# Patient Record
Sex: Female | Born: 1940 | Race: White | Hispanic: No | Marital: Married | Smoking: Current some day smoker
Health system: Southern US, Community
[De-identification: ages and names within clinical notes are randomized; demographics above are authoritative.]

## PROBLEM LIST (undated history)

## (undated) DIAGNOSIS — E278 Other specified disorders of adrenal gland: Secondary | ICD-10-CM

## (undated) DIAGNOSIS — IMO0002 Reserved for concepts with insufficient information to code with codable children: Secondary | ICD-10-CM

## (undated) DIAGNOSIS — I10 Essential (primary) hypertension: Secondary | ICD-10-CM

## (undated) DIAGNOSIS — J15212 Pneumonia due to Methicillin resistant Staphylococcus aureus: Secondary | ICD-10-CM

## (undated) DIAGNOSIS — C50911 Malignant neoplasm of unspecified site of right female breast: Secondary | ICD-10-CM

## (undated) DIAGNOSIS — C189 Malignant neoplasm of colon, unspecified: Secondary | ICD-10-CM

## (undated) DIAGNOSIS — E785 Hyperlipidemia, unspecified: Secondary | ICD-10-CM

## (undated) DIAGNOSIS — F419 Anxiety disorder, unspecified: Secondary | ICD-10-CM

## (undated) DIAGNOSIS — I509 Heart failure, unspecified: Secondary | ICD-10-CM

## (undated) DIAGNOSIS — J45909 Unspecified asthma, uncomplicated: Secondary | ICD-10-CM

## (undated) DIAGNOSIS — M810 Age-related osteoporosis without current pathological fracture: Secondary | ICD-10-CM

## (undated) DIAGNOSIS — C50919 Malignant neoplasm of unspecified site of unspecified female breast: Secondary | ICD-10-CM

## (undated) DIAGNOSIS — J449 Chronic obstructive pulmonary disease, unspecified: Secondary | ICD-10-CM

## (undated) DIAGNOSIS — J309 Allergic rhinitis, unspecified: Secondary | ICD-10-CM

## (undated) DIAGNOSIS — E538 Deficiency of other specified B group vitamins: Secondary | ICD-10-CM

## (undated) HISTORY — PX: ABDOMINAL HYSTERECTOMY: SHX81

## (undated) HISTORY — PX: IMAGE GUIDED SINUS SURGERY: SHX6570

## (undated) HISTORY — PX: BREAST SURGERY: SHX581

## (undated) HISTORY — PX: OTHER SURGICAL HISTORY: SHX169

## (undated) HISTORY — PX: APPENDECTOMY: SHX54

---

## 1997-11-18 DIAGNOSIS — C50911 Malignant neoplasm of unspecified site of right female breast: Secondary | ICD-10-CM

## 1997-11-18 DIAGNOSIS — C50919 Malignant neoplasm of unspecified site of unspecified female breast: Secondary | ICD-10-CM

## 1997-11-18 DIAGNOSIS — IMO0001 Reserved for inherently not codable concepts without codable children: Secondary | ICD-10-CM

## 1997-11-18 HISTORY — DX: Reserved for inherently not codable concepts without codable children: IMO0001

## 1997-11-18 HISTORY — DX: Malignant neoplasm of unspecified site of right female breast: C50.911

## 1997-11-18 HISTORY — DX: Malignant neoplasm of unspecified site of unspecified female breast: C50.919

## 2005-05-21 ENCOUNTER — Emergency Department: Payer: Self-pay | Admitting: Emergency Medicine

## 2007-04-15 ENCOUNTER — Ambulatory Visit: Payer: Self-pay | Admitting: Unknown Physician Specialty

## 2007-04-20 ENCOUNTER — Ambulatory Visit: Payer: Self-pay | Admitting: Unknown Physician Specialty

## 2007-05-05 ENCOUNTER — Ambulatory Visit: Payer: Self-pay | Admitting: Cardiology

## 2007-05-19 ENCOUNTER — Ambulatory Visit: Payer: Self-pay | Admitting: Oncology

## 2007-05-20 ENCOUNTER — Inpatient Hospital Stay: Payer: Self-pay | Admitting: Surgery

## 2007-06-05 ENCOUNTER — Ambulatory Visit: Payer: Self-pay | Admitting: Oncology

## 2007-06-19 ENCOUNTER — Ambulatory Visit: Payer: Self-pay | Admitting: Oncology

## 2007-06-29 ENCOUNTER — Ambulatory Visit: Payer: Self-pay | Admitting: Oncology

## 2007-07-16 ENCOUNTER — Ambulatory Visit: Payer: Self-pay | Admitting: Surgery

## 2007-07-20 ENCOUNTER — Ambulatory Visit: Payer: Self-pay | Admitting: Oncology

## 2007-08-19 ENCOUNTER — Ambulatory Visit: Payer: Self-pay | Admitting: Oncology

## 2007-09-19 ENCOUNTER — Ambulatory Visit: Payer: Self-pay | Admitting: Oncology

## 2007-10-01 ENCOUNTER — Ambulatory Visit: Payer: Self-pay | Admitting: Internal Medicine

## 2007-10-09 ENCOUNTER — Ambulatory Visit: Payer: Self-pay | Admitting: Internal Medicine

## 2007-11-17 ENCOUNTER — Ambulatory Visit: Payer: Self-pay | Admitting: Urology

## 2007-11-19 ENCOUNTER — Ambulatory Visit: Payer: Self-pay | Admitting: Oncology

## 2007-11-27 ENCOUNTER — Ambulatory Visit: Payer: Self-pay | Admitting: Oncology

## 2007-12-20 ENCOUNTER — Ambulatory Visit: Payer: Self-pay | Admitting: Oncology

## 2007-12-21 ENCOUNTER — Ambulatory Visit: Payer: Self-pay | Admitting: Oncology

## 2008-01-11 ENCOUNTER — Ambulatory Visit: Payer: Self-pay | Admitting: Internal Medicine

## 2008-01-17 ENCOUNTER — Ambulatory Visit: Payer: Self-pay | Admitting: Oncology

## 2008-03-18 ENCOUNTER — Ambulatory Visit: Payer: Self-pay | Admitting: Oncology

## 2008-03-21 ENCOUNTER — Ambulatory Visit: Payer: Self-pay | Admitting: Oncology

## 2008-04-18 ENCOUNTER — Ambulatory Visit: Payer: Self-pay | Admitting: Oncology

## 2008-07-11 ENCOUNTER — Ambulatory Visit: Payer: Self-pay | Admitting: Unknown Physician Specialty

## 2008-08-18 ENCOUNTER — Ambulatory Visit: Payer: Self-pay | Admitting: Oncology

## 2008-09-16 ENCOUNTER — Ambulatory Visit: Payer: Self-pay | Admitting: Oncology

## 2008-09-18 ENCOUNTER — Ambulatory Visit: Payer: Self-pay | Admitting: Oncology

## 2008-11-16 ENCOUNTER — Ambulatory Visit: Payer: Self-pay | Admitting: Cardiology

## 2008-11-18 DIAGNOSIS — C189 Malignant neoplasm of colon, unspecified: Secondary | ICD-10-CM

## 2008-11-18 HISTORY — DX: Malignant neoplasm of colon, unspecified: C18.9

## 2009-01-18 ENCOUNTER — Ambulatory Visit: Payer: Self-pay | Admitting: Oncology

## 2009-03-18 ENCOUNTER — Ambulatory Visit: Payer: Self-pay | Admitting: Oncology

## 2009-03-23 ENCOUNTER — Ambulatory Visit: Payer: Self-pay | Admitting: Oncology

## 2009-04-18 ENCOUNTER — Ambulatory Visit: Payer: Self-pay | Admitting: Oncology

## 2009-10-18 ENCOUNTER — Ambulatory Visit: Payer: Self-pay | Admitting: Oncology

## 2009-10-20 ENCOUNTER — Ambulatory Visit: Payer: Self-pay | Admitting: Oncology

## 2009-11-18 ENCOUNTER — Ambulatory Visit: Payer: Self-pay | Admitting: Oncology

## 2010-01-31 ENCOUNTER — Ambulatory Visit: Payer: Self-pay | Admitting: Internal Medicine

## 2010-02-10 ENCOUNTER — Inpatient Hospital Stay: Payer: Self-pay | Admitting: Specialist

## 2010-04-18 ENCOUNTER — Ambulatory Visit: Payer: Self-pay | Admitting: Oncology

## 2010-05-02 ENCOUNTER — Ambulatory Visit: Payer: Self-pay | Admitting: Oncology

## 2010-05-18 ENCOUNTER — Ambulatory Visit: Payer: Self-pay | Admitting: Oncology

## 2010-09-10 ENCOUNTER — Ambulatory Visit: Payer: Self-pay | Admitting: Unknown Physician Specialty

## 2010-09-12 LAB — PATHOLOGY REPORT

## 2010-09-24 ENCOUNTER — Inpatient Hospital Stay: Payer: Self-pay | Admitting: Internal Medicine

## 2010-12-14 ENCOUNTER — Ambulatory Visit: Payer: Self-pay | Admitting: Oncology

## 2010-12-19 ENCOUNTER — Ambulatory Visit: Payer: Self-pay | Admitting: Oncology

## 2011-02-05 ENCOUNTER — Ambulatory Visit: Payer: Self-pay | Admitting: Oncology

## 2011-06-14 ENCOUNTER — Ambulatory Visit: Payer: Self-pay | Admitting: Oncology

## 2011-06-19 ENCOUNTER — Ambulatory Visit: Payer: Self-pay | Admitting: Oncology

## 2011-08-29 ENCOUNTER — Emergency Department: Payer: Self-pay | Admitting: *Deleted

## 2011-08-30 ENCOUNTER — Inpatient Hospital Stay: Payer: Self-pay | Admitting: Internal Medicine

## 2012-01-14 ENCOUNTER — Ambulatory Visit: Payer: Self-pay | Admitting: Oncology

## 2012-01-14 LAB — CBC CANCER CENTER
Eosinophil #: 0.1 x10 3/mm (ref 0.0–0.7)
Eosinophil %: 1.8 %
MCH: 28.5 pg (ref 26.0–34.0)
MCHC: 33.9 g/dL (ref 32.0–36.0)
Monocyte #: 0.5 x10 3/mm (ref 0.0–0.7)
Neutrophil %: 54.6 %
Platelet: 245 x10 3/mm (ref 150–440)
RDW: 14.1 % (ref 11.5–14.5)
WBC: 7.8 x10 3/mm (ref 3.6–11.0)

## 2012-01-14 LAB — COMPREHENSIVE METABOLIC PANEL
Albumin: 3.7 g/dL (ref 3.4–5.0)
Alkaline Phosphatase: 101 U/L (ref 50–136)
Anion Gap: 10 (ref 7–16)
Calcium, Total: 9.1 mg/dL (ref 8.5–10.1)
Co2: 28 mmol/L (ref 21–32)
EGFR (African American): >60
EGFR (Non-African Amer.): 58 — ABNORMAL LOW
Glucose: 104 mg/dL — ABNORMAL HIGH (ref 65–99)
Osmolality: 279 (ref 275–301)
SGOT(AST): 17 U/L (ref 15–37)
Sodium: 140 mmol/L (ref 136–145)

## 2012-01-15 LAB — CEA: CEA: 2.7 ng/mL (ref 0.0–4.7)

## 2012-01-17 ENCOUNTER — Ambulatory Visit: Payer: Self-pay | Admitting: Oncology

## 2012-02-17 ENCOUNTER — Ambulatory Visit: Payer: Self-pay | Admitting: Oncology

## 2012-03-11 ENCOUNTER — Ambulatory Visit: Payer: Self-pay | Admitting: Cardiology

## 2012-04-03 ENCOUNTER — Inpatient Hospital Stay: Payer: Self-pay | Admitting: Internal Medicine

## 2012-04-03 LAB — BASIC METABOLIC PANEL
Co2: 26 mmol/L (ref 21–32)
Creatinine: 0.89 mg/dL (ref 0.60–1.30)
EGFR (African American): >60
Glucose: 104 mg/dL — ABNORMAL HIGH (ref 65–99)
Osmolality: 271 (ref 275–301)
Potassium: 4 mmol/L (ref 3.5–5.1)

## 2012-04-03 LAB — CBC WITH DIFFERENTIAL/PLATELET
Eosinophil %: 0.3 %
HCT: 32.7 % — ABNORMAL LOW (ref 35.0–47.0)
HGB: 11 g/dL — ABNORMAL LOW (ref 12.0–16.0)
Lymphocyte #: 1 10*3/uL (ref 1.0–3.6)
Lymphocyte %: 6 %
MCV: 86 fL (ref 80–100)
Monocyte #: 1.2 x10 3/mm — ABNORMAL HIGH (ref 0.2–0.9)
Monocyte %: 7.3 %
Neutrophil #: 13.7 10*3/uL — ABNORMAL HIGH (ref 1.4–6.5)
Platelet: 218 10*3/uL (ref 150–440)
RBC: 3.8 10*6/uL (ref 3.80–5.20)
WBC: 15.9 10*3/uL — ABNORMAL HIGH (ref 3.6–11.0)

## 2012-04-03 LAB — URINALYSIS, COMPLETE
Bacteria: NONE SEEN
Bilirubin,UR: NEGATIVE
Glucose,UR: NEGATIVE mg/dL (ref 0–75)
Ketone: NEGATIVE
Leukocyte Esterase: NEGATIVE
Protein: NEGATIVE
RBC,UR: NONE SEEN /HPF (ref 0–5)
Specific Gravity: 1.011 (ref 1.003–1.030)
WBC UR: NONE SEEN /HPF (ref 0–5)

## 2012-04-03 LAB — TROPONIN I: Troponin-I: <0.02 ng/mL

## 2012-04-04 LAB — CBC WITH DIFFERENTIAL/PLATELET
Basophil #: 0 10*3/uL (ref 0.0–0.1)
Basophil %: 0.1 %
Eosinophil #: 0 10*3/uL (ref 0.0–0.7)
Eosinophil %: 0 %
HCT: 31.7 % — ABNORMAL LOW (ref 35.0–47.0)
Lymphocyte %: 3.4 %
MCHC: 33.2 g/dL (ref 32.0–36.0)
MCV: 86 fL (ref 80–100)
Monocyte #: 0.2 x10 3/mm (ref 0.2–0.9)
Monocyte %: 1.4 %
Neutrophil #: 11.2 10*3/uL — ABNORMAL HIGH (ref 1.4–6.5)
Neutrophil %: 95.1 %
RDW: 14 % (ref 11.5–14.5)

## 2012-04-04 LAB — BASIC METABOLIC PANEL
Anion Gap: 9 (ref 7–16)
BUN: 14 mg/dL (ref 7–18)
Calcium, Total: 8.7 mg/dL (ref 8.5–10.1)
Chloride: 98 mmol/L (ref 98–107)
Co2: 27 mmol/L (ref 21–32)
Creatinine: 0.85 mg/dL (ref 0.60–1.30)
EGFR (African American): >60
EGFR (Non-African Amer.): >60
Glucose: 162 mg/dL — ABNORMAL HIGH (ref 65–99)
Sodium: 134 mmol/L — ABNORMAL LOW (ref 136–145)

## 2012-04-04 LAB — TROPONIN I
Troponin-I: <0.02 ng/mL
Troponin-I: <0.02 ng/mL

## 2012-04-09 LAB — CULTURE, BLOOD (SINGLE)

## 2012-07-22 ENCOUNTER — Ambulatory Visit: Payer: Self-pay | Admitting: Oncology

## 2012-07-22 LAB — COMPREHENSIVE METABOLIC PANEL
Albumin: 3.8 g/dL (ref 3.4–5.0)
Alkaline Phosphatase: 80 U/L (ref 50–136)
Calcium, Total: 9 mg/dL (ref 8.5–10.1)
Co2: 33 mmol/L — ABNORMAL HIGH (ref 21–32)
EGFR (Non-African Amer.): 57 — ABNORMAL LOW
Glucose: 97 mg/dL (ref 65–99)
Osmolality: 275 (ref 275–301)
SGOT(AST): 18 U/L (ref 15–37)
SGPT (ALT): 28 U/L (ref 12–78)
Sodium: 138 mmol/L (ref 136–145)
Total Protein: 7.3 g/dL (ref 6.4–8.2)

## 2012-07-22 LAB — CBC CANCER CENTER
Basophil #: 0.1 x10 3/mm (ref 0.0–0.1)
Basophil %: 0.7 %
Eosinophil #: 0.2 x10 3/mm (ref 0.0–0.7)
HCT: 41.8 % (ref 35.0–47.0)
Lymphocyte #: 2 x10 3/mm (ref 1.0–3.6)
Lymphocyte %: 19.7 %
MCH: 28.4 pg (ref 26.0–34.0)
MCHC: 32.7 g/dL (ref 32.0–36.0)
MCV: 87 fL (ref 80–100)
Monocyte %: 9.2 %
Neutrophil #: 7.1 x10 3/mm — ABNORMAL HIGH (ref 1.4–6.5)
RDW: 14.2 % (ref 11.5–14.5)

## 2012-07-24 LAB — CEA: CEA: 2.8 ng/mL (ref 0.0–4.7)

## 2012-08-18 ENCOUNTER — Ambulatory Visit: Payer: Self-pay | Admitting: Oncology

## 2012-08-31 ENCOUNTER — Ambulatory Visit: Payer: Self-pay | Admitting: Internal Medicine

## 2012-11-16 ENCOUNTER — Ambulatory Visit: Payer: Self-pay | Admitting: Physical Medicine and Rehabilitation

## 2013-01-28 ENCOUNTER — Inpatient Hospital Stay: Payer: Self-pay | Admitting: Internal Medicine

## 2013-01-28 LAB — COMPREHENSIVE METABOLIC PANEL
Alkaline Phosphatase: 98 U/L (ref 50–136)
BUN: 9 mg/dL (ref 7–18)
Bilirubin,Total: 0.2 mg/dL (ref 0.2–1.0)
Calcium, Total: 8.8 mg/dL (ref 8.5–10.1)
Chloride: 105 mmol/L (ref 98–107)
Creatinine: 1.12 mg/dL (ref 0.60–1.30)
EGFR (Non-African Amer.): 50 — ABNORMAL LOW
Glucose: 102 mg/dL — ABNORMAL HIGH (ref 65–99)
Potassium: 3.7 mmol/L (ref 3.5–5.1)
SGOT(AST): 19 U/L (ref 15–37)
SGPT (ALT): 21 U/L (ref 12–78)
Sodium: 141 mmol/L (ref 136–145)

## 2013-01-28 LAB — CBC
HGB: 14.8 g/dL (ref 12.0–16.0)
MCHC: 33.5 g/dL (ref 32.0–36.0)
Platelet: 243 10*3/uL (ref 150–440)
RBC: 5.22 10*6/uL — ABNORMAL HIGH (ref 3.80–5.20)
RDW: 14.2 % (ref 11.5–14.5)
WBC: 9.7 10*3/uL (ref 3.6–11.0)

## 2013-01-28 LAB — CK TOTAL AND CKMB (NOT AT ARMC): CK, Total: 61 U/L (ref 21–215)

## 2013-03-08 ENCOUNTER — Ambulatory Visit: Payer: Self-pay | Admitting: Otolaryngology

## 2013-03-10 LAB — PATHOLOGY REPORT

## 2013-06-01 ENCOUNTER — Ambulatory Visit: Payer: Self-pay | Admitting: Internal Medicine

## 2013-08-10 ENCOUNTER — Ambulatory Visit: Payer: Self-pay | Admitting: Oncology

## 2013-08-10 LAB — COMPREHENSIVE METABOLIC PANEL
Albumin: 3.7 g/dL (ref 3.4–5.0)
Anion Gap: 8 (ref 7–16)
BUN: 8 mg/dL (ref 7–18)
Calcium, Total: 9.3 mg/dL (ref 8.5–10.1)
Chloride: 101 mmol/L (ref 98–107)
Co2: 31 mmol/L (ref 21–32)
Creatinine: 0.93 mg/dL (ref 0.60–1.30)
EGFR (African American): >60
EGFR (Non-African Amer.): >60
Osmolality: 277 (ref 275–301)
Potassium: 4.2 mmol/L (ref 3.5–5.1)
Sodium: 140 mmol/L (ref 136–145)

## 2013-08-10 LAB — CBC CANCER CENTER
Basophil #: 0.1 x10 3/mm (ref 0.0–0.1)
Basophil %: 1.1 %
Eosinophil #: 0.2 x10 3/mm (ref 0.0–0.7)
Eosinophil %: 2.3 %
HCT: 44.1 % (ref 35.0–47.0)
Lymphocyte %: 29 %
MCHC: 33 g/dL (ref 32.0–36.0)
Monocyte #: 0.8 x10 3/mm (ref 0.2–0.9)
Monocyte %: 8.9 %
Platelet: 265 x10 3/mm (ref 150–440)
RBC: 5.23 10*6/uL — ABNORMAL HIGH (ref 3.80–5.20)

## 2013-08-12 LAB — CEA: CEA: 2.9 ng/mL (ref 0.0–4.7)

## 2013-08-18 ENCOUNTER — Ambulatory Visit: Payer: Self-pay | Admitting: Oncology

## 2013-09-15 ENCOUNTER — Ambulatory Visit: Payer: Self-pay | Admitting: Unknown Physician Specialty

## 2013-09-17 LAB — PATHOLOGY REPORT

## 2013-11-11 ENCOUNTER — Inpatient Hospital Stay: Payer: Self-pay | Admitting: Internal Medicine

## 2013-11-11 LAB — URINALYSIS, COMPLETE
Bilirubin,UR: NEGATIVE
Glucose,UR: NEGATIVE mg/dL (ref 0–75)
Nitrite: NEGATIVE
Ph: 5 (ref 4.5–8.0)
Protein: NEGATIVE
RBC,UR: <1 /HPF (ref 0–5)
WBC UR: 1 /HPF (ref 0–5)

## 2013-11-11 LAB — COMPREHENSIVE METABOLIC PANEL
Alkaline Phosphatase: 79 U/L
BUN: 13 mg/dL (ref 7–18)
Chloride: 100 mmol/L (ref 98–107)
Co2: 31 mmol/L (ref 21–32)
Creatinine: 1.15 mg/dL (ref 0.60–1.30)
EGFR (Non-African Amer.): 48 — ABNORMAL LOW
Glucose: 119 mg/dL — ABNORMAL HIGH (ref 65–99)
Potassium: 3.9 mmol/L (ref 3.5–5.1)
SGOT(AST): 23 U/L (ref 15–37)
SGPT (ALT): 18 U/L (ref 12–78)

## 2013-11-11 LAB — CBC WITH DIFFERENTIAL/PLATELET
Basophil #: 0.1 10*3/uL (ref 0.0–0.1)
Basophil %: 0.3 %
Eosinophil %: 0.5 %
HGB: 13.7 g/dL (ref 12.0–16.0)
Lymphocyte #: 1.3 10*3/uL (ref 1.0–3.6)
MCH: 28.1 pg (ref 26.0–34.0)
Monocyte %: 7.1 %
Neutrophil #: 18.9 10*3/uL — ABNORMAL HIGH (ref 1.4–6.5)
Neutrophil %: 86.1 %
WBC: 22 10*3/uL — ABNORMAL HIGH (ref 3.6–11.0)

## 2013-11-11 LAB — RAPID INFLUENZA A&B ANTIGENS

## 2013-11-11 LAB — TROPONIN I: Troponin-I: <0.02 ng/mL

## 2013-11-12 LAB — BASIC METABOLIC PANEL
Anion Gap: 3 — ABNORMAL LOW (ref 7–16)
BUN: 13 mg/dL (ref 7–18)
Calcium, Total: 8.7 mg/dL (ref 8.5–10.1)
Co2: 31 mmol/L (ref 21–32)
Creatinine: 0.94 mg/dL (ref 0.60–1.30)
EGFR (Non-African Amer.): >60
Osmolality: 266 (ref 275–301)
Sodium: 133 mmol/L — ABNORMAL LOW (ref 136–145)

## 2013-11-12 LAB — CBC WITH DIFFERENTIAL/PLATELET
Basophil #: 0.1 10*3/uL (ref 0.0–0.1)
Basophil %: 0.4 %
Eosinophil %: 0.1 %
HCT: 37.1 % (ref 35.0–47.0)
Lymphocyte #: 1.7 10*3/uL (ref 1.0–3.6)
MCH: 28.1 pg (ref 26.0–34.0)
MCV: 84 fL (ref 80–100)
Neutrophil #: 15.5 10*3/uL — ABNORMAL HIGH (ref 1.4–6.5)
Neutrophil %: 83.5 %
RBC: 4.42 10*6/uL (ref 3.80–5.20)

## 2013-11-13 LAB — CBC WITH DIFFERENTIAL/PLATELET
Basophil #: 0 x10 3/mm 3
Basophil %: 0.2 %
Eosinophil #: 0.1 x10 3/mm 3
Eosinophil %: 0.5 %
HCT: 36.7 %
HGB: 12.3 g/dL
Lymphocyte %: 11.5 %
Lymphs Abs: 1.5 x10 3/mm 3
MCH: 28.1 pg
MCHC: 33.5 g/dL
MCV: 84 fL
Monocyte #: 1.1 "x10 3/mm " — ABNORMAL HIGH
Monocyte %: 8.3 %
Neutrophil #: 10.4 x10 3/mm 3 — ABNORMAL HIGH
Neutrophil %: 79.5 %
Platelet: 233 x10 3/mm 3
RBC: 4.37 X10 6/mm 3
RDW: 14 %
WBC: 13 x10 3/mm 3 — ABNORMAL HIGH

## 2013-11-13 LAB — BASIC METABOLIC PANEL WITH GFR
Anion Gap: 2 — ABNORMAL LOW
BUN: 15 mg/dL
Calcium, Total: 8.8 mg/dL
Chloride: 99 mmol/L
Co2: 32 mmol/L
Creatinine: 0.81 mg/dL
EGFR (African American): >60
EGFR (Non-African Amer.): >60
Glucose: 101 mg/dL — ABNORMAL HIGH
Osmolality: 267
Potassium: 3.7 mmol/L
Sodium: 133 mmol/L — ABNORMAL LOW

## 2013-11-13 LAB — HCG, QUANTITATIVE, PREGNANCY: Beta Hcg, Quant.: 3 m[IU]/mL

## 2013-11-16 LAB — CULTURE, BLOOD (SINGLE)

## 2014-02-03 ENCOUNTER — Ambulatory Visit: Payer: Self-pay | Admitting: Cardiothoracic Surgery

## 2014-02-03 ENCOUNTER — Ambulatory Visit: Payer: Self-pay | Admitting: Oncology

## 2014-02-16 ENCOUNTER — Ambulatory Visit: Payer: Self-pay | Admitting: Oncology

## 2014-04-11 ENCOUNTER — Emergency Department: Payer: Self-pay | Admitting: Emergency Medicine

## 2014-04-11 LAB — CBC
HCT: 43 % (ref 35.0–47.0)
HGB: 14.5 g/dL (ref 12.0–16.0)
MCH: 28.7 pg (ref 26.0–34.0)
MCHC: 33.6 g/dL (ref 32.0–36.0)
MCV: 85 fL (ref 80–100)
Platelet: 222 10*3/uL (ref 150–440)
RBC: 5.04 10*6/uL (ref 3.80–5.20)
RDW: 14 % (ref 11.5–14.5)
WBC: 10.3 10*3/uL (ref 3.6–11.0)

## 2014-04-11 LAB — COMPREHENSIVE METABOLIC PANEL
ALK PHOS: 64 U/L
ALT: 21 U/L (ref 12–78)
ANION GAP: 5 — AB (ref 7–16)
Albumin: 3.9 g/dL (ref 3.4–5.0)
BILIRUBIN TOTAL: 0.6 mg/dL (ref 0.2–1.0)
BUN: 9 mg/dL (ref 7–18)
CALCIUM: 9 mg/dL (ref 8.5–10.1)
CHLORIDE: 100 mmol/L (ref 98–107)
CO2: 30 mmol/L (ref 21–32)
CREATININE: 0.76 mg/dL (ref 0.60–1.30)
GLUCOSE: 134 mg/dL — AB (ref 65–99)
Osmolality: 271 (ref 275–301)
POTASSIUM: 3.8 mmol/L (ref 3.5–5.1)
SGOT(AST): 28 U/L (ref 15–37)
Sodium: 135 mmol/L — ABNORMAL LOW (ref 136–145)
TOTAL PROTEIN: 7.4 g/dL (ref 6.4–8.2)

## 2014-04-11 LAB — URINALYSIS, COMPLETE
BILIRUBIN, UR: NEGATIVE
Bacteria: NONE SEEN
Glucose,UR: NEGATIVE mg/dL (ref 0–75)
Ketone: NEGATIVE
Leukocyte Esterase: NEGATIVE
NITRITE: NEGATIVE
PH: 5 (ref 4.5–8.0)
Protein: NEGATIVE
RBC,UR: 4 /HPF (ref 0–5)
Specific Gravity: 1.02 (ref 1.003–1.030)
WBC UR: 6 /HPF (ref 0–5)

## 2014-04-11 LAB — CK TOTAL AND CKMB (NOT AT ARMC)
CK, Total: 78 U/L
CK-MB: 1.5 ng/mL (ref 0.5–3.6)

## 2014-04-11 LAB — TROPONIN I

## 2014-07-27 ENCOUNTER — Ambulatory Visit: Payer: Self-pay | Admitting: Family Medicine

## 2014-08-24 ENCOUNTER — Ambulatory Visit: Payer: Self-pay | Admitting: Oncology

## 2014-08-24 LAB — COMPREHENSIVE METABOLIC PANEL
ALT: 23 U/L
AST: 17 U/L (ref 15–37)
Albumin: 3.7 g/dL (ref 3.4–5.0)
Alkaline Phosphatase: 69 U/L
Anion Gap: 6 — ABNORMAL LOW (ref 7–16)
BUN: 10 mg/dL (ref 7–18)
Bilirubin,Total: 0.2 mg/dL (ref 0.2–1.0)
CHLORIDE: 104 mmol/L (ref 98–107)
CO2: 31 mmol/L (ref 21–32)
CREATININE: 0.85 mg/dL (ref 0.60–1.30)
Calcium, Total: 9 mg/dL (ref 8.5–10.1)
EGFR (African American): >60
EGFR (Non-African Amer.): >60
GLUCOSE: 123 mg/dL — AB (ref 65–99)
Osmolality: 282 (ref 275–301)
Potassium: 4.2 mmol/L (ref 3.5–5.1)
Sodium: 141 mmol/L (ref 136–145)
TOTAL PROTEIN: 6.6 g/dL (ref 6.4–8.2)

## 2014-08-24 LAB — CBC CANCER CENTER
BASOS PCT: 1.1 %
Basophil #: 0.1 x10 3/mm (ref 0.0–0.1)
EOS ABS: 0.2 x10 3/mm (ref 0.0–0.7)
EOS PCT: 2.3 %
HCT: 42 % (ref 35.0–47.0)
HGB: 13.6 g/dL (ref 12.0–16.0)
LYMPHS PCT: 24.1 %
Lymphocyte #: 1.8 x10 3/mm (ref 1.0–3.6)
MCH: 28.1 pg (ref 26.0–34.0)
MCHC: 32.3 g/dL (ref 32.0–36.0)
MCV: 87 fL (ref 80–100)
MONOS PCT: 7.4 %
Monocyte #: 0.6 x10 3/mm (ref 0.2–0.9)
NEUTROS PCT: 65.1 %
Neutrophil #: 4.9 x10 3/mm (ref 1.4–6.5)
Platelet: 208 x10 3/mm (ref 150–440)
RBC: 4.83 10*6/uL (ref 3.80–5.20)
RDW: 13.9 % (ref 11.5–14.5)
WBC: 7.6 x10 3/mm (ref 3.6–11.0)

## 2014-08-25 LAB — CEA: CEA: 4.1 ng/mL (ref 0.0–4.7)

## 2014-09-14 ENCOUNTER — Ambulatory Visit: Payer: Self-pay | Admitting: Ophthalmology

## 2014-09-18 ENCOUNTER — Ambulatory Visit: Payer: Self-pay | Admitting: Oncology

## 2014-10-05 ENCOUNTER — Ambulatory Visit: Payer: Self-pay | Admitting: Ophthalmology

## 2014-12-15 ENCOUNTER — Observation Stay (HOSPITAL_COMMUNITY)
Admission: AD | Admit: 2014-12-15 | Discharge: 2014-12-15 | Disposition: A | Discharge status: Home / Self Care | Payer: PPO | Source: Other Acute Inpatient Hospital | Attending: Neurological Surgery | Admitting: Neurological Surgery

## 2014-12-15 ENCOUNTER — Emergency Department: Payer: Self-pay | Admitting: Internal Medicine

## 2014-12-15 ENCOUNTER — Encounter (HOSPITAL_COMMUNITY): Payer: Self-pay | Admitting: Neurological Surgery

## 2014-12-15 DIAGNOSIS — Y9289 Other specified places as the place of occurrence of the external cause: Secondary | ICD-10-CM | POA: Insufficient documentation

## 2014-12-15 DIAGNOSIS — S0990XA Unspecified injury of head, initial encounter: Secondary | ICD-10-CM | POA: Diagnosis present

## 2014-12-15 DIAGNOSIS — Y9389 Activity, other specified: Secondary | ICD-10-CM | POA: Diagnosis not present

## 2014-12-15 DIAGNOSIS — Y998 Other external cause status: Secondary | ICD-10-CM | POA: Insufficient documentation

## 2014-12-15 DIAGNOSIS — S0512XA Contusion of eyeball and orbital tissues, left eye, initial encounter: Secondary | ICD-10-CM | POA: Insufficient documentation

## 2014-12-15 DIAGNOSIS — W1809XA Striking against other object with subsequent fall, initial encounter: Secondary | ICD-10-CM | POA: Diagnosis not present

## 2014-12-15 LAB — COMPREHENSIVE METABOLIC PANEL
ANION GAP: 5 — AB (ref 7–16)
AST: 27 U/L (ref 15–37)
Albumin: 3.8 g/dL (ref 3.4–5.0)
Alkaline Phosphatase: 67 U/L (ref 46–116)
BUN: 10 mg/dL (ref 7–18)
Bilirubin,Total: 0.3 mg/dL (ref 0.2–1.0)
CALCIUM: 8.9 mg/dL (ref 8.5–10.1)
Chloride: 103 mmol/L (ref 98–107)
Co2: 32 mmol/L (ref 21–32)
Creatinine: 0.76 mg/dL (ref 0.60–1.30)
EGFR (African American): >60
EGFR (Non-African Amer.): >60
Glucose: 98 mg/dL (ref 65–99)
Osmolality: 278 (ref 275–301)
POTASSIUM: 3.9 mmol/L (ref 3.5–5.1)
SGPT (ALT): 20 U/L (ref 14–63)
Sodium: 140 mmol/L (ref 136–145)
Total Protein: 6.9 g/dL (ref 6.4–8.2)

## 2014-12-15 LAB — CBC
HCT: 43.4 % (ref 35.0–47.0)
HGB: 13.9 g/dL (ref 12.0–16.0)
MCH: 27.5 pg (ref 26.0–34.0)
MCHC: 31.9 g/dL — AB (ref 32.0–36.0)
MCV: 86 fL (ref 80–100)
Platelet: 213 10*3/uL (ref 150–440)
RBC: 5.03 10*6/uL (ref 3.80–5.20)
RDW: 14 % (ref 11.5–14.5)
WBC: 9.7 10*3/uL (ref 3.6–11.0)

## 2014-12-15 LAB — PROTIME-INR
INR: 0.9
Prothrombin Time: 12.5 secs (ref 11.5–14.7)

## 2014-12-15 NOTE — Progress Notes (Signed)
Patient discharged home with son and husband. Discharge instructions reviewed with patient. Patient and family state they understand both.

## 2014-12-15 NOTE — Discharge Instructions (Signed)
Concussion  A concussion, or closed-head injury, is a brain injury caused by a direct blow to the head or by a quick and sudden movement (jolt) of the head or neck. Concussions are usually not life-threatening. Even so, the effects of a concussion can be serious. If you have had a concussion before, you are more likely to experience concussion-like symptoms after a direct blow to the head.   CAUSES  · Direct blow to the head, such as from running into another player during a soccer game, being hit in a fight, or hitting your head on a hard surface.  · A jolt of the head or neck that causes the brain to move back and forth inside the skull, such as in a car crash.  SIGNS AND SYMPTOMS  The signs of a concussion can be hard to notice. Early on, they may be missed by you, family members, and health care providers. You may look fine but act or feel differently.  Symptoms are usually temporary, but they may last for days, weeks, or even longer. Some symptoms may appear right away while others may not show up for hours or days. Every head injury is different. Symptoms include:  · Mild to moderate headaches that will not go away.  · A feeling of pressure inside your head.  · Having more trouble than usual:  ¨ Learning or remembering things you have heard.  ¨ Answering questions.  ¨ Paying attention or concentrating.  ¨ Organizing daily tasks.  ¨ Making decisions and solving problems.  · Slowness in thinking, acting or reacting, speaking, or reading.  · Getting lost or being easily confused.  · Feeling tired all the time or lacking energy (fatigued).  · Feeling drowsy.  · Sleep disturbances.  ¨ Sleeping more than usual.  ¨ Sleeping less than usual.  ¨ Trouble falling asleep.  ¨ Trouble sleeping (insomnia).  · Loss of balance or feeling lightheaded or dizzy.  · Nausea or vomiting.  · Numbness or tingling.  · Increased sensitivity to:  ¨ Sounds.  ¨ Lights.  ¨ Distractions.  · Vision problems or eyes that tire  easily.  · Diminished sense of taste or smell.  · Ringing in the ears.  · Mood changes such as feeling sad or anxious.  · Becoming easily irritated or angry for little or no reason.  · Lack of motivation.  · Seeing or hearing things other people do not see or hear (hallucinations).  DIAGNOSIS  Your health care provider can usually diagnose a concussion based on a description of your injury and symptoms. He or she will ask whether you passed out (lost consciousness) and whether you are having trouble remembering events that happened right before and during your injury.  Your evaluation might include:  · A brain scan to look for signs of injury to the brain. Even if the test shows no injury, you may still have a concussion.  · Blood tests to be sure other problems are not present.  TREATMENT  · Concussions are usually treated in an emergency department, in urgent care, or at a clinic. You may need to stay in the hospital overnight for further treatment.  · Tell your health care provider if you are taking any medicines, including prescription medicines, over-the-counter medicines, and natural remedies. Some medicines, such as blood thinners (anticoagulants) and aspirin, may increase the chance of complications. Also tell your health care provider whether you have had alcohol or are taking illegal drugs. This information   may affect treatment.  · Your health care provider will send you home with important instructions to follow.  · How fast you will recover from a concussion depends on many factors. These factors include how severe your concussion is, what part of your brain was injured, your age, and how healthy you were before the concussion.  · Most people with mild injuries recover fully. Recovery can take time. In general, recovery is slower in older persons. Also, persons who have had a concussion in the past or have other medical problems may find that it takes longer to recover from their current injury.  HOME  CARE INSTRUCTIONS  General Instructions  · Carefully follow the directions your health care provider gave you.  · Only take over-the-counter or prescription medicines for pain, discomfort, or fever as directed by your health care provider.  · Take only those medicines that your health care provider has approved.  · Do not drink alcohol until your health care provider says you are well enough to do so. Alcohol and certain other drugs may slow your recovery and can put you at risk of further injury.  · If it is harder than usual to remember things, write them down.  · If you are easily distracted, try to do one thing at a time. For example, do not try to watch TV while fixing dinner.  · Talk with family members or close friends when making important decisions.  · Keep all follow-up appointments. Repeated evaluation of your symptoms is recommended for your recovery.  · Watch your symptoms and tell others to do the same. Complications sometimes occur after a concussion. Older adults with a brain injury may have a higher risk of serious complications, such as a blood clot on the brain.  · Tell your teachers, school nurse, school counselor, coach, athletic trainer, or work manager about your injury, symptoms, and restrictions. Tell them about what you can or cannot do. They should watch for:  ¨ Increased problems with attention or concentration.  ¨ Increased difficulty remembering or learning new information.  ¨ Increased time needed to complete tasks or assignments.  ¨ Increased irritability or decreased ability to cope with stress.  ¨ Increased symptoms.  · Rest. Rest helps the brain to heal. Make sure you:  ¨ Get plenty of sleep at night. Avoid staying up late at night.  ¨ Keep the same bedtime hours on weekends and weekdays.  ¨ Rest during the day. Take daytime naps or rest breaks when you feel tired.  · Limit activities that require a lot of thought or concentration. These include:  ¨ Doing homework or job-related  work.  ¨ Watching TV.  ¨ Working on the computer.  · Avoid any situation where there is potential for another head injury (football, hockey, soccer, basketball, martial arts, downhill snow sports and horseback riding). Your condition will get worse every time you experience a concussion. You should avoid these activities until you are evaluated by the appropriate follow-up health care providers.  Returning To Your Regular Activities  You will need to return to your normal activities slowly, not all at once. You must give your body and brain enough time for recovery.  · Do not return to sports or other athletic activities until your health care provider tells you it is safe to do so.  · Ask your health care provider when you can drive, ride a bicycle, or operate heavy machinery. Your ability to react may be slower after a   brain injury. Never do these activities if you are dizzy.  · Ask your health care provider about when you can return to work or school.  Preventing Another Concussion  It is very important to avoid another brain injury, especially before you have recovered. In rare cases, another injury can lead to permanent brain damage, brain swelling, or death. The risk of this is greatest during the first 7-10 days after a head injury. Avoid injuries by:  · Wearing a seat belt when riding in a car.  · Drinking alcohol only in moderation.  · Wearing a helmet when biking, skiing, skateboarding, skating, or doing similar activities.  · Avoiding activities that could lead to a second concussion, such as contact or recreational sports, until your health care provider says it is okay.  · Taking safety measures in your home.  ¨ Remove clutter and tripping hazards from floors and stairways.  ¨ Use grab bars in bathrooms and handrails by stairs.  ¨ Place non-slip mats on floors and in bathtubs.  ¨ Improve lighting in dim areas.  SEEK MEDICAL CARE IF:  · You have increased problems paying attention or  concentrating.  · You have increased difficulty remembering or learning new information.  · You need more time to complete tasks or assignments than before.  · You have increased irritability or decreased ability to cope with stress.  · You have more symptoms than before.  Seek medical care if you have any of the following symptoms for more than 2 weeks after your injury:  · Lasting (chronic) headaches.  · Dizziness or balance problems.  · Nausea.  · Vision problems.  · Increased sensitivity to noise or light.  · Depression or mood swings.  · Anxiety or irritability.  · Memory problems.  · Difficulty concentrating or paying attention.  · Sleep problems.  · Feeling tired all the time.  SEEK IMMEDIATE MEDICAL CARE IF:  · You have severe or worsening headaches. These may be a sign of a blood clot in the brain.  · You have weakness (even if only in one hand, leg, or part of the face).  · You have numbness.  · You have decreased coordination.  · You vomit repeatedly.  · You have increased sleepiness.  · One pupil is larger than the other.  · You have convulsions.  · You have slurred speech.  · You have increased confusion. This may be a sign of a blood clot in the brain.  · You have increased restlessness, agitation, or irritability.  · You are unable to recognize people or places.  · You have neck pain.  · It is difficult to wake you up.  · You have unusual behavior changes.  · You lose consciousness.  MAKE SURE YOU:  · Understand these instructions.  · Will watch your condition.  · Will get help right away if you are not doing well or get worse.  Document Released: 01/25/2004 Document Revised: 11/09/2013 Document Reviewed: 05/27/2013  [employer]® Patient Information ©2015 [employer]. This information is not intended to replace advice given to you by your health care provider. Make sure you discuss any questions you have with your health care provider.

## 2014-12-15 NOTE — Progress Notes (Signed)
MD notified patient has arrived on the unit.

## 2014-12-15 NOTE — H&P (Signed)
Reason for Consult:[facility] Referring Physician: EDP  MYRIAH BOGGUS is an 74 y.o. female.   HPI:  74 year old white female transferred from [facility] for further evaluation and management of a closed head injury suffered during a fall today. She went out to get the paper this morning and tripped over a rug coming back in her screened and porch and struck the left side of her face. No loss of consciousness. No nausea and vomiting. No double vision. No numbness tingling or weakness. She was evaluated at [facility] where she was found to have trace subdural blood along the right tentorium. I was able to evaluate her films and discuss her case with the emergency department physician. She was transferred for evaluation since the emergency department physician felt that the hospitalist there would not evaluate and potentially admit her for observation. She has some left frontal headache that appears to be mild aching and not progressive.   History reviewed. No pertinent past medical history.  History reviewed. No pertinent past surgical history.  Not on File  History  Substance Use Topics  . Smoking status: Not on file  . Smokeless tobacco: Not on file  . Alcohol Use: Not on file    History reviewed. No pertinent family history.   Review of Systems  Positive ROS: neg  All other systems have been reviewed and were otherwise negative with the exception of those mentioned in the HPI and as above.  Objective: Vital signs in last 24 hours: Temp:  [98.4 F (36.9 C)] 98.4 F (36.9 C) (01/28 1659) Pulse Rate:  [78] 78 (01/28 1659) Resp:  [18] 18 (01/28 1659) BP: (159)/(61) 159/61 mmHg (01/28 1659) SpO2:  [90 %] 90 % (01/28 1659)  General Appearance: Alert, cooperative, no distress, appears stated age Head: Normocephalic, left frontal cephalhematoma with ecchymosis around the left eye Eyes: PERRL, conjunctiva/corneas clear, EOM's intact     Neck: Supple,  symmetrical, trachea midline, nontender to palpation Lungs:  respirations unlabored Extremities: Extremities normal, atraumatic, no cyanosis or edema   NEUROLOGIC:   Mental status: A&O x4, no aphasia, good attention span, Memory and fund of knowledge are normal Motor Exam - grossly normal, normal tone and bulk Sensory Exam - grossly normal Reflexes: symmetric, no pathologic reflexes Coordination - grossly normal Gait - grossly normal Balance - not tested Cranial Nerves: I: smell Not tested  II: visual acuity  OS: na    OD: na  II: visual fields Full to confrontation  II: pupils Equal, round, reactive to light  III,VII: ptosis None  III,IV,VI: extraocular muscles  Full ROM  V: mastication Normal  V: facial light touch sensation  Normal  V,VII: corneal reflex  Present  VII: facial muscle function - upper  Normal  VII: facial muscle function - lower Normal  VIII: hearing Not tested  IX: soft palate elevation  Normal  IX,X: gag reflex Present  XI: trapezius strength  5/5  XI: sternocleidomastoid strength 5/5  XI: neck flexion strength  5/5  XII: tongue strength  Normal    Data Review No results found for: WBC, HGB, HCT, MCV, PLT No results found for: NA, K, CL, CO2, BUN, CREATININE, GLUCOSE No results found for: INR, PROTIME  Radiology: No results found. CT scan of the head shows trace hyperdensity along the edge of the tentorium on the right. Basal cisterns are open. No skull fracture. No axial fluid collection. No mass effect or shift or edema.  Assessment/Plan: Mild closed head injury  with trace blood along the right tentorium without mass effect and a normal neurologic exam. She has a son and a husband who is willing to watch over her 24 hours a day for the next 24-48 hours. I have discussed the findings on the films with them. I do not believe she needs admission to the hospital for observation. I think the chance of significant worsening or enlargement of this tentorial  subdural blood is extremely small. She potentially could develop contusions of the frontal or temporal lobe or a chronic subdural hematoma and I think every neurosurgeon has seen this in their career though we usually see this as a later sequelae of the injury when a patient returns for further workup... So I cannot promise her that she will not develop one of these lesions but I think the likelihood is small and the risk to her is quite small. I discussed concussion and the symptoms of postconcussive syndrome. She or her family is to call should she have worsening headache or nausea and vomiting or visual changes or change in mental status. I do not believe she needs repeat imaging unless she has some type of change.   Cadarius Nevares S 12/15/2014 5:37 PM

## 2014-12-15 NOTE — Discharge Summary (Signed)
Physician Discharge Summary  Patient ID: LASHEBA STEVENS MRN: [id number] DOB/AGE: September 09, 1941 74 y.o.  Admit date: 12/15/2014 Discharge date: 12/15/2014  Admission Diagnoses: Closed head injury    Discharge Diagnoses: Same   Discharged Condition: stable  Hospital Course: The patient was admitted on 12/15/2014 for evaluation of a closed head injury suffered during a non-syncopal fall in the morning. Her head CT showed minimal blood along the right tentorium without mass effect or shift. Her neurologic exam was normal. She had mild headache in the left frontal region. She had ecchymosis around the left eye. I discussed her situation and her films and reviewed the films with her for my partners and we all agreed that she did not necessarily need admission to the hospital for observation. We felt comfortable allowing her to go home with her family who agreed to watch over her. We felt like that the risk of change in the imaging findings was minimal and she was safe for discharge home. She felt very comfortable with this though we offered to let her stay the night since she was transferred here for observation. Shearer family are to call should she have any change in her condition or change in mental status. We discussed concussion, its symptoms and its management. She and her family are comfortable with discharge tonight from the hospital.  Consults: None  Significant Diagnostic Studies:  No results found for this or any previous visit.  No results found.  Antibiotics:  Anti-infectives    None      Discharge Exam: Blood pressure 159/61, pulse 78, temperature 98.4 F (36.9 C), temperature source Oral, resp. rate 18, SpO2 90 %. Neurologic: Grossly normal Ecchymosis around left eye  Discharge Medications:     Medication List    Notice    You have not been prescribed any medications.      Disposition: Home   Final Dx: Closed head injury      Discharge Instructions    Call MD  for:  difficulty breathing, headache or visual disturbances    Complete by:  As directed      Call MD for:  persistant dizziness or light-headedness    Complete by:  As directed      Call MD for:  persistant nausea and vomiting    Complete by:  As directed      Call MD for:  severe uncontrolled pain    Complete by:  As directed      Call MD for:  temperature >100.4    Complete by:  As directed      Diet - low sodium heart healthy    Complete by:  As directed      Discharge instructions    Complete by:  As directed   No strenuous activity until symptoms clear     Increase activity slowly    Complete by:  As directed            Follow-up Information    Follow up with Eustace Moore, MD.   Specialty:  Neurosurgery   Why:  As needed   Contact information:   [address] [place] [place] [address] [phone]        Signed: Eustace Moore 12/15/2014, 5:49 PM

## 2015-01-15 ENCOUNTER — Emergency Department: Payer: Self-pay | Admitting: Emergency Medicine

## 2015-01-17 ENCOUNTER — Ambulatory Visit: Payer: Self-pay | Admitting: Physical Medicine and Rehabilitation

## 2015-02-09 ENCOUNTER — Ambulatory Visit: Payer: Self-pay | Admitting: Internal Medicine

## 2015-02-18 ENCOUNTER — Emergency Department
Admit: 2015-02-18 | Disposition: A | Discharge status: Home / Self Care | Payer: Self-pay | Admitting: Emergency Medicine

## 2015-02-18 LAB — COMPREHENSIVE METABOLIC PANEL
ALBUMIN: 4.5 g/dL
ALK PHOS: 57 U/L
ANION GAP: 10 (ref 7–16)
BILIRUBIN TOTAL: 1 mg/dL
BUN: 12 mg/dL
CALCIUM: 9.5 mg/dL
CHLORIDE: 94 mmol/L — AB
CREATININE: 0.64 mg/dL
Co2: 27 mmol/L
EGFR (African American): >60
EGFR (Non-African Amer.): >60
Glucose: 122 mg/dL — ABNORMAL HIGH
Potassium: 3.6 mmol/L
SGOT(AST): 20 U/L
SGPT (ALT): 13 U/L — ABNORMAL LOW
Sodium: 131 mmol/L — ABNORMAL LOW
TOTAL PROTEIN: 7.3 g/dL

## 2015-02-18 LAB — URINALYSIS, COMPLETE
BACTERIA: NONE SEEN
Bilirubin,UR: NEGATIVE
Blood: NEGATIVE
Glucose,UR: NEGATIVE mg/dL (ref 0–75)
Leukocyte Esterase: NEGATIVE
Nitrite: NEGATIVE
PH: 8 (ref 4.5–8.0)
Protein: NEGATIVE
Specific Gravity: 1.008 (ref 1.003–1.030)

## 2015-02-18 LAB — CBC
HCT: 46.2 % (ref 35.0–47.0)
HGB: 15.5 g/dL (ref 12.0–16.0)
MCH: 27.8 pg (ref 26.0–34.0)
MCHC: 33.6 g/dL (ref 32.0–36.0)
MCV: 83 fL (ref 80–100)
PLATELETS: 314 10*3/uL (ref 150–440)
RBC: 5.58 10*6/uL — ABNORMAL HIGH (ref 3.80–5.20)
RDW: 13.8 % (ref 11.5–14.5)
WBC: 13.1 10*3/uL — ABNORMAL HIGH (ref 3.6–11.0)

## 2015-02-18 LAB — LIPASE, BLOOD: LIPASE: 46 U/L

## 2015-03-01 ENCOUNTER — Emergency Department
Admit: 2015-03-01 | Disposition: A | Discharge status: Home / Self Care | Payer: Self-pay | Admitting: Emergency Medicine

## 2015-03-01 LAB — BASIC METABOLIC PANEL
Anion Gap: 10 (ref 7–16)
BUN: 14 mg/dL
CHLORIDE: 93 mmol/L — AB
Calcium, Total: 9 mg/dL
Co2: 30 mmol/L
Creatinine: 0.77 mg/dL
EGFR (African American): >60
EGFR (Non-African Amer.): >60
Glucose: 116 mg/dL — ABNORMAL HIGH
POTASSIUM: 4.1 mmol/L
Sodium: 133 mmol/L — ABNORMAL LOW

## 2015-03-01 LAB — CBC
HCT: 41.1 % (ref 35.0–47.0)
HGB: 13.2 g/dL (ref 12.0–16.0)
MCH: 27 pg (ref 26.0–34.0)
MCHC: 32.1 g/dL (ref 32.0–36.0)
MCV: 84 fL (ref 80–100)
Platelet: 164 10*3/uL (ref 150–440)
RBC: 4.89 10*6/uL (ref 3.80–5.20)
RDW: 13.8 % (ref 11.5–14.5)
WBC: 14.9 10*3/uL — ABNORMAL HIGH (ref 3.6–11.0)

## 2015-03-01 LAB — TROPONIN I: Troponin-I: <0.03 ng/mL

## 2015-03-06 ENCOUNTER — Inpatient Hospital Stay
Admit: 2015-03-06 | Disposition: A | Discharge status: Home / Self Care | Payer: Self-pay | Attending: Internal Medicine | Admitting: Internal Medicine

## 2015-03-06 LAB — CBC
HCT: 36.4 % (ref 35.0–47.0)
HGB: 11.6 g/dL — AB (ref 12.0–16.0)
MCH: 27.1 pg (ref 26.0–34.0)
MCHC: 32 g/dL (ref 32.0–36.0)
MCV: 85 fL (ref 80–100)
PLATELETS: 307 10*3/uL (ref 150–440)
RBC: 4.3 10*6/uL (ref 3.80–5.20)
RDW: 14.2 % (ref 11.5–14.5)
WBC: 25.3 10*3/uL — AB (ref 3.6–11.0)

## 2015-03-06 LAB — COMPREHENSIVE METABOLIC PANEL
ALBUMIN: 2.2 g/dL — AB
ALK PHOS: 75 U/L
ANION GAP: 8 (ref 7–16)
BILIRUBIN TOTAL: 0.9 mg/dL
BUN: 25 mg/dL — ABNORMAL HIGH
CO2: 30 mmol/L
CREATININE: 0.75 mg/dL
Calcium, Total: 8.1 mg/dL — ABNORMAL LOW
Chloride: 96 mmol/L — ABNORMAL LOW
EGFR (Non-African Amer.): >60
GLUCOSE: 95 mg/dL
Potassium: 3.7 mmol/L
SGOT(AST): 15 U/L
SGPT (ALT): 11 U/L — ABNORMAL LOW
Sodium: 134 mmol/L — ABNORMAL LOW
TOTAL PROTEIN: 5.9 g/dL — AB

## 2015-03-06 LAB — TROPONIN I
Troponin-I: 0.1 ng/mL — ABNORMAL HIGH
Troponin-I: 0.08 ng/mL — ABNORMAL HIGH

## 2015-03-06 LAB — CK-MB
CK-MB: 5.1 ng/mL — ABNORMAL HIGH
CK-MB: 9.4 ng/mL — ABNORMAL HIGH
CK-MB: 7.4 ng/mL — ABNORMAL HIGH

## 2015-03-06 LAB — LACTIC ACID, PLASMA: Lactic Acid, Venous: 1.3 mmol/L

## 2015-03-07 LAB — CBC WITH DIFFERENTIAL/PLATELET
Basophil #: 0 10*3/uL (ref 0.0–0.1)
Basophil %: 0.1 %
EOS ABS: 0 10*3/uL (ref 0.0–0.7)
Eosinophil %: 0 %
HCT: 33.2 % — AB (ref 35.0–47.0)
HGB: 10.8 g/dL — AB (ref 12.0–16.0)
LYMPHS ABS: 0.3 10*3/uL — AB (ref 1.0–3.6)
Lymphocyte %: 1.5 %
MCH: 27.3 pg (ref 26.0–34.0)
MCHC: 32.6 g/dL (ref 32.0–36.0)
MCV: 84 fL (ref 80–100)
Monocyte #: 0.9 x10 3/mm (ref 0.2–0.9)
Monocyte %: 3.9 %
Neutrophil #: 20.8 10*3/uL — ABNORMAL HIGH (ref 1.4–6.5)
Neutrophil %: 94.5 %
Platelet: 318 10*3/uL (ref 150–440)
RBC: 3.97 10*6/uL (ref 3.80–5.20)
RDW: 13.9 % (ref 11.5–14.5)
WBC: 22 10*3/uL — ABNORMAL HIGH (ref 3.6–11.0)

## 2015-03-07 LAB — MAGNESIUM: MAGNESIUM: 1.9 mg/dL

## 2015-03-07 LAB — BASIC METABOLIC PANEL
Anion Gap: 3 — ABNORMAL LOW (ref 7–16)
BUN: 18 mg/dL
Calcium, Total: 8.1 mg/dL — ABNORMAL LOW
Chloride: 100 mmol/L — ABNORMAL LOW
Co2: 32 mmol/L
Creatinine: 0.57 mg/dL
EGFR (African American): >60
EGFR (Non-African Amer.): >60
GLUCOSE: 155 mg/dL — AB
Potassium: 3.3 mmol/L — ABNORMAL LOW
Sodium: 135 mmol/L

## 2015-03-08 LAB — CBC WITH DIFFERENTIAL/PLATELET
Basophil #: 0.1 10*3/uL (ref 0.0–0.1)
Basophil %: 0.4 %
Eosinophil #: 0 10*3/uL (ref 0.0–0.7)
Eosinophil %: 0 %
HCT: 32.5 % — AB (ref 35.0–47.0)
HGB: 10.5 g/dL — ABNORMAL LOW (ref 12.0–16.0)
Lymphocyte #: 0.5 10*3/uL — ABNORMAL LOW (ref 1.0–3.6)
Lymphocyte %: 2.2 %
MCH: 27.1 pg (ref 26.0–34.0)
MCHC: 32.3 g/dL (ref 32.0–36.0)
MCV: 84 fL (ref 80–100)
MONOS PCT: 3 %
Monocyte #: 0.8 x10 3/mm (ref 0.2–0.9)
NEUTROS ABS: 23.6 10*3/uL — AB (ref 1.4–6.5)
NEUTROS PCT: 94.4 %
Platelet: 374 10*3/uL (ref 150–440)
RBC: 3.87 10*6/uL (ref 3.80–5.20)
RDW: 14 % (ref 11.5–14.5)
WBC: 25 10*3/uL — AB (ref 3.6–11.0)

## 2015-03-08 LAB — BASIC METABOLIC PANEL
Anion Gap: 2 — ABNORMAL LOW (ref 7–16)
BUN: 18 mg/dL
CHLORIDE: 102 mmol/L
CREATININE: 0.6 mg/dL
Calcium, Total: 8.5 mg/dL — ABNORMAL LOW
Co2: 34 mmol/L — ABNORMAL HIGH
EGFR (African American): >60
Glucose: 152 mg/dL — ABNORMAL HIGH
POTASSIUM: 3.5 mmol/L
Sodium: 138 mmol/L

## 2015-03-09 LAB — CBC WITH DIFFERENTIAL/PLATELET
BASOS PCT: 0.2 %
Basophil #: 0 10*3/uL (ref 0.0–0.1)
EOS PCT: 0 %
Eosinophil #: 0 10*3/uL (ref 0.0–0.7)
HCT: 33.2 % — AB (ref 35.0–47.0)
HGB: 10.6 g/dL — ABNORMAL LOW (ref 12.0–16.0)
LYMPHS PCT: 5 %
Lymphocyte #: 0.9 10*3/uL — ABNORMAL LOW (ref 1.0–3.6)
MCH: 26.8 pg (ref 26.0–34.0)
MCHC: 32 g/dL (ref 32.0–36.0)
MCV: 84 fL (ref 80–100)
Monocyte #: 0.9 x10 3/mm (ref 0.2–0.9)
Monocyte %: 4.7 %
NEUTROS ABS: 17.1 10*3/uL — AB (ref 1.4–6.5)
Neutrophil %: 90.1 %
Platelet: 361 10*3/uL (ref 150–440)
RBC: 3.95 10*6/uL (ref 3.80–5.20)
RDW: 14.1 % (ref 11.5–14.5)
WBC: 19 10*3/uL — AB (ref 3.6–11.0)

## 2015-03-09 LAB — BASIC METABOLIC PANEL
Anion Gap: 6 — ABNORMAL LOW (ref 7–16)
BUN: 20 mg/dL
CO2: 35 mmol/L — AB
Calcium, Total: 8.6 mg/dL — ABNORMAL LOW
Chloride: 99 mmol/L — ABNORMAL LOW
Creatinine: 0.56 mg/dL
EGFR (Non-African Amer.): >60
GLUCOSE: 104 mg/dL — AB
Potassium: 4.2 mmol/L
Sodium: 140 mmol/L

## 2015-03-09 LAB — VANCOMYCIN, TROUGH: VANCOMYCIN, TROUGH: 13 ug/mL

## 2015-03-10 LAB — CBC WITH DIFFERENTIAL/PLATELET
Basophil #: 0 10*3/uL (ref 0.0–0.1)
Basophil %: 0.3 %
EOS PCT: 0.1 %
Eosinophil #: 0 10*3/uL (ref 0.0–0.7)
HCT: 35.1 % (ref 35.0–47.0)
HGB: 11.1 g/dL — ABNORMAL LOW (ref 12.0–16.0)
LYMPHS PCT: 8.3 %
Lymphocyte #: 1.5 10*3/uL (ref 1.0–3.6)
MCH: 26.6 pg (ref 26.0–34.0)
MCHC: 31.7 g/dL — ABNORMAL LOW (ref 32.0–36.0)
MCV: 84 fL (ref 80–100)
MONOS PCT: 4.6 %
Monocyte #: 0.8 x10 3/mm (ref 0.2–0.9)
NEUTROS ABS: 15.5 10*3/uL — AB (ref 1.4–6.5)
Neutrophil %: 87.7 %
Platelet: 397 10*3/uL (ref 150–440)
RBC: 4.18 10*6/uL (ref 3.80–5.20)
RDW: 14.3 % (ref 11.5–14.5)
WBC: 17.7 10*3/uL — AB (ref 3.6–11.0)

## 2015-03-10 NOTE — H&P (Signed)
PATIENT NAME:  Robin Hudson, Robin Hudson MR#:  [id number] DATE OF BIRTH:  1941-06-22  DATE OF ADMISSION:  01/28/2013  PRIMARY CARE PHYSICIAN: Apolonio Schneiders, MD  REFERRING PHYSICIAN: Lavonia Drafts, MD  CHIEF COMPLAINT: Increased shortness of breath.   HISTORY OF PRESENT ILLNESS: The patient is a 74 year old pleasant Caucasian female with history of chronic obstructive pulmonary disease. She was in her usual state of health until about a week ago. After taking a shower, she could not get her breath and felt progressively worse. The following day, on Wednesday, she went to the walk-in clinic and she was placed on Augmentin; however, symptoms progressed and did not feel better. Primarily she is short of breath, has wheezing, feels tight in her chest, sleepy and tired today. She reported low-grade fever earlier in the week. Denies any cough. No sputum production. No chest pain. The patient was evaluated at the Emergency Room received breathing treatment; however, she had only partial improvement. The patient was admitted with diagnosis of acute exacerbation of chronic obstructive pulmonary disease for further evaluation and treatment.   REVIEW OF SYSTEMS: CONSTITUTIONAL: Denies any fever, only earlier this week she had low-grade fever. No chills, but she has fatigue.  EYES: No blurring of vision. No double vision.  ENT: No hearing impairment. No sore throat. No dysphagia.  CARDIOVASCULAR: No chest pain, but she has shortness of breath and wheezing. No edema. No syncope.  RESPIRATORY: Reports shortness of breath and wheezing. No hemoptysis. No chest pain.  GASTROINTESTINAL: No abdominal pain, no vomiting, no diarrhea.  GENITOURINARY: No dysuria and no frequency of urination.  MUSCULOSKELETAL: No joint pain or swelling. No muscular pain or swelling.  INTEGUMENTARY: No skin rash. No ulcers.  NEUROLOGY: No focal weakness. No seizure activity. No headache.  PSYCHIATRY: No anxiety. No depression.  ENDOCRINE: No  polyuria or polydipsia. No heat or cold intolerance.  HEMATOLOGY: No easy bruisability. No lymph node enlargement.   PAST MEDICAL HISTORY:  Chronic obstructive pulmonary disease, systemic hypertension, hyperlipidemia, history of breast cancer status post chemotherapy and radiation, history of colon cancer status post resection in 2008, followed up by Dr. Oliva Bustard. Right adrenal mass. Osteoporosis.   PAST SURGICAL HISTORY: Colon cancer resection in 2008. Appendectomy, hysterectomy, cardiac catheterization by Dr. Ubaldo Glassing in April 2013 showing ejection fraction of 45%, but no significant coronary artery disease.   SOCIAL HABITS: Chronic smoker. She started smoking in her 65s and then she quit for a few years and then back smoking again, averages 1 pack a day. However lately, she cut down to maybe a few cigarettes a week. No history of alcohol or drug abuse.   SOCIAL HISTORY: She is married, living with her husband and she is married for the last 25 years.   FAMILY HISTORY: Her father died from complications of stroke. Mother died at the age of 64 after having a heart attack and she had a brother who died from stroke.   ADMISSION MEDICATIONS: Ventilation inhaler 2 puffs 4 times a day p.r.n., vitamin D3 1000 units once a day, simvastatin 20 mg this is taken 3 times a week. Losartan 50 mg once a day, DuoNeb p.r.n. Augmentin just started a few days ago 875 mg twice a day, atenolol 50 mg once a day, alprazolam 0.5 mg twice a day p.r.n., Advair Diskus 250, 1 puff twice a day, acetaminophen p.r.n.   ALLERGIES: ACE INHIBITOR, COREG, STATIN,    PHYSICAL EXAMINATION: VITAL SIGNS: Blood pressure 153/76, respiratory rate 22, pulse 100, temperature 97.7, oxygen saturation  was 95%. It is unclear if this was on oxygen or not.  GENERAL: This is an elderly female lying in bed in no acute distress.  HEAD AND NECK: No pallor. No icterus. No cyanosis.  ENT: Ear examination revealed normal hearing, no discharge, no lesions.  No ulcers. Examination of the nose showed no ulcers, no discharge, no bleeding. Oropharyngeal area showed no ulcers, no oral thrush. She is edentulous and has dentures. Eye examination revealed normal eyelids and conjunctivae. Pupils are round, equal, about 5 mm, reactive to light.   NECK: Supple. Trachea at midline. No thyromegaly. No cervical lymphadenopathy. No masses.  HEART: Normal S1, S2. No S3, S4. No murmur. No gallop. No carotid bruits.  LUNGS: Normal breathing pattern without use of accessory muscles. No rales, but she has a few scattered wheezing and prolonged expiratory phase.  ABDOMEN: Soft without tenderness. No hepatosplenomegaly. No masses. No hernias.  SKIN: No ulcers. No subcutaneous nodules.  MUSCULOSKELETAL: No joint swelling. No clubbing.  NEUROLOGIC: Cranial nerves II through XII are intact. No focal motor deficit.  PSYCHIATRIC: The patient is alert and oriented x 3. Mood and affect were normal.   LABORATORY FINDINGS: Chest x-ray showed no acute cardiopulmonary abnormalities, no consolidation. No effusion. There is evidence of hyperinflation.   Her blood work-up showed glucose of 102. B-type natriuretic peptide 190, BUN 9, creatinine 1.1, sodium 141, and potassium 3.7, calcium 8.8. Normal liver function tests and transaminases. CPK 61. Troponin 0.02. CBC showed white count of 9000, hemoglobin 14, hematocrit 44, platelet count 200.   ASSESSMENT: 1.  Acute exacerbation of chronic obstructive pulmonary disease.  2.  Systemic hypertension.  3.  Hyperlipidemia.  4.  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   OTHER MEDICAL PROBLEMS:  Includes history of breast cancer status post chemotherapy and radiation, history of colon cancer status post resection and osteoporosis.   PLAN: We will admit to the medical floor. Intensify treatment with bronchodilator therapy along with IV Solu-Medrol. I will continue the oral antibiotics using Augmentin.  Oxygen supplementation. Continue home medications as listed above. Deep vein thrombosis prophylaxis with heparin subcutaneously. I spoke with the patient about LIVING WILL  and issue of life support.   CODE STATUS:  She is full code at the time being.   TIME SPENT: Evaluating this patient and reviewing medical records took more 55 minutes.     ____________________________ Clovis Pu. Lenore Manner, MD amd:cc D: 01/28/2013 23:04:34 ET T: 01/28/2013 23:30:42 ET JOB#: [id number]  cc: Clovis Pu. Lenore Manner, MD, <Dictator> Ellin Saba MD ELECTRONICALLY SIGNED 01/29/2013 6:19

## 2015-03-10 NOTE — Op Note (Signed)
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   PROCEDURE:  1.  Left endoscopic sphenoid sinusotomy with removal of contents.  2.  ITSS.   SURGEON: Huey Romans, M.D.   ANESTHESIA: General.   COMPLICATIONS: None.  TOTAL ESTIMATED BLOOD LOSS: Minimal.   PROCEDURE: The patient was given general anesthesia by oral endotracheal intubation. Once she was asleep, the image guided system was brought in, the CT scan was downloaded from the disc, the face was washed with alcohol and the template was applied to the face. The template was then registered to the system with 0.5 mm of variance. The suction instruments were registered to the system as well and these showed perfect alignment with the CT scan. She was prepped and draped in a sterile fashion. Topical anesthesia was placed in the nose using cotton pledgets soaked in phenylephrine and Xylocaine as per the normal routine. A small amount of phenylephrine and Xylocaine were placed into the soft tissues at the anterior border of the uncinate process where it meets the anterior border of the middle turbinate. It was then injected at the posterior border of the middle turbinate where it attached to the lateral wall and some along the rostrum of the sphenoid. This was allowed to sit for 10 minutes for vasoconstriction. The rest of the instrumentation was readied at that time.   The cotton pledgets were removed and the 0 degree endoscope was used for visualizing the nasal anatomy. The middle turbinate was slightly outfractured posteriorly to provide better visualization of the area. You could see right down to the superior turbinate. A small through biting forceps was used to make an incision at the posterior 1/3 of the  superior turbinate. Some of this was trimmed and removed and this opened directly up into the face of the sphenoid sinus. You could see mucopurulent debris that was sitting inside the sinus. A Kerrison punch was used to widen the opening and then suctioned and ethmoid forceps were used to bring out big chunks of dried mucus. This appeared to be a fungus ball along with just pasty mucus that was around it. I flushed all this out and suctioned out to get it all totally clean. You could see very thickened mucous membranes of the sphenoid sinus.   The Diego microdebrider was used for freshening up the edges to make sure there were no pieces of the mucosa that could cause scar band. There was a good opening of the sphenoid sinus. The image guided system was used for visualizing this area and making sure that the depth of dissection was proper. It was completely cleaned out.   XeroGel was then trimmed and one piece placed right into the sphenoid sinus, another along its edge. The middle turbinate was then infractured again back to its normal position and a piece of XeroGel was placed in the middle meatus. The gel was then soaked with saline to wet it. This left the middle turbinate in its normal position.    The patient tolerated the procedure well. She was awakened and taken to the recovery room in satisfactory condition. There were no operative complications.   ____________________________ Huey Romans, MD phj:aw D: 03/08/2013 09:34:01 ET T: 03/08/2013 09:48:50 ET JOB#: [id number]  cc: Huey Romans, MD, <Dictator>  Huey Romans MD ELECTRONICALLY SIGNED 03/10/2013 7:43

## 2015-03-10 NOTE — Discharge Summary (Signed)
PATIENT NAME:  Robin Hudson, Robin Hudson MR#:  [id number] DATE OF BIRTH:  16-Sep-1941  DATE OF ADMISSION:  11/11/2013 DATE OF DISCHARGE:  11/13/2013  PRIMARY CARE PHYSICIAN:  Dr. Derinda Late.  FINAL DIAGNOSES: 1.  Systemic inflammatory response syndrome, pneumonia and leukocytosis.  2.  Mass in the either the lung versus anterior mediastinum.  3.  Chronic respiratory failure and chronic obstructive pulmonary disease.  4.  Anxiety.  5.  Hypertension.   MEDICATIONS ON DISCHARGE:  Include Advair Diskus 250/50 1 inhalation twice a day, vitamin 1000 international units daily, Ventolin HFA CFC 2 puffs 4 times a day as needed for shortness of breath, Singulair 10 mg 1 tablet daily in the evening, acetaminophen hydrocodone 300/5 1 tablet every six hours as needed for pain, Xanax 0.5 mg 3 times a day as needed for anxiety, atenolol 50 mg daily, DuoNeb nebulizer solution 3 mL every four hours as needed for shortness of breath, losartan 50 mg orally once a day, Levaquin 750 mg at bedtime every other day until completion, oxygen 2 liters nasal cannula.   DIET:  Low sodium diet, regular consistency.   ACTIVITY:  As tolerated.   FOLLOW-UP:  With Dr. Genevive Bi, cardiothoracic surgery as outpatient.   Laboratory data that is still pending at the time of discharge including a acetylcholine receptor antibody, alpha-fetoprotein and beta-hCG and TSH.   HOSPITAL COURSE:  The patient was admitted 11/11/2013 and discharged 11/13/2013.  The patient came in with generalized weakness and shortness of breath, was admitted with systemic inflammatory response syndrome, fever, leukocytosis, suspected pneumonia, was given IV Levaquin.  The patient also had acute respiratory failure with oxygen saturations in the mid-80s and an abnormal chest x-ray.  Laboratory and radiological data during the hospital course included chest x-ray that showed underlying emphysema with areas of scarring, new increased opacification anterior mediastinum only  seen on the lateral view.  Influenza A and B were negative.  White count 22,000, hemoglobin and hematocrit 13.7 and 41.1, platelet count of 218.  Blood cultures no growth.  BNP 607.  Glucose 119, BUN 13, creatinine 1.15, sodium 135, potassium 3.9, chloride 100, CO2 31, calcium 8.5.  Liver function tests normal range.  CT scan of the chest with contrast showed approximate 3.4 cm mass involving the anterior mediastinum to the left of midline at the level of the main pulmonary trunk, consolidation in the anterior medial left upper lobe immediately adjacent to the mass, severe COPD emphysema, biapical pleural parenchymal scarring, steatosis involving the right lobe of the visualized liver.  Urine culture grew out mixed organisms.  Troponin was negative.  Urinalysis, trace leukocyte esterase, creatinine upon discharge 0.81.  White count upon discharge 13,000.  TSH actually did come back at 1.41 and beta-hCG came back at 3 which is normal.   Between:    1.  Dr. Pat Patrick, surgery.   2.  Dr. Mortimer Fries, pulmonary.   HOSPITAL COURSE PER PROBLEM LIST:  1.  For the patient's systemic inflammatory response syndrome with leukocytosis and fever and pneumonia, the patient was given IV Levaquin.  The patient's vital signs on discharge improved with a temperature 98.2, blood pressure 104/60, pulse 84, respirations 18, O2 saturation 94% on 2 liters.  The patient was feeling better and wanted to go home.  I finished up a course of Levaquin 750 q. 48 hours.  This could be a postobstructive pneumonia.  2.  Masses in either the lung versus anterior mediastinum.  The patient was seen in  consultation by Dr. Pat Patrick who showed the films to Dr. Genevive Bi, cardiothoracic surgery.  The patient will need close clinical follow-up as outpatient for evaluation of this mass.  I did send off an acetylcholine receptor antibody just in case this is a thymus.  The alpha beta protein and beta-hCG just in case this is a teratoma.  TSH  was normal.  I doubt this is thyroid.  Could also be lymphoma.  Dr. Genevive Bi believes that this could be a primary lung mass.  What will happen, we will treat pneumonia and then repeat a CAT scan as outpatient through Dr. Genevive Bi and further management will be up to Dr. Genevive Bi as outpatient.  3.  Chronic respiratory failure and COPD.  The patient has oxygen at home.  She was told to stay on the 2 liters of oxygen at this time.  She will continue her Advair Diskus and Ventolin and Singulair as outpatient.  4.  Anxiety:  The patient takes as needed Xanax.  5.  Hypertension.  Blood pressure stable on losartan and atenolol.   Time spent on discharge 35 minutes.    ____________________________ Tana Conch. Leslye Peer, MD rjw:ea D: 11/13/2013 15:45:37 ET T: 12020/11/512 01:30:52 ET JOB#: [id number]  cc: Tana Conch. Leslye Peer, MD, <Dictator> Derinda Late, MD Lew Dawes. Genevive Bi, MD Rodena Goldmann III, MD   Marisue Brooklyn MD ELECTRONICALLY SIGNED 11/15/2013 15:41

## 2015-03-10 NOTE — Discharge Summary (Signed)
PATIENT NAME:  Robin Hudson, Robin Hudson MR#:  [id number] DATE OF BIRTH:  06/21/1941  DATE OF ADMISSION:  01/28/2013 DATE OF DISCHARGE:  01/31/2013  DISCHARGE DIAGNOSES: 1.  Chronic obstructive pulmonary disease exacerbation.  2.  Chronic systolic heart failure.  3.  Hypertension.  4.  Hyperlipidemia.  5.  History of breast cancer.  6.  HISTORY OF NAUSEA AND VOMITING WITH AUGMENTIN.   DISCHARGE MEDICATIONS: Atenolol 50 mg daily. simvastatin 20 mg p.o. t.i.d., Advair Diskus 250/50, 1 puff b.i.d., losartan 50 mg daily, acetaminophen with hydrocodone 500/5 every 4 hours as needed for pain, DuoNebs every 4 hours as needed for shortness of breath, Xanax 0.5 mg p.o. b.i.d. as needed, Ventolin as needed for trouble breathing.   The patient did not receive any antibiotics at discharge because she was already treated with  a Z-Pak as an outpatient, along with Levaquin for five days, and she has received Augmentin during the hospital stay, so at the time of discharge she was afebrile, lungs were clear. I advised her to follow up with Dr. Arline Asp in 1 to 2 weeks.   DIET: Low-sodium, low-fat diet.   CONSULTANTS: None.   PATIENT'S HOSPITAL COURSE: A 74 year old female patient who was treated as an outpatient with Zithromax and also Levaquin, followed by Augmentin. The patient felt short of breath despite all this treatment. Came to the Emergency Room. The patient did have some low-grade fever and wheezing.   Look at the history and physical for full details.   Admitted to Hospitalist Service for COPD evaluation. The patient has a history of tobacco abuse. She quit a few years ago. Started back on Augmentin here, and the patient's vitals on  admission: Temperature was 97.7, heart rate 100, sats 95%, and the patient's white count was 9000 on admission. She was admitted for COPD flare, started on IV Solu-Medrol along with Augmentin and continued home medication.   THE PATIENT ACTUALLY HAD NAUSEA AND VOMITING WITH  AUGMENTIN AFTER 2 DOSES, SO WE STOPPED THE AUGMENTIN FOR NAUSEA AND VOMITING.   She received Zofran and changed the diet to clear liquids, but after stopping the Augmentin the patient felt better and on the 15th she did not have any wheezing and she remained afebrile. Her cultures were negative. Patient's troponins have been negative. Chest x-ray also did not show any infiltrates, just showed chronic fibrotic changes. The patient advised to continue inhalers as needed.   Time spent on discharge evaluation: More than 30 minutes.     ____________________________ Epifanio Lesches, MD sk:dm D: 02/03/2013 11:37:00 ET T: 02/03/2013 11:52:31 ET JOB#: [id number]  cc: Epifanio Lesches, MD, <Dictator> Epifanio Lesches MD ELECTRONICALLY SIGNED 02/16/2013 21:30

## 2015-03-10 NOTE — Consult Note (Signed)
PATIENT NAME:  Robin Hudson, Robin Hudson MR#:  [id number] DATE OF BIRTH:  [birth year]  DATE OF CONSULTATION:  11/12/2013  REFERRING PHYSICIAN:   CONSULTING PHYSICIAN:  Rodena Goldmann III, MD  PRIMARY CARE PHYSICIAN: Dr. Baldemar Lenis.   ADMITTING PHYSICIAN:  Prime doctor.  CHIEF COMPLAINT:  Shortness of breath.   BRIEF HISTORY:  Nitara Szczerba is a 74 year old woman with severe chronic obstructive lung disease admitted through the hospital Emergency Room with complaints of generalized weakness and malaise over the last several days. She had increasing fatigue, shortness of breath and did not feel that she was improving. Her O2 sats were in the mid-80's on room air and she had a low-grade temperature of 101. She had been exposed to several people who had been sick with upper respiratory infections so she was admitted with possible upper respiratory infection, pneumonia. A CT scan was performed, which demonstrated a vague, indistinct anterior mediastinal mass, for which the surgical service was consulted.   In discussing this problem with the patient, she relates that this mass was noted many years ago in [facility] and felt to be scarring from her previous upper respiratory infections. She has had multiple episodes of pneumonia. She relates that she has had previous scan workup of this area and it has remained stable. She denies any other significant pulmonary symptoms other than her long-standing shortness of breath.   She does have a history of colon cancer and breast cancer, both treated with surgery. She has had no recurrence to date. She continues to smoke. She has a history of hypertension.   MEDICATIONS:  Outlined in her admission note.   PHYSICAL EXAMINATION:  GENERAL:  She is comfortable, lying in bed and afebrile despite the fact that she was febrile at the time of admission. Heart rate is 90 and regular.  HEENT:  No scleral icterus. No pupillary abnormalities. No facial deformities.  NECK:  Supple,  nontender with a midline trachea.  LUNG EXAM:  Revealed significant wheezing, very distant breath sounds, delayed expiratory phase.  CARDIAC:  Reveals no murmurs, rubs, gallops to my ear. Seems to be in normal sinus rhythm.  ABDOMEN:  Soft, nontender with no organomegaly and is benign, otherwise.  EXTREMITIES:  Reveals some mild lower extremity edema, but she has good distal pulses. Full range of motion.  PSYCHIATRIC:  Normal orientation, normal affect.   ASSESSMENT AND PLAN:  I have independently reviewed her CT scan. There is a vague and distinct mass in the anterior mediastinum just off the left lung near the apex, which is difficult to distinguish from the lung tissue. I can see the abnormality on her chest x-ray. I reviewed her previous CT scan from 2011 and it is not apparent to me that lesion is still visible. The diagnostic possibilities would include previous scarring with surrounding inflammatory change in the mediastinum, thymoma, metastatic disease and then of course lymphoma. She has had previous thyroid surgery and this does not appear to be related to that problem.   At the present time, I do not see any indication for a further diagnostic workup. She is a very poor surgical candidate and would not be a candidate for a median sternotomy. This diagnostic process could be accomplished with a Chamberlain procedure, I believe, but I would need to discuss that option with our thoracic surgeon. I will arrange for him to review the films on Monday. At the present time, I do not see indication to prolong her hospitalization for workup of this  lesion. We will continue to follow her and arrange for Dr. Genevive Bi to review her films.    ____________________________ Rodena Goldmann III, MD rle:jm D: 11/12/2013 10:27:43 ET T: 11/12/2013 11:13:14 ET JOB#: [id number]  cc: Micheline Maze, MD, <Dictator> Rodena Goldmann MD ELECTRONICALLY SIGNED 11/12/2013 18:23

## 2015-03-11 LAB — BASIC METABOLIC PANEL
ANION GAP: 8 (ref 7–16)
BUN: 19 mg/dL
CALCIUM: 8.5 mg/dL — AB
CHLORIDE: 94 mmol/L — AB
CO2: 33 mmol/L — AB
Creatinine: 0.64 mg/dL
EGFR (Non-African Amer.): >60
Glucose: 81 mg/dL
Potassium: 3.6 mmol/L
Sodium: 135 mmol/L

## 2015-03-11 LAB — WBC: WBC: 18.2 x10 3/mm 3 — ABNORMAL HIGH

## 2015-03-11 LAB — VANCOMYCIN, TROUGH: Vancomycin, Trough: 16 ug/mL

## 2015-03-11 LAB — CULTURE, BLOOD (SINGLE)

## 2015-03-11 NOTE — H&P (Signed)
PATIENT NAME:  Robin Hudson, Robin Hudson MR#:  [id number] DATE OF BIRTH:  July 05, 1941  DATE OF ADMISSION:  11/11/2013  PRIMARY CARE PHYSICIAN:  Dr. Baldemar Lenis.   CHIEF COMPLAINT:  Generalized weakness and shortness of breath.   HISTORY OF PRESENT ILLNESS:  This is a 74 year old female who presents to the Emergency Room due to generalized weakness and just not feeling well over the past few days. The patient said that she was in usual state of health, but for the past few days, has been having more were shortness of breath, feeling fatigued, and having no energy. The patient said that she was not getting any better and therefore came to the ER for further evaluation. The patient was noted to be hypoxic with O2 sats in the mid 80s and noted to have a fever of 101 at triage. She was suspected to have a possible underlying pneumonia. Hospitalist services were contacted for further treatment and evaluation. The patient denies any chest pain, any nausea, vomiting, abdominal pain, does admit to chills. No weight gain or weight loss. No other associated symptoms presently. She does admit to sick contacts.   REVIEW OF SYSTEMS:  CONSTITUTIONAL:  Positive documented fever of 101. No weight gain or weight loss. Positive generalized weakness.  EYES:  No blurred or double vision.  ENT:  No tinnitus. No postnasal drip. No redness of the oropharynx.  RESPIRATORY:  Positive cough. No wheeze. No hemoptysis. Positive dyspnea.  CARDIOVASCULAR:  No chest pain. No orthopnea, no palpitations or syncope.  GASTROINTESTINAL:  No nausea, no vomiting, no diarrhea. No abdominal pain. No melena or hematochezia.  GENITOURINARY:  No dysuria or hematuria.  ENDOCRINE:  No polyuria or nocturia. No heat or cold intolerance.  HEMATOLOGIC:  No anemia. No bruising. No bleeding.  INTEGUMENTARY:  No rashes. No lesions.  MUSCULOSKELETAL:  No arthritis, no swelling, no gout.  NEUROLOGIC:  No numbness or tingling. No ataxia. No seizure-type activity.   PSYCHIATRIC:  Positive anxiety. No insomnia. No ADD.   PAST MEDICAL HISTORY:  Consistent with a history of colon cancer, breast cancer, a history of COPD with ongoing tobacco abuse, hypertension, anxiety.   ALLERGIES:  ACE INHIBITORS, AUGMENTIN, COREG AND STATIN.   FAMILY HISTORY:  Both mother and father are deceased. The patient's mother died from complications of an MI. Father died from a stroke.   CURRENT MEDICATIONS: As follows:  1.  Tylenol with hydrocodone 1 tab every 6 hours as needed.  2.  Advair 250/50 1 puff b.i.d. 3.  Xanax 0.5 mg t.i.d. as needed.  4.  Atenolol 50 mg daily.  5.  DuoNebs 4 times daily as needed.  6.  Losartan 50 mg daily.  7.  Singular 10 mg daily.  8.  Albuterol inhaler 2 puffs 4 times daily as needed.  9.  Vitamin D3 1000 international units daily.   PHYSICAL EXAMINATION:  VITAL SIGNS:   Temperature is 101, pulse 97, respirations 20, blood pressure 101/77, sats 85% on room air.  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  HEAD, EYES, EARS, NOSE, THROAT:  Atraumatic, normocephalic. Extraocular muscles are intact. Pupils are equal and reactive to light. Sclerae anicteric. No conjunctival injection. No pharyngeal erythema.  NECK:  Supple. There is no jugular venous distention. No bruits. No lymphadenopathy, no thyromegaly.  HEART:  Regular rate and rhythm. No murmurs. No rubs or clicks.  LUNGS:  Prolonged inspiratory and expiratory phase, positive expiratory wheezing, negative use of accessory muscles. No dullness  to percussion.  ABDOMEN:  Soft, flat, nontender, nondistended. Has good bowel sounds. No hepatosplenomegaly appreciated.  EXTREMITIES:  No evidence of any cyanosis, clubbing, or peripheral edema. Has +2 pedal and radial pulses bilaterally.  NEUROLOGICAL:  Alert, awake and oriented x 3 with no focal motor or sensory deficits appreciated bilaterally.  SKIN:  Moist and warm with no rashes appreciated.  LYMPHATIC:  No  cervical or axillary lymphadenopathy.  LABORATORY, DIAGNOSTIC, AND RADIOLOGICAL DATA:  Shows serum glucose of 119, BUN 13, creatinine 1.1, sodium 135, potassium 3.9, chloride 100, bicarb 31. LFTs are within normal limits. Troponin less than 0.02. White cell count 22, hemoglobin 13.7, hematocrit 41.1, platelet count 214.   The patient did have a chest x-ray done, which showed underlying emphysema with areas of scarring, a new increased opacification in the anterior mediastinum seen only in the lateral view.   ASSESSMENT AND PLAN:  This is a 74 year old female with a history of colon cancer, breast cancer, a history of chronic obstructive pulmonary disease with ongoing tobacco abuse, hypertension, anxiety, who presents to the hospital due to generalized weakness and not feeling well and noted to have a fever of 101 and hypoxic with O2 sats in the mid-80's.  1.  Systemic inflammatory response syndrome. This is likely diagnosis given the fact the patient presented with a fever and leukocytosis. I suspect this is secondary to underlying pneumonia. I will treat her with IV Levaquin, follow blood and sputum cultures and follow hemodynamics.  2.  Pneumonia, likely community-acquired pneumonia. I will treat her with IV Levaquin, follow sputum and blood cultures and follow her clinically.  3.  Leukocytosis, likely secondary to the underlying pneumonia. We will follow white cell count after IV antibiotic therapy.   4.  Abnormal chest x-ray. The patient had an increased opacification in the anterior mediastinum, which is new compared to her previous film. She is a high-risk patient given the history of breast and colon cancer and ongoing tobacco abuse. I will get a CT of her chest with contrast.  5.  Anxiety. Continue p.r.n. Xanax.  6.  Hypertension. Continue losartan and atenolol.  7.  Chronic obstructive pulmonary disease. No evidence of acute chronic obstructive pulmonary disease exacerbation. I will continue  her Advair and p.r.n. DuoNebs and continue her Singulair.   CODE STATUS:  The patient is a FULL CODE.   TIME SPENT ON ADMISSION: 50 minutes  ____________________________ Belia Heman. Verdell Carmine, MD vjs:jm D: 11/11/2013 15:39:48 ET T: 11/11/2013 16:17:42 ET JOB#: [id number]  cc: Belia Heman. Verdell Carmine, MD, <Dictator> Henreitta Leber MD ELECTRONICALLY SIGNED 12/15/2013 11:14

## 2015-03-12 LAB — EXPECTORATED SPUTUM ASSESSMENT W REFEX TO RESP CULTURE

## 2015-03-12 NOTE — H&P (Signed)
PATIENT NAME:  Robin Hudson, Robin Hudson MR#:  [id number] DATE OF BIRTH:  [birth year]  DATE OF ADMISSION:  04/03/2012  PRIMARY CARE PHYSICIAN: Apolonio Schneiders, MD.   CHIEF COMPLAINT: Chest congestion, shortness of breath.   HISTORY OF PRESENT ILLNESS: A 74 year old female who has history of chronic obstructive pulmonary disease. She is still smoking about less than pack per day, also history of hypertension, hyperlipidemia, history of breast cancer, colon cancer, right adrenal mass; and she also has mild cardiomyopathy with ejection fraction of around 45%. Today she comes in because of increasing chest congestion going on for about 4 to 5 days. She says that she started feeling sick this Monday. She is having cough with some milky white sputum. She is feeling wheezing and chest tightness. She called Dr. Arline Asp, and she was started on Z-Pak but she did not find much improvement with the back. She was taking her Combivent and nebulizer at home but she continued to be short of breath so she came here. She denies any chest pain per se. She was also complaining of some body ache all around, fever at home. She was running a temperature of 100.1 at home. When she came to the emergency room, she was saturating like 89% on room air. Her chest x-ray shows some atypical infection, confluent opacities in the lateral films.  Her white count was found to be elevated in the range of 15.1 here so she is being admitted for chronic obstructive pulmonary disease exacerbation with pneumonia. Blood cultures have been sent from the emergency room and she received and 750 mg of IV Levaquin along with 125 mg of IV Solu-Medrol in the emergency room. She also got DuoNeb in the emergency room. She denies any nausea, vomiting, or any abdominal pain.   REVIEW OF SYSTEMS: Positive for fever, weakness, body aches, no acute change in vision. No headache. No dizziness. She is complaining of cough, wheeze, dyspnea. No painful respiration. She has chronic  obstructive pulmonary disease. She is complaining of dyspnea on exertion. She has some baseline dyspnea on exertion. No chest pain. No palpitations. No nausea, vomiting, diarrhea, abdominal pain, GI bleed. No dysuria. No frequency. No thyroid problems.  No anemia. No rash. No joint pains. She says she has some lower back pain because of arthritis.  No focal numbness or weakness or anxiety.   PAST MEDICAL HISTORY:  1. Hypertension.  2. Hyperlipidemia.  3. History of breast cancer, status post chemotherapy and radiation therapy.   4. History of colon cancer, status post resection in 2008 followed by Dr. Oliva Bustard.   5. Right adrenal mass.   6. Chronic obstructive pulmonary disease.   7. Osteoporosis.  8. She had a recent cardiac catheterization done by Dr. Ubaldo Glassing in April of 2013, which showed that the patient had ejection fraction of 45%, but not very significant coronary artery disease. She had insignificant disease.   PAST SURGICAL HISTORY:  1. She had resection of the colon cancer.  2. Hysterectomy.  3. Appendectomy.   ALLERGIES TO MEDICATIONS: ACE inhibitor, Coreg, statins and verapamil causes constipation.    HOME MEDICATIONS: Her home medications which I reviewed with her include:   1. Acetaminophen/hydrocodone 5/500 q.4 hours p.r.n.  2. Advair Diskus 250/50 b.i.d.  3. Alprazolam 0.25 mg q.8h. p.r.n. .  5. Combivent MDI 2 to 4 puffs q.6 hours p.r.n.  6. Losartan 50 mg daily. Simvastatin 20 mg at bedtime.   7. Vitamin D 2000 units daily.  8. Ecotrin 81 mg daily.  SOCIAL HISTORY: She smokes less than a pack per day.  She has been smoking for a long time. No alcohol use.   FAMILY HISTORY: She says her mother had MI at age of 75. No colon or breast cancer, and a brother died of a stroke.   PHYSICAL EXAMINATION:  VITAL SIGNS: Her vitals when she presented to the emergency room include temperature of 97, heart rate of 103, respiratory rate 24, blood pressure 110/51, saturating 89% on  room air. Currently heart rate is 95, blood pressure of 100/51, saturating 92% on 2 liters nasal cannula.   GENERAL: This is an elderly Caucasian female, thin built. She is comfortably lying in bed, no acute distress.   HEENT: Bilateral pupils are equal. Extraocular muscles intact. No scleral icterus. No conjunctivitis. Oral mucosa is moist. No pallor.    NECK: No thyroid tenderness, enlargement or nodule. Neck is supple. No masses, nontender. No adenopathy. No JVD. No carotid bruit.   CHEST: She has diminished breath sounds on the right side. She has wheezing on the left side.  She has some crackles also, more on the left side. Normal respiratory effort. Not using accessory muscles of respiration.   HEART: Heart sounds are regular. No murmur. Good peripheral pulses. No lower extremity edema.   ABDOMEN: Soft, nontender. Normal bowel sounds. No hepatomegaly. No bruit. No masses.   RECTAL: Deferred.   NEUROLOGIC: She is awake, alert, oriented to time, place, and person. Cranial nerves are intact. Moving all extremities against gravity.   EXTREMITIES: No cyanosis. No clubbing.   SKIN: No rash. No lesions.   LABORATORY WORK: White count of 15.1, hemoglobin of 11, platelet count of 218,000.  BMP: Sodium of 135, potassium of 4, BUN of 13, creatinine 0.8. Troponin is negative. Her chest x-ray: She has some confluent opacities in the lateral films in the anterior lungs, and also there is  interstitial edema versus atypical infection superimposed on a background of chronic obstructive pulmonary disease with some biapical pleural parenchymal thickening.   IMPRESSION:  1. Chronic obstructive pulmonary disease exacerbation.   2. Systemic inflammatory response syndrome with leukocytosis, hypoxia, tachycardia, secondary to pneumonia.   3. Hypertension.   4. Hyperlipidemia.   5. History of colon and breast cancer.  6. Mild cardiomyopathy with ejection fraction of 45%.   7. Chronic systolic failure  compensated at this time.  8. Tobacco abuse.    PLAN: A 74 year old female with history of chronic obstructive pulmonary disease, still smoking less than a pack per day, history of hypertension, hyperlipidemia, presents with chest congestion, fever and wheezing, chest tightness of a week found to have a white count of around 15.9. She is tachycardic, hypoxic. Chest x-ray shows opacities, atypical infection. Blood cultures have been sent. I am going to continue Levaquin on her, continue Solu-Medrol, continue Advair, DuoNeb. I advised her smoking cessation considering her chronic obstructive pulmonary disease. She had a recent cardiac catheterization which showed insignificant coronary artery disease, ejection fraction of 45%. She does not appear to be in fluid overload at this time. We will continue losartan and atenolol for hypertension, simvastatin for hyperlipidemia. We will also send a sputum culture. She was taking Z-Pak home. She did not find any improvement with Z-Pak at home.    TIME SPENT WITH ADMISSION/COORDINATION:  45 minutes.     ____________________________ Mena Pauls, MD ag:vtd D: 04/03/2012 19:35:23 ET T: 04/04/2012 06:09:36 ET JOB#: [id number]  cc: Mena Pauls, MD, <Dictator> Vianne Bulls. Arline Asp, MD Mena Pauls MD ELECTRONICALLY  SIGNED 05/02/2012 17:40

## 2015-03-12 NOTE — Discharge Summary (Signed)
PATIENT NAME:  Robin Hudson, Robin Hudson MR#:  [id number] DATE OF BIRTH:  Sep 06, 1941  DATE OF ADMISSION:  04/03/2012 DATE OF DISCHARGE:  04/05/2012  DISCHARGE DIAGNOSES:  1. Acute chronic obstructive pulmonary disease exacerbation. 2. Systemic antiinflammatory response syndrome with leukocytosis, hypoxia, and tachycardia secondary to pneumonia. 3. Pneumonia. 4. Hypertension.  5. Hyperlipidemia.  6. Hyperglycemia due to steroids. 7. Normocytic anemia of chronic disease.  8. Chronic systolic congestive heart failure with ejection fraction of 45%, compensated.  9. Smoking.  DISPOSITION: The patient is being discharged home.   DIET: Low sodium.   ACTIVITY: As tolerated.   DISCHARGE FOLLOWUP: Follow-up with Dr. Apolonio Schneiders in 1 to 2 weeks after discharge.   DISCHARGE MEDICATIONS:  1. DuoNebs every four hours p.r.n. shortness of breath.  2. Levaquin 750 mg daily for six days. 3. Prednisone taper as prescribed. 4. Atenolol 50 mg daily.  5. Simvastatin 20 mg daily.  6. Advair 250/50 one puff twice a day. 7. Xanax 0.5 mg three times daily as needed. 8. Combivent 2 to 4 puffs every 6 hours p.r.n. 9. Losartan 50 mg daily.  10. Vicodin 5/500 mg one tablet every 4 hours p.r.n.  11. Vitamin D3 1000 international units once a day.   RESULTS: Blood cultures no growth so far.   Urinalysis showed no evidence of infection.   Chest x-ray, PA and lateral: Interstitial edema or atypical infection superimposed on background of chronic obstructive pulmonary disease. Prominent apical pleural/parenchymal thickening as similar to prior. More confluent opacity in the anterior lungs on the lateral view may be related to edema or infection.   White count 15.9 on admission and 11.7 by the time discharge. Hemoglobin 11 to 10.5. Normal platelet count. Glucose 104 to 162 and sodium 134. The rest of CMP and BMP normal. Cardiac enzymes negative.   HOSPITAL COURSE: The patient is a 74 year old female with past medical  history of ongoing smoking chronic, obstructive pulmonary disease, hypertension, breast cancer status post chemoradiation and colon cancer status post resection, right adrenal mass and significant coronary artery disease who presented with shortness of breath. The patient was found to have chronic obstructive pulmonary disease exacerbation and started on nebulizer treatments and Advair. She was placed on respiratory treatments. Initially she was on IV steroids but that has been transitioned to oral steroids by the time of discharge. She also had systemic inflammatory response syndrome with leukocytosis, hypoxia, and tachycardia due to pneumonia. She was treated with IV antibiotics during the hospitalization and has been switched to oral antibiotics. Her hypoxia, leukocytosis, tachycardia, and white count have all improved. Her blood cultures have been negative. She was counseled about smoking cessation extensively. She had some hyperglycemia which was felt to be due to use of steroids. She has history of chronic systolic congestive heart failure with ejection fraction of 45 to 50%, which was well compensated. Her hypertension remained well controlled during the hospitalization. The patient is being discharged home in a stable condition.   TIME SPENT: 45 minutes.  ____________________________ Cherre Huger, MD sp:slb D: 04/05/2012 12:36:33 ET     T: 04/05/2012 15:03:30 ET       JOB#: [id number] cc: Vianne Bulls. Arline Asp, MD Cherre Huger MD ELECTRONICALLY SIGNED 04/06/2012 7:11

## 2015-03-19 NOTE — Discharge Summary (Addendum)
PATIENT NAME:  Robin Hudson, Robin Hudson MR#:  [id number] DATE OF BIRTH:  Jun 26, 1941  DATE OF ADMISSION:  03/06/2015 DATE OF DISCHARGE:  03/12/2015  PRIMARY CARE PHYSICIAN:  Ramonita Lab, MD.   CONSULTING PHYSICIAN:  Dr. Nehemiah Massed in cardiology.   DISCHARGE DIAGNOSES:  1. Pneumonia, methicillin-resistant Staphylococcus aureus.    2. Chronic obstructive pulmonary disease exacerbation.  3. Hypertension.   HISTORY OF PRESENT ILLNESS:  Please see initial history and physical for details.  Briefly, this is a 74 year old female with a history of COPD as well as CHF and hypertension and AFib. She was admitted April 18 with increasing shortness of breath and cough for 10 days.  She had been started on amoxicillin as an outpatient but she worsened.  In the ED, she was found to have hypoxia at 84% and a white count of 25,000.  Chest x-ray revealed extensive pneumonia.   HOSPITAL COURSE BY ISSUE:   1. Pneumonia.  The patient was treated with broad-spectrum antibiotics vancomycin and Zosyn and levofloxacin.  Cultures were obtained and grew MRSA.  Antibiotics were tailored to just vancomycin.  She clinically improved.  White count improved but had remained somewhat elevated, likely due to steroids.  At discharge, white count was 18,000; down from 25,000 on admission.  Blood cultures were negative.  2. COPD exacerbation.  Treated with nebulizers and prednisone taper.  She was tapered off prednisone prior to discharge.  Clinically she was much improved; however, she remained on 3 liters of O2.  She normally uses O2 only at night at home.  She will be discharged on 3 liters.  3. Hypertension.  Blood pressure medications were adjusted.  Hydralazine was added and improved blood pressure control.  She will be discharged on that.  4. Debility.  The patient was seen by physical therapy who recommended home health.  This was arranged.   DISCHARGE MEDICATIONS:  Please see [facility] physician discharge summary.  New medications include  hydralazine as well as Bactrim 1 double strength twice a day for an 8-day course.   DISCHARGE INSTRUCTIONS:  The patient is to call to follow up within 1-2 weeks with Dr. Caryl Comes.  If she worsens, she will call to be seen sooner.   DISCHARGE DIET:  Low sodium, regular consistency.   DISCHARGE CODE STATUS:  Full code.   TIME SPENT:  This discharge took 35 minutes.    ____________________________ Cheral Marker. Ola Spurr, MD dpf:kc D: 03/12/2015 13:52:33 ET T: 03/12/2015 15:30:34 ET JOB#: [id number]  cc: Cheral Marker. Ola Spurr, MD, <Dictator> Damien Batty Ola Spurr MD ELECTRONICALLY SIGNED 03/21/2015 10:21

## 2015-03-19 NOTE — H&P (Signed)
PATIENT NAME:  Robin Hudson, Robin Hudson MR#:  [id number] DATE OF BIRTH:  10-31-1941  DATE OF ADMISSION:  03/06/2015  PRIMARY CARE PHYSICIAN:  Dr. Ramonita Lab.    REFERRING EMERGENCY DEPARTMENT PHYSICIAN:  Dr. Joni Fears.    CHIEF COMPLAINT: Shortness of breath.   HISTORY OF PRESENT ILLNESS: The patient is a 74 year old female who came into the ED with a chief complaint of worsening of shortness of breath. The patient is reporting that she has been short of breath and coughing for more than 10 days. She was evaluated by her primary care physician and treated with amoxicillin for pneumonia. For the past 3 days her shortness of breath is getting worse, still coughing a lot. Needs more oxygen. The patient came into the ED, her pulse oximetry is 84% on room air. Usually she uses oxygen at bedtime, but for the past few days she has been using oxygen during daytime as well. The ED white count is elevated at 25.3. Chest x-ray portable has revealed extensive airspace disease in the right upper and right mid lobe of the lungs. Blood cultures were obtained. The patient is started on broad-spectrum antibiotics with Zosyn, vancomycin, and levofloxacin. Hospitalist team is called to admit the patient. During my examination the patient is resting comfortably, but reports that she is getting short of breath with minimal exertion. No chest pain.   PAST MEDICAL HISTORY:  Has a history of CHF, COPD, chronic atrial fibrillation, history of colon cancer, breast cancer, ongoing tobacco abuse, hypertension, anxiety   PAST SURGICAL HISTORY: Denies any.   ALLERGIES:  ACE INHIBITORS, AUGMENTIN, COREG, AND STATIN.   PSYCHOSOCIAL HISTORY: Lives at home. Smokes 1 cigarette per day. Denies alcohol or illicit drug usage. Lives with husband.   FAMILY HISTORY: Both mother and father are deceased, mother deceased from complications of MI, father from stroke.    REVIEW OF SYSTEMS:  CONSTITUTIONAL: Complaining of fatigue and weakness.  EYES:  Denies blurry vision, double vision.  EARS, NOSE, AND THROAT: Denies epistaxis or discharge.  RESPIRATORY: Complaining of cough. Has chronic history of COPD.   CARDIOVASCULAR: No chest pain, palpitations.  Has chronic history of congestive heart failure and atrial fibrillation.  GASTROINTESTINAL: Denies nausea, vomiting, diarrhea, abdominal pain.  GENITOURINARY: No dysuria, hematuria.  GYNECOLOGIC AND BREASTS:  Denies vaginal discharge had a history of breast cancer.   ENDOCRINE: Denies polyuria, nocturia, thyroid problems.  HEMATOLOGIC AND LYMPHATIC: No anemia, easy bruising. Has history of colon cancer.  MUSCULOSKELETAL: No joint pain in the neck and back. Denies any gout.  NEUROLOGIC: Denies vertigo, ataxia.  PSYCHIATRIC:  No ADD, OCD.    PHYSICAL EXAMINATION:   VITAL SIGNS:  Temperature 97.4, pulse 81, respirations 18, blood pressure 109/67, pulse oximetry 91% on 3 liters.  GENERAL APPEARANCE: Not in acute distress. Moderately built and nourished. HEENT:  Normocephalic, atraumatic. Pupils are equally reacting to light and accommodation. No scleral icterus. No conjunctival injection. No sinus tenderness. No postnasal drip. Moist mucous membranes.  NECK: Supple. No JVD. No thyromegaly. Range of motion is intact.  LUNGS: Positive crackles more on the right side than on the left and decreased breath sounds on the right upper and middle lobes. No accessory muscle usage and no anterior chest wall tenderness on palpation.  CARDIAC: Irregularly irregular. No bruits.  GASTROINTESTINAL: Soft. Bowel sounds are positive in all 4 quadrants. Nontender, nondistended. No masses felt.  NEUROLOGIC: Awake, alert, oriented x 3. Cranial nerves II through XII are grossly intact. Motor and sensory are intact. Reflexes  are 2 +.  EXTREMITIES: No cyanosis. No clubbing.  SKIN: Warm to touch.  Normal turgor. No rashes.  No lesions.  MUSCULOSKELETAL: No joint effusion, tenderness, erythema.   PSYCHIATRIC: Normal  mood and affect.   LABORATORY AND IMAGING STUDIES: Chest x-ray portable single view, extensive airspace disease in the right upper and middle lobe suggesting pneumonia with small pleural effusion.  Troponin less than 0.03. WBC 25.3, hemoglobin 11.6, hematocrit 36.4, platelets 307,000.  ABGs, pH 7.400, pCO2 of 49, pO2 of 54, FiO2 of 28%. LFTs, total protein 5.9, albumin 2.2, the rest of the LFTs are normal except ALT at 11. BMP, glucose 95, BUN 25, creatinine 0.75, sodium 134, potassium 3.7, chloride 96, CO2 of 30, anion gap 8. GFR greater than 60. Calcium 8.1. Lactic acid 1.3. A 12 lead EKG, normal sinus rhythm at 84 beats per minute, normal PR and QRS interval, no acute ST wave changes.   ASSESSMENT AND PLAN: A 74 year old Caucasian female, came into the ED with worsening of shortness of breath, failed outpatient treatment with amoxicillin for pneumonia, will be admitted with the following assessment and plan:   1.  Acute hypoxic respiratory failure secondary to healthcare associated-pneumonia. We will admit her to medical/surgical floor. The patient will be monitored on telemetry.  As she failed outpatient antibiotics will cover her with IV Zosyn, Levaquin, and vancomycin. Will follow up on the sputum culture and sensitivity.  2.  Chronic history of chronic obstructive pulmonary disease. We will continue her home medications. Currently she is not in exacerbation. We will provide nebulizer treatments as needed basis. I will continue home medication Advair, Singulair.  3.  History of hypertension. Continue atenolol.  4.  History of congestive heart failure, not on any diuretics at this time. We will continue close monitoring for symptoms and signs of fluid overload.  5.  Chronic history of atrial fibrillation. The patient is not on any medications for chronic history of atrial fibrillation, I am not quite sure whether she has any contraindication.  6.  Hypertension. Continue atenolol.  7.  Anxiety.  Continue alprazolam.  8.  Smoking.  She smokes just 1 cigarette on alternate days. I consult to quit smoking for 3-5 minutes. She verbalized understanding.  9.  We will provide gastrointestinal and deep vein thrombosis prophylaxis.  10.  The patient will be transferred to Dr. Ramonita Lab.    TOTAL TIME SPENT ON ADMISSION: 50 minutes.   Plan of care discussed with the patient and her husband. She is full code. Husband is the medical power of attorney.    ____________________________ Nicholes Mango, MD ag:bu D: 03/06/2015 13:58:27 ET T: 03/06/2015 14:35:36 ET JOB#: [id number]  cc: Nicholes Mango, MD, <Dictator> Adin Hector, MD Nicholes Mango MD ELECTRONICALLY SIGNED 03/11/2015 17:59

## 2015-03-19 NOTE — Consult Note (Signed)
PATIENT NAME:  Robin Hudson, Robin Hudson MR#:  [id number] DATE OF BIRTH:  1941-01-16  DATE OF CONSULTATION:  03/06/2015  REFERRING PHYSICIAN:  Adin Hector, MD CONSULTING PHYSICIAN:  Corey Skains, MD  REASON FOR CONSULTATION: Elevated troponin with chronic atrial fibrillation, hypertension, and shortness of breath.   CHIEF COMPLAINT: "I'm short of breath."   HISTORY OF PRESENT ILLNESS:  This is a 74 year old female with known chronic nonvalvular atrial fibrillation on appropriate medication management for heart rate control which is reasonable today. She also has had no anticoagulation due to the patient's wishes. She has not had any issues with hypertension with good heart rate and blood pressure control recently with medications. The patient has had recent chest discomfort and shortness of breath with an elevated white blood cell count consistent with pneumonia.  This is significantly increasing in frequency and intensity over the last several weeks, but mainly over the last 2 days. She does still smoke for which we have counseled her on the discontinuation of tobacco abuse and the significant hazards and the patient does have an elevated troponin of 0.1 most consistent with demand ischemia without evidence of acute coronary syndrome. The patient currently is slightly improved with shortness of breath.   REVIEW OF SYSTEMS: The remainder of review negative for vision change, ringing in the ears, hearing loss, heartburn, nausea, vomiting, diarrhea, bloody stools, stomach pain, extremity pain, leg weakness, cramping of the buttocks, known blood clots, headaches, blackouts, dizzy spells, nosebleeds, frequent urination, urination at night, muscle weakness, numbness, anxiety, depression, skin lesions, skin rashes.   PAST MEDICAL HISTORY:  1.  Chronic nonvalvular atrial fibrillation.  2.  Hypertension.  3.  Hyperlipidemia.   FAMILY HISTORY: No family members early onset of cardiovascular disease, but  multiple with hypertension.   SOCIAL HISTORY: Currently denies alcohol use, but does still smoke.   ALLERGIES: As listed.   MEDICATIONS: As listed.   PHYSICAL EXAMINATION:  VITAL SIGNS: Blood pressure is 110/68 bilaterally, heart rate is 78 upright, reclining, and irregular.  GENERAL: She is a well-appearing elderly female in no acute distress.  HEENT: No icterus, thyromegaly, ulcers, hemorrhage, or xanthelasma.  CARDIOVASCULAR: Irregularly irregular. Normal S1 and S2, 2/6 apical murmur consistent with mitral regurgitation. PMI is inferiorly displaced. Carotid upstroke normal without bruit. Jugular venous pressure is not seen.  LUNGS: Have expiratory wheezes and a few rhonchi.  ABDOMEN: Soft, nontender without hepatosplenomegaly or masses. Abdominal aorta is normal size without bruit.  EXTREMITIES: Show 2+ radial, femoral, dorsal pedal pulses with trace lower extremity edema. No cyanosis, clubbing, or ulcers.  NEUROLOGIC: She is oriented to time, place, and person with normal mood and affect.   ASSESSMENT: A 74 year old female with chronic nonvalvular atrial fibrillation, essential hypertension with shortness of breath and new onset of pneumonia with elevated troponin most consistent with demand ischemia rather than acute coronary syndrome.   RECOMMENDATIONS:  1.  Continue atenolol for heart rate control of chronic nonvalvular atrial fibrillation.  2.  Continue hypertension control with medication management as before.  3.  No anticoagulation of atrial fibrillation for further risk reduction in stroke due to the patient's wishes.  4.  Echocardiogram for left ventricular systolic dysfunction, valvular heart disease contributing to above shortness of breath and need for adjustments of medications.  5.  Further treatment options after above.   ____________________________ Corey Skains, MD bjk:sp D: 03/06/2015 16:54:30 ET T: 03/06/2015 17:16:05 ET JOB#: [id number]  cc: Corey Skains, MD, <Dictator> Corey Skains  MD ELECTRONICALLY SIGNED 03/08/2015 8:58

## 2015-03-31 ENCOUNTER — Other Ambulatory Visit: Payer: Self-pay | Admitting: Internal Medicine

## 2015-03-31 DIAGNOSIS — J189 Pneumonia, unspecified organism: Secondary | ICD-10-CM

## 2015-04-06 ENCOUNTER — Ambulatory Visit: Payer: PPO

## 2015-04-07 ENCOUNTER — Ambulatory Visit
Admission: RE | Admit: 2015-04-07 | Discharge: 2015-04-07 | Disposition: A | Discharge status: Home / Self Care | Payer: PPO | Source: Ambulatory Visit | Attending: Internal Medicine | Admitting: Internal Medicine

## 2015-04-07 DIAGNOSIS — J189 Pneumonia, unspecified organism: Secondary | ICD-10-CM | POA: Diagnosis present

## 2015-04-18 ENCOUNTER — Other Ambulatory Visit: Payer: Self-pay

## 2015-04-18 ENCOUNTER — Emergency Department
Admission: EM | Admit: 2015-04-18 | Discharge: 2015-04-18 | Disposition: A | Discharge status: Home / Self Care | Payer: PPO | Attending: Emergency Medicine | Admitting: Emergency Medicine

## 2015-04-18 ENCOUNTER — Encounter: Payer: Self-pay | Admitting: Emergency Medicine

## 2015-04-18 ENCOUNTER — Emergency Department: Payer: PPO

## 2015-04-18 DIAGNOSIS — Z720 Tobacco use: Secondary | ICD-10-CM | POA: Diagnosis not present

## 2015-04-18 DIAGNOSIS — R0602 Shortness of breath: Secondary | ICD-10-CM

## 2015-04-18 DIAGNOSIS — J441 Chronic obstructive pulmonary disease with (acute) exacerbation: Secondary | ICD-10-CM | POA: Diagnosis not present

## 2015-04-18 HISTORY — DX: Malignant neoplasm of colon, unspecified: C18.9

## 2015-04-18 HISTORY — DX: Chronic obstructive pulmonary disease, unspecified: J44.9

## 2015-04-18 HISTORY — DX: Essential (primary) hypertension: I10

## 2015-04-18 LAB — CBC WITH DIFFERENTIAL/PLATELET
BASOS PCT: 1 %
Basophils Absolute: 0.1 10*3/uL (ref 0–0.1)
EOS ABS: 0.1 10*3/uL (ref 0–0.7)
Eosinophils Relative: 1 %
HEMATOCRIT: 37.8 % (ref 35.0–47.0)
HEMOGLOBIN: 12.4 g/dL (ref 12.0–16.0)
Lymphocytes Relative: 15 %
Lymphs Abs: 1.5 10*3/uL (ref 1.0–3.6)
MCH: 27.2 pg (ref 26.0–34.0)
MCHC: 32.7 g/dL (ref 32.0–36.0)
MCV: 83 fL (ref 80.0–100.0)
MONO ABS: 0.5 10*3/uL (ref 0.2–0.9)
Monocytes Relative: 5 %
NEUTROS ABS: 8.3 10*3/uL — AB (ref 1.4–6.5)
NEUTROS PCT: 78 %
Platelets: 255 10*3/uL (ref 150–440)
RBC: 4.55 MIL/uL (ref 3.80–5.20)
RDW: 15.5 % — ABNORMAL HIGH (ref 11.5–14.5)
WBC: 10.5 10*3/uL (ref 3.6–11.0)

## 2015-04-18 LAB — BASIC METABOLIC PANEL
Anion gap: 12 (ref 5–15)
BUN: 10 mg/dL (ref 6–20)
CO2: 26 mmol/L (ref 22–32)
Calcium: 9.2 mg/dL (ref 8.9–10.3)
Chloride: 100 mmol/L — ABNORMAL LOW (ref 101–111)
Creatinine, Ser: 0.78 mg/dL (ref 0.44–1.00)
GFR calc Af Amer: >60 mL/min (ref >60)
GFR calc non Af Amer: >60 mL/min (ref >60)
Glucose, Bld: 107 mg/dL — ABNORMAL HIGH (ref 65–99)
POTASSIUM: 4.2 mmol/L (ref 3.5–5.1)
Sodium: 138 mmol/L (ref 135–145)

## 2015-04-18 LAB — BRAIN NATRIURETIC PEPTIDE: B NATRIURETIC PEPTIDE 5: 168 pg/mL — AB (ref 0.0–100.0)

## 2015-04-18 LAB — TROPONIN I: Troponin I: <0.03 ng/mL (ref <0.031)

## 2015-04-18 MED ORDER — IPRATROPIUM-ALBUTEROL 0.5-2.5 (3) MG/3ML IN SOLN
RESPIRATORY_TRACT | Status: AC
  Administered 2015-04-18: 3 mL via RESPIRATORY_TRACT
  Filled 2015-04-18: qty 3

## 2015-04-18 MED ORDER — IPRATROPIUM-ALBUTEROL 0.5-2.5 (3) MG/3ML IN SOLN
3.0000 mL | Freq: Once | RESPIRATORY_TRACT | Status: AC
  Administered 2015-04-18: 3 mL via RESPIRATORY_TRACT

## 2015-04-18 NOTE — ED Notes (Signed)
Pt taking PO well with no vomiting

## 2015-04-18 NOTE — ED Provider Notes (Signed)
[facility] Emergency Department Provider Note  ____________________________________________  Time seen: On arrival  I have reviewed the triage vital signs and the nursing notes.   HISTORY  Chief Complaint Shortness of Breath      HPI Robin Hudson is a 74 y.o. female who presents with worsening shortness of breath. She uses home O2 2 L as needed and she has had to use it more frequently than normal. She is still recovering from pneumonia and is still on antibiotics. She denies chest pain and reports actually her breathing is better in the emergency department and it was this morning. He describes the severity as moderate area she has a history of COPD and bronchiectasis. She does report mild cough     Past Medical History  Diagnosis Date  . COPD (chronic obstructive pulmonary disease)   . Hypertension   . Breast CA   . Colon cancer     Patient Active Problem List   Diagnosis Date Noted  . Closed head injury 12/15/2014    Past Surgical History  Procedure Laterality Date  . Abdominal hysterectomy      No current outpatient prescriptions on file.  Allergies Coreg  History reviewed. No pertinent family history.  Social History History  Substance Use Topics  . Smoking status: Current Every Day Smoker  . Smokeless tobacco: Not on file  . Alcohol Use: No    Review of Systems  Constitutional: Negative for fever. Eyes: Negative for visual changes. ENT: Negative for sore throat Cardiovascular: Negative for chest pain. Respiratory: Positive for shortness of breath Gastrointestinal: Negative for abdominal pain, vomiting and diarrhea. Genitourinary: Negative for dysuria. Musculoskeletal: Negative for back pain. Skin: Negative for rash. Neurological: Negative for headaches or focal weakness Psychiatric: No anxiety  10-point ROS otherwise negative.  ____________________________________________   PHYSICAL EXAM:  VITAL SIGNS: ED  Triage Vitals  Enc Vitals Group     BP 04/18/15 0910 146/72 mmHg     Pulse Rate 04/18/15 0910 71     Resp 04/18/15 0910 20     Temp 04/18/15 0910 98.9 F (37.2 C)     Temp Source 04/18/15 0910 Oral     SpO2 04/18/15 0910 96 %     Weight 04/18/15 0910 120 lb (54.432 kg)     Height 04/18/15 0910 '5\' 5"'$  (1.651 m)     Head Cir --      Peak Flow --      Pain Score --      Pain Loc --      Pain Edu? --      Excl. in [place]? --      Constitutional: Alert and oriented. Well appearing and in no distress. Pleasant Eyes: Conjunctivae are normal. PERRL. ENT   Head: Normocephalic and atraumatic.   Nose: No rhinnorhea.   Mouth/Throat: Mucous membranes are moist. Cardiovascular: Normal rate, regular rhythm. Normal and symmetric distal pulses are present in all extremities. No murmurs, rubs, or gallops. Respiratory: Crackles left lower lobe  Gastrointestinal: Soft and non-tender in all quadrants. No distention. There is no CVA tenderness. Genitourinary: deferred Musculoskeletal: Nontender with normal range of motion in all extremities. No lower extremity tenderness nor edema. Neurologic:  Normal speech and language. No gross focal neurologic deficits are appreciated. Skin:  Skin is warm, dry and intact. No rash noted. Psychiatric: Mood and affect are normal. Patient exhibits appropriate insight and judgment.  ____________________________________________    LABS (pertinent positives/negatives)  Labs Reviewed  CBC WITH DIFFERENTIAL/PLATELET -  Abnormal; Notable for the following:    RDW 15.5 (*)    Neutro Abs 8.3 (*)    All other components within normal limits  BASIC METABOLIC PANEL - Abnormal; Notable for the following:    Chloride 100 (*)    Glucose, Bld 107 (*)    All other components within normal limits  BRAIN NATRIURETIC PEPTIDE - Abnormal; Notable for the following:    B Natriuretic Peptide 168.0 (*)    All other components within normal limits  TROPONIN I     ____________________________________________   EKG  ED ECG REPORT I, Lavonia Drafts, the attending physician, personally viewed and interpreted this ECG.   Date: 04/18/2015  EKG Time: 9:05 AM  Rate: 64  Rhythm: Normal sinus rhythm  Axis: Normal  Intervals:none  ST&T Change: Normal   ____________________________________________    RADIOLOGY  X-ray shows improvement from prior studies  ____________________________________________   PROCEDURES  Procedure(s) performed: none  Critical Care performed: none  ____________________________________________   INITIAL IMPRESSION / ASSESSMENT AND PLAN / ED COURSE  Pertinent labs & imaging results that were available during my care of the patient were reviewed by me and considered in my medical decision making (see chart for details).  Overall patient well-appearing. Oxygenating well in the emergency department. Chest x-ray shows improving disease although irregular density noted and this will be discussed with primary care physician. Patient is on antibiotics. Will discuss with her primary care physician  ----------------------------------------- 1:10 PM on 04/18/2015 -----------------------------------------  Discussed with patient's primary care physician Dr. Caryl Comes he'll see patient in one day.  ____________________________________________   FINAL CLINICAL IMPRESSION(S) / ED DIAGNOSES  Final diagnoses:  Shortness of breath     Lavonia Drafts, MD 04/18/15 1313

## 2015-04-18 NOTE — Discharge Instructions (Signed)

## 2015-04-18 NOTE — ED Notes (Signed)
Pt wears home O2, states her sob has been worse for 2 days and chills/sweats

## 2015-04-20 ENCOUNTER — Other Ambulatory Visit: Payer: Self-pay | Admitting: Internal Medicine

## 2015-04-20 DIAGNOSIS — R911 Solitary pulmonary nodule: Secondary | ICD-10-CM

## 2015-04-27 ENCOUNTER — Ambulatory Visit: Payer: PPO | Admitting: Cardiothoracic Surgery

## 2015-05-08 ENCOUNTER — Ambulatory Visit
Admission: RE | Admit: 2015-05-08 | Discharge: 2015-05-08 | Disposition: A | Discharge status: Home / Self Care | Payer: PPO | Source: Ambulatory Visit | Attending: Internal Medicine | Admitting: Internal Medicine

## 2015-05-08 DIAGNOSIS — R911 Solitary pulmonary nodule: Secondary | ICD-10-CM | POA: Insufficient documentation

## 2015-05-08 DIAGNOSIS — I2510 Atherosclerotic heart disease of native coronary artery without angina pectoris: Secondary | ICD-10-CM | POA: Diagnosis not present

## 2015-05-08 HISTORY — DX: Heart failure, unspecified: I50.9

## 2015-05-08 HISTORY — DX: Unspecified asthma, uncomplicated: J45.909

## 2015-05-11 ENCOUNTER — Inpatient Hospital Stay: Payer: PPO | Attending: Cardiothoracic Surgery | Admitting: Cardiothoracic Surgery

## 2015-05-12 ENCOUNTER — Inpatient Hospital Stay
Admission: EM | Admit: 2015-05-12 | Discharge: 2015-05-18 | DRG: 193 | Disposition: A | Discharge status: Home / Self Care | Payer: PPO | Attending: Internal Medicine | Admitting: Internal Medicine

## 2015-05-12 ENCOUNTER — Encounter: Payer: Self-pay | Admitting: Emergency Medicine

## 2015-05-12 ENCOUNTER — Emergency Department: Payer: PPO

## 2015-05-12 DIAGNOSIS — I509 Heart failure, unspecified: Secondary | ICD-10-CM | POA: Diagnosis present

## 2015-05-12 DIAGNOSIS — F17210 Nicotine dependence, cigarettes, uncomplicated: Secondary | ICD-10-CM | POA: Diagnosis present

## 2015-05-12 DIAGNOSIS — M810 Age-related osteoporosis without current pathological fracture: Secondary | ICD-10-CM | POA: Diagnosis present

## 2015-05-12 DIAGNOSIS — Z853 Personal history of malignant neoplasm of breast: Secondary | ICD-10-CM | POA: Diagnosis not present

## 2015-05-12 DIAGNOSIS — J15212 Pneumonia due to Methicillin resistant Staphylococcus aureus: Secondary | ICD-10-CM | POA: Insufficient documentation

## 2015-05-12 DIAGNOSIS — E538 Deficiency of other specified B group vitamins: Secondary | ICD-10-CM | POA: Insufficient documentation

## 2015-05-12 DIAGNOSIS — C50919 Malignant neoplasm of unspecified site of unspecified female breast: Secondary | ICD-10-CM | POA: Insufficient documentation

## 2015-05-12 DIAGNOSIS — F411 Generalized anxiety disorder: Secondary | ICD-10-CM | POA: Diagnosis present

## 2015-05-12 DIAGNOSIS — E278 Other specified disorders of adrenal gland: Secondary | ICD-10-CM | POA: Insufficient documentation

## 2015-05-12 DIAGNOSIS — J441 Chronic obstructive pulmonary disease with (acute) exacerbation: Secondary | ICD-10-CM | POA: Diagnosis present

## 2015-05-12 DIAGNOSIS — J9600 Acute respiratory failure, unspecified whether with hypoxia or hypercapnia: Secondary | ICD-10-CM | POA: Diagnosis present

## 2015-05-12 DIAGNOSIS — E785 Hyperlipidemia, unspecified: Secondary | ICD-10-CM | POA: Diagnosis present

## 2015-05-12 DIAGNOSIS — J309 Allergic rhinitis, unspecified: Secondary | ICD-10-CM | POA: Insufficient documentation

## 2015-05-12 DIAGNOSIS — I10 Essential (primary) hypertension: Secondary | ICD-10-CM | POA: Diagnosis present

## 2015-05-12 DIAGNOSIS — R768 Other specified abnormal immunological findings in serum: Secondary | ICD-10-CM | POA: Diagnosis present

## 2015-05-12 DIAGNOSIS — J449 Chronic obstructive pulmonary disease, unspecified: Secondary | ICD-10-CM | POA: Diagnosis present

## 2015-05-12 DIAGNOSIS — J45909 Unspecified asthma, uncomplicated: Secondary | ICD-10-CM | POA: Diagnosis present

## 2015-05-12 DIAGNOSIS — Z85038 Personal history of other malignant neoplasm of large intestine: Secondary | ICD-10-CM | POA: Diagnosis not present

## 2015-05-12 DIAGNOSIS — C189 Malignant neoplasm of colon, unspecified: Secondary | ICD-10-CM | POA: Insufficient documentation

## 2015-05-12 DIAGNOSIS — J189 Pneumonia, unspecified organism: Secondary | ICD-10-CM | POA: Diagnosis not present

## 2015-05-12 DIAGNOSIS — R0602 Shortness of breath: Secondary | ICD-10-CM | POA: Diagnosis present

## 2015-05-12 HISTORY — DX: Age-related osteoporosis without current pathological fracture: M81.0

## 2015-05-12 HISTORY — DX: Malignant neoplasm of unspecified site of unspecified female breast: C50.919

## 2015-05-12 HISTORY — DX: Allergic rhinitis, unspecified: J30.9

## 2015-05-12 HISTORY — DX: Deficiency of other specified B group vitamins: E53.8

## 2015-05-12 HISTORY — DX: Hyperlipidemia, unspecified: E78.5

## 2015-05-12 HISTORY — DX: Pneumonia due to methicillin resistant Staphylococcus aureus: J15.212

## 2015-05-12 HISTORY — DX: Other specified disorders of adrenal gland: E27.8

## 2015-05-12 LAB — CBC
HEMATOCRIT: 37 % (ref 35.0–47.0)
HEMOGLOBIN: 11.9 g/dL — AB (ref 12.0–16.0)
MCH: 27.3 pg (ref 26.0–34.0)
MCHC: 32.2 g/dL (ref 32.0–36.0)
MCV: 84.7 fL (ref 80.0–100.0)
Platelets: 210 10*3/uL (ref 150–440)
RBC: 4.37 MIL/uL (ref 3.80–5.20)
RDW: 15.7 % — ABNORMAL HIGH (ref 11.5–14.5)
WBC: 20.2 10*3/uL — ABNORMAL HIGH (ref 3.6–11.0)

## 2015-05-12 LAB — BASIC METABOLIC PANEL
ANION GAP: 11 (ref 5–15)
BUN: 20 mg/dL (ref 6–20)
CALCIUM: 9.3 mg/dL (ref 8.9–10.3)
CHLORIDE: 95 mmol/L — AB (ref 101–111)
CO2: 28 mmol/L (ref 22–32)
CREATININE: 0.76 mg/dL (ref 0.44–1.00)
GFR calc Af Amer: >60 mL/min (ref >60)
GFR calc non Af Amer: >60 mL/min (ref >60)
GLUCOSE: 112 mg/dL — AB (ref 65–99)
Potassium: 4.1 mmol/L (ref 3.5–5.1)
Sodium: 134 mmol/L — ABNORMAL LOW (ref 135–145)

## 2015-05-12 LAB — MRSA PCR SCREENING: MRSA by PCR: NEGATIVE

## 2015-05-12 LAB — TROPONIN I: Troponin I: <0.03 ng/mL (ref <0.031)

## 2015-05-12 MED ORDER — SODIUM CHLORIDE 0.9 % IV SOLN
1000.0000 mL | Freq: Once | INTRAVENOUS | Status: AC
  Administered 2015-05-12: 1000 mL via INTRAVENOUS

## 2015-05-12 MED ORDER — ONDANSETRON HCL 4 MG PO TABS: 4.0000 mg | ORAL_TABLET | Freq: Four times a day (QID) | ORAL | Status: DC | PRN

## 2015-05-12 MED ORDER — VANCOMYCIN HCL IN DEXTROSE 750-5 MG/150ML-% IV SOLN
750.0000 mg | Freq: Two times a day (BID) | INTRAVENOUS | Status: DC
  Administered 2015-05-13: 03:00:00 750 mg via INTRAVENOUS
  Filled 2015-05-12 (×5): qty 150

## 2015-05-12 MED ORDER — PIPERACILLIN-TAZOBACTAM 3.375 G IVPB
INTRAVENOUS | Status: AC
  Filled 2015-05-12: qty 50

## 2015-05-12 MED ORDER — MOMETASONE FURO-FORMOTEROL FUM 100-5 MCG/ACT IN AERO
2.0000 | INHALATION_SPRAY | Freq: Two times a day (BID) | RESPIRATORY_TRACT | Status: DC
  Administered 2015-05-12 – 2015-05-18 (×12): 2 via RESPIRATORY_TRACT
  Filled 2015-05-12: qty 8.8

## 2015-05-12 MED ORDER — SODIUM CHLORIDE 0.9 % IJ SOLN
3.0000 mL | INTRAMUSCULAR | Status: DC | PRN
  Administered 2015-05-13 – 2015-05-15 (×3): 3 mL via INTRAVENOUS
  Filled 2015-05-12 (×3): qty 10

## 2015-05-12 MED ORDER — HYDROCODONE-ACETAMINOPHEN 5-325 MG PO TABS
ORAL_TABLET | ORAL | Status: AC
  Administered 2015-05-12: 1 via ORAL
  Filled 2015-05-12: qty 1

## 2015-05-12 MED ORDER — ALBUTEROL SULFATE (2.5 MG/3ML) 0.083% IN NEBU
2.5000 mg | INHALATION_SOLUTION | Freq: Four times a day (QID) | RESPIRATORY_TRACT | Status: DC
  Administered 2015-05-12: 20:00:00 2.5 mg via RESPIRATORY_TRACT
  Filled 2015-05-12: qty 3

## 2015-05-12 MED ORDER — VANCOMYCIN HCL IN DEXTROSE 1-5 GM/200ML-% IV SOLN
INTRAVENOUS | Status: AC
  Filled 2015-05-12: qty 200

## 2015-05-12 MED ORDER — ONDANSETRON HCL 4 MG/2ML IJ SOLN: 4.0000 mg | Freq: Four times a day (QID) | INTRAMUSCULAR | Status: DC | PRN

## 2015-05-12 MED ORDER — ENOXAPARIN SODIUM 40 MG/0.4ML ~~LOC~~ SOLN
40.0000 mg | SUBCUTANEOUS | Status: DC
  Administered 2015-05-12 – 2015-05-16 (×5): 40 mg via SUBCUTANEOUS
  Filled 2015-05-12 (×4): qty 0.4

## 2015-05-12 MED ORDER — TIOTROPIUM BROMIDE MONOHYDRATE 18 MCG IN CAPS
18.0000 ug | ORAL_CAPSULE | Freq: Every day | RESPIRATORY_TRACT | Status: DC
  Administered 2015-05-12 – 2015-05-18 (×7): 18 ug via RESPIRATORY_TRACT
  Filled 2015-05-12 (×3): qty 5

## 2015-05-12 MED ORDER — MONTELUKAST SODIUM 10 MG PO TABS
10.0000 mg | ORAL_TABLET | Freq: Every day | ORAL | Status: DC
  Administered 2015-05-12 – 2015-05-17 (×6): 10 mg via ORAL
  Filled 2015-05-12 (×6): qty 1

## 2015-05-12 MED ORDER — ESCITALOPRAM OXALATE 10 MG PO TABS
10.0000 mg | ORAL_TABLET | Freq: Every day | ORAL | Status: DC
  Administered 2015-05-12 – 2015-05-17 (×6): 10 mg via ORAL
  Filled 2015-05-12 (×6): qty 1

## 2015-05-12 MED ORDER — IPRATROPIUM-ALBUTEROL 0.5-2.5 (3) MG/3ML IN SOLN
3.0000 mL | Freq: Once | RESPIRATORY_TRACT | Status: AC
  Administered 2015-05-12: 3 mL via RESPIRATORY_TRACT

## 2015-05-12 MED ORDER — ATENOLOL 50 MG PO TABS
50.0000 mg | ORAL_TABLET | Freq: Every day | ORAL | Status: DC
  Administered 2015-05-13 – 2015-05-17 (×5): 50 mg via ORAL
  Filled 2015-05-12 (×5): qty 1

## 2015-05-12 MED ORDER — PIPERACILLIN-TAZOBACTAM 3.375 G IVPB
3.3750 g | Freq: Once | INTRAVENOUS | Status: AC
  Administered 2015-05-12: 3.375 g via INTRAVENOUS

## 2015-05-12 MED ORDER — METHYLPREDNISOLONE SODIUM SUCC 125 MG IJ SOLR
60.0000 mg | Freq: Four times a day (QID) | INTRAMUSCULAR | Status: DC
  Administered 2015-05-12: 20:00:00 60 mg via INTRAVENOUS
  Administered 2015-05-12: 17:00:00 via INTRAVENOUS
  Administered 2015-05-13 – 2015-05-15 (×9): 60 mg via INTRAVENOUS
  Filled 2015-05-12 (×13): qty 2

## 2015-05-12 MED ORDER — ACETAMINOPHEN 650 MG RE SUPP: 650.0000 mg | Freq: Four times a day (QID) | RECTAL | Status: DC | PRN

## 2015-05-12 MED ORDER — VANCOMYCIN HCL IN DEXTROSE 1-5 GM/200ML-% IV SOLN
1000.0000 mg | Freq: Once | INTRAVENOUS | Status: AC
  Administered 2015-05-12: 1000 mg via INTRAVENOUS

## 2015-05-12 MED ORDER — SODIUM CHLORIDE 0.9 % IV SOLN: 250.0000 mL | INTRAVENOUS | Status: DC | PRN

## 2015-05-12 MED ORDER — FLUTICASONE PROPIONATE 50 MCG/ACT NA SUSP
1.0000 | Freq: Every day | NASAL | Status: DC
  Administered 2015-05-13 – 2015-05-17 (×5): 1 via NASAL
  Filled 2015-05-12: qty 16

## 2015-05-12 MED ORDER — IPRATROPIUM BROMIDE 0.02 % IN SOLN
0.5000 mg | Freq: Four times a day (QID) | RESPIRATORY_TRACT | Status: DC
Start: 2015-05-13 — End: 2015-05-13
  Administered 2015-05-13: 08:00:00 0.5 mg via RESPIRATORY_TRACT
  Filled 2015-05-12: qty 2.5

## 2015-05-12 MED ORDER — IPRATROPIUM BROMIDE 0.02 % IN SOLN
0.5000 mg | Freq: Four times a day (QID) | RESPIRATORY_TRACT | Status: DC
  Administered 2015-05-12: 20:00:00 0.5 mg via RESPIRATORY_TRACT
  Filled 2015-05-12: qty 2.5

## 2015-05-12 MED ORDER — ACETAMINOPHEN 325 MG PO TABS: 650.0000 mg | ORAL_TABLET | Freq: Four times a day (QID) | ORAL | Status: DC | PRN

## 2015-05-12 MED ORDER — PIPERACILLIN-TAZOBACTAM 3.375 G IVPB
3.3750 g | Freq: Three times a day (TID) | INTRAVENOUS | Status: DC
  Administered 2015-05-12 – 2015-05-17 (×14): 3.375 g via INTRAVENOUS
  Filled 2015-05-12 (×15): qty 50

## 2015-05-12 MED ORDER — ALBUTEROL SULFATE (2.5 MG/3ML) 0.083% IN NEBU
2.5000 mg | INHALATION_SOLUTION | Freq: Four times a day (QID) | RESPIRATORY_TRACT | Status: DC
  Administered 2015-05-13: 2.5 mg via RESPIRATORY_TRACT
  Filled 2015-05-12: qty 3

## 2015-05-12 MED ORDER — HYDROCODONE-ACETAMINOPHEN 5-325 MG PO TABS
1.0000 | ORAL_TABLET | Freq: Once | ORAL | Status: AC
  Administered 2015-05-12: 1 via ORAL

## 2015-05-12 MED ORDER — SODIUM CHLORIDE 0.9 % IJ SOLN
3.0000 mL | Freq: Two times a day (BID) | INTRAMUSCULAR | Status: DC
  Administered 2015-05-12 – 2015-05-17 (×8): 3 mL via INTRAVENOUS

## 2015-05-12 MED ORDER — IPRATROPIUM-ALBUTEROL 0.5-2.5 (3) MG/3ML IN SOLN
RESPIRATORY_TRACT | Status: AC
  Filled 2015-05-12: qty 3

## 2015-05-12 MED ORDER — ALPRAZOLAM 0.5 MG PO TABS
0.5000 mg | ORAL_TABLET | Freq: Three times a day (TID) | ORAL | Status: DC | PRN
  Administered 2015-05-13 – 2015-05-17 (×7): 0.5 mg via ORAL
  Filled 2015-05-12 (×7): qty 1

## 2015-05-12 MED ORDER — HYDROCODONE-ACETAMINOPHEN 5-325 MG PO TABS
1.0000 | ORAL_TABLET | ORAL | Status: DC | PRN
  Administered 2015-05-12 (×2): 1 via ORAL
  Administered 2015-05-13 – 2015-05-14 (×3): 2 via ORAL
  Filled 2015-05-12: qty 1
  Filled 2015-05-12: qty 2
  Filled 2015-05-12: qty 1
  Filled 2015-05-12 (×2): qty 2

## 2015-05-12 NOTE — Progress Notes (Addendum)
ANTIBIOTIC CONSULT NOTE - INITIAL  Pharmacy Consult for Vancomycin  Indication: pneumonia  Allergies  Allergen Reactions  . Coreg [Carvedilol] Other (See Comments)    syncope    Patient Measurements: Height: '5\' 5"'$  (165.1 cm) Weight: 129 lb (58.514 kg) IBW/kg (Calculated) : 57   Vital Signs: Temp: 97.5 F (36.4 C) (06/24 1456) Temp Source: Oral (06/24 1456) BP: 97/42 mmHg (06/24 1456) Pulse Rate: 85 (06/24 1456) Intake/Output from previous day:   Intake/Output from this shift:    Labs:  Recent Labs  05/12/15 1113  WBC 20.2*  HGB 11.9*  PLT 210  CREATININE 0.76   Estimated Creatinine Clearance: 56.4 mL/min (by C-G formula based on Cr of 0.76). No results for input(s): VANCOTROUGH, VANCOPEAK, VANCORANDOM, GENTTROUGH, GENTPEAK, GENTRANDOM, TOBRATROUGH, TOBRAPEAK, TOBRARND, AMIKACINPEAK, AMIKACINTROU, AMIKACIN in the last 72 hours.   Microbiology: No results found for this or any previous visit (from the past 720 hour(s)).  Medical History: Past Medical History  Diagnosis Date  . COPD (chronic obstructive pulmonary disease)   . Hypertension   . Breast CA   . Colon cancer   . Asthma   . CHF (congestive heart failure)   . Hyperlipemia   . B12 deficiency   . Osteoporosis   . Right adrenal mass   . Allergic rhinitis   . Breast cancer   . Colon cancer   . MRSA pneumonia     Medications:  Scheduled:  . albuterol  2.5 mg Nebulization Q6H  . atenolol  50 mg Oral Daily  . enoxaparin (LOVENOX) injection  40 mg Subcutaneous Q24H  . escitalopram  10 mg Oral Daily  . fluticasone  1 spray Each Nare Daily  . ipratropium  0.5 mg Nebulization Q6H  . methylPREDNISolone (SOLU-MEDROL) injection  60 mg Intravenous Q6H  . mometasone-formoterol  2 puff Inhalation BID  . montelukast  10 mg Oral QHS  . piperacillin-tazobactam (ZOSYN)  IV  3.375 g Intravenous 3 times per day  . sodium chloride  3 mL Intravenous Q12H  . tiotropium  18 mcg Inhalation Daily  . vancomycin   750 mg Intravenous Q12H   Infusions:   Assessment: 74 yo female with history of MRSA PNA admitted with COPD exacerbation/PNA. Patient on day 1 of Zosyn EI 3.375g IV Q8hr and vancomycin '750mg'$  IV Q12hr for goal trough of 15-20. Patient also ordered methylprednisolone '60mg'$  IV q6hr.     Goal of Therapy:  Vancomycin trough level 15-20 mcg/ml  Plan:   Will continue patient on vancomycin '750mg'$  IV Q12hr for goal tough of 15-20. Will obtain trough prior to am dose on 6/26.   Please consider tapering steroids as clinically indicated.    Pharmacy will continue to monitor and adjust per consult.    Simpson,Michael L 05/12/2015,4:28 PM  Addendum:   Nurse requested rescheduling of 6/25 06:00 dose to 02:00 due to actual start time of 13:55 on 6/24.   Rescheduled trough for 6/26 at 13:30.

## 2015-05-12 NOTE — Plan of Care (Signed)
Problem: Discharge Progression Outcomes Goal: Discharge plan in place and appropriate Outcome: Not Progressing Patient admitted today with SOB and pneumonia.  Patient alert and oriented, moderate risk for falls due to generalized weakness, easy one assist.  Does have recent hx of MRSA pneumonia nostrils have been swabbed and sent and put on contact isolation.  Patient VSS with 3liter [place] on 2.5 l at home.  Already started on IV antibiotics.

## 2015-05-12 NOTE — ED Notes (Signed)
Pt to ed with c/o cough, congestion, fever and body aches for 2 days.  Pt states she was seen at [facility] about 2 days ago for same, however she has progressively gotten worse.  Pt on home O2 at 2.5 LPM.  Pt states sob is worse with activity.

## 2015-05-12 NOTE — H&P (Addendum)
[facility] [place] NAME: Robin Hudson    MR#:  [id number]  DATE OF BIRTH:  December 31, 1940  DATE OF ADMISSION:  05/12/2015  PRIMARY CARE PHYSICIAN: Adin Hector, MD   REQUESTING/REFERRING PHYSICIAN: Dr. Corky Downs  CHIEF COMPLAINT:   Chief Complaint  Patient presents with  . Cough  . Nasal Congestion  . Generalized Body Aches    HISTORY OF PRESENT ILLNESS: Robin Hudson  is a 74 y.o. female with a known history of COPD, hypertension, hyperlipidemia,  osteoporosis, and seasonal allergies who was hospitalized here more than a month ago for pneumonia and COPD exacerbation. Presents to the hospital complaining of worsening shortness of breath and generalized pain all over. Patient had a recent CT scan of the chest per PE protocol done on the 20th which showedAreas of parenchymal consolidation in the right upper lobe and both lower lobes are grossly unchanged from 04/07/2015. She was seen by her primary care provider. She states that she continued to have worse big symptoms therefore came to the emergency room. Patient reports that she's had a cough which is productive of greenish sputum since yesterday. Has had pain all over her body including her chest. Has felt cold but no fevers or chills.  PAST MEDICAL HISTORY:   Past Medical History  Diagnosis Date  . COPD (chronic obstructive pulmonary disease)   . Hypertension   . Breast CA   . Colon cancer   . Asthma   . CHF (congestive heart failure)   . Hyperlipemia   . B12 deficiency   . Osteoporosis   . Right adrenal mass   . Allergic rhinitis   . Breast cancer   . Colon cancer   . MRSA pneumonia     PAST SURGICAL HISTORY:  Past Surgical History  Procedure Laterality Date  . Abdominal hysterectomy    . Appendectomy    . Colectomy      partial  . Image guided sinus surgery      SOCIAL HISTORY:  History  Substance Use Topics  . Smoking status: Current Every Day Smoker -- 0.25  packs/day    Types: Cigarettes  . Smokeless tobacco: Never Used  . Alcohol Use: No    FAMILY HISTORY:  Family History  Problem Relation Age of Onset  . Heart attack Mother     DRUG ALLERGIES:  Ace inhibitors cough Coreg Cymbalta Fosamax Statins-hmg-coa reductase verelan      Home Medications:  Advair 250/50 one puff twice a day Albuterol 90 g inhalation 2 times every 6 hours as needed Xanax 0.5 one tab 2 times a day as needed Atenolol 50 once a day Vitamin D 1000 IUs once a day Lexapro 10 one tab by mouth daily fluticasone 50 g 1 spray to each nostril every day  Hydrocodone acetaminophen 10/325 one and a half to 1 tab 3 times a day as needed Singulair 10 mg once daily Spiriva 18 g daily REVIEW OF SYSTEMS:   CONSTITUTIONAL: No fever, positive fatigue or positive weakness.  EYES: No blurred or double vision.  EARS, NOSE, AND THROAT: No tinnitus or ear pain.  RESPIRATORY: Positive cough, positive shortness of breath, wheezing or hemoptysis.  CARDIOVASCULAR: Positive chest pain, orthopnea, edema.  GASTROINTESTINAL: No nausea, vomiting, diarrhea or abdominal pain.  GENITOURINARY: No dysuria, hematuria.  ENDOCRINE: No polyuria, nocturia,  HEMATOLOGY: No anemia, easy bruising or bleeding SKIN: No rash or lesion. MUSCULOSKELETAL: Positive joint pain ,  arthritis.  NEUROLOGIC: No tingling, numbness, weakness.  PSYCHIATRY: No anxiety or depression.     PHYSICAL EXAMINATION:   VITAL SIGNS: Blood pressure 103/50, pulse 85, temperature 97.7 F (36.5 C), temperature source Oral, resp. rate 31, height '5\' 5"'$  (1.651 m), weight 58.514 kg (129 lb), SpO2 98 %.  GENERAL:  74 y.o.-year-old patient lying in the bed with no acute distress.  EYES: Pupils equal, round, reactive to light and accommodation. No scleral icterus. Extraocular muscles intact.  HEENT: Head atraumatic, normocephalic. Oropharynx and nasopharynx clear.  NECK:  Supple, no jugular venous distention. No  thyroid enlargement, no tenderness.  LUNGS: Bilateral diminished breath sounds with rhonchi's no wheezing, rales,rhonchi or crepitation. No use of accessory muscles of respiration.  CARDIOVASCULAR: S1, S2 normal. No murmurs, rubs, or gallops.  ABDOMEN: Soft, nontender, nondistended. Bowel sounds present. No organomegaly or mass.  EXTREMITIES: No pedal edema, cyanosis, or clubbing.  NEUROLOGIC: Cranial nerves II through XII are intact. Muscle strength 5/5 in all extremities. Sensation intact. Gait not checked.  PSYCHIATRIC: The patient is alert and oriented x 3.  SKIN: No obvious rash, lesion, or ulcer.   LABORATORY PANEL:   CBC  Recent Labs Lab 05/12/15 1113  WBC 20.2*  HGB 11.9*  HCT 37.0  PLT 210  MCV 84.7  MCH 27.3  MCHC 32.2  RDW 15.7*   ------------------------------------------------------------------------------------------------------------------  Chemistries   Recent Labs Lab 05/12/15 1113  NA 134*  K 4.1  CL 95*  CO2 28  GLUCOSE 112*  BUN 20  CREATININE 0.76  CALCIUM 9.3   ------------------------------------------------------------------------------------------------------------------ estimated creatinine clearance is 56.4 mL/min (by C-G formula based on Cr of 0.76). ------------------------------------------------------------------------------------------------------------------ No results for input(s): TSH, T4TOTAL, T3FREE, THYROIDAB in the last 72 hours.  Invalid input(s): FREET3   Coagulation profile No results for input(s): INR, PROTIME in the last 168 hours. ------------------------------------------------------------------------------------------------------------------- No results for input(s): DDIMER in the last 72 hours. -------------------------------------------------------------------------------------------------------------------  Cardiac Enzymes  Recent Labs Lab 05/12/15 1113  TROPONINI <0.03    ------------------------------------------------------------------------------------------------------------------ Invalid input(s): POCBNP  ---------------------------------------------------------------------------------------------------------------  Urinalysis No results found for: COLORURINE, APPEARANCEUR, LABSPEC, PHURINE, GLUCOSEU, HGBUR, BILIRUBINUR, KETONESUR, PROTEINUR, UROBILINOGEN, NITRITE, LEUKOCYTESUR   RADIOLOGY: Dg Chest 2 View (if Patient Has Fever And/or Copd)  05/12/2015   CLINICAL DATA:  COPD.  Progressive shortness of breath.  EXAM: CHEST - 2 VIEW  COMPARISON:  CT chest 05/08/2015  FINDINGS: The heart size is normal. Airspace opacity in retraction in the right upper lobe is stable. The chronic interstitial disease is similar to the prior study. Superimposed airspace consolidation is present in the right upper lobe. Emphysematous changes are again seen. Surgical clips are present in the right axilla.  IMPRESSION: 1. Stable chronic interstitial coarsening 2. Superimposed right upper lobe airspace disease is noted. This raises concern for pneumonia. 3. Emphysema.   Electronically Signed   By: San Morelle M.D.   On: 05/12/2015 12:53    EKG: Orders placed or performed during the hospital encounter of 05/12/15  . ED EKG  (if patient has PMH of COPD)  . ED EKG  (if patient has PMH of COPD)    IMPRESSION AND PLAN: Patient is a 74 year old white female with history of COPD presents with shortness of breath  1. Acute respiratory failure: Due to acute on chronic COPD exasperation as well as pneumonia. There is a history of MRSA pneumonia therefore we will go ahead and treat patient with Zosyn and vancomycin. For her COPD exasperation place her on nebulizers, continue Spiriva and Advair substitute. 2. Hypertension  continue atenolol as taking at home  3. Generalized anxiety disorder continue alprazolam and Lexapro  4. Miscellaneous use Lovenox for DVT  prophylaxis  5. Nicotine addiction: 5mn spent smoking cessation done recommend she stop, she not intrested in any replacement therapy   All the records are reviewed and case discussed with ED provider. Management plans discussed with the patient, family and they are in agreement.  CODE STATUS: Full    TOTAL TIME TAKING CARE OF THIS PATIENT: 55 minutes.    PDustin FlockM.D on 05/12/2015 at 2:08 PM  Between 7am to 6pm - Pager - [phone]  After 6pm go to [email or web address] - password [email or web address] [facility]  Office  [phone] CC: Primary care physician; BAdin Hector MD

## 2015-05-12 NOTE — ED Provider Notes (Signed)
[facility] Emergency Department Provider Note  ____________________________________________  Time seen: On arrival  I have reviewed the triage vital signs and the nursing notes.   HISTORY  Chief Complaint Cough; Nasal Congestion; and Generalized Body Aches      HPI Robin Hudson is a 74 y.o. female presents with cough and worsening shortness of breath. Patient uses home O2 at 2.5 L as needed and usually at night. She had pneumonia several months ago which required hospitalization and has been following her physician to ensure clearance. She has CT scan several days ago but reports she has become worse in the last day or 2. She had a temperature of 99.7 last nightand feels diffuse weakness     Past Medical History  Diagnosis Date  . COPD (chronic obstructive pulmonary disease)   . Hypertension   . Breast CA   . Colon cancer   . Asthma   . CHF (congestive heart failure)     Patient Active Problem List   Diagnosis Date Noted  . Closed head injury 12/15/2014    Past Surgical History  Procedure Laterality Date  . Abdominal hysterectomy      No current outpatient prescriptions on file.  Allergies Coreg  History reviewed. No pertinent family history.  Social History History  Substance Use Topics  . Smoking status: Current Every Day Smoker  . Smokeless tobacco: Not on file  . Alcohol Use: No    Review of Systems  Constitutional: As above for fever, Eyes: Negative for visual changes. ENT: Negative for sore throat Cardiovascular: Negative for chest pain. Respiratory: As above for shortness of breath, positive for cough Gastrointestinal: Negative for abdominal pain, vomiting and diarrhea. Genitourinary: Negative for dysuria. Musculoskeletal: Negative for back pain. For muscle aches Skin: Negative for rash. Neurological: Negative for headaches or focal weakness Psychiatric: No anxiety  10-point ROS otherwise  negative.  ____________________________________________   PHYSICAL EXAM:  VITAL SIGNS: ED Triage Vitals  Enc Vitals Group     BP 05/12/15 1101 107/52 mmHg     Pulse Rate 05/12/15 1101 88     Resp 05/12/15 1101 20     Temp 05/12/15 1101 97.7 F (36.5 C)     Temp Source 05/12/15 1101 Oral     SpO2 05/12/15 1101 96 %     Weight 05/12/15 1101 129 lb (58.514 kg)     Height 05/12/15 1101 '5\' 5"'$  (1.651 m)     Head Cir --      Peak Flow --      Pain Score 05/12/15 1101 10     Pain Loc --      Pain Edu? --      Excl. in [place]? --      Constitutional: Alert and oriented. Ill-appearing Eyes: Conjunctivae are normal.  ENT   Head: Normocephalic and atraumatic.   Mouth/Throat: Mucous membranes are moist. Cardiovascular: Normal rate, regular rhythm. Normal and symmetric distal pulses are present in all extremities. No murmurs, rubs, or gallops. Respiratory: tachypnea, poor air movement b/l, scattered wheezes Gastrointestinal: Soft and non-tender in all quadrants. No distention. There is no CVA tenderness. Genitourinary: deferred Musculoskeletal: Nontender with normal range of motion in all extremities. No lower extremity tenderness nor edema. Neurologic:  Normal speech and language. No gross focal neurologic deficits are appreciated. Skin:  Skin is warm, dry and intact. No rash noted. Psychiatric: Mood and affect are normal. Patient exhibits appropriate insight and judgment.  ____________________________________________    LABS (pertinent positives/negatives)  Labs Reviewed  CBC - Abnormal; Notable for the following:    WBC 20.2 (*)    Hemoglobin 11.9 (*)    RDW 15.7 (*)    All other components within normal limits  BASIC METABOLIC PANEL - Abnormal; Notable for the following:    Sodium 134 (*)    Chloride 95 (*)    Glucose, Bld 112 (*)    All other components within normal limits  CULTURE, BLOOD (ROUTINE X 2)  CULTURE, BLOOD (ROUTINE X 2)  TROPONIN I     ____________________________________________   EKG  ED ECG REPORT I, Lavonia Drafts, the attending physician, personally viewed and interpreted this ECG.  Date: 05/12/2015 EKG Time: 11:09 AM Rate: 88 Rhythm: normal sinus rhythm QRS Axis: normal Intervals: normal ST/T Wave abnormalities: normal Conduction Disutrbances: none Narrative Interpretation: unremarkable   ____________________________________________    RADIOLOGY  Interstitial coarsening with superimposed pneumonia, x-ray reviewed by me  ____________________________________________   PROCEDURES  Procedure(s) performed: none  Critical Care performed: none  ____________________________________________   INITIAL IMPRESSION / ASSESSMENT AND PLAN / ED COURSE  Pertinent labs & imaging results that were available during my care of the patient were reviewed by me and considered in my medical decision making (see chart for details).  Patient with subjective fever, elevated white blood cell count, cough and shortness of breath with x-ray concerning for pneumonia. At one point she was admitted and there was concern for MRSA pneumonia we will cover with vancomycin and Zosyn and admit her to the hospitalist.  ____________________________________________   FINAL CLINICAL IMPRESSION(S) / ED DIAGNOSES  Final diagnoses:  Community acquired pneumonia     Lavonia Drafts, MD 05/12/15 1329

## 2015-05-13 LAB — CBC
HCT: 31.6 % — ABNORMAL LOW (ref 35.0–47.0)
HEMOGLOBIN: 10.3 g/dL — AB (ref 12.0–16.0)
MCH: 27.3 pg (ref 26.0–34.0)
MCHC: 32.4 g/dL (ref 32.0–36.0)
MCV: 84.2 fL (ref 80.0–100.0)
Platelets: 190 10*3/uL (ref 150–440)
RBC: 3.76 MIL/uL — AB (ref 3.80–5.20)
RDW: 15 % — ABNORMAL HIGH (ref 11.5–14.5)
WBC: 11.3 10*3/uL — ABNORMAL HIGH (ref 3.6–11.0)

## 2015-05-13 LAB — BASIC METABOLIC PANEL
Anion gap: 7 (ref 5–15)
BUN: 19 mg/dL (ref 6–20)
CALCIUM: 8.9 mg/dL (ref 8.9–10.3)
CHLORIDE: 100 mmol/L — AB (ref 101–111)
CO2: 29 mmol/L (ref 22–32)
CREATININE: 0.69 mg/dL (ref 0.44–1.00)
GFR calc non Af Amer: >60 mL/min (ref >60)
GLUCOSE: 169 mg/dL — AB (ref 65–99)
Potassium: 3.6 mmol/L (ref 3.5–5.1)
Sodium: 136 mmol/L (ref 135–145)

## 2015-05-13 MED ORDER — VANCOMYCIN HCL IN DEXTROSE 750-5 MG/150ML-% IV SOLN
750.0000 mg | Freq: Two times a day (BID) | INTRAVENOUS | Status: DC
  Administered 2015-05-13 – 2015-05-15 (×4): 750 mg via INTRAVENOUS
  Filled 2015-05-13 (×5): qty 150

## 2015-05-13 MED ORDER — ALBUTEROL SULFATE (2.5 MG/3ML) 0.083% IN NEBU
2.5000 mg | INHALATION_SOLUTION | Freq: Three times a day (TID) | RESPIRATORY_TRACT | Status: DC
  Administered 2015-05-13 – 2015-05-16 (×10): 2.5 mg via RESPIRATORY_TRACT
  Filled 2015-05-13 (×10): qty 3

## 2015-05-13 NOTE — Plan of Care (Signed)
Problem: Discharge Progression Outcomes Goal: Discharge plan in place and appropriate Outcome: Progressing Patient is alert and oriented, independent in room. Xanax given for anxiety, Norco for back pain with improvement. Continue with IV antibiotics and 3 L [place].

## 2015-05-13 NOTE — Plan of Care (Addendum)
Problem: Discharge Progression Outcomes Goal: Discharge plan in place and appropriate Outcome: Progressing Progress to goals: 1. Continues IV Zosyn and Vancomycin with IV solumedrol therapy with pt reporting no respiratory issues, on room air, productive cough improving with sputum production less with less color.  VSS on 02 @ 2l/[place]. Sputum cup at bedside with pt education done to collect next sputum production. Up in room and tolerated well. States feeling better overall.

## 2015-05-13 NOTE — Progress Notes (Addendum)
[place] PROGRESS NOTE Date of Admission:  05/12/2015     ID: Robin Hudson is a 74 y.o. female with  COPD and PNA  Active Problems:   SOB (shortness of breath)   Subjective: Less sob, still with cough. No fevers  ROS  Eleven systems are reviewed and negative except per hpi  Medications:  Antibiotics Given (last 72 hours)    Date/Time Action Medication Dose Rate   05/12/15 2313 Given   piperacillin-tazobactam (ZOSYN) IVPB 3.375 g 3.375 g 12.5 mL/hr   05/13/15 0314 Given   vancomycin (VANCOCIN) IVPB 750 mg/150 ml premix 750 mg 150 mL/hr   05/13/15 0614 Given   piperacillin-tazobactam (ZOSYN) IVPB 3.375 g 3.375 g 12.5 mL/hr     . albuterol  2.5 mg Nebulization TID  . atenolol  50 mg Oral Daily  . enoxaparin (LOVENOX) injection  40 mg Subcutaneous Q24H  . escitalopram  10 mg Oral Daily  . fluticasone  1 spray Each Nare Daily  . methylPREDNISolone (SOLU-MEDROL) injection  60 mg Intravenous Q6H  . mometasone-formoterol  2 puff Inhalation BID  . montelukast  10 mg Oral QHS  . piperacillin-tazobactam (ZOSYN)  IV  3.375 g Intravenous 3 times per day  . sodium chloride  3 mL Intravenous Q12H  . tiotropium  18 mcg Inhalation Daily  . vancomycin  750 mg Intravenous Q12H    Objective: Vital signs in last 24 hours: Temp:  [97.5 F (36.4 C)-98 F (36.7 C)] 97.6 F (36.4 C) (06/25 0512) Pulse Rate:  [70-91] 82 (06/25 1026) Resp:  [20-34] 20 (06/24 2112) BP: (97-110)/(42-50) 110/47 mmHg (06/25 1026) SpO2:  [93 %-100 %] 96 % (06/25 1026) FiO2 (%):  [28 %] 28 % (06/25 0803) Weight:  [58.968 kg (130 lb)] 58.968 kg (130 lb) (06/24 1456)   Lab Results  Recent Labs  05/12/15 1113 05/13/15 0601  WBC 20.2* 11.3*  HGB 11.9* 10.3*  HCT 37.0 31.6*  NA 134* 136  K 4.1 3.6  CL 95* 100*  CO2 28 29  BUN 20 19  CREATININE 0.76 0.69    Microbiology: Results for orders placed or performed during the hospital encounter of 05/12/15  Blood culture (routine x 2)     Status:  None (Preliminary result)   Collection Time: 05/12/15  1:05 PM  Result Value Ref Range Status   Specimen Description BLOOD  Final   Special Requests NONE  Final   Culture NO GROWTH < 24 HOURS  Final   Report Status PENDING  Incomplete  Blood culture (routine x 2)     Status: None (Preliminary result)   Collection Time: 05/12/15  1:05 PM  Result Value Ref Range Status   Specimen Description BLOOD  Final   Special Requests NONE  Final   Culture NO GROWTH < 24 HOURS  Final   Report Status PENDING  Incomplete  MRSA PCR Screening     Status: None   Collection Time: 05/12/15  5:36 PM  Result Value Ref Range Status   MRSA by PCR NEGATIVE NEGATIVE Final    Comment:        The GeneXpert MRSA Assay (FDA approved for NASAL specimens only), is one component of a comprehensive MRSA colonization surveillance program. It is not intended to diagnose MRSA infection nor to guide or monitor treatment for MRSA infections.     Studies/Results: Dg Chest 2 View (if Patient Has Fever And/or Copd)  05/12/2015   CLINICAL DATA:  COPD.  Progressive shortness of breath.  EXAM: CHEST - 2 VIEW  COMPARISON:  CT chest 05/08/2015  FINDINGS: The heart size is normal. Airspace opacity in retraction in the right upper lobe is stable. The chronic interstitial disease is similar to the prior study. Superimposed airspace consolidation is present in the right upper lobe. Emphysematous changes are again seen. Surgical clips are present in the right axilla.  IMPRESSION: 1. Stable chronic interstitial coarsening 2. Superimposed right upper lobe airspace disease is noted. This raises concern for pneumonia. 3. Emphysema.   Electronically Signed   By: San Morelle M.D.   On: 05/12/2015 12:53    Assessment/Plan: Patient is a 74 year old white female with history of COPD presents with shortness of breath and found to have wbc 20, RUL infiltrate. WBC down from 20 to 11, afebrile. CXR RUL infiltrate. BCX negative 1.  Acute respiratory failure: Due to acute on chronic COPD exasperation as well as pneumonia. There is a history of MRSA pneumonia therefore we will go ahead and treat patient with Zosyn and vancomycin. For her COPD exasperation place her on nebulizers, continue Spiriva and Advair substitute.  2. Hypertension continue atenolol as taking at home  3. Generalized anxiety disorder continue alprazolam and Lexapro  4. Miscellaneous use Lovenox for DVT prophylaxis  5. Nicotine addiction: 46mn spent smoking cessation done recommend she stop, she not intrested in any replacement therapy    Thank you very much for the consult. Will follow with you.  [place]ast [place]  05/13/2015, 11:58 AM

## 2015-05-14 MED ORDER — TRAMADOL HCL 50 MG PO TABS: 50.0000 mg | ORAL_TABLET | Freq: Four times a day (QID) | ORAL | Status: DC | PRN

## 2015-05-14 MED ORDER — TRAMADOL HCL 50 MG PO TABS
50.0000 mg | ORAL_TABLET | Freq: Four times a day (QID) | ORAL | Status: DC
  Administered 2015-05-14 – 2015-05-18 (×16): 50 mg via ORAL
  Filled 2015-05-14 (×16): qty 1

## 2015-05-14 NOTE — Progress Notes (Signed)
[place] PROGRESS NOTE Date of Admission:  05/12/2015     ID: DELFINA SCHREURS is a 74 y.o. female with  COPD and PNA  Active Problems:   SOB (shortness of breath)   Subjective: Less sob, still with cough - somewhat more productive. No fevers Has pain in back and hands  ROS  Eleven systems are reviewed and negative except per hpi  Medications:  Antibiotics Given (last 72 hours)    Date/Time Action Medication Dose Rate   05/12/15 2313 Given   piperacillin-tazobactam (ZOSYN) IVPB 3.375 g 3.375 g 12.5 mL/hr   05/13/15 0314 Given   vancomycin (VANCOCIN) IVPB 750 mg/150 ml premix 750 mg 150 mL/hr   05/13/15 0347 Given   piperacillin-tazobactam (ZOSYN) IVPB 3.375 g 3.375 g 12.5 mL/hr   05/13/15 1343 Given   piperacillin-tazobactam (ZOSYN) IVPB 3.375 g 3.375 g 12.5 mL/hr   05/13/15 1343 Given   vancomycin (VANCOCIN) IVPB 750 mg/150 ml premix 750 mg 150 mL/hr   05/13/15 2051 Given  [patient preference]   piperacillin-tazobactam (ZOSYN) IVPB 3.375 g 3.375 g 12.5 mL/hr   05/14/15 0120 Given   vancomycin (VANCOCIN) IVPB 750 mg/150 ml premix 750 mg 150 mL/hr   05/14/15 0549 Given   piperacillin-tazobactam (ZOSYN) IVPB 3.375 g 3.375 g 12.5 mL/hr     . albuterol  2.5 mg Nebulization TID  . atenolol  50 mg Oral Daily  . enoxaparin (LOVENOX) injection  40 mg Subcutaneous Q24H  . escitalopram  10 mg Oral Daily  . fluticasone  1 spray Each Nare Daily  . methylPREDNISolone (SOLU-MEDROL) injection  60 mg Intravenous Q6H  . mometasone-formoterol  2 puff Inhalation BID  . montelukast  10 mg Oral QHS  . piperacillin-tazobactam (ZOSYN)  IV  3.375 g Intravenous 3 times per day  . sodium chloride  3 mL Intravenous Q12H  . tiotropium  18 mcg Inhalation Daily  . vancomycin  750 mg Intravenous Q12H    Objective: Vital signs in last 24 hours: Temp:  [97.7 F (36.5 C)-98.3 F (36.8 C)] 98.3 F (36.8 C) (06/26 0450) Pulse Rate:  [59-91] 91 (06/26 0924) Resp:  [18-20] 18 (06/26  0450) BP: (120-129)/(49-68) 124/60 mmHg (06/26 0924) SpO2:  [94 %-100 %] 96 % (06/26 0924) Physical Exam  Constitutional:  Frail  HENT: [place]/AT, PERRLA, no scleral icterus Mouth/Throat: Oropharynx is clear and moist. No oropharyngeal exudate.  Cardiovascular: Normal rate, regular rhythm and normal heart sounds. Exam reveals no gallop and no friction rub.  No murmur heard.  Pulmonary/Chest: rhonchi, poor air movement Neck - supple, no nuchal rigidity Abdominal: Soft. Bowel sounds are normal.  exhibits no distension. There is no tenderness.  Lymphadenopathy: no cervical adenopathy. No axillary adenopathy Neurological: alert and oriented to person, place, and time.  Skin: Skin is warm and dry. No rash noted. No erythema.  Psychiatric: a normal mood and affect.  behavior is normal.    Lab Results  Recent Labs  05/12/15 1113 05/13/15 0601  WBC 20.2* 11.3*  HGB 11.9* 10.3*  HCT 37.0 31.6*  NA 134* 136  K 4.1 3.6  CL 95* 100*  CO2 28 29  BUN 20 19  CREATININE 0.76 0.69    Microbiology: Results for orders placed or performed during the hospital encounter of 05/12/15  Blood culture (routine x 2)     Status: None (Preliminary result)   Collection Time: 05/12/15  1:05 PM  Result Value Ref Range Status   Specimen Description BLOOD  Final   Special Requests  NONE  Final   Culture NO GROWTH 2 DAYS  Final   Report Status PENDING  Incomplete  Blood culture (routine x 2)     Status: None (Preliminary result)   Collection Time: 05/12/15  1:05 PM  Result Value Ref Range Status   Specimen Description BLOOD  Final   Special Requests NONE  Final   Culture NO GROWTH 2 DAYS  Final   Report Status PENDING  Incomplete  MRSA PCR Screening     Status: None   Collection Time: 05/12/15  5:36 PM  Result Value Ref Range Status   MRSA by PCR NEGATIVE NEGATIVE Final    Comment:        The GeneXpert MRSA Assay (FDA approved for NASAL specimens only), is one component of a comprehensive MRSA  colonization surveillance program. It is not intended to diagnose MRSA infection nor to guide or monitor treatment for MRSA infections.   Culture, expectorated sputum-assessment     Status: None (Preliminary result)   Collection Time: 05/13/15  6:07 PM  Result Value Ref Range Status   Specimen Description SPU  Final   Special Requests Normal  Final   Sputum evaluation THIS SPECIMEN IS ACCEPTABLE FOR SPUTUM CULTURE  Final   Report Status PENDING  Incomplete  Culture, respiratory (NON-Expectorated)     Status: None (Preliminary result)   Collection Time: 05/13/15  6:07 PM  Result Value Ref Range Status   Specimen Description SPU  Final   Special Requests Normal Reflexed from [id number]  Final   Gram Stain PENDING  Incomplete   Culture TOO YOUNG TO READ  Final   Report Status PENDING  Incomplete    Studies/Results: Dg Chest 2 View (if Patient Has Fever And/or Copd)  05/12/2015   CLINICAL DATA:  COPD.  Progressive shortness of breath.  EXAM: CHEST - 2 VIEW  COMPARISON:  CT chest 05/08/2015  FINDINGS: The heart size is normal. Airspace opacity in retraction in the right upper lobe is stable. The chronic interstitial disease is similar to the prior study. Superimposed airspace consolidation is present in the right upper lobe. Emphysematous changes are again seen. Surgical clips are present in the right axilla.  IMPRESSION: 1. Stable chronic interstitial coarsening 2. Superimposed right upper lobe airspace disease is noted. This raises concern for pneumonia. 3. Emphysema.   Electronically Signed   By: San Morelle M.D.   On: 05/12/2015 12:53    Assessment/Plan: Patient is a 74 year old white female with history of COPD presents with shortness of breath and found to have wbc 20, RUL infiltrate. WBC down from 20 to 11, afebrile. CXR RUL infiltrate. BCX negative. Sputum cx pending 1. Acute respiratory failure: Due to acute on chronic COPD exasperation as well as pneumonia. There is a  history of MRSA pneumonia therefore will cont Zosyn and vancomycin but nasal MRSA screen negative  For her COPD exasperation place her on nebulizers, continue Spiriva and Advair substitute. Sputum cx pending  2. Hypertension continue atenolol as taking at home  3. Generalized anxiety disorder continue alprazolam and Lexapro  4. Chronic pain - on norco 5/325 1-2  q 4 hrs currently - has been on for several years per pt but not well controlled. Is on prednisone now as well.  She is requesting to try a new medicine or increase dose for pain control. Will add tramadol for now   PPX  Lovenox for DVT prophylaxis  Dispo -   Darius Lundberg   05/14/2015, 11:37 AM

## 2015-05-14 NOTE — Plan of Care (Signed)
Problem: Phase I Progression Outcomes Goal: Confirm chest x-ray completed Outcome: Completed/Met Date Met:  05/14/15 Chest xray done on 05/02/15.

## 2015-05-14 NOTE — Progress Notes (Signed)
ANTIBIOTIC CONSULT NOTE -Follow up  Pharmacy Consult for Vancomycin  Indication: pneumonia  Allergies  Allergen Reactions  . Coreg [Carvedilol] Other (See Comments)    syncope    Patient Measurements: Height: '5\' 5"'$  (165.1 cm) Weight: 130 lb (58.968 kg) IBW/kg (Calculated) : 57   Vital Signs: Temp: 97.8 F (36.6 C) (06/26 1326) Temp Source: Oral (06/26 1326) BP: 132/48 mmHg (06/26 1326) Pulse Rate: 67 (06/26 1326) Intake/Output from previous day: 06/25 0701 - 06/26 0700 In: 780 [P.O.:480; IV Piggyback:300] Out: 1100 [Urine:1100] Intake/Output from this shift: Total I/O In: 652 [P.O.:240; I.V.:280; IV Piggyback:132] Out: -   Labs:  Recent Labs  05/12/15 1113 05/13/15 0601  WBC 20.2* 11.3*  HGB 11.9* 10.3*  PLT 210 190  CREATININE 0.76 0.69   Estimated Creatinine Clearance: 56.4 mL/min (by C-G formula based on Cr of 0.69). No results for input(s): VANCOTROUGH, VANCOPEAK, VANCORANDOM, GENTTROUGH, GENTPEAK, GENTRANDOM, TOBRATROUGH, TOBRAPEAK, TOBRARND, AMIKACINPEAK, AMIKACINTROU, AMIKACIN in the last 72 hours.   Microbiology: Recent Results (from the past 720 hour(s))  Blood culture (routine x 2)     Status: None (Preliminary result)   Collection Time: 05/12/15  1:05 PM  Result Value Ref Range Status   Specimen Description BLOOD  Final   Special Requests NONE  Final   Culture NO GROWTH 2 DAYS  Final   Report Status PENDING  Incomplete  Blood culture (routine x 2)     Status: None (Preliminary result)   Collection Time: 05/12/15  1:05 PM  Result Value Ref Range Status   Specimen Description BLOOD  Final   Special Requests NONE  Final   Culture NO GROWTH 2 DAYS  Final   Report Status PENDING  Incomplete  MRSA PCR Screening     Status: None   Collection Time: 05/12/15  5:36 PM  Result Value Ref Range Status   MRSA by PCR NEGATIVE NEGATIVE Final    Comment:        The GeneXpert MRSA Assay (FDA approved for NASAL specimens only), is one component of  a comprehensive MRSA colonization surveillance program. It is not intended to diagnose MRSA infection nor to guide or monitor treatment for MRSA infections.   Culture, expectorated sputum-assessment     Status: None (Preliminary result)   Collection Time: 05/13/15  6:07 PM  Result Value Ref Range Status   Specimen Description SPU  Final   Special Requests Normal  Final   Sputum evaluation THIS SPECIMEN IS ACCEPTABLE FOR SPUTUM CULTURE  Final   Report Status PENDING  Incomplete  Culture, respiratory (NON-Expectorated)     Status: None (Preliminary result)   Collection Time: 05/13/15  6:07 PM  Result Value Ref Range Status   Specimen Description SPU  Final   Special Requests Normal Reflexed from [id number]  Final   Gram Stain   Final    MODERATE WBC SEEN RARE GRAM POSITIVE COCCI RARE GRAM NEGATIVE DIPLOCOCCI GOOD SPECIMEN - 80-90% WBCS    Culture TOO YOUNG TO READ  Final   Report Status PENDING  Incomplete    Medical History: Past Medical History  Diagnosis Date  . COPD (chronic obstructive pulmonary disease)   . Hypertension   . Breast CA   . Colon cancer   . Asthma   . CHF (congestive heart failure)   . Hyperlipemia   . B12 deficiency   . Osteoporosis   . Right adrenal mass   . Allergic rhinitis   . Breast cancer   . Colon cancer   .  MRSA pneumonia     Medications:  Scheduled:  . albuterol  2.5 mg Nebulization TID  . atenolol  50 mg Oral Daily  . enoxaparin (LOVENOX) injection  40 mg Subcutaneous Q24H  . escitalopram  10 mg Oral Daily  . fluticasone  1 spray Each Nare Daily  . methylPREDNISolone (SOLU-MEDROL) injection  60 mg Intravenous Q6H  . mometasone-formoterol  2 puff Inhalation BID  . montelukast  10 mg Oral QHS  . piperacillin-tazobactam (ZOSYN)  IV  3.375 g Intravenous 3 times per day  . sodium chloride  3 mL Intravenous Q12H  . tiotropium  18 mcg Inhalation Daily  . traMADol  50 mg Oral 4 times per day  . vancomycin  750 mg Intravenous Q12H    Infusions:   Assessment: 74 yo female with history of MRSA PNA admitted with COPD exacerbation/PNA. Patient also on Zosyn EI 3.375g IV Q8hr and vancomycin '750mg'$  IV Q12hr for goal trough of 15-20. Patient also ordered methylprednisolone '60mg'$  IV q6hr.     Goal of Therapy:  Vancomycin trough level 15-20 mcg/ml  Plan:   Will continue patient on vancomycin '750mg'$  IV Q12hr for goal tough of 15-20. Will obtain trough prior to am dose on 6/26.  Please consider tapering steroids as clinically indicated.    6/26: RN hung dose before trough drawn. Will reschedule trough prior to next dose on 6/27 at 0130.    Chinita Greenland PharmD Clinical Pharmacist 05/14/2015

## 2015-05-14 NOTE — Plan of Care (Signed)
Problem: Discharge Progression Outcomes Goal: Discharge plan in place and appropriate Outcome: Progressing Progressing to goals: 1. Up more in chair/room and tolerated well with 02 on @ 2l/[place]. 2. Chronic back pain with tramadol started on regular schedule and PRN use of hydrocodone/acetaminophen. 3. Continued Zosyn and Vancomycin IV with pt reporting improvement with more loose effective coughing. 4. VSS, afebrile.  WBC trending down. No respiratory issues.

## 2015-05-14 NOTE — Plan of Care (Signed)
Problem: Discharge Progression Outcomes Goal: Discharge plan in place and appropriate Outcome: Progressing Patient is alert and oriented, c/o back pain. Norco given x1 with no improvement, refuses further medication. Pt states she will speak with the rounding doctors in the morning. Xanax given with improvement of anxiety. Remains independent in the room, VSS.

## 2015-05-15 DIAGNOSIS — J189 Pneumonia, unspecified organism: Secondary | ICD-10-CM | POA: Diagnosis present

## 2015-05-15 DIAGNOSIS — R768 Other specified abnormal immunological findings in serum: Secondary | ICD-10-CM | POA: Diagnosis present

## 2015-05-15 LAB — CBC
HCT: 32.2 % — ABNORMAL LOW (ref 35.0–47.0)
Hemoglobin: 10.3 g/dL — ABNORMAL LOW (ref 12.0–16.0)
MCH: 27.1 pg (ref 26.0–34.0)
MCHC: 32.1 g/dL (ref 32.0–36.0)
MCV: 84.3 fL (ref 80.0–100.0)
Platelets: 243 K/uL (ref 150–440)
RBC: 3.81 MIL/uL (ref 3.80–5.20)
RDW: 14.6 % — ABNORMAL HIGH (ref 11.5–14.5)
WBC: 11.1 K/uL — ABNORMAL HIGH (ref 3.6–11.0)

## 2015-05-15 LAB — URINALYSIS COMPLETE WITH MICROSCOPIC (ARMC ONLY)
Bacteria, UA: NONE SEEN
Bilirubin Urine: NEGATIVE
Glucose, UA: NEGATIVE mg/dL
Hgb urine dipstick: NEGATIVE
Ketones, ur: NEGATIVE mg/dL
Nitrite: NEGATIVE
Protein, ur: NEGATIVE mg/dL
Specific Gravity, Urine: 1.018 (ref 1.005–1.030)
pH: 6 (ref 5.0–8.0)

## 2015-05-15 LAB — EXPECTORATED SPUTUM ASSESSMENT W GRAM STAIN, RFLX TO RESP C

## 2015-05-15 LAB — CREATININE, SERUM
CREATININE: 0.78 mg/dL (ref 0.44–1.00)
GFR calc Af Amer: >60 mL/min (ref >60)
GFR calc non Af Amer: >60 mL/min (ref >60)

## 2015-05-15 LAB — EXPECTORATED SPUTUM ASSESSMENT W REFEX TO RESP CULTURE: SPECIAL REQUESTS: NORMAL

## 2015-05-15 LAB — VANCOMYCIN, TROUGH: Vancomycin Tr: 12 ug/mL (ref 10–20)

## 2015-05-15 MED ORDER — HYDRALAZINE HCL 25 MG PO TABS
25.0000 mg | ORAL_TABLET | Freq: Two times a day (BID) | ORAL | Status: DC
  Administered 2015-05-15 (×2): 25 mg via ORAL
  Filled 2015-05-15 (×2): qty 1

## 2015-05-15 MED ORDER — VANCOMYCIN HCL IN DEXTROSE 1-5 GM/200ML-% IV SOLN
1000.0000 mg | Freq: Two times a day (BID) | INTRAVENOUS | Status: DC
  Administered 2015-05-15 – 2015-05-17 (×4): 1000 mg via INTRAVENOUS
  Filled 2015-05-15 (×5): qty 200

## 2015-05-15 MED ORDER — METHYLPREDNISOLONE SODIUM SUCC 125 MG IJ SOLR
60.0000 mg | INTRAMUSCULAR | Status: DC
  Administered 2015-05-16: 60 mg via INTRAVENOUS
  Filled 2015-05-15: qty 2

## 2015-05-15 NOTE — Progress Notes (Addendum)
Date: 05/15/2015,   MRN# [id number] Robin Hudson [birth year] Code Status:     Code Status Orders        Start     Ordered   05/12/15 1510  Full code   Continuous     05/12/15 1509     Hosp day:'@LENGTHOFSTAYDAYS'$ @ Referring MD: '@ATDPROV'$ @           AdmissionWeight: 129 lb (58.514 kg)                 CurrentWeight: 130 lb (58.968 kg) Robin Hudson is a 74 y.o. old female seen in consultation for  at the request of Dr. Kerrin Mo.   CC: abnormal chest xray  HPI: This is a 74 yr old lady, asked to see regarding predominant rul infiltrate with known prior hx of almost complete opacification of right hemothorax, positive ana and ra, Status prior xrt to the lung see details on the chart. She denies alopecia, facial rash, bruising.    PMHX:   Past Medical History  Diagnosis Date  . COPD (chronic obstructive pulmonary disease)   . Hypertension   . Breast CA   . Colon cancer   . Asthma   . CHF (congestive heart failure)   . Hyperlipemia   . B12 deficiency   . Osteoporosis   . Right adrenal mass   . Allergic rhinitis   . Breast cancer   . Colon cancer   . MRSA pneumonia    Surgical Hx:  Past Surgical History  Procedure Laterality Date  . Abdominal hysterectomy    . Appendectomy    . Colectomy      partial  . Image guided sinus surgery     Family Hx:  Family History  Problem Relation Age of Onset  . Heart attack Mother    Social Hx:   History  Substance Use Topics  . Smoking status: Current Every Day Smoker -- 0.25 packs/day    Types: Cigarettes  . Smokeless tobacco: Never Used  . Alcohol Use: No   Medication:    Home Medication:  No current outpatient prescriptions on file.  Current Medication: '@CURMEDTAB'$ @   Allergies:  Coreg  Review of Systems: Gen:  Denies  fever, sweats, chills HEENT: Denies blurred vision, double vision, ear pain, eye pain, hearing loss, nose bleeds, sore throat Cvc:  No dizziness, chest pain or heaviness Resp:  Shortness of  breath is improved, no pleurisy, hemoptysis  Gi: Denies swallowing difficulty, stomach pain, nausea or vomiting, diarrhea, constipation, bowel incontinence Gu:  Denies bladder incontinence, burning urine Ext:   No Joint pain, stiffness or swelling Skin: No skin rash, easy bruising or bleeding or hives Endoc:  No polyuria, polydipsia , polyphagia or weight change Psych: No depression, insomnia or hallucinations  Other:  All other systems negative  Physical Examination:   VS: BP 138/55 mmHg  Pulse 55  Temp(Src) 98.1 F (36.7 C) (Oral)  Resp 20  Ht '5\' 5"'$  (1.651 m)  Wt 130 lb (58.968 kg)  BMI 21.63 kg/m2  SpO2 97%  General Appearance: No distress  Neuro/psych without focal findings, mental status, speech normal, alert and oriented, cranial nerves 2-12 intact, reflexes normal and symmetric, sensation grossly normal  HEENT: PERRLA, EOM intact, no ptosis, no other lesions noticed Pulmonary:.No wheezing, No rales  rul crackles   Cardiovascular:  Normal S1,S2.  No m/r/g.  Abdominal aorta pulsation normal.    Abdomen:Benign, Soft, non-tender, No masses, hepatosplenomegaly, No lymphadenopathy Endoc: No evident thyromegaly, no signs  of acromegaly or Cushing features Skin:   warm, no rashes, no ecchymosis  Extremities: normal, no cyanosis, clubbing, no edema, warm with normal capillary refill. Other findings:   Labs results:   Recent Labs     05/13/15  0601  05/15/15  0117  HGB  10.3*  10.3*  HCT  31.6*  32.2*  MCV  84.2  84.3  WBC  11.3*  11.1*  BUN  19   --   CREATININE  0.69  0.78  GLUCOSE  169*   --   CALCIUM  8.9   --   ,  IMPRESSION: 1. Areas of parenchymal consolidation in the right upper lobe and both lower lobes are grossly unchanged from 04/07/2015 with exception of a nodular lesion in the right lower lobe which is slightly less prominent. Findings favor an infectious or inflammatory etiology. Additional followup could be performed in 3 months to ensure continued  resolution, as clinically indicated. 2. Three-vessel coronary artery calcification. 3. Bilobed nodular lesion in the right adrenal gland is unchanged from 02/10/2010, favoring an adenoma.    Assessment and Plan: Overall her cxr findings is c/w more so of slow resolving pneumonia. In April her right lung was almost opacified. This admission cxr showed predominant right upper lobe infiltrate. In 2/16 her cxr was clear. Her chest ct 5/16 and 6/16 were unchanged. Doubt radiation pneumonitis is playing a major role here. The issue of connective tissue disease is part of the differential. Doubt lupus/ra changes, usually bilateral but cannot rule out. No central lesion or airway obstruction noted on ct to suggest recurrent post obstruction pneumonia.   Plan:  CTD serologies per rheumatology. emperic antibiotics as is, Repeat chest ct in 3 months, hold on bronch (patient wants to wait), if ctd is not diagnosed, wean steroids over 2 weeks, reassess tomorrow.     I have personally obtained a history, examined the patient, evaluated laboratory and imaging results, formulated the assessment and plan and placed orders.  The Patient requires high complexity decision making for assessment and support, frequent evaluation and titration of therapies, application of advanced monitoring technologies and extensive interpretation of multiple databases.   Herbon Fleming,M.D. Pulmonary & Critical care Medicine [facility]

## 2015-05-15 NOTE — Care Management (Signed)
Important Message  Patient Details  Name: Robin Hudson MRN: [id number] Date of Birth: Oct 15, 1941   Medicare Important Message Given:  Yes-second notification given    Darius Bump Allmond 05/15/2015, 2:07 PM

## 2015-05-15 NOTE — Plan of Care (Signed)
Problem: Discharge Progression Outcomes Goal: Discharge plan in place and appropriate Outcome: Progressing Plan of care progress to goal:  Patient remains on 2L-O2, sat 97%. Up to bathroom with standby assist.  Scheduled Tramadol continues for chronic back pain. IV antibiotics given as scheduled. Afebrile this shift. BP elevated - order placed per Dr. Caryl Comes for Hydralazine bid (patients home medication). Husband at bedside.

## 2015-05-15 NOTE — Plan of Care (Signed)
Problem: Discharge Progression Outcomes Goal: Discharge plan in place and appropriate Outcome: Progressing Plan of care progress to goal: Pt is independent in care Ultram relieving pain

## 2015-05-15 NOTE — Consult Note (Addendum)
Reason for Consult: abnormal labs  Referring Physician: Dr. Kerrin Mo  Robin Hudson   HPI: 74 year old white female. History of lumbar disc disease. History of colon cancer breast cancer. Had hospitalization for fatigue and pneumonia in April. COPD. History of cigarette use. Right upper lobe nodule. MRSA. antibiotics with improvement. History of hypertension. Recently on hydralazine. Lately she's been having more joint pain. Feet and hands. Says she has no morning stiffness. They just ache. No numbness. She feels like the third and fourth fingers will trigger bilaterally. When she uses the computer. Neck and neck bothered her more. Was seen last week in the office. Improved with tramadol. ANA 1-320 anticentromere pattern. Rheumatoid factor 27. Sedimentation rate 45. CRP 119. After that she developed respiratory purulent sputum. . Came to the emergency room was readmitted for pneumonia. Received steroids and antibiotics with improvement. repeat chest CT shows right upper lobe infiltrate. Prior bronchiectasis. Question small nodules. No significant diffuse interstitial lung disease.  no history of dysphagia. No stricture. Recent echocardiogram showed RVSP 32. Mild anemia. Urinalysis and creatinine unremarkable. Sputum growing cocci Hands and feet not hurting today No history of raynauds  PMH: Hyperlipidemia. Hypertension. Breast cancer. Colon cancer. Cardiomyopathy. COPD.  SURGICAL HISTORY: Colectomy. Hysterectomy. Appendectomy.  Family History: Negative for connective tissue disease  Social History: Still smokes occasionally. No significant alcohol  Allergies:  Allergies  Allergen Reactions  . Coreg [Carvedilol] Other (See Comments)    syncope    Medications:  Scheduled: . albuterol  2.5 mg Nebulization TID  . atenolol  50 mg Oral Daily  . enoxaparin (LOVENOX) injection  40 mg Subcutaneous Q24H  . escitalopram  10 mg Oral Daily  . fluticasone  1 spray Each Nare Daily  .  hydrALAZINE  25 mg Oral BID  . [START ON 05/16/2015] methylPREDNISolone (SOLU-MEDROL) injection  60 mg Intravenous Q24H  . mometasone-formoterol  2 puff Inhalation BID  . montelukast  10 mg Oral QHS  . piperacillin-tazobactam (ZOSYN)  IV  3.375 g Intravenous 3 times per day  . sodium chloride  3 mL Intravenous Q12H  . tiotropium  18 mcg Inhalation Daily  . traMADol  50 mg Oral 4 times per day  . vancomycin  1,000 mg Intravenous Q12H   Continuous:  MAU:[id number] chloride, acetaminophen **OR** acetaminophen, ALPRAZolam, HYDROcodone-acetaminophen, ondansetron **OR** ondansetron (ZOFRAN) IV, sodium chloride Anti-infectives    Start     Dose/Rate Route Frequency Ordered Stop   05/15/15 1330  vancomycin (VANCOCIN) IVPB 1000 mg/200 mL premix    Comments:  Patient started Vancomycin on 6/24.   1,000 mg 200 mL/hr over 60 Minutes Intravenous Every 12 hours 05/15/15 0322     05/13/15 1400  vancomycin (VANCOCIN) IVPB 750 mg/150 ml premix  Status:  Discontinued    Comments:  Patient started Vancomycin on 6/24.   750 mg 150 mL/hr over 60 Minutes Intravenous Every 12 hours 05/13/15 0436 05/15/15 0322   05/12/15 1800  vancomycin (VANCOCIN) IVPB 750 mg/150 ml premix  Status:  Discontinued     750 mg 150 mL/hr over 60 Minutes Intravenous Every 12 hours 05/12/15 1624 05/13/15 0437   05/12/15 1400  piperacillin-tazobactam (ZOSYN) IVPB 3.375 g     3.375 g 12.5 mL/hr over 240 Minutes Intravenous 3 times per day 05/12/15 1357     05/12/15 1306  piperacillin-tazobactam (ZOSYN) 3.375 GM/50ML IVPB    Comments:  Clouden, Lauryn: cabinet override      05/12/15 1306 05/12/15 1311   05/12/15 1305  vancomycin (VANCOCIN) 1 GM/200ML  IVPB    Comments:  Clouden, Lauryn: cabinet override      05/12/15 1305 05/12/15 1311   05/12/15 1245  vancomycin (VANCOCIN) IVPB 1000 mg/200 mL premix     1,000 mg 200 mL/hr over 60 Minutes Intravenous  Once 05/12/15 1238 05/12/15 1455   05/12/15 1245  piperacillin-tazobactam (ZOSYN)  IVPB 3.375 g     3.375 g 12.5 mL/hr over 240 Minutes Intravenous  Once 05/12/15 1238 05/12/15 1350          ROS: No photosensitivity. No alopecia. No oral ulcers. No dysphagia. No significant chest pain. No numbness or tingling. Hands do not swell. No morning stiffness   PHYSICAL EXAM: Blood pressure 138/55, pulse 55, temperature 98.1 F (36.7 C), temperature source Oral, resp. rate 20, height '5\' 5"'$  (1.651 m), weight 58.968 kg (130 lb), SpO2 97 %. Positive female. No acute distress. Oxygen in place. Sclerae are clear. Good carotid upstroke. No thyromegaly. No significant rales or rhonchi or interstitial sounds. Raspy cough. No significant murmur. No loud P2. No visceromegaly. No significant edema. Good range of motion of her neck and shoulders. Hands mild hypertrophic changes distally. No significant synovitis wrists MCPs PIPs. No trigger nodules. Negative Tinel's. Hips move well. Knees without effusions. MTPs nontender. Good range of motion of her ankles. Good distal pulses. 5/5 muscle strength  Assessment: Low positive rheumatoid factor without obvious rheumatoid arthritis on exam, however the patient is on steroids Positive ANA.ACA pattern. No evidence of sclerodactyly or telangiectasias. No history of Raynaud's. No interstitial lung disease. Low suspicion for scleroderma or crest Positive ANA. May be associated with hydralazine use which she has intermittently used. Can give a drug-induced positive ANA Right upper lobe infiltrate. History of emphysema ,nodule,question bronchiectasis. Could patient have MAI? Lumbar disc disease and chronic pain  Recommendations: Anti-CCP antibody. ENA and anti-DNA either in hospital or after pneumonia clears Consider discontinuing hydralazine Consider AFB stain and culture for MAI Happy to reassess joints after she is over this acute illness  Emmaline Kluver 05/15/2015, 7:49 PM

## 2015-05-15 NOTE — Care Management (Signed)
Admitted to [facility] with the diagnosis of shortness of breathe. Lives with husband, Gwenlyn Perking, [phone]). Home Health last April. Doesn't remember name of agency. No skilled facility. Home oxygen thru [facility] for several years, Sees Dr. Ramonita Lab. Seen last week. Uses no devices for ambulation, still drives. Takes care of all instrumental and [place] activities of daily living. Husband will transport. Shelbie Ammons RN MSN Care Management [phone]

## 2015-05-15 NOTE — Progress Notes (Signed)
SUBJECTIVE:  Admitted with pneumonia; h/o treatment for pneumonia 4/16, with MRSA from sputum culture. Has had persistent infiltrate on CT/CXR.  Recently with diffuse myalgias; labs in office returned Friday with elevated ANA (1:320) in centromere pattern as well as elevated rheumatoid factor.  Feels much better; pain improved as well, with pt on high dose steroids for acute COPD exacerbation.  Still with some cough, improved  ______________________________________________________________________  ROS: Please see HPI; remainder of complete 10 point ROS is negative   Past Medical History  Diagnosis Date  . COPD (chronic obstructive pulmonary disease)   . Hypertension   . Breast CA   . Colon cancer   . Asthma   . CHF (congestive heart failure)   . Hyperlipemia   . B12 deficiency   . Osteoporosis   . Right adrenal mass   . Allergic rhinitis   . Breast cancer   . Colon cancer   . MRSA pneumonia     Past Surgical History  Procedure Laterality Date  . Abdominal hysterectomy    . Appendectomy    . Colectomy      partial  . Image guided sinus surgery       Current facility-administered medications:  .  0.9 %  sodium chloride infusion, 250 mL, Intravenous, PRN, Dustin Flock, MD .  acetaminophen (TYLENOL) tablet 650 mg, 650 mg, Oral, Q6H PRN **OR** acetaminophen (TYLENOL) suppository 650 mg, 650 mg, Rectal, Q6H PRN, Dustin Flock, MD .  albuterol (PROVENTIL) (2.5 MG/3ML) 0.083% nebulizer solution 2.5 mg, 2.5 mg, Nebulization, TID, Fritzi Mandes, MD, 2.5 mg at 05/14/15 1940 .  ALPRAZolam Duanne Moron) tablet 0.5 mg, 0.5 mg, Oral, TID PRN, Dustin Flock, MD, 0.5 mg at 05/14/15 2039 .  atenolol (TENORMIN) tablet 50 mg, 50 mg, Oral, Daily, Dustin Flock, MD, 50 mg at 05/14/15 0921 .  enoxaparin (LOVENOX) injection 40 mg, 40 mg, Subcutaneous, Q24H, Dustin Flock, MD, 40 mg at 05/14/15 1540 .  escitalopram (LEXAPRO) tablet 10 mg, 10 mg, Oral, Daily, Dustin Flock, MD, 10 mg at 05/14/15  0921 .  fluticasone (FLONASE) 50 MCG/ACT nasal spray 1 spray, 1 spray, Each Nare, Daily, Dustin Flock, MD, 1 spray at 05/14/15 0920 .  HYDROcodone-acetaminophen (NORCO/VICODIN) 5-325 MG per tablet 1-2 tablet, 1-2 tablet, Oral, Q4H PRN, Dustin Flock, MD, 2 tablet at 05/14/15 1023 .  [START ON 05/16/2015] methylPREDNISolone sodium succinate (SOLU-MEDROL) 125 mg/2 mL injection 60 mg, 60 mg, Intravenous, Q24H, Tama High III, MD .  mometasone-formoterol [facility]) 100-5 MCG/ACT inhaler 2 puff, 2 puff, Inhalation, BID, Dustin Flock, MD, 2 puff at 05/14/15 2033 .  montelukast (SINGULAIR) tablet 10 mg, 10 mg, Oral, QHS, Dustin Flock, MD, 10 mg at 05/14/15 2033 .  ondansetron (ZOFRAN) tablet 4 mg, 4 mg, Oral, Q6H PRN **OR** ondansetron (ZOFRAN) injection 4 mg, 4 mg, Intravenous, Q6H PRN, Dustin Flock, MD .  piperacillin-tazobactam (ZOSYN) IVPB 3.375 g, 3.375 g, Intravenous, 3 times per day, Dustin Flock, MD, 3.375 g at 05/15/15 0509 .  sodium chloride 0.9 % injection 3 mL, 3 mL, Intravenous, Q12H, Dustin Flock, MD, 3 mL at 05/14/15 2034 .  sodium chloride 0.9 % injection 3 mL, 3 mL, Intravenous, PRN, Dustin Flock, MD, 3 mL at 05/15/15 0509 .  tiotropium (SPIRIVA) inhalation capsule 18 mcg, 18 mcg, Inhalation, Daily, Dustin Flock, MD, 18 mcg at 05/14/15 0921 .  traMADol (ULTRAM) tablet 50 mg, 50 mg, Oral, 4 times per day, Adrian Prows, MD, 50 mg at 05/15/15 0509 .  vancomycin (VANCOCIN) IVPB 1000 mg/200  mL premix, 1,000 mg, Intravenous, Q12H, Dustin Flock, MD  PHYSICAL EXAM:  BP 149/59 mmHg  Pulse 52  Temp(Src) 97.7 F (36.5 C) (Oral)  Resp 20  Ht '5\' 5"'$  (1.651 m)  Wt 58.968 kg (130 lb)  BMI 21.63 kg/m2  SpO2 99%  General: elderly  female, in NAD HEENT: PERRL; OP moist without lesions. Neck: supple, trachea midline, no thyromegaly Chest: normal to palpation Lungs: decreased airflow with coarse breath sounds in LLD; no wheeze or retractions Cardiovascular: RRR,  no gallop;  distal pulses 2+ Abdomen: soft, nontender, nondistended, positive bowel sounds Extremities: no clubbing, cyanosis, edema.  No synovitis Neuro: alert and oriented, moves all extremities Derm: no significant rashes or nodules; good skin turgor Lymph: no cervical or supraclavicular lymphadenopathy  Labs and imaging studies were reviewed  ASSESSMENT/PLAN:   1. Pneumonia- cont abx; cx pending.  Contact isolation due to MRSA history 2. Elevated ANA/RF- will ask pulmonary and rheumatology to see.  U/a pending to evaluate for sediment.  Anti-centromere pattern suggests possibility of scleroderma; set up barium swallow 3. Acute COPD exacerbation- improved; reduce steroids; watch for worsening of myalgias 4. HTN- following

## 2015-05-15 NOTE — Progress Notes (Signed)
ANTIBIOTIC CONSULT NOTE -Follow up  Pharmacy Consult for Vancomycin  Indication: pneumonia  Allergies  Allergen Reactions  . Coreg [Carvedilol] Other (See Comments)    syncope    Patient Measurements: Height: '5\' 5"'$  (165.1 cm) Weight: 130 lb (58.968 kg) IBW/kg (Calculated) : 57   Vital Signs: Temp: 98.3 F (36.8 C) (06/26 2107) Temp Source: Oral (06/26 2107) BP: 146/57 mmHg (06/26 2107) Pulse Rate: 66 (06/26 2107) Intake/Output from previous day: 06/26 0701 - 06/27 0700 In: 851 [P.O.:240; I.V.:280; IV Piggyback:331] Out: -  Intake/Output from this shift:    Labs:  Recent Labs  05/12/15 1113 05/13/15 0601 05/15/15 0117  WBC 20.2* 11.3* 11.1*  HGB 11.9* 10.3* 10.3*  PLT 210 190 243  CREATININE 0.76 0.69 0.78   Estimated Creatinine Clearance: 56.4 mL/min (by C-G formula based on Cr of 0.78).  Recent Labs  05/15/15 0117  [facility] 12     Microbiology: Recent Results (from the past 720 hour(s))  Blood culture (routine x 2)     Status: None (Preliminary result)   Collection Time: 05/12/15  1:05 PM  Result Value Ref Range Status   Specimen Description BLOOD  Final   Special Requests NONE  Final   Culture NO GROWTH 2 DAYS  Final   Report Status PENDING  Incomplete  Blood culture (routine x 2)     Status: None (Preliminary result)   Collection Time: 05/12/15  1:05 PM  Result Value Ref Range Status   Specimen Description BLOOD  Final   Special Requests NONE  Final   Culture NO GROWTH 2 DAYS  Final   Report Status PENDING  Incomplete  MRSA PCR Screening     Status: None   Collection Time: 05/12/15  5:36 PM  Result Value Ref Range Status   MRSA by PCR NEGATIVE NEGATIVE Final    Comment:        The GeneXpert MRSA Assay (FDA approved for NASAL specimens only), is one component of a comprehensive MRSA colonization surveillance program. It is not intended to diagnose MRSA infection nor to guide or monitor treatment for MRSA infections.   Culture,  expectorated sputum-assessment     Status: None (Preliminary result)   Collection Time: 05/13/15  6:07 PM  Result Value Ref Range Status   Specimen Description SPU  Final   Special Requests Normal  Final   Sputum evaluation THIS SPECIMEN IS ACCEPTABLE FOR SPUTUM CULTURE  Final   Report Status PENDING  Incomplete  Culture, respiratory (NON-Expectorated)     Status: None (Preliminary result)   Collection Time: 05/13/15  6:07 PM  Result Value Ref Range Status   Specimen Description SPU  Final   Special Requests Normal Reflexed from [id number]  Final   Gram Stain   Final    MODERATE WBC SEEN RARE GRAM POSITIVE COCCI RARE GRAM NEGATIVE DIPLOCOCCI GOOD SPECIMEN - 80-90% WBCS    Culture TOO YOUNG TO READ  Final   Report Status PENDING  Incomplete    Medical History: Past Medical History  Diagnosis Date  . COPD (chronic obstructive pulmonary disease)   . Hypertension   . Breast CA   . Colon cancer   . Asthma   . CHF (congestive heart failure)   . Hyperlipemia   . B12 deficiency   . Osteoporosis   . Right adrenal mass   . Allergic rhinitis   . Breast cancer   . Colon cancer   . MRSA pneumonia     Medications:  Scheduled:  .  albuterol  2.5 mg Nebulization TID  . atenolol  50 mg Oral Daily  . enoxaparin (LOVENOX) injection  40 mg Subcutaneous Q24H  . escitalopram  10 mg Oral Daily  . fluticasone  1 spray Each Nare Daily  . methylPREDNISolone (SOLU-MEDROL) injection  60 mg Intravenous Q6H  . mometasone-formoterol  2 puff Inhalation BID  . montelukast  10 mg Oral QHS  . piperacillin-tazobactam (ZOSYN)  IV  3.375 g Intravenous 3 times per day  . sodium chloride  3 mL Intravenous Q12H  . tiotropium  18 mcg Inhalation Daily  . traMADol  50 mg Oral 4 times per day  . vancomycin  1,000 mg Intravenous Q12H   Infusions:   Assessment: 74 yo female with history of MRSA PNA admitted with COPD exacerbation/PNA. Patient also on Zosyn EI 3.375g IV Q8hr and vancomycin '750mg'$  IV Q12hr for  goal trough of 15-20. Patient also ordered methylprednisolone '60mg'$  IV q6hr.     Goal of Therapy:  Vancomycin trough level 15-20 mcg/ml  Plan:   Will continue patient on vancomycin '750mg'$  IV Q12hr for goal tough of 15-20. Will obtain trough prior to am dose on 6/26.  Please consider tapering steroids as clinically indicated.    6/26: RN hung dose before trough drawn. Will reschedule trough prior to next dose on 6/27 at 0130.    6/27 at 01:17 Trough = 12 mcg/ml.  Increased dose to 1g IV Q12H.  Will order next trough level for 6/29 at 01:00.  Olivia Canter, [facility] Clinical Pharmacist 05/15/2015

## 2015-05-16 LAB — CULTURE, RESPIRATORY W GRAM STAIN
Culture: NORMAL
Special Requests: NORMAL

## 2015-05-16 LAB — BASIC METABOLIC PANEL
ANION GAP: 9 (ref 5–15)
BUN: 25 mg/dL — ABNORMAL HIGH (ref 6–20)
CHLORIDE: 94 mmol/L — AB (ref 101–111)
CO2: 34 mmol/L — ABNORMAL HIGH (ref 22–32)
CREATININE: 0.76 mg/dL (ref 0.44–1.00)
Calcium: 9.3 mg/dL (ref 8.9–10.3)
GFR calc non Af Amer: >60 mL/min (ref >60)
Glucose, Bld: 113 mg/dL — ABNORMAL HIGH (ref 65–99)
Potassium: 3.7 mmol/L (ref 3.5–5.1)
Sodium: 137 mmol/L (ref 135–145)

## 2015-05-16 LAB — CBC
HCT: 35.9 % (ref 35.0–47.0)
HEMOGLOBIN: 11.6 g/dL — AB (ref 12.0–16.0)
MCH: 27.2 pg (ref 26.0–34.0)
MCHC: 32.4 g/dL (ref 32.0–36.0)
MCV: 84.2 fL (ref 80.0–100.0)
Platelets: 273 10*3/uL (ref 150–440)
RBC: 4.26 MIL/uL (ref 3.80–5.20)
RDW: 14.9 % — ABNORMAL HIGH (ref 11.5–14.5)
WBC: 9.6 10*3/uL (ref 3.6–11.0)

## 2015-05-16 LAB — CULTURE, RESPIRATORY

## 2015-05-16 MED ORDER — PREDNISONE 50 MG PO TABS
50.0000 mg | ORAL_TABLET | Freq: Every day | ORAL | Status: DC
  Administered 2015-05-16 – 2015-05-18 (×3): 50 mg via ORAL
  Filled 2015-05-16 (×3): qty 1

## 2015-05-16 MED ORDER — ALBUTEROL SULFATE (2.5 MG/3ML) 0.083% IN NEBU: 2.5000 mg | INHALATION_SOLUTION | Freq: Four times a day (QID) | RESPIRATORY_TRACT | Status: DC | PRN

## 2015-05-16 MED ORDER — LOSARTAN POTASSIUM 50 MG PO TABS
50.0000 mg | ORAL_TABLET | Freq: Every day | ORAL | Status: DC
  Administered 2015-05-16 – 2015-05-17 (×2): 50 mg via ORAL
  Filled 2015-05-16 (×2): qty 1

## 2015-05-16 NOTE — Progress Notes (Signed)
SUBJECTIVE:  Admitted with pneumonia; h/o treatment for pneumonia 4/16, with MRSA from sputum culture. Has had persistent infiltrate on CT/CXR.  Recently with diffuse myalgias; labs in office returned Friday with elevated ANA (1:320) in centromere pattern as well as elevated rheumatoid factor.  No fevers, chills; cough improved.  Steroids reduced, and pt cotninues to do well.  No new c/o ______________________________________________________________________  ROS: Please see HPI; remainder of complete 10 point ROS is negative   Past Medical History  Diagnosis Date  . COPD (chronic obstructive pulmonary disease)   . Hypertension   . Breast CA   . Colon cancer   . Asthma   . CHF (congestive heart failure)   . Hyperlipemia   . B12 deficiency   . Osteoporosis   . Right adrenal mass   . Allergic rhinitis   . Breast cancer   . Colon cancer   . MRSA pneumonia     Past Surgical History  Procedure Laterality Date  . Abdominal hysterectomy    . Appendectomy    . Colectomy      partial  . Image guided sinus surgery       Current facility-administered medications:  .  0.9 %  sodium chloride infusion, 250 mL, Intravenous, PRN, Dustin Flock, MD .  acetaminophen (TYLENOL) tablet 650 mg, 650 mg, Oral, Q6H PRN **OR** acetaminophen (TYLENOL) suppository 650 mg, 650 mg, Rectal, Q6H PRN, Dustin Flock, MD .  albuterol (PROVENTIL) (2.5 MG/3ML) 0.083% nebulizer solution 2.5 mg, 2.5 mg, Nebulization, TID, Fritzi Mandes, MD, 2.5 mg at 05/15/15 1952 .  ALPRAZolam (XANAX) tablet 0.5 mg, 0.5 mg, Oral, TID PRN, Dustin Flock, MD, 0.5 mg at 05/15/15 1351 .  atenolol (TENORMIN) tablet 50 mg, 50 mg, Oral, Daily, Dustin Flock, MD, 50 mg at 05/15/15 0758 .  enoxaparin (LOVENOX) injection 40 mg, 40 mg, Subcutaneous, Q24H, Dustin Flock, MD, 40 mg at 05/15/15 1547 .  escitalopram (LEXAPRO) tablet 10 mg, 10 mg, Oral, Daily, Dustin Flock, MD, 10 mg at 05/15/15 0758 .  fluticasone (FLONASE) 50 MCG/ACT  nasal spray 1 spray, 1 spray, Each Nare, Daily, Dustin Flock, MD, 1 spray at 05/15/15 0759 .  HYDROcodone-acetaminophen (NORCO/VICODIN) 5-325 MG per tablet 1-2 tablet, 1-2 tablet, Oral, Q4H PRN, Dustin Flock, MD, 2 tablet at 05/14/15 1023 .  losartan (COZAAR) tablet 50 mg, 50 mg, Oral, Daily, Tama High III, MD .  mometasone-formoterol [facility]) 100-5 MCG/ACT inhaler 2 puff, 2 puff, Inhalation, BID, Dustin Flock, MD, 2 puff at 05/15/15 2204 .  montelukast (SINGULAIR) tablet 10 mg, 10 mg, Oral, QHS, Dustin Flock, MD, 10 mg at 05/15/15 2204 .  ondansetron (ZOFRAN) tablet 4 mg, 4 mg, Oral, Q6H PRN **OR** ondansetron (ZOFRAN) injection 4 mg, 4 mg, Intravenous, Q6H PRN, Dustin Flock, MD .  piperacillin-tazobactam (ZOSYN) IVPB 3.375 g, 3.375 g, Intravenous, 3 times per day, Dustin Flock, MD, 3.375 g at 05/16/15 0522 .  predniSONE (DELTASONE) tablet 50 mg, 50 mg, Oral, Q breakfast, Tama High III, MD .  sodium chloride 0.9 % injection 3 mL, 3 mL, Intravenous, Q12H, Dustin Flock, MD, 3 mL at 05/15/15 2211 .  sodium chloride 0.9 % injection 3 mL, 3 mL, Intravenous, PRN, Dustin Flock, MD, 3 mL at 05/15/15 0509 .  tiotropium (SPIRIVA) inhalation capsule 18 mcg, 18 mcg, Inhalation, Daily, Dustin Flock, MD, 18 mcg at 05/15/15 0759 .  traMADol (ULTRAM) tablet 50 mg, 50 mg, Oral, 4 times per day, Adrian Prows, MD, 50 mg at 05/16/15 0522 .  vancomycin (VANCOCIN)  IVPB 1000 mg/200 mL premix, 1,000 mg, Intravenous, Q12H, Dustin Flock, MD, 1,000 mg at 05/16/15 0042  PHYSICAL EXAM:  BP 155/62 mmHg  Pulse 57  Temp(Src) 98.4 F (36.9 C) (Oral)  Resp 20  Ht $R'5\' 5"'RU$  (1.651 m)  Wt 58.968 kg (130 lb)  BMI 21.63 kg/m2  SpO2 97%  General: elderly  female, in NAD HEENT: PERRL; OP moist without lesions. Neck: supple, trachea midline, no thyromegaly Chest: normal to palpation Lungs: decreased airflow with coarse breath sounds; no wheeze or retractions Cardiovascular: RRR,  no gallop; distal  pulses 2+ Abdomen: soft, nontender, nondistended, positive bowel sounds Extremities: no clubbing, cyanosis, edema.  No synovitis Neuro: alert and oriented, moves all extremities Derm: no significant rashes or nodules; good skin turgor Lymph: no cervical or supraclavicular lymphadenopathy  Labs and imaging studies were reviewed  ASSESSMENT/PLAN:   1. Pneumonia- appreciate pulmonary input; still watching sputum cx for results to help guide abx therapy.  Contact isolation due to MRSA history.  Send sputum for possible MAI. 2. Elevated ANA/RF- appreciate rheumatology input; additional labs pending.  Will stop hydralazine in event it is contributing to positive ANA (though would expect anti-histone with this).   3. Acute COPD exacerbation- continues to improve; convert to PO steroids 4. HTN- change hydralazine to losartan; following BP, met b 5. D/c plan- difficult to convert to PO abx regimen without organism and with pt having recurrent/persistent pneumonia.  Once on PO regimen, would anticipate watching x 24 hrs to make sure continues to improve.  Up with PT today, though suspect will do well.

## 2015-05-16 NOTE — Plan of Care (Signed)
Problem: Discharge Progression Outcomes Goal: Discharge plan in place and appropriate Outcome: Progressing Plan of care progress: Pneumonia: -continues IV ABTs -2L o2 [place] PRN, o2 sat 90% with exertion on room air -afebrile -monitor labs, WBC -PRN xanax for anxiety with relief -no complaints of pain, no distress or discomfort noted, voices any needs

## 2015-05-16 NOTE — Evaluation (Signed)
Physical Therapy Evaluation Patient Details Name: ANURADHA CHABOT MRN: [id number] DOB: 05-Sep-1941 Today's Date: 05/16/2015   History of Present Illness  74 yo female with onset of acute respiratory failure and R UL infiltrate was referred to PT to assess mobility.  Pt has PMHx:  COPD, PNA, MRSA CA, anxiety, B 12 defic, CHF  Clinical Impression  Pt was seen for assessment of tolerance of activity, but did not demonstrate losses of O2 beyond 90% with no O2 on.  Her Sat was 100% at rest on O2, then 97% with no O2 sitting.  Her walks were done and noted her sat down to 90%.    Follow Up Recommendations No PT follow up    Equipment Recommendations  None recommended by PT    Recommendations for Other Services       Precautions / Restrictions Precautions Precautions: Fall Restrictions Weight Bearing Restrictions: No      Mobility  Bed Mobility Overal bed mobility: Modified Independent                Transfers Overall transfer level: Modified independent Equipment used: None             General transfer comment: cued hand placement initially  Ambulation/Gait Ambulation/Gait assistance: Supervision (for safety) Ambulation Distance (Feet): 120 Feet Assistive device: None Gait Pattern/deviations: Step-through pattern;Decreased stride length;Narrow base of support;Trunk flexed;Drifts right/left Gait velocity: reduced' Gait velocity interpretation: Below normal speed for age/gender    Stairs            Wheelchair Mobility    Modified Rankin (Stroke Patients Only)       Balance Overall balance assessment: Modified Independent                                           Pertinent Vitals/Pain Pain Assessment: No/denies pain    Home Living Family/patient expects to be discharged to:: Private residence Living Arrangements: Spouse/significant other Available Help at Discharge: Family Type of Home: House Home Access: Stairs to enter               Prior Function Level of Independence: Independent               Hand Dominance        Extremity/Trunk Assessment   Upper Extremity Assessment: Overall WFL for tasks assessed           Lower Extremity Assessment: Overall WFL for tasks assessed      Cervical / Trunk Assessment: Normal  Communication   Communication: No difficulties  Cognition Arousal/Alertness: Awake/alert Behavior During Therapy: WFL for tasks assessed/performed Overall Cognitive Status: Within Functional Limits for tasks assessed                      General Comments General comments (skin integrity, edema, etc.): touches surfaces but not using any ffor outright wbing assistance    Exercises        Assessment/Plan    PT Assessment Patent does not need any further PT services  PT Diagnosis Other (comment) (Breathing changes with aerobic capacity loss)   PT Problem List    PT Treatment Interventions     PT Goals (Current goals can be found in the Care Plan section) Acute Rehab PT Goals Patient Stated Goal: none stated PT Goal Formulation: All assessment and education complete, DC therapy    Frequency  Barriers to discharge        Co-evaluation               End of Session Equipment Utilized During Treatment: Gait belt;Oxygen Activity Tolerance: Patient tolerated treatment well Patient left: in bed;with call bell/phone within reach;with family/visitor present Nurse Communication: Mobility status         Time: 1135-1204 PT Time Calculation (min) (ACUTE ONLY): 29 min   Charges:   PT Evaluation $Initial PT Evaluation Tier I: 1 Procedure PT Treatments $Gait Training: 8-22 mins   PT G Codes:        Ramond Dial 06-09-15, 1:22 PM   Mee Hives, PT MS Acute Rehab Dept. Number: [facility] [id number] and [place] [phone]

## 2015-05-17 LAB — CULTURE, BLOOD (ROUTINE X 2)
Culture: NO GROWTH
Culture: NO GROWTH

## 2015-05-17 LAB — VANCOMYCIN, TROUGH: Vancomycin Tr: 34 ug/mL (ref 10–20)

## 2015-05-17 LAB — BASIC METABOLIC PANEL
ANION GAP: 8 (ref 5–15)
BUN: 23 mg/dL — ABNORMAL HIGH (ref 6–20)
CO2: 35 mmol/L — AB (ref 22–32)
Calcium: 8.3 mg/dL — ABNORMAL LOW (ref 8.9–10.3)
Chloride: 92 mmol/L — ABNORMAL LOW (ref 101–111)
Creatinine, Ser: 0.72 mg/dL (ref 0.44–1.00)
GFR calc Af Amer: >60 mL/min (ref >60)
GFR calc non Af Amer: >60 mL/min (ref >60)
Glucose, Bld: 131 mg/dL — ABNORMAL HIGH (ref 65–99)
Potassium: 3.2 mmol/L — ABNORMAL LOW (ref 3.5–5.1)
SODIUM: 135 mmol/L (ref 135–145)

## 2015-05-17 MED ORDER — POTASSIUM CHLORIDE CRYS ER 20 MEQ PO TBCR
20.0000 meq | EXTENDED_RELEASE_TABLET | Freq: Once | ORAL | Status: AC
  Administered 2015-05-17: 09:00:00 20 meq via ORAL
  Filled 2015-05-17: qty 1

## 2015-05-17 MED ORDER — DOXYCYCLINE HYCLATE 100 MG PO TABS
100.0000 mg | ORAL_TABLET | Freq: Two times a day (BID) | ORAL | Status: DC
  Administered 2015-05-17 (×2): 100 mg via ORAL
  Filled 2015-05-17 (×3): qty 1

## 2015-05-17 MED ORDER — HEPARIN SODIUM (PORCINE) 5000 UNIT/ML IJ SOLN
5000.0000 [IU] | Freq: Two times a day (BID) | INTRAMUSCULAR | Status: DC
  Administered 2015-05-17 (×2): 5000 [IU] via SUBCUTANEOUS
  Filled 2015-05-17 (×2): qty 1

## 2015-05-17 MED ORDER — LEVOFLOXACIN 500 MG PO TABS
500.0000 mg | ORAL_TABLET | Freq: Every day | ORAL | Status: DC
  Administered 2015-05-17: 09:00:00 500 mg via ORAL
  Filled 2015-05-17: qty 1

## 2015-05-17 MED ORDER — AMOXICILLIN-POT CLAVULANATE 875-125 MG PO TABS: 1.0000 | ORAL_TABLET | Freq: Two times a day (BID) | ORAL | Status: DC

## 2015-05-17 NOTE — Care Management (Signed)
An important message from medicare about your rights (IM) given to Robin Hudson

## 2015-05-17 NOTE — Plan of Care (Signed)
Problem: Discharge Progression Outcomes Goal: Discharge plan in place and appropriate Outcome: Progressing Plan of care progress: Pneumonia: -ABTs now PO, prednisone PO -2L o2 [place] PRN, o2 sat 90% with exertion on room air -afebrile -monitor labs, WBC -PRN xanax for anxiety with relief -no complaints of pain, no distress or discomfort noted, voices any needs

## 2015-05-17 NOTE — Plan of Care (Signed)
Problem: Discharge Progression Outcomes Goal: Discharge plan in place and appropriate Outcome: Progressing Patient is alert and oriented, continues with scheduled Tramadol for chronic back pain. Independent in room. VSS, resting quietly. Remains on 2 L 02 [place].

## 2015-05-17 NOTE — Progress Notes (Signed)
SUBJECTIVE:  Admitted with pneumonia; h/o treatment for pneumonia 4/16, with MRSA from sputum culture, s/p treatment for this. Has had persistent infiltrate on CT/CXR.  Recently with diffuse myalgias; labs in office returned Friday with elevated ANA (1:320) in centromere pattern as well as elevated rheumatoid factor.  No new c/o; no fever, chills.  Not bringing up any sputum.  Sputum cx from several days ago without specific organism identified (GPC and gram neg diplococci without ID).  Not eating much; doesn't like the food ______________________________________________________________________  ROS: Please see HPI; remainder of complete 10 point ROS is negative   Past Medical History  Diagnosis Date  . COPD (chronic obstructive pulmonary disease)   . Hypertension   . Breast CA   . Colon cancer   . Asthma   . CHF (congestive heart failure)   . Hyperlipemia   . B12 deficiency   . Osteoporosis   . Right adrenal mass   . Allergic rhinitis   . Breast cancer   . Colon cancer   . MRSA pneumonia     Past Surgical History  Procedure Laterality Date  . Abdominal hysterectomy    . Appendectomy    . Colectomy      partial  . Image guided sinus surgery       Current facility-administered medications:  .  0.9 %  sodium chloride infusion, 250 mL, Intravenous, PRN, Dustin Flock, MD .  acetaminophen (TYLENOL) tablet 650 mg, 650 mg, Oral, Q6H PRN **OR** acetaminophen (TYLENOL) suppository 650 mg, 650 mg, Rectal, Q6H PRN, Dustin Flock, MD .  albuterol (PROVENTIL) (2.5 MG/3ML) 0.083% nebulizer solution 2.5 mg, 2.5 mg, Nebulization, Q6H PRN, Fritzi Mandes, MD .  ALPRAZolam Duanne Moron) tablet 0.5 mg, 0.5 mg, Oral, TID PRN, Dustin Flock, MD, 0.5 mg at 05/16/15 1415 .  amoxicillin-clavulanate (AUGMENTIN) 875-125 MG per tablet 1 tablet, 1 tablet, Oral, Q12H, Tama High III, MD .  atenolol (TENORMIN) tablet 50 mg, 50 mg, Oral, Daily, Dustin Flock, MD, 50 mg at 05/16/15 0825 .  escitalopram  (LEXAPRO) tablet 10 mg, 10 mg, Oral, Daily, Dustin Flock, MD, 10 mg at 05/16/15 0825 .  fluticasone (FLONASE) 50 MCG/ACT nasal spray 1 spray, 1 spray, Each Nare, Daily, Dustin Flock, MD, 1 spray at 05/16/15 0826 .  heparin injection 5,000 Units, 5,000 Units, Subcutaneous, Q12H, Tama High III, MD .  HYDROcodone-acetaminophen (NORCO/VICODIN) 5-325 MG per tablet 1-2 tablet, 1-2 tablet, Oral, Q4H PRN, Dustin Flock, MD, 2 tablet at 05/14/15 1023 .  losartan (COZAAR) tablet 50 mg, 50 mg, Oral, Daily, Tama High III, MD, 50 mg at 05/16/15 0825 .  mometasone-formoterol (DULERA) 100-5 MCG/ACT inhaler 2 puff, 2 puff, Inhalation, BID, Dustin Flock, MD, 2 puff at 05/16/15 1946 .  montelukast (SINGULAIR) tablet 10 mg, 10 mg, Oral, QHS, Dustin Flock, MD, 10 mg at 05/16/15 2118 .  ondansetron (ZOFRAN) tablet 4 mg, 4 mg, Oral, Q6H PRN **OR** ondansetron (ZOFRAN) injection 4 mg, 4 mg, Intravenous, Q6H PRN, Dustin Flock, MD .  potassium chloride SA (K-DUR,KLOR-CON) CR tablet 20 mEq, 20 mEq, Oral, Once, Tama High III, MD .  predniSONE (DELTASONE) tablet 50 mg, 50 mg, Oral, Q breakfast, Tama High III, MD, 50 mg at 05/16/15 0825 .  sodium chloride 0.9 % injection 3 mL, 3 mL, Intravenous, Q12H, Dustin Flock, MD, 3 mL at 05/16/15 2118 .  sodium chloride 0.9 % injection 3 mL, 3 mL, Intravenous, PRN, Dustin Flock, MD, 3 mL at 05/15/15 0509 .  tiotropium (SPIRIVA)  inhalation capsule 18 mcg, 18 mcg, Inhalation, Daily, Dustin Flock, MD, 18 mcg at 05/16/15 0826 .  traMADol (ULTRAM) tablet 50 mg, 50 mg, Oral, 4 times per day, Adrian Prows, MD, 50 mg at 05/17/15 0537  PHYSICAL EXAM:  BP 147/62 mmHg  Pulse 57  Temp(Src) 97.7 F (36.5 C) (Oral)  Resp 18  Ht '5\' 5"'$  (1.651 m)  Wt 58.968 kg (130 lb)  BMI 21.63 kg/m2  SpO2 98%  General: elderly  female, in NAD HEENT: PERRL; OP moist without lesions. Neck: supple, trachea midline, no thyromegaly Chest: normal to palpation Lungs: right side  crackles; no wheeze or retractions Cardiovascular: RRR,  no gallop; distal pulses 2+ Abdomen: soft, nontender, nondistended, positive bowel sounds Neuro: alert and oriented, moves all extremities Extremities: no clubbing, cyanosis, edema.   Derm: no significant rashes or nodules; good skin turgor Lymph: no cervical or supraclavicular lymphadenopathy  Labs and imaging studies were reviewed  ASSESSMENT/PLAN:   1. Pneumonia- sputum cx unrevealing; change to PO abx and follow CBC, temp.  If stable, home on these in AM. 2. Elevated ANA/RF- appreciate rheumatology input; much improved on steroids.  Anticipate f/u with rheumatology as outpt   3. Acute COPD exacerbation- better; cont current regimen, with slow wean of steroids, given elevated ANA 4. HTN- changed hydralazine to losartan; following BP. Supplement K and follow 5. D/c plan- if stable, home in AM

## 2015-05-18 LAB — CBC
HCT: 37.2 % (ref 35.0–47.0)
Hemoglobin: 12.3 g/dL (ref 12.0–16.0)
MCH: 27.5 pg (ref 26.0–34.0)
MCHC: 33.1 g/dL (ref 32.0–36.0)
MCV: 82.9 fL (ref 80.0–100.0)
Platelets: 322 K/uL (ref 150–440)
RBC: 4.49 MIL/uL (ref 3.80–5.20)
RDW: 14.8 % — ABNORMAL HIGH (ref 11.5–14.5)
WBC: 10.9 K/uL (ref 3.6–11.0)

## 2015-05-18 LAB — BASIC METABOLIC PANEL
ANION GAP: 6 (ref 5–15)
BUN: 25 mg/dL — AB (ref 6–20)
CALCIUM: 8.7 mg/dL — AB (ref 8.9–10.3)
CHLORIDE: 94 mmol/L — AB (ref 101–111)
CO2: 37 mmol/L — ABNORMAL HIGH (ref 22–32)
Creatinine, Ser: 0.73 mg/dL (ref 0.44–1.00)
GFR calc Af Amer: >60 mL/min (ref >60)
GFR calc non Af Amer: >60 mL/min (ref >60)
GLUCOSE: 85 mg/dL (ref 65–99)
Potassium: 3.9 mmol/L (ref 3.5–5.1)
Sodium: 137 mmol/L (ref 135–145)

## 2015-05-18 MED ORDER — LOSARTAN POTASSIUM 50 MG PO TABS: 50.0000 mg | ORAL_TABLET | Freq: Every day | ORAL | Status: AC

## 2015-05-18 MED ORDER — PREDNISONE 10 MG (21) PO TBPK: ORAL_TABLET | ORAL | Status: DC

## 2015-05-18 MED ORDER — ATENOLOL 50 MG PO TABS: 50.0000 mg | ORAL_TABLET | Freq: Every day | ORAL | Status: DC

## 2015-05-18 MED ORDER — DOXYCYCLINE HYCLATE 100 MG PO TABS: 100.0000 mg | ORAL_TABLET | Freq: Two times a day (BID) | ORAL | Status: DC

## 2015-05-18 MED ORDER — LEVOFLOXACIN 500 MG PO TABS: 500.0000 mg | ORAL_TABLET | Freq: Every day | ORAL | Status: DC

## 2015-05-18 NOTE — Progress Notes (Addendum)
Patient had no c/o pain this shift  Patient lung sounds are improved  Received MD order to discharge patient to home, discharged in wheelchair with husband to home

## 2015-05-18 NOTE — Plan of Care (Signed)
Problem: Discharge Progression Outcomes Goal: Discharge plan in place and appropriate Outcome: Progressing Plan of care progress: Pneumonia: -ABTs now PO, prednisone PO -2L o2 [place] PRN, o2 sat 90% with exertion on room air -afebrile -monitor labs, WBC -PRN xanax for anxiety with relief -no complaints of pain, no distress or discomfort noted, voices any needs

## 2015-05-18 NOTE — Discharge Summary (Signed)
Physician Discharge Summary  Patient ID: Robin Hudson MRN: [id number] DOB/AGE: 07-25-1941 74 y.o.  Admit date: 05/12/2015 Discharge date: 05/18/2015  Admission Diagnoses: Pneumonia Discharge Diagnoses:  Principal Problem:   Pneumonia Active Problems:   SOB (shortness of breath)   COPD (chronic obstructive pulmonary disease)   HTN (hypertension)   Elevated antinuclear antibody (ANA) level acute COPD exacerbation  Discharged Condition: stable  Hospital Course: admitted with pneumonia, acute COPD exacerbation, acute resp failure; had had persistent infiltrate on CXR and CT, with recent finding of elevated ANA (1:320 in centromere pattern) and RA titer.  Responded well to antibiotics, with correction of leukocytosis. Steroids added for COPD, with marked improvement in chronic joint pain.  Sputum cx sent, c/w normal flora.  Pt placed on contact isolation due to MRSA in sputum 4/16.  MRSA swab of nose negative  Pulmonary and Rheumatology saw pt.  Pt taken off hydralazine, placed on losartan, with good BP control.  Plan for pt to f/u with rheumatology as outpt, with pulmonary f/u if infiltrate persists.   PT worked with pt; no new needs. Will d/c to home in stable condition, on 2L [place] oxygen.  Diet is low sodium; encouraged fluids.  Up as tolerated. Return to our office in 10 days, and to rheumatology in 2 wkzs  Discharge Exam: Blood pressure 127/56, pulse 57, temperature 97.6 F (36.4 C), temperature source Oral, resp. rate 17, height '5\' 5"'$  (1.651 m), weight 58.968 kg (130 lb), SpO2 97 %.  Disposition: 01-Home or Self Care  Discharge Instructions    Diet - low sodium heart healthy    Complete by:  As directed      Increase activity slowly    Complete by:  As directed             Medication List    STOP taking these medications        hydrALAZINE 25 MG tablet  Commonly known as:  APRESOLINE      TAKE these medications        ADVAIR DISKUS 250-50 MCG/DOSE Aepb  Generic  drug:  Fluticasone-Salmeterol  Take 1 puff by mouth 2 (two) times daily.     ALPRAZolam 0.5 MG tablet  Commonly known as:  XANAX  Take 1 tablet by mouth 2 (two) times daily as needed.     atenolol 50 MG tablet  Commonly known as:  TENORMIN  Take 1 tablet (50 mg total) by mouth daily.     doxycycline 100 MG tablet  Commonly known as:  VIBRA-TABS  Take 1 tablet (100 mg total) by mouth every 12 (twelve) hours. X 7 days     DULoxetine 20 MG capsule  Commonly known as:  CYMBALTA  Take 1 capsule by mouth daily.     escitalopram 10 MG tablet  Commonly known as:  LEXAPRO  Take 1 tablet by mouth daily.     HYDROcodone-acetaminophen 10-325 MG per tablet  Commonly known as:  NORCO  Take 1 tablet by mouth every 6 (six) hours as needed (Took half a tablet on wednesday.).     levofloxacin 500 MG tablet  Commonly known as:  LEVAQUIN  Take 1 tablet (500 mg total) by mouth daily. X 7 days     losartan 50 MG tablet  Commonly known as:  COZAAR  Take 1 tablet (50 mg total) by mouth daily.     montelukast 10 MG tablet  Commonly known as:  SINGULAIR  Take 1 tablet by mouth daily.  predniSONE 10 MG (21) Tbpk tablet  Commonly known as:  STERAPRED UNI-PAK 21 TAB  Start 4 po daily; decrease by 1 every 2 days     PROAIR HFA 108 (90 BASE) MCG/ACT inhaler  Generic drug:  albuterol  Take 2 puffs by mouth every 6 (six) hours as needed.     SPIRIVA HANDIHALER 18 MCG inhalation capsule  Generic drug:  tiotropium  Take 1 Inhaler by mouth daily.     VITAMIN B 12 PO  Take 1 capsule by mouth daily.     VITAMIN D (CHOLECALCIFEROL) PO  Take 1 capsule by mouth daily.     vitamin E 100 UNIT capsule  Take 100 Units by mouth daily.           Follow-up Information    Follow up with BERT Briscoe Burns III, MD In 10 days.   Specialty:  Internal Medicine   Contact information:   [place] [address] [phone]       Follow up with Emmaline Kluver, MD In 2 weeks.    Specialty:  Rheumatology   Contact information:   [place] [address] [phone]       Signed: Tama High III 05/18/2015, 7:35 AM

## 2015-07-05 NOTE — Patient Outreach (Signed)
[place] [facility]) Care Management  07/05/2015  XCARET MORAD [birth year] [id number]   Referral from HTA tier 4 list, assigned to Sherrin Daisy, [facility] for patient outreach.  Billy Turvey L. Carnesha Maravilla, [place] Care Management Assistant

## 2015-07-27 ENCOUNTER — Other Ambulatory Visit: Payer: Self-pay | Admitting: *Deleted

## 2015-07-27 ENCOUNTER — Encounter: Payer: Self-pay | Admitting: *Deleted

## 2015-07-27 NOTE — Patient Outreach (Signed)
[facility]) Care Management  07/27/2015  Robin Hudson 1941/04/17 [id number]   Health team advantage tier 4 referral:   Telephone call to patient who was advised of reason for call and of [facility] care management services.   Patient voices he has no health care concerns that require case management at this time. States he has no difficulty seeing primary care providers or other required specialists for her healthcare. Reports she still drives and is able to get to appointments without problems. States she has family members and good friends to call on if needed.   Patient has declined [facility] care management services. Contact information given to patient as needed.   Plan: Close case and send closure letter to MD.  Sherrin Daisy, [place] Management Coordinator [facility] Care Management  [phone]

## 2015-07-28 NOTE — Patient Outreach (Signed)
[facility]) Care Management  07/28/2015  Robin Hudson [birth year] [id number]   Notification from Sherrin Daisy, RN to close case due to patient refused [place] Management services.  Thanks, Ronnell Freshwater. [place], [place] Assistant Phone: [phone] Fax: [phone]

## 2015-08-23 ENCOUNTER — Other Ambulatory Visit: Payer: Self-pay | Admitting: Internal Medicine

## 2015-08-23 DIAGNOSIS — R9389 Abnormal findings on diagnostic imaging of other specified body structures: Secondary | ICD-10-CM

## 2015-08-23 DIAGNOSIS — Z1231 Encounter for screening mammogram for malignant neoplasm of breast: Secondary | ICD-10-CM

## 2015-08-28 ENCOUNTER — Other Ambulatory Visit: Payer: PPO

## 2015-08-28 ENCOUNTER — Ambulatory Visit: Payer: PPO | Admitting: Oncology

## 2015-08-28 ENCOUNTER — Ambulatory Visit
Admission: RE | Admit: 2015-08-28 | Discharge: 2015-08-28 | Disposition: A | Discharge status: Home / Self Care | Payer: PPO | Source: Ambulatory Visit | Attending: Internal Medicine | Admitting: Internal Medicine

## 2015-08-28 DIAGNOSIS — R9389 Abnormal findings on diagnostic imaging of other specified body structures: Secondary | ICD-10-CM

## 2015-08-28 DIAGNOSIS — R938 Abnormal findings on diagnostic imaging of other specified body structures: Secondary | ICD-10-CM | POA: Insufficient documentation

## 2015-08-28 DIAGNOSIS — J439 Emphysema, unspecified: Secondary | ICD-10-CM | POA: Diagnosis not present

## 2015-08-30 ENCOUNTER — Ambulatory Visit
Admission: RE | Admit: 2015-08-30 | Discharge: 2015-08-30 | Disposition: A | Discharge status: Home / Self Care | Payer: PPO | Source: Ambulatory Visit | Attending: Internal Medicine | Admitting: Internal Medicine

## 2015-08-30 ENCOUNTER — Other Ambulatory Visit: Payer: Self-pay | Admitting: Internal Medicine

## 2015-08-30 DIAGNOSIS — N6310 Unspecified lump in the right breast, unspecified quadrant: Secondary | ICD-10-CM

## 2015-08-30 DIAGNOSIS — N63 Unspecified lump in breast: Secondary | ICD-10-CM | POA: Diagnosis present

## 2015-08-30 DIAGNOSIS — Z1231 Encounter for screening mammogram for malignant neoplasm of breast: Secondary | ICD-10-CM

## 2015-08-30 DIAGNOSIS — R921 Mammographic calcification found on diagnostic imaging of breast: Secondary | ICD-10-CM | POA: Diagnosis not present

## 2015-08-30 DIAGNOSIS — R928 Other abnormal and inconclusive findings on diagnostic imaging of breast: Secondary | ICD-10-CM | POA: Diagnosis not present

## 2015-08-30 HISTORY — DX: Reserved for concepts with insufficient information to code with codable children: IMO0002

## 2015-08-30 HISTORY — DX: Malignant neoplasm of unspecified site of right female breast: C50.911

## 2015-09-01 ENCOUNTER — Other Ambulatory Visit: Payer: Self-pay | Admitting: *Deleted

## 2015-09-01 DIAGNOSIS — C189 Malignant neoplasm of colon, unspecified: Secondary | ICD-10-CM

## 2015-09-05 ENCOUNTER — Other Ambulatory Visit: Payer: Self-pay | Admitting: Specialist

## 2015-09-05 DIAGNOSIS — R911 Solitary pulmonary nodule: Secondary | ICD-10-CM

## 2015-09-06 ENCOUNTER — Inpatient Hospital Stay: Payer: PPO | Attending: Oncology

## 2015-09-06 ENCOUNTER — Inpatient Hospital Stay: Payer: PPO | Admitting: Oncology

## 2015-09-23 ENCOUNTER — Encounter: Payer: Self-pay | Admitting: Emergency Medicine

## 2015-09-23 ENCOUNTER — Emergency Department: Payer: PPO

## 2015-09-23 ENCOUNTER — Emergency Department
Admission: EM | Admit: 2015-09-23 | Discharge: 2015-09-23 | Disposition: A | Discharge status: Home / Self Care | Payer: PPO | Attending: Emergency Medicine | Admitting: Emergency Medicine

## 2015-09-23 DIAGNOSIS — Z7952 Long term (current) use of systemic steroids: Secondary | ICD-10-CM | POA: Diagnosis not present

## 2015-09-23 DIAGNOSIS — Z792 Long term (current) use of antibiotics: Secondary | ICD-10-CM | POA: Insufficient documentation

## 2015-09-23 DIAGNOSIS — Z720 Tobacco use: Secondary | ICD-10-CM | POA: Insufficient documentation

## 2015-09-23 DIAGNOSIS — R0600 Dyspnea, unspecified: Secondary | ICD-10-CM

## 2015-09-23 DIAGNOSIS — Z7951 Long term (current) use of inhaled steroids: Secondary | ICD-10-CM | POA: Insufficient documentation

## 2015-09-23 DIAGNOSIS — Z79899 Other long term (current) drug therapy: Secondary | ICD-10-CM | POA: Diagnosis not present

## 2015-09-23 DIAGNOSIS — J441 Chronic obstructive pulmonary disease with (acute) exacerbation: Secondary | ICD-10-CM | POA: Diagnosis not present

## 2015-09-23 DIAGNOSIS — I10 Essential (primary) hypertension: Secondary | ICD-10-CM | POA: Insufficient documentation

## 2015-09-23 DIAGNOSIS — R05 Cough: Secondary | ICD-10-CM | POA: Diagnosis present

## 2015-09-23 LAB — CBC
HCT: 35.4 % (ref 35.0–47.0)
HEMOGLOBIN: 11.7 g/dL — AB (ref 12.0–16.0)
MCH: 27.6 pg (ref 26.0–34.0)
MCHC: 33.1 g/dL (ref 32.0–36.0)
MCV: 83.4 fL (ref 80.0–100.0)
Platelets: 263 10*3/uL (ref 150–440)
RBC: 4.25 MIL/uL (ref 3.80–5.20)
RDW: 14.6 % — AB (ref 11.5–14.5)
WBC: 11.5 10*3/uL — ABNORMAL HIGH (ref 3.6–11.0)

## 2015-09-23 LAB — COMPREHENSIVE METABOLIC PANEL
ALK PHOS: 56 U/L (ref 38–126)
ALT: 10 U/L — ABNORMAL LOW (ref 14–54)
AST: 14 U/L — AB (ref 15–41)
Albumin: 3.5 g/dL (ref 3.5–5.0)
Anion gap: 5 (ref 5–15)
BUN: 12 mg/dL (ref 6–20)
CALCIUM: 8.7 mg/dL — AB (ref 8.9–10.3)
CO2: 29 mmol/L (ref 22–32)
Chloride: 101 mmol/L (ref 101–111)
Creatinine, Ser: 0.73 mg/dL (ref 0.44–1.00)
GFR calc Af Amer: >60 mL/min (ref >60)
GFR calc non Af Amer: >60 mL/min (ref >60)
Glucose, Bld: 107 mg/dL — ABNORMAL HIGH (ref 65–99)
POTASSIUM: 3.9 mmol/L (ref 3.5–5.1)
SODIUM: 135 mmol/L (ref 135–145)
TOTAL PROTEIN: 7 g/dL (ref 6.5–8.1)
Total Bilirubin: 0.6 mg/dL (ref 0.3–1.2)

## 2015-09-23 LAB — TROPONIN I: Troponin I: <0.03 ng/mL (ref <0.031)

## 2015-09-23 LAB — FIBRIN DERIVATIVES D-DIMER (ARMC ONLY): Fibrin derivatives D-dimer (ARMC): 1894 — ABNORMAL HIGH (ref 0–499)

## 2015-09-23 MED ORDER — IOHEXOL 350 MG/ML SOLN
100.0000 mL | Freq: Once | INTRAVENOUS | Status: AC | PRN
  Administered 2015-09-23: 75 mL via INTRAVENOUS

## 2015-09-23 MED ORDER — LEVOFLOXACIN 750 MG PO TABS: 750.0000 mg | ORAL_TABLET | Freq: Every day | ORAL | Status: AC

## 2015-09-23 MED ORDER — HYDROCODONE-ACETAMINOPHEN 5-325 MG PO TABS
1.0000 | ORAL_TABLET | Freq: Once | ORAL | Status: AC
  Administered 2015-09-23: 1 via ORAL
  Filled 2015-09-23: qty 1

## 2015-09-23 MED ORDER — LEVOFLOXACIN IN D5W 750 MG/150ML IV SOLN
750.0000 mg | INTRAVENOUS | Status: DC
  Administered 2015-09-23: 750 mg via INTRAVENOUS
  Filled 2015-09-23: qty 150

## 2015-09-23 NOTE — ED Notes (Signed)
Productive cough ( yellowish/greenish sputum), on Z-pack currently.

## 2015-09-23 NOTE — ED Provider Notes (Signed)
Time Seen: Approximately *----------------------------------------- '@10'$ :51 AM on 09/23/2015 -----------------------------------------   I have reviewed the triage notes  Chief Complaint: Cough   History of Present Illness: Robin Hudson is a 74 y.o. female who's had a recent treatment with a prescription for Zithromax for a cough which is been mostly clear but has had occasional productive cough with yellow to green tinged sputum. She denies any fever at home though states that she has some mild left-sided chest discomfort. She denies any hemoptysis or wheezing at home. She states she's on day 3 of her Zithromax prescription. She denies any calf tenderness or peripheral edema. Shortness of breath is mainly exacerbated by ambulation. She states overall she's been feeling ill for approximately 5 days   Past Medical History  Diagnosis Date  . COPD (chronic obstructive pulmonary disease) ([place])   . Hypertension   . Asthma   . CHF (congestive heart failure) ([place])   . Hyperlipemia   . B12 deficiency   . Osteoporosis   . Right adrenal mass ([place])   . Allergic rhinitis   . MRSA pneumonia ([place])   . Colon cancer ([place]) 2010  . Colon cancer ([place])   . Carcinoma of right breast treated with adjuvant chemotherapy ([place]) 1999    RT LUMPECTOMY  . Breast cancer ([place]) 1999    RT LUMPECTOMY  . Radiation 1999    BREAST CA    Patient Active Problem List   Diagnosis Date Noted  . Elevated antinuclear antibody (ANA) level 05/15/2015  . Pneumonia 05/15/2015  . SOB (shortness of breath) 05/12/2015  . Hyperlipemia 05/12/2015  . COPD (chronic obstructive pulmonary disease) ([place]) 05/12/2015  . HTN (hypertension) 05/12/2015  . Osteoporosis 05/12/2015  . Right adrenal mass ([place]) 05/12/2015  . B12 deficiency 05/12/2015  . Allergic rhinitis 05/12/2015  . Breast cancer ([place]) 05/12/2015  . Colon cancer ([place]) 05/12/2015  . MRSA pneumonia ([place]) 05/12/2015  . Closed head injury 12/15/2014    Past  Surgical History  Procedure Laterality Date  . Abdominal hysterectomy    . Appendectomy    . Colectomy      partial  . Image guided sinus surgery      Past Surgical History  Procedure Laterality Date  . Abdominal hysterectomy    . Appendectomy    . Colectomy      partial  . Image guided sinus surgery      Current Outpatient Rx  Name  Route  Sig  Dispense  Refill  . ADVAIR DISKUS 250-50 MCG/DOSE AEPB   Oral   Take 1 puff by mouth 2 (two) times daily.      4     Dispense as written.   Marland Kitchen ALPRAZolam (XANAX) 0.5 MG tablet   Oral   Take 1 tablet by mouth 2 (two) times daily as needed.      3   . atenolol (TENORMIN) 50 MG tablet   Oral   Take 1 tablet (50 mg total) by mouth daily.   30 tablet   11   . Cyanocobalamin (VITAMIN B 12 PO)   Oral   Take 1 capsule by mouth daily.         Marland Kitchen doxycycline (VIBRA-TABS) 100 MG tablet   Oral   Take 1 tablet (100 mg total) by mouth every 12 (twelve) hours. X 7 days   14 tablet   0   . DULoxetine (CYMBALTA) 20 MG capsule   Oral   Take 1 capsule by mouth daily.  5   . escitalopram (LEXAPRO) 10 MG tablet   Oral   Take 1 tablet by mouth daily.      11   . HYDROcodone-acetaminophen (NORCO) 10-325 MG per tablet   Oral   Take 1 tablet by mouth every 6 (six) hours as needed (Took half a tablet on wednesday.).       0   . levofloxacin (LEVAQUIN) 750 MG tablet   Oral   Take 1 tablet (750 mg total) by mouth daily.   6 tablet   0   . losartan (COZAAR) 50 MG tablet   Oral   Take 1 tablet (50 mg total) by mouth daily.   30 tablet   11   . montelukast (SINGULAIR) 10 MG tablet   Oral   Take 1 tablet by mouth daily.      3   . predniSONE (STERAPRED UNI-PAK 21 TAB) 10 MG (21) TBPK tablet      Start 4 po daily; decrease by 1 every 2 days   21 tablet   0   . PROAIR HFA 108 (90 BASE) MCG/ACT inhaler   Oral   Take 2 puffs by mouth every 6 (six) hours as needed.      12     Dispense as written.   Marland Kitchen SPIRIVA  HANDIHALER 18 MCG inhalation capsule   Oral   Take 1 Inhaler by mouth daily.      11     Dispense as written.   Marland Kitchen VITAMIN D, CHOLECALCIFEROL, PO   Oral   Take 1 capsule by mouth daily.         . vitamin E 100 UNIT capsule   Oral   Take 100 Units by mouth daily.           Allergies:  Coreg  Family History: Family History  Problem Relation Age of Onset  . Heart attack Mother   . Breast cancer Sister 10  . Breast cancer Sister 75  . Breast cancer Other     Social History: Social History  Substance Use Topics  . Smoking status: Current Every Day Smoker -- 0.25 packs/day    Types: Cigarettes  . Smokeless tobacco: Never Used  . Alcohol Use: No     Review of Systems:   10 point review of systems was performed and was otherwise negative:  Constitutional: No fever Eyes: No visual disturbances ENT: No sore throat, ear pain Cardiac: No chest pain Respiratory: Shortness of breath as described above Abdomen: No abdominal pain, no vomiting, No diarrhea Endocrine: No weight loss, No night sweats Extremities: No peripheral edema, cyanosis Skin: No rashes, easy bruising Neurologic: No focal weakness, trouble with speech or swollowing Urologic: No dysuria, Hematuria, or urinary frequency   Physical Exam:  ED Triage Vitals  Enc Vitals Group     BP 09/23/15 1036 116/70 mmHg     Pulse Rate 09/23/15 1036 81     Resp 09/23/15 1036 18     Temp 09/23/15 1036 98.3 F (36.8 C)     Temp Source 09/23/15 1036 Oral     SpO2 09/23/15 1036 97 %     Weight 09/23/15 1036 130 lb (58.968 kg)     Height 09/23/15 1036 '5\' 4"'$  (1.626 m)     Head Cir --      Peak Flow --      Pain Score 09/23/15 1037 7     Pain Loc --      Pain Edu? --  Excl. in [place]? --     General: Awake , Alert , and Oriented times 3; GCS 15 Head: Normal cephalic , atraumatic Eyes: Pupils equal , round, reactive to light Nose/Throat: No nasal drainage, patent upper airway without erythema or exudate.   Neck: Supple, Full range of motion, No anterior adenopathy or palpable thyroid masses Lungs: Decreased breath sounds bilaterally at the bases with some mild rhonchi, no rales or wheezing is noted Heart: Regular rate, regular rhythm without murmurs , gallops , or rubs Abdomen: Soft, non tender without rebound, guarding , or rigidity; bowel sounds positive and symmetric in all 4 quadrants. No organomegaly .        Extremities: 2 plus symmetric pulses. No edema, clubbing or cyanosis Neurologic: normal ambulation, Motor symmetric without deficits, sensory intact Skin: warm, dry, no rashes   Labs:   All laboratory work was reviewed including any pertinent negatives or positives listed below:  Labs Reviewed  CBC - Abnormal; Notable for the following:    WBC 11.5 (*)    Hemoglobin 11.7 (*)    RDW 14.6 (*)    All other components within normal limits  COMPREHENSIVE METABOLIC PANEL - Abnormal; Notable for the following:    Glucose, Bld 107 (*)    Calcium 8.7 (*)    AST 14 (*)    ALT 10 (*)    All other components within normal limits  FIBRIN DERIVATIVES D-DIMER ([facility] ONLY) - Abnormal; Notable for the following:    Fibrin derivatives D-dimer [facility]) 1894 (*)    All other components within normal limits  TROPONIN I   patient's D-dimer test was performed due to her previous hospitalization for pneumonia.  EKG: ED ECG REPORT I, Daymon Larsen, the attending physician, personally viewed and interpreted this ECG.  Date: 09/23/2015 EKG Time: 1031 Rate: 77 Rhythm: normal sinus rhythm with occasional premature ventricular contractions QRS Axis: normal Intervals: normal ST/T Wave abnormalities: normal Conduction Disutrbances: none Narrative Interpretation: unremarkable  Radiology:     EXAM: CT ANGIOGRAPHY CHEST WITH CONTRAST  TECHNIQUE: Multidetector CT imaging of the chest was performed using the standard protocol during bolus administration of intravenous contrast. Multiplanar  CT image reconstructions and MIPs were obtained to evaluate the vascular anatomy.  CONTRAST: 75 cc Omnipaque 350  COMPARISON: Chest CT - 08/28/2015; 05/08/2015; 11/11/2013  FINDINGS: Vascular Findings:  There is adequate opacification of the pulmonary arterial system with the main pulmonary artery measuring 406 Hounsfield units. There is tapered narrowing involving the superior segmental right pulmonary artery (representative image 65, series 6) at the location of right upper lobe atelectasis/collapse. No discrete intraluminal filling defect suggest pulmonary embolism. Normal caliber the main pulmonary artery.  Normal heart size. Coronary artery calcifications. No pericardial effusion.  Scattered atherosclerotic plaque within a normal caliber thoracic aorta. No definite thoracic aortic dissection on this nongated examination. No periaortic stranding. Conventional configuration of the aortic arch. The branch vessels of the aortic arch appear widely patent throughout their imaged course.  Review of the MIP images confirms the above findings.   ----------------------------------------------------------------------------------  Nonvascular Findings:  Moderate to severe centrilobular and paraseptal emphysematous change. Asymmetric biapical pleural parenchymal thickening is grossly unchanged, right greater than left (representative images 17 and 25, series 7).  Worsening ill-defined slightly nodular ground-glass opacities within the caudal segment of the lingula (image 76, series 5) as well as the bilateral lung bases (representative images 92, 103 and 115, series 7). There is persistent partial atelectasis/collapse of the right upper lobe. The central  pulmonary airways appear otherwise patent.  Borderline enlarged right suprahilar nodal conglomeration measuring 1.2 cm in greatest short axis diameter (44, series 5). Additional scattered shotty mediastinal lymph nodes  individually not enlarged by size criteria with index sub carinal nodal conglomeration measuring 1 cm in greatest short axis diameter. No axillary lymphadenopathy.  Limited early arterial phase evaluation of the upper abdomen re- demonstrates a shotty lymph node posterior to the distal esophagus which measures 0.6 cm in greatest short axis diameter (image 120, series 5).  Unchanged approximately 1.9 x 2.1 cm nodule within the right adrenal gland (image 132, series 5), unchanged since the 10/2013 examination.  No acute or aggressive osseous abnormalities.  Regional soft tissues appear normal. Normal appearance of the thyroid gland.  IMPRESSION: 1. No evidence of pulmonary embolism. 2. Worsening bibasilar and peripheral predominant ground-glass opacities, nonspecific though suggestive of recurrent multifocal atypical infection. 3. Chronic biapical pleural parenchymal thickening, right greater than left. 4. Moderate to severe paraseptal and centrilobular emphysematous change. 5. The approximately 2.1 cm nodule within the right adrenal gland is grossly unchanged since the 10/2013 examination. 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   Electronically Signed By: Sandi Mariscal M.D. On: 09/23/2015 13:53          DG Chest 2 View (Final result) Result time: 09/23/15 11:16:45   Final result by Rad Results In Interface (09/23/15 11:16:45)   Narrative:   CLINICAL DATA: 75 year old female with chest cold for the past 5 days and developing pain on inspiration. Clinical history includes current smoker, asthma, COPD and breast cancer status post lumpectomy in 1999 as well as colon cancer 6 years ago  EXAM: CHEST 2 VIEW  COMPARISON: Prior chest CT 08/28/2015; prior chest x-ray 05/12/2015  FINDINGS: Cardiac and mediastinal contours remain within  normal limits. Atherosclerotic calcifications present in the transverse aorta. Similar degree of right greater than left biapical pleural parenchymal scarring with associated architectural distortion. Pulmonary hyperinflation with upper lung predominant emphysema and diffuse mild bronchitic change. Surgical clips again noted in the right axilla. No focal airspace consolidation, pulmonary edema, pleural effusion or pneumothorax. No acute osseous abnormality.  IMPRESSION: Stable chest x-ray without evidence of acute cardiopulmonary process.  Chronic pulmonary parenchymal changes of COPD with advanced right greater than left biapical pleural parenchymal scarring and associated architectural distortion.   Electronically Signed By: Jacqulynn Cadet M.D. On: 09/23/2015 11:16        I personally reviewed the radiologic studies    ED Course: *  Patient's stay was uneventful and she was given IV fluids along with IV Levaquin after return of her chest CT may have shown some mild signs of community-acquired pneumonia. She exhibits no signs of hypoxemia on a continuous pulse oximetry. There is no signs of respiratory distress and the patient was sleeping comfortably periodically during her stay here in emergency department. Given a negative chest CT for pulmonary embolism I felt we could stop the evaluation at this point. Patient appears to be of understanding was given IV Levaquin here in emergency Department be discharged on Levaquin which she states has worked for her previously. She was advised to close out the prescription for the Zithromax answers only 2 more days remaining. She should drink plenty of fluids and contact her primary physician for further outpatient management.    Assessment:  Early community acquired pneumonia History of COPD Elevated troponin of unknown etiology  Final  Clinical Impression:   Final diagnoses:  Dyspnea     Plan:  Outpatient  management Patient was advised to return immediately if condition worsens. Patient was advised to follow up with her primary care physician or other specialized physicians involved and in their current assessment.            Daymon Larsen, MD 09/23/15 [phone]

## 2015-09-23 NOTE — Discharge Instructions (Signed)
Shortness of Breath Shortness of breath means you have trouble breathing. It could also mean that you have a medical problem. You should get immediate medical care for shortness of breath. CAUSES   Not enough oxygen in the air such as with high altitudes or a smoke-filled room.  Certain lung diseases, infections, or problems.  Heart disease or conditions, such as angina or heart failure.  Low red blood cells (anemia).  Poor physical fitness, which can cause shortness of breath when you exercise.  Chest or back injuries or stiffness.  Being overweight.  Smoking.  Anxiety, which can make you feel like you are not getting enough air. DIAGNOSIS  Serious medical problems can often be found during your physical exam. Tests may also be done to determine why you are having shortness of breath. Tests may include:  Chest X-rays.  Lung function tests.  Blood tests.  An electrocardiogram (ECG).  An ambulatory electrocardiogram. An ambulatory ECG records your heartbeat patterns over a 24-hour period.  Exercise testing.  A transthoracic echocardiogram (TTE). During echocardiography, sound waves are used to evaluate how blood flows through your heart.  A transesophageal echocardiogram (TEE).  Imaging scans. Your health care provider may not be able to find a cause for your shortness of breath after your exam. In this case, it is important to have a follow-up exam with your health care provider as directed.  TREATMENT  Treatment for shortness of breath depends on the cause of your symptoms and can vary greatly. HOME CARE INSTRUCTIONS   Do not smoke. Smoking is a common cause of shortness of breath. If you smoke, ask for help to quit.  Avoid being around chemicals or things that may bother your breathing, such as paint fumes and dust.  Rest as needed. Slowly resume your usual activities.  If medicines were prescribed, take them as directed for the full length of time directed. This  includes oxygen and any inhaled medicines.  Keep all follow-up appointments as directed by your health care provider. SEEK MEDICAL CARE IF:   Your condition does not improve in the time expected.  You have a hard time doing your normal activities even with rest.  You have any new symptoms. SEEK IMMEDIATE MEDICAL CARE IF:   Your shortness of breath gets worse.  You feel light-headed, faint, or develop a cough not controlled with medicines.  You start coughing up blood.  You have pain with breathing.  You have chest pain or pain in your arms, shoulders, or abdomen.  You have a fever.  You are unable to walk up stairs or exercise the way you normally do. MAKE SURE YOU:  Understand these instructions.  Will watch your condition.  Will get help right away if you are not doing well or get worse.   This information is not intended to replace advice given to you by your health care provider. Make sure you discuss any questions you have with your health care provider.   Document Released: 07/30/2001 Document Revised: 11/09/2013 Document Reviewed: 01/20/2012 Elsevier Interactive Patient Education [employer].  Please return immediately if condition worsens. Please contact her primary physician or the physician you were given for referral. If you have any specialist physicians involved in her treatment and plan please also contact them. Thank you for using [facility].

## 2015-12-02 ENCOUNTER — Inpatient Hospital Stay
Admission: EM | Admit: 2015-12-02 | Discharge: 2015-12-04 | DRG: 190 | Disposition: A | Discharge status: Home / Self Care | Payer: PPO | Attending: Internal Medicine | Admitting: Internal Medicine

## 2015-12-02 ENCOUNTER — Emergency Department
Admission: EM | Admit: 2015-12-02 | Discharge: 2015-12-02 | Disposition: A | Discharge status: Home / Self Care | Payer: PPO | Source: Home / Self Care | Attending: Emergency Medicine | Admitting: Emergency Medicine

## 2015-12-02 ENCOUNTER — Emergency Department: Payer: PPO

## 2015-12-02 ENCOUNTER — Encounter: Payer: Self-pay | Admitting: Emergency Medicine

## 2015-12-02 ENCOUNTER — Encounter: Payer: Self-pay | Admitting: Medical Oncology

## 2015-12-02 DIAGNOSIS — J159 Unspecified bacterial pneumonia: Secondary | ICD-10-CM | POA: Insufficient documentation

## 2015-12-02 DIAGNOSIS — Z85038 Personal history of other malignant neoplasm of large intestine: Secondary | ICD-10-CM

## 2015-12-02 DIAGNOSIS — Z7952 Long term (current) use of systemic steroids: Secondary | ICD-10-CM

## 2015-12-02 DIAGNOSIS — Z792 Long term (current) use of antibiotics: Secondary | ICD-10-CM

## 2015-12-02 DIAGNOSIS — R062 Wheezing: Secondary | ICD-10-CM | POA: Diagnosis not present

## 2015-12-02 DIAGNOSIS — J441 Chronic obstructive pulmonary disease with (acute) exacerbation: Secondary | ICD-10-CM | POA: Insufficient documentation

## 2015-12-02 DIAGNOSIS — R05 Cough: Secondary | ICD-10-CM | POA: Diagnosis not present

## 2015-12-02 DIAGNOSIS — R7989 Other specified abnormal findings of blood chemistry: Secondary | ICD-10-CM

## 2015-12-02 DIAGNOSIS — J9601 Acute respiratory failure with hypoxia: Secondary | ICD-10-CM | POA: Diagnosis not present

## 2015-12-02 DIAGNOSIS — Z853 Personal history of malignant neoplasm of breast: Secondary | ICD-10-CM | POA: Diagnosis not present

## 2015-12-02 DIAGNOSIS — I110 Hypertensive heart disease with heart failure: Secondary | ICD-10-CM | POA: Diagnosis present

## 2015-12-02 DIAGNOSIS — E785 Hyperlipidemia, unspecified: Secondary | ICD-10-CM | POA: Diagnosis present

## 2015-12-02 DIAGNOSIS — R0602 Shortness of breath: Secondary | ICD-10-CM | POA: Diagnosis not present

## 2015-12-02 DIAGNOSIS — J45909 Unspecified asthma, uncomplicated: Secondary | ICD-10-CM | POA: Diagnosis not present

## 2015-12-02 DIAGNOSIS — Z79899 Other long term (current) drug therapy: Secondary | ICD-10-CM

## 2015-12-02 DIAGNOSIS — J9621 Acute and chronic respiratory failure with hypoxia: Secondary | ICD-10-CM | POA: Diagnosis present

## 2015-12-02 DIAGNOSIS — Z79891 Long term (current) use of opiate analgesic: Secondary | ICD-10-CM

## 2015-12-02 DIAGNOSIS — E538 Deficiency of other specified B group vitamins: Secondary | ICD-10-CM | POA: Diagnosis not present

## 2015-12-02 DIAGNOSIS — I509 Heart failure, unspecified: Secondary | ICD-10-CM | POA: Diagnosis not present

## 2015-12-02 DIAGNOSIS — Z888 Allergy status to other drugs, medicaments and biological substances status: Secondary | ICD-10-CM

## 2015-12-02 DIAGNOSIS — Z9981 Dependence on supplemental oxygen: Secondary | ICD-10-CM | POA: Insufficient documentation

## 2015-12-02 DIAGNOSIS — M549 Dorsalgia, unspecified: Secondary | ICD-10-CM

## 2015-12-02 DIAGNOSIS — E279 Disorder of adrenal gland, unspecified: Secondary | ICD-10-CM | POA: Diagnosis not present

## 2015-12-02 DIAGNOSIS — R748 Abnormal levels of other serum enzymes: Secondary | ICD-10-CM | POA: Diagnosis present

## 2015-12-02 DIAGNOSIS — F17210 Nicotine dependence, cigarettes, uncomplicated: Secondary | ICD-10-CM

## 2015-12-02 DIAGNOSIS — J189 Pneumonia, unspecified organism: Secondary | ICD-10-CM

## 2015-12-02 DIAGNOSIS — M810 Age-related osteoporosis without current pathological fracture: Secondary | ICD-10-CM | POA: Diagnosis not present

## 2015-12-02 DIAGNOSIS — R509 Fever, unspecified: Secondary | ICD-10-CM | POA: Diagnosis not present

## 2015-12-02 DIAGNOSIS — I10 Essential (primary) hypertension: Secondary | ICD-10-CM

## 2015-12-02 DIAGNOSIS — E871 Hypo-osmolality and hyponatremia: Secondary | ICD-10-CM | POA: Diagnosis not present

## 2015-12-02 DIAGNOSIS — D72829 Elevated white blood cell count, unspecified: Secondary | ICD-10-CM | POA: Diagnosis not present

## 2015-12-02 DIAGNOSIS — R778 Other specified abnormalities of plasma proteins: Secondary | ICD-10-CM | POA: Diagnosis present

## 2015-12-02 LAB — COMPREHENSIVE METABOLIC PANEL
ALBUMIN: 3.5 g/dL (ref 3.5–5.0)
ALT: 9 U/L — ABNORMAL LOW (ref 14–54)
ANION GAP: 9 (ref 5–15)
AST: 11 U/L — AB (ref 15–41)
Alkaline Phosphatase: 60 U/L (ref 38–126)
BUN: 16 mg/dL (ref 6–20)
CHLORIDE: 94 mmol/L — AB (ref 101–111)
CO2: 26 mmol/L (ref 22–32)
Calcium: 9.1 mg/dL (ref 8.9–10.3)
Creatinine, Ser: 0.84 mg/dL (ref 0.44–1.00)
GFR calc Af Amer: >60 mL/min (ref >60)
GFR calc non Af Amer: >60 mL/min (ref >60)
GLUCOSE: 118 mg/dL — AB (ref 65–99)
POTASSIUM: 4 mmol/L (ref 3.5–5.1)
Sodium: 129 mmol/L — ABNORMAL LOW (ref 135–145)
Total Bilirubin: 0.9 mg/dL (ref 0.3–1.2)
Total Protein: 7.1 g/dL (ref 6.5–8.1)

## 2015-12-02 LAB — CBC
HCT: 35.5 % (ref 35.0–47.0)
HEMATOCRIT: 36.5 % (ref 35.0–47.0)
HEMOGLOBIN: 11.8 g/dL — AB (ref 12.0–16.0)
HEMOGLOBIN: 12.2 g/dL (ref 12.0–16.0)
MCH: 27.3 pg (ref 26.0–34.0)
MCH: 27.2 pg (ref 26.0–34.0)
MCHC: 33.2 g/dL (ref 32.0–36.0)
MCHC: 33.4 g/dL (ref 32.0–36.0)
MCV: 82.2 fL (ref 80.0–100.0)
MCV: 81.4 fL (ref 80.0–100.0)
PLATELETS: 209 10*3/uL (ref 150–440)
Platelets: 213 10*3/uL (ref 150–440)
RBC: 4.31 MIL/uL (ref 3.80–5.20)
RBC: 4.48 MIL/uL (ref 3.80–5.20)
RDW: 14.4 % (ref 11.5–14.5)
RDW: 14.5 % (ref 11.5–14.5)
WBC: 20.7 10*3/uL — ABNORMAL HIGH (ref 3.6–11.0)
WBC: 18.1 10*3/uL — ABNORMAL HIGH (ref 3.6–11.0)

## 2015-12-02 LAB — BASIC METABOLIC PANEL
ANION GAP: 9 (ref 5–15)
BUN: 18 mg/dL (ref 6–20)
CO2: 25 mmol/L (ref 22–32)
CREATININE: 0.84 mg/dL (ref 0.44–1.00)
Calcium: 9 mg/dL (ref 8.9–10.3)
Chloride: 96 mmol/L — ABNORMAL LOW (ref 101–111)
GLUCOSE: 125 mg/dL — AB (ref 65–99)
Potassium: 4.3 mmol/L (ref 3.5–5.1)
Sodium: 130 mmol/L — ABNORMAL LOW (ref 135–145)

## 2015-12-02 LAB — TROPONIN I: Troponin I: 0.09 ng/mL — ABNORMAL HIGH (ref <0.031)

## 2015-12-02 LAB — LACTIC ACID, PLASMA: LACTIC ACID, VENOUS: 1 mmol/L (ref 0.5–2.0)

## 2015-12-02 MED ORDER — METHYLPREDNISOLONE SODIUM SUCC 125 MG IJ SOLR
125.0000 mg | Freq: Once | INTRAMUSCULAR | Status: AC
  Administered 2015-12-02: 125 mg via INTRAVENOUS
  Filled 2015-12-02: qty 2

## 2015-12-02 MED ORDER — LEVOFLOXACIN IN D5W 750 MG/150ML IV SOLN
750.0000 mg | Freq: Once | INTRAVENOUS | Status: AC
  Administered 2015-12-02: 750 mg via INTRAVENOUS
  Filled 2015-12-02: qty 150

## 2015-12-02 MED ORDER — LEVOFLOXACIN 750 MG PO TABS: 750.0000 mg | ORAL_TABLET | Freq: Every day | ORAL | Status: DC

## 2015-12-02 MED ORDER — IPRATROPIUM-ALBUTEROL 0.5-2.5 (3) MG/3ML IN SOLN
3.0000 mL | Freq: Once | RESPIRATORY_TRACT | Status: AC
  Administered 2015-12-02: 3 mL via RESPIRATORY_TRACT
  Filled 2015-12-02: qty 3

## 2015-12-02 NOTE — ED Notes (Signed)
Pt reports she began feeling bad Wednesday with headache and cold sx's. Headache has went away but pt reports cough and back pain with sob. Reports feels similar to when she had pneumonia in the past.

## 2015-12-02 NOTE — ED Notes (Addendum)
Stood pt up and walked, denies any symptoms. BP 117/62 Pt to be D/C once Levaquin is done

## 2015-12-02 NOTE — ED Notes (Signed)
Patient transported to X-ray 

## 2015-12-02 NOTE — Discharge Instructions (Signed)
You're being treated for clinical pneumonia, suspected by your clinical exam, but with chest x-ray appearing clear today. You're given IV dose of Levaquin in the emergency department and being discharged with Levaquin prescription.  Return to the emergency department for any worsening condition including dizziness or passing out, fever, any worsening shortness of breath or trouble breathing.   Community-Acquired Pneumonia, Adult Pneumonia is an infection of the lungs. One type of pneumonia can happen while a person is in a hospital. A different type can happen when a person is not in a hospital (community-acquired pneumonia). It is easy for this kind to spread from person to person. It can spread to you if you breathe near an infected person who coughs or sneezes. Some symptoms include:  A dry cough.  A wet (productive) cough.  Fever.  Sweating.  Chest pain. HOME CARE  Take over-the-counter and prescription medicines only as told by your doctor.  Only take cough medicine if you are losing sleep.  If you were prescribed an antibiotic medicine, take it as told by your doctor. Do not stop taking the antibiotic even if you start to feel better.  Sleep with your head and neck raised (elevated). You can do this by putting a few pillows under your head, or you can sleep in a recliner.  Do not use tobacco products. These include cigarettes, chewing tobacco, and e-cigarettes. If you need help quitting, ask your doctor.  Drink enough water to keep your pee (urine) clear or pale yellow. A shot (vaccine) can help prevent pneumonia. Shots are often suggested for:  People older than 74 years of age.  People older than 74 years of age:  Who are having cancer treatment.  Who have long-term (chronic) lung disease.  Who have problems with their body's defense system (immune system). You may also prevent pneumonia if you take these actions:  Get the flu (influenza) shot every year.  Go to  the dentist as often as told.  Wash your hands often. If soap and water are not available, use hand sanitizer. GET HELP IF:  You have a fever.  You lose sleep because your cough medicine does not help. GET HELP RIGHT AWAY IF:  You are short of breath and it gets worse.  You have more chest pain.  Your sickness gets worse. This is very serious if:  You are an older adult.  Your body's defense system is weak.  You cough up blood.   This information is not intended to replace advice given to you by your health care provider. Make sure you discuss any questions you have with your health care provider.   Document Released: 04/22/2008 Document Revised: 07/26/2015 Document Reviewed: 03/01/2015 Elsevier Interactive Patient Education [employer].

## 2015-12-02 NOTE — ED Provider Notes (Signed)
[facility] Emergency Department Provider Note   ____________________________________________  Time seen:  I have reviewed the triage vital signs and the triage nursing note.  HISTORY  Chief Complaint Shortness of Breath; Back Pain; and Cough   Historian Patient  HPI Robin Hudson is a 75 y.o. female with a history of CHF and COPD who is here with a complaint of shortness of breath for couple of days and cough with productive sputum and left posterior back pain with coughing. She has oxygen available to wear if she needs it and for the last 3 nights she has used it at 2 L.  No fevers. She is concerned that she may have pneumonia. Symptoms are moderate. Negative diarrhea or abdominal pain.    Past Medical History  Diagnosis Date  . COPD (chronic obstructive pulmonary disease) ([place])   . Hypertension   . Asthma   . CHF (congestive heart failure) ([place])   . Hyperlipemia   . B12 deficiency   . Osteoporosis   . 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 Allergic rhinitis   . MRSA pneumonia ([place])   . Colon cancer ([place]) 2010  . Colon cancer ([place])   . Carcinoma of right breast treated with adjuvant chemotherapy ([place]) 1999    RT LUMPECTOMY  . Breast cancer ([place]) 1999    RT LUMPECTOMY  . Radiation 1999    BREAST CA    Patient Active Problem List   Diagnosis Date Noted  . Elevated antinuclear antibody (ANA) level 05/15/2015  . Pneumonia 05/15/2015  . SOB (shortness of breath) 05/12/2015  . Hyperlipemia 05/12/2015  . COPD (chronic obstructive pulmonary disease) ([place]) 05/12/2015  . HTN (hypertension) 05/12/2015  . Osteoporosis 05/12/2015  . 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 B12 deficiency 05/12/2015  . Allergic rhinitis 05/12/2015  . Breast cancer ([place]) 05/12/2015  . Colon cancer ([place]) 05/12/2015  . MRSA pneumonia ([place]) 05/12/2015  . Closed head injury 12/15/2014    Past Surgical History  Procedure Laterality Date  . Abdominal hysterectomy    .  Appendectomy    . Colectomy      partial  . Image guided sinus surgery      Current Outpatient Rx  Name  Route  Sig  Dispense  Refill  . ADVAIR DISKUS 250-50 MCG/DOSE AEPB   Oral   Take 1 puff by mouth 2 (two) times daily.      4     Dispense as written.   Marland Kitchen ALPRAZolam (XANAX) 0.5 MG tablet   Oral   Take 1 tablet by mouth 2 (two) times daily as needed.      3   . atenolol (TENORMIN) 50 MG tablet   Oral   Take 1 tablet (50 mg total) by mouth daily.   30 tablet   11   . Cyanocobalamin (VITAMIN B 12 PO)   Oral   Take 1 capsule by mouth daily.         Marland Kitchen doxycycline (VIBRA-TABS) 100 MG tablet   Oral   Take 1 tablet (100 mg total) by mouth every 12 (twelve) hours. X 7 days   14 tablet   0   . DULoxetine (CYMBALTA) 20 MG capsule   Oral   Take 1 capsule by mouth daily.      5   . escitalopram (LEXAPRO) 10 MG tablet   Oral   Take 1 tablet by mouth daily.      11   . HYDROcodone-acetaminophen ([place]) 10-325  MG per tablet   Oral   Take 1 tablet by mouth every 6 (six) hours as needed (Took half a tablet on wednesday.).       0   . levofloxacin (LEVAQUIN) 750 MG tablet   Oral   Take 1 tablet (750 mg total) by mouth daily.   5 tablet   0   . losartan (COZAAR) 50 MG tablet   Oral   Take 1 tablet (50 mg total) by mouth daily.   30 tablet   11   . montelukast (SINGULAIR) 10 MG tablet   Oral   Take 1 tablet by mouth daily.      3   . predniSONE (STERAPRED UNI-PAK 21 TAB) 10 MG (21) TBPK tablet      Start 4 po daily; decrease by 1 every 2 days   21 tablet   0   . PROAIR HFA 108 (90 BASE) MCG/ACT inhaler   Oral   Take 2 puffs by mouth every 6 (six) hours as needed.      12     Dispense as written.   Marland Kitchen SPIRIVA HANDIHALER 18 MCG inhalation capsule   Oral   Take 1 Inhaler by mouth daily.      11     Dispense as written.   Marland Kitchen VITAMIN D, CHOLECALCIFEROL, PO   Oral   Take 1 capsule by mouth daily.         . vitamin E 100 UNIT capsule    Oral   Take 100 Units by mouth daily.           Allergies Coreg  Family History  Problem Relation Age of Onset  . Heart attack Mother   . Breast cancer Sister 37  . Breast cancer Sister 51  . Breast cancer Other     Social History Social History  Substance Use Topics  . Smoking status: Current Every Day Smoker -- 0.25 packs/day    Types: Cigarettes  . Smokeless tobacco: Never Used  . Alcohol Use: No    Review of Systems  Constitutional: Negative for fever. Eyes: Negative for visual changes. ENT: Negative for sore throat. Cardiovascular: Negative for chest pain. Respiratory: Positive for shortness of breath. Gastrointestinal: Negative for abdominal pain, vomiting and diarrhea. Genitourinary: Negative for dysuria. Musculoskeletal: Negative for back pain. Skin: Negative for rash. Neurological: Negative for headache. 10 point Review of Systems otherwise negative ____________________________________________   PHYSICAL EXAM:  VITAL SIGNS: ED Triage Vitals  Enc Vitals Group     BP 12/02/15 1023 107/44 mmHg     Pulse Rate 12/02/15 1023 86     Resp 12/02/15 1023 20     Temp 12/02/15 1023 97.8 F (36.6 C)     Temp Source 12/02/15 1023 Oral     SpO2 12/02/15 1023 88 %     Weight 12/02/15 1023 130 lb (58.968 kg)     Height 12/02/15 1023 '5\' 5"'$  (1.651 m)     Head Cir --      Peak Flow --      Pain Score 12/02/15 1024 5     Pain Loc --      Pain Edu? --      Excl. in [place]? --      Constitutional: Alert and oriented. Well appearing and in no distress. Eyes: Conjunctivae are normal. PERRL. Normal extraocular movements. ENT   Head: Normocephalic and atraumatic.   Nose: No congestion/rhinnorhea.   Mouth/Throat: Mucous membranes are moist.   Neck: No  stridor. Cardiovascular/Chest: Normal rate, regular rhythm.  No murmurs, rubs, or gallops. Respiratory: Normal respiratory effort without tachypnea nor retractions. Moderate rhonchi especially left greater  than right posteriorly. No wheezing. Gastrointestinal: Soft. No distention, no guarding, no rebound. Nontender.    Genitourinary/rectal:Deferred Musculoskeletal: Nontender with normal range of motion in all extremities. No joint effusions.  No lower extremity tenderness.  No edema. Neurologic:  Normal speech and language. No gross or focal neurologic deficits are appreciated. Skin:  Skin is warm, dry and intact. No rash noted. Psychiatric: Mood and affect are normal. Speech and behavior are normal. Patient exhibits appropriate insight and judgment.  ____________________________________________   EKG I, Lisa Roca, MD, the attending physician have personally viewed and interpreted all ECGs.  84 bpm. Normal sinus rhythm. Narrow QRS. Normal axis. Normal ST and T-wave. ____________________________________________  LABS (pertinent positives/negatives)  White blood count 20.7, hemoglobin 77.4 Basic metabolic panel significant for sodium 130  ____________________________________________  RADIOLOGY All Xrays were viewed by me. Imaging interpreted by Radiologist.  Chest 2 view: No active cardiopulmonary disease __________________________________________  PROCEDURES  Procedure(s) performed: None  Critical Care performed: None  ____________________________________________   ED COURSE / ASSESSMENT AND PLAN  CONSULTATIONS: None  Pertinent labs & imaging results that were available during my care of the patient were reviewed by me and considered in my medical decision making (see chart for details).   Patient's symptoms sound like she may have a pneumonia with asymmetric lung sounds,, as well as elevated white blood cell count and productive cough with sputum.  Chest x-ray was clear. She does have oxygen available at home and O2 sat here on 2 L is 97%. Patient would really like to go home.   I think this is reasonable she does seem stable. There's been no tachycardia or  hypotension. No altered mental status. She was given her first dose of Levaquin here. She'll be discharged with Levaquin prescription.  I'm not suspicious of sepsis.   Patient / Family / Caregiver informed of clinical course, medical decision-making process, and agree with plan.   I discussed return precautions, follow-up instructions, and discharged instructions with patient and/or family.  ___________________________________________   FINAL CLINICAL IMPRESSION(S) / ED DIAGNOSES   Final diagnoses:  Community acquired pneumonia              Note: This dictation was prepared with Sales executive. Any transcriptional errors that result from this process are unintentional   Lisa Roca, MD 12/02/15 1133

## 2015-12-02 NOTE — ED Notes (Signed)
Pt states she does have oxygen as needed that she wears at night 2L if needed. She did wear it last night and this morning. History of COPD.

## 2015-12-02 NOTE — ED Notes (Signed)
Pt reports that she left here AMA around 4:00pm today and has been SOB ever since that time. Pt is a/o at this time.

## 2015-12-02 NOTE — ED Provider Notes (Signed)
[facility] Emergency Department Provider Note  ____________________________________________  Time seen: On arrival, via EMS  I have reviewed the triage vital signs and the nursing notes.   HISTORY  Chief Complaint Shortness of Breath    HPI Robin Hudson is a 75 y.o. female who presents with complaints of shortness of breath. Patient notes she was seen here earlier today and was adamant that she wanted to go home and was discharged. Earlier physician thought she may have a pneumonia. Patient also has a history of CHF and COPD. She does continue to smoke. She reports chest tightness. EMS gave two nebs in route. Room air pulse ox reported 87%. She continues to have cough     Past Medical History  Diagnosis Date  . COPD (chronic obstructive pulmonary disease) ([employer])   . Hypertension   . Asthma   . CHF (congestive heart failure) ([place])   . Hyperlipemia   . B12 deficiency   . Osteoporosis   . 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 Allergic rhinitis   . MRSA pneumonia ([place])   . Colon cancer ([place]) 2010  . Colon cancer ([place])   . Carcinoma of right breast treated with adjuvant chemotherapy ([place]) 1999    RT LUMPECTOMY  . Breast cancer ([place]) 1999    RT LUMPECTOMY  . Radiation 1999    BREAST CA    Patient Active Problem List   Diagnosis Date Noted  . Elevated antinuclear antibody (ANA) level 05/15/2015  . Pneumonia 05/15/2015  . SOB (shortness of breath) 05/12/2015  . Hyperlipemia 05/12/2015  . COPD (chronic obstructive pulmonary disease) ([place]) 05/12/2015  . HTN (hypertension) 05/12/2015  . Osteoporosis 05/12/2015  . 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 B12 deficiency 05/12/2015  . Allergic rhinitis 05/12/2015  . Breast cancer ([place]) 05/12/2015  . Colon cancer ([place]) 05/12/2015  . MRSA pneumonia ([place]) 05/12/2015  . Closed head injury 12/15/2014    Past Surgical History  Procedure Laterality Date  . Abdominal hysterectomy    . Appendectomy    .  Colectomy      partial  . Image guided sinus surgery      Current Outpatient Rx  Name  Route  Sig  Dispense  Refill  . ADVAIR DISKUS 250-50 MCG/DOSE AEPB   Oral   Take 1 puff by mouth 2 (two) times daily.      4     Dispense as written.   Marland Kitchen ALPRAZolam (XANAX) 0.5 MG tablet   Oral   Take 1 tablet by mouth 2 (two) times daily as needed.      3   . atenolol (TENORMIN) 50 MG tablet   Oral   Take 1 tablet (50 mg total) by mouth daily.   30 tablet   11   . Cyanocobalamin (VITAMIN B 12 PO)   Oral   Take 1 capsule by mouth daily.         Marland Kitchen doxycycline (VIBRA-TABS) 100 MG tablet   Oral   Take 1 tablet (100 mg total) by mouth every 12 (twelve) hours. X 7 days   14 tablet   0   . DULoxetine (CYMBALTA) 20 MG capsule   Oral   Take 1 capsule by mouth daily.      5   . escitalopram (LEXAPRO) 10 MG tablet   Oral   Take 1 tablet by mouth daily.      11   . HYDROcodone-acetaminophen ([place]) 10-325 MG per tablet   Oral  Take 1 tablet by mouth every 6 (six) hours as needed (Took half a tablet on wednesday.).       0   . levofloxacin (LEVAQUIN) 750 MG tablet   Oral   Take 1 tablet (750 mg total) by mouth daily.   5 tablet   0   . losartan (COZAAR) 50 MG tablet   Oral   Take 1 tablet (50 mg total) by mouth daily.   30 tablet   11   . montelukast (SINGULAIR) 10 MG tablet   Oral   Take 1 tablet by mouth daily.      3   . predniSONE (STERAPRED UNI-PAK 21 TAB) 10 MG (21) TBPK tablet      Start 4 po daily; decrease by 1 every 2 days   21 tablet   0   . PROAIR HFA 108 (90 BASE) MCG/ACT inhaler   Oral   Take 2 puffs by mouth every 6 (six) hours as needed.      12     Dispense as written.   Marland Kitchen SPIRIVA HANDIHALER 18 MCG inhalation capsule   Oral   Take 1 Inhaler by mouth daily.      11     Dispense as written.   Marland Kitchen VITAMIN D, CHOLECALCIFEROL, PO   Oral   Take 1 capsule by mouth daily.         . vitamin E 100 UNIT capsule   Oral   Take 100 Units  by mouth daily.           Allergies Coreg  Family History  Problem Relation Age of Onset  . Heart attack Mother   . Breast cancer Sister 24  . Breast cancer Sister 48  . Breast cancer Other     Social History Social History  Substance Use Topics  . Smoking status: Current Every Day Smoker -- 0.25 packs/day    Types: Cigarettes  . Smokeless tobacco: Never Used  . Alcohol Use: No    Review of Systems  Constitutional: Negative for fever. Eyes: Negative for visual changes. ENT: Negative for difficulty swallowing Cardiovascular: Positive for chest tightness Respiratory: Positive for shortness of breath. Positive for cough Gastrointestinal: Negative for abdominal pain, vomiting and diarrhea. Genitourinary: Negative for dysuria. Musculoskeletal: Negative for back pain. Skin: Negative for rash. Neurological: Negative for headaches Psychiatric: Positive for anxiety    ____________________________________________   PHYSICAL EXAM:  VITAL SIGNS: ED Triage Vitals  Enc Vitals Group     BP 12/02/15 2303 124/74 mmHg     Pulse Rate 12/02/15 2303 123     Resp 12/02/15 2303 27     Temp 12/02/15 2303 98.8 F (37.1 C)     Temp src --      SpO2 12/02/15 2303 89 %     Weight 12/02/15 2303 130 lb (58.968 kg)     Height 12/02/15 2303 '5\' 4"'$  (1.626 m)     Head Cir --      Peak Flow --      Pain Score --      Pain Loc --      Pain Edu? --      Excl. in [place]? --     Constitutional: Alert and oriented. Ill-appearing and in mild to moderate distress Eyes: Conjunctivae are normal.  ENT   Head: Normocephalic and atraumatic.   Mouth/Throat: Mucous membranes are moist. Cardiovascular: Tachycardia, regular rhythm. Normal and symmetric distal pulses are present in all extremities. No murmurs, rubs, or gallops.  Respiratory: Positive tachypnea. Wheezing bilaterally  Gastrointestinal: Soft and non-tender in all quadrants. No distention. There is no CVA  tenderness. Genitourinary: deferred Musculoskeletal: Nontender with normal range of motion in all extremities. No lower extremity tenderness nor edema. Neurologic:  Normal speech and language. No gross focal neurologic deficits are appreciated. Skin:  Skin is warm, dry and intact. No rash noted. Psychiatric: Mood and affect are normal albeit anxious appropriately. Patient exhibits appropriate insight and judgment.  ____________________________________________    LABS (pertinent positives/negatives)  Labs Reviewed  CULTURE, BLOOD (ROUTINE X 2)  CULTURE, BLOOD (ROUTINE X 2)  LACTIC ACID, PLASMA  LACTIC ACID, PLASMA  TROPONIN I  COMPREHENSIVE METABOLIC PANEL  CBC    ____________________________________________   EKG  ED ECG REPORT I, Lavonia Drafts, the attending physician, personally viewed and interpreted this ECG.   Date: 12/02/2015  EKG Time: 11:11 PM  Rate: 119  Rhythm: Sinus tachycardia  Axis: Normal  Intervals:none  ST&T Change: Nonspecific   ____________________________________________    RADIOLOGY I have personally reviewed any xrays that were ordered on this patient: Chest x-ray shows no pneumonia  ____________________________________________   PROCEDURES  Procedure(s) performed: none  Critical Care performed: yes  CRITICAL CARE Performed by: Lavonia Drafts   Total critical care time: 30 minutes  Critical care time was exclusive of separately billable procedures and treating other patients.  Critical care was necessary to treat or prevent imminent or life-threatening deterioration.  Critical care was time spent personally by me on the following activities: development of treatment plan with patient and/or surrogate as well as nursing, discussions with consultants, evaluation of patient's response to treatment, examination of patient, obtaining history from patient or surrogate, ordering and performing treatments and interventions, ordering and  review of laboratory studies, ordering and review of radiographic studies, pulse oximetry and re-evaluation of patient's condition.   ____________________________________________   INITIAL IMPRESSION / ASSESSMENT AND PLAN / ED COURSE  Pertinent labs & imaging results that were available during my care of the patient were reviewed by me and considered in my medical decision making (see chart for details).  Patient returns with worsening shortness of breath. Earlier today she did have an elevated white blood cell count and Dr. Reita Cliche was suspicious for pneumonia. Differential diagnosis includes COPD exacerbation secondary to viral illness, community-acquired pneumonia, CHF exacerbation. I will give an additional DuoNeb here and add solu Medrol IV as her wheezing is significant and likely the cause of her distress. We will repeat labs given mild hyponatremia earlier today and check lactate and add BCx2.   ----------------------------------------- 12:48 AM on 12/03/2015 -----------------------------------------  No pneumonia on chest x-ray, continued elevated white blood cell count, sodium is 129. Patient with improvement after nebulizers and slight Medrol. Given cough, elevated white blood cell count, hypoxia I will give antibiotics and admitted to the hospital  ____________________________________________   FINAL CLINICAL IMPRESSION(S) / ED DIAGNOSES  Final diagnoses:  Acute respiratory failure with hypoxia ([place])     Lavonia Drafts, MD 12/03/15 [phone]

## 2015-12-03 DIAGNOSIS — I110 Hypertensive heart disease with heart failure: Secondary | ICD-10-CM | POA: Diagnosis not present

## 2015-12-03 DIAGNOSIS — E871 Hypo-osmolality and hyponatremia: Secondary | ICD-10-CM | POA: Diagnosis not present

## 2015-12-03 DIAGNOSIS — E785 Hyperlipidemia, unspecified: Secondary | ICD-10-CM | POA: Diagnosis not present

## 2015-12-03 DIAGNOSIS — E538 Deficiency of other specified B group vitamins: Secondary | ICD-10-CM | POA: Diagnosis not present

## 2015-12-03 DIAGNOSIS — I509 Heart failure, unspecified: Secondary | ICD-10-CM | POA: Diagnosis not present

## 2015-12-03 DIAGNOSIS — J45909 Unspecified asthma, uncomplicated: Secondary | ICD-10-CM | POA: Diagnosis not present

## 2015-12-03 DIAGNOSIS — J441 Chronic obstructive pulmonary disease with (acute) exacerbation: Secondary | ICD-10-CM | POA: Diagnosis not present

## 2015-12-03 DIAGNOSIS — R7989 Other specified abnormal findings of blood chemistry: Secondary | ICD-10-CM

## 2015-12-03 DIAGNOSIS — E279 Disorder of adrenal gland, unspecified: Secondary | ICD-10-CM | POA: Diagnosis not present

## 2015-12-03 DIAGNOSIS — D72829 Elevated white blood cell count, unspecified: Secondary | ICD-10-CM | POA: Diagnosis not present

## 2015-12-03 DIAGNOSIS — Z7952 Long term (current) use of systemic steroids: Secondary | ICD-10-CM | POA: Diagnosis not present

## 2015-12-03 DIAGNOSIS — R748 Abnormal levels of other serum enzymes: Secondary | ICD-10-CM | POA: Diagnosis not present

## 2015-12-03 DIAGNOSIS — M810 Age-related osteoporosis without current pathological fracture: Secondary | ICD-10-CM | POA: Diagnosis not present

## 2015-12-03 DIAGNOSIS — Z853 Personal history of malignant neoplasm of breast: Secondary | ICD-10-CM | POA: Diagnosis not present

## 2015-12-03 DIAGNOSIS — Z85038 Personal history of other malignant neoplasm of large intestine: Secondary | ICD-10-CM | POA: Diagnosis not present

## 2015-12-03 DIAGNOSIS — J9621 Acute and chronic respiratory failure with hypoxia: Secondary | ICD-10-CM | POA: Diagnosis not present

## 2015-12-03 DIAGNOSIS — Z888 Allergy status to other drugs, medicaments and biological substances status: Secondary | ICD-10-CM | POA: Diagnosis not present

## 2015-12-03 DIAGNOSIS — Z79899 Other long term (current) drug therapy: Secondary | ICD-10-CM | POA: Diagnosis not present

## 2015-12-03 DIAGNOSIS — R778 Other specified abnormalities of plasma proteins: Secondary | ICD-10-CM | POA: Diagnosis present

## 2015-12-03 DIAGNOSIS — J9601 Acute respiratory failure with hypoxia: Secondary | ICD-10-CM | POA: Diagnosis not present

## 2015-12-03 DIAGNOSIS — Z79891 Long term (current) use of opiate analgesic: Secondary | ICD-10-CM | POA: Diagnosis not present

## 2015-12-03 DIAGNOSIS — F17210 Nicotine dependence, cigarettes, uncomplicated: Secondary | ICD-10-CM | POA: Diagnosis not present

## 2015-12-03 DIAGNOSIS — I10 Essential (primary) hypertension: Secondary | ICD-10-CM | POA: Diagnosis not present

## 2015-12-03 LAB — TROPONIN I
TROPONIN I: 0.06 ng/mL — AB (ref <0.031)
TROPONIN I: 0.03 ng/mL (ref <0.031)
Troponin I: 0.11 ng/mL — ABNORMAL HIGH (ref <0.031)

## 2015-12-03 LAB — CBC
HEMATOCRIT: 33 % — AB (ref 35.0–47.0)
Hemoglobin: 10.8 g/dL — ABNORMAL LOW (ref 12.0–16.0)
MCH: 26.9 pg (ref 26.0–34.0)
MCHC: 32.7 g/dL (ref 32.0–36.0)
MCV: 82.4 fL (ref 80.0–100.0)
PLATELETS: 186 10*3/uL (ref 150–440)
RBC: 4.01 MIL/uL (ref 3.80–5.20)
RDW: 13.6 % (ref 11.5–14.5)
WBC: 13.3 10*3/uL — AB (ref 3.6–11.0)

## 2015-12-03 LAB — BASIC METABOLIC PANEL
Anion gap: 5 (ref 5–15)
BUN: 14 mg/dL (ref 6–20)
CHLORIDE: 99 mmol/L — AB (ref 101–111)
CO2: 26 mmol/L (ref 22–32)
CREATININE: 0.69 mg/dL (ref 0.44–1.00)
Calcium: 8.3 mg/dL — ABNORMAL LOW (ref 8.9–10.3)
GFR calc Af Amer: >60 mL/min (ref >60)
GFR calc non Af Amer: >60 mL/min (ref >60)
Glucose, Bld: 140 mg/dL — ABNORMAL HIGH (ref 65–99)
POTASSIUM: 3.9 mmol/L (ref 3.5–5.1)
Sodium: 130 mmol/L — ABNORMAL LOW (ref 135–145)

## 2015-12-03 LAB — OSMOLALITY, URINE: Osmolality, Ur: 253 mOsm/kg — ABNORMAL LOW (ref 300–900)

## 2015-12-03 LAB — SODIUM, URINE, RANDOM

## 2015-12-03 LAB — LACTIC ACID, PLASMA: Lactic Acid, Venous: 0.8 mmol/L (ref 0.5–2.0)

## 2015-12-03 MED ORDER — IPRATROPIUM-ALBUTEROL 0.5-2.5 (3) MG/3ML IN SOLN
3.0000 mL | RESPIRATORY_TRACT | Status: DC | PRN
  Administered 2015-12-04: 03:00:00 3 mL via RESPIRATORY_TRACT
  Filled 2015-12-03: qty 3

## 2015-12-03 MED ORDER — HEPARIN SODIUM (PORCINE) 5000 UNIT/ML IJ SOLN
5000.0000 [IU] | Freq: Three times a day (TID) | INTRAMUSCULAR | Status: DC
  Administered 2015-12-03 – 2015-12-04 (×4): 5000 [IU] via SUBCUTANEOUS
  Filled 2015-12-03 (×5): qty 1

## 2015-12-03 MED ORDER — MORPHINE SULFATE (PF) 2 MG/ML IV SOLN: 2.0000 mg | INTRAVENOUS | Status: DC | PRN

## 2015-12-03 MED ORDER — ATENOLOL 25 MG PO TABS
50.0000 mg | ORAL_TABLET | Freq: Every day | ORAL | Status: DC
  Administered 2015-12-04: 10:00:00 50 mg via ORAL
  Filled 2015-12-03: qty 2

## 2015-12-03 MED ORDER — SODIUM CHLORIDE 0.9 % IJ SOLN
3.0000 mL | Freq: Two times a day (BID) | INTRAMUSCULAR | Status: DC
  Administered 2015-12-03 (×3): 3 mL via INTRAVENOUS

## 2015-12-03 MED ORDER — ACETAMINOPHEN 325 MG PO TABS: 650.0000 mg | ORAL_TABLET | Freq: Four times a day (QID) | ORAL | Status: DC | PRN

## 2015-12-03 MED ORDER — VITAMIN B-12 100 MCG PO TABS: ORAL_TABLET | Freq: Every day | ORAL | Status: DC

## 2015-12-03 MED ORDER — MOMETASONE FURO-FORMOTEROL FUM 100-5 MCG/ACT IN AERO
2.0000 | INHALATION_SPRAY | Freq: Two times a day (BID) | RESPIRATORY_TRACT | Status: DC
  Administered 2015-12-03 – 2015-12-04 (×3): 2 via RESPIRATORY_TRACT
  Filled 2015-12-03: qty 8.8

## 2015-12-03 MED ORDER — OXYCODONE HCL 5 MG PO TABS
5.0000 mg | ORAL_TABLET | ORAL | Status: DC | PRN
  Administered 2015-12-03 – 2015-12-04 (×5): 5 mg via ORAL
  Filled 2015-12-03 (×5): qty 1

## 2015-12-03 MED ORDER — ESCITALOPRAM OXALATE 10 MG PO TABS
10.0000 mg | ORAL_TABLET | Freq: Every day | ORAL | Status: DC
Start: 2015-12-03 — End: 2015-12-04
  Administered 2015-12-03 – 2015-12-04 (×2): 10 mg via ORAL
  Filled 2015-12-03 (×2): qty 1

## 2015-12-03 MED ORDER — METHYLPREDNISOLONE SODIUM SUCC 40 MG IJ SOLR
40.0000 mg | Freq: Two times a day (BID) | INTRAMUSCULAR | Status: DC
  Administered 2015-12-03 – 2015-12-04 (×2): 40 mg via INTRAVENOUS
  Filled 2015-12-03 (×2): qty 1

## 2015-12-03 MED ORDER — LEVOFLOXACIN IN D5W 500 MG/100ML IV SOLN: 500.0000 mg | INTRAVENOUS | Status: DC

## 2015-12-03 MED ORDER — DULOXETINE HCL 20 MG PO CPEP
20.0000 mg | ORAL_CAPSULE | Freq: Every day | ORAL | Status: DC
  Administered 2015-12-03 – 2015-12-04 (×2): 20 mg via ORAL
  Filled 2015-12-03 (×2): qty 1

## 2015-12-03 MED ORDER — VITAMIN B-12 1000 MCG PO TABS
1000.0000 ug | ORAL_TABLET | Freq: Every day | ORAL | Status: DC
  Administered 2015-12-03 – 2015-12-04 (×2): 1000 ug via ORAL
  Filled 2015-12-03 (×2): qty 1

## 2015-12-03 MED ORDER — LEVOFLOXACIN IN D5W 500 MG/100ML IV SOLN
500.0000 mg | Freq: Once | INTRAVENOUS | Status: AC
  Administered 2015-12-03: 500 mg via INTRAVENOUS
  Filled 2015-12-03: qty 100

## 2015-12-03 MED ORDER — TIOTROPIUM BROMIDE MONOHYDRATE 18 MCG IN CAPS
18.0000 ug | ORAL_CAPSULE | Freq: Every day | RESPIRATORY_TRACT | Status: DC
  Administered 2015-12-03 – 2015-12-04 (×2): 18 ug via RESPIRATORY_TRACT
  Filled 2015-12-03: qty 5

## 2015-12-03 MED ORDER — METHYLPREDNISOLONE SODIUM SUCC 125 MG IJ SOLR: 60.0000 mg | INTRAMUSCULAR | Status: DC

## 2015-12-03 MED ORDER — ONDANSETRON HCL 4 MG/2ML IJ SOLN: 4.0000 mg | Freq: Four times a day (QID) | INTRAMUSCULAR | Status: DC | PRN

## 2015-12-03 MED ORDER — ACETAMINOPHEN 650 MG RE SUPP: 650.0000 mg | Freq: Four times a day (QID) | RECTAL | Status: DC | PRN

## 2015-12-03 MED ORDER — ONDANSETRON HCL 4 MG PO TABS: 4.0000 mg | ORAL_TABLET | Freq: Four times a day (QID) | ORAL | Status: DC | PRN

## 2015-12-03 MED ORDER — BUDESONIDE-FORMOTEROL FUMARATE 160-4.5 MCG/ACT IN AERO: 2.0000 | INHALATION_SPRAY | Freq: Two times a day (BID) | RESPIRATORY_TRACT | Status: DC

## 2015-12-03 MED ORDER — HYDROXYCHLOROQUINE SULFATE 200 MG PO TABS
400.0000 mg | ORAL_TABLET | Freq: Every day | ORAL | Status: DC
  Administered 2015-12-03 – 2015-12-04 (×2): 400 mg via ORAL
  Filled 2015-12-03 (×2): qty 2

## 2015-12-03 MED ORDER — LEVOFLOXACIN IN D5W 500 MG/100ML IV SOLN
500.0000 mg | INTRAVENOUS | Status: DC
  Filled 2015-12-03: qty 100

## 2015-12-03 MED ORDER — LOSARTAN POTASSIUM 50 MG PO TABS
50.0000 mg | ORAL_TABLET | Freq: Every day | ORAL | Status: DC
  Administered 2015-12-04: 10:00:00 50 mg via ORAL
  Filled 2015-12-03 (×2): qty 1

## 2015-12-03 MED ORDER — SODIUM CHLORIDE 0.9 % IV SOLN
INTRAVENOUS | Status: DC
  Administered 2015-12-03 – 2015-12-04 (×3): via INTRAVENOUS

## 2015-12-03 MED ORDER — MONTELUKAST SODIUM 10 MG PO TABS
10.0000 mg | ORAL_TABLET | Freq: Every day | ORAL | Status: DC
  Administered 2015-12-03 – 2015-12-04 (×2): 10 mg via ORAL
  Filled 2015-12-03 (×2): qty 1

## 2015-12-03 MED ORDER — ALPRAZOLAM 0.5 MG PO TABS
0.5000 mg | ORAL_TABLET | Freq: Two times a day (BID) | ORAL | Status: DC | PRN
  Administered 2015-12-03: 0.5 mg via ORAL
  Filled 2015-12-03: qty 1

## 2015-12-03 MED ORDER — TIZANIDINE HCL 2 MG PO TABS
1.0000 mg | ORAL_TABLET | Freq: Every day | ORAL | Status: DC | PRN
  Filled 2015-12-03: qty 1

## 2015-12-03 NOTE — Progress Notes (Addendum)
[place] [facility] [place] NAME: Robin Hudson    MR#:  [id number]  DATE OF BIRTH:  12-23-1940  SUBJECTIVE:  CHIEF COMPLAINT:  Pts sob is better while resting. Denies any complaints. Husband at bedside.  REVIEW OF SYSTEMS:  CONSTITUTIONAL: No fever, fatigue or weakness.  EYES: No blurred or double vision.  EARS, NOSE, AND THROAT: No tinnitus or ear pain.  RESPIRATORY: No cough, with exertion and feels short of breath, wheezing or hemoptysis.  CARDIOVASCULAR: No chest pain, orthopnea, edema.  GASTROINTESTINAL: No nausea, vomiting, diarrhea or abdominal pain.  GENITOURINARY: No dysuria, hematuria.  ENDOCRINE: No polyuria, nocturia,  HEMATOLOGY: No anemia, easy bruising or bleeding SKIN: No rash or lesion. MUSCULOSKELETAL: No joint pain or arthritis.   NEUROLOGIC: No tingling, numbness, weakness.  PSYCHIATRY: No anxiety or depression.   DRUG ALLERGIES:   Allergies  Allergen Reactions  . Coreg [Carvedilol] Other (See Comments)    syncope    VITALS:  Blood pressure 109/47, pulse 82, temperature 98.2 F (36.8 C), temperature source Oral, resp. rate 20, height '5\' 4"'$  (1.626 m), weight 59.149 kg (130 lb 6.4 oz), SpO2 97 %.  PHYSICAL EXAMINATION:  GENERAL:  75 y.o.-year-old patient lying in the bed with no acute distress.  EYES: Pupils equal, round, reactive to light and accommodation. No scleral icterus. Extraocular muscles intact.  HEENT: Head atraumatic, normocephalic. Oropharynx and nasopharynx clear.  NECK:  Supple, no jugular venous distention. No thyroid enlargement, no tenderness.  LUNGS: Normal breath sounds bilaterally, end expiratory wheezing, rales,rhonchi or crepitation. No use of accessory muscles of respiration.  CARDIOVASCULAR: S1, S2 normal. No murmurs, rubs, or gallops.  ABDOMEN: Soft, nontender, nondistended. Bowel sounds present. No organomegaly or mass.  EXTREMITIES: No pedal edema, cyanosis, or clubbing.  NEUROLOGIC:  Cranial nerves II through XII are intact. Muscle strength 5/5 in all extremities. Sensation intact. Gait not checked.  PSYCHIATRIC: The patient is alert and oriented x 3.  SKIN: No obvious rash, lesion, or ulcer.    LABORATORY PANEL:   CBC  Recent Labs Lab 12/03/15 0237  WBC 13.3*  HGB 10.8*  HCT 33.0*  PLT 186   ------------------------------------------------------------------------------------------------------------------  Chemistries   Recent Labs Lab 12/02/15 2322 12/03/15 0237  NA 129* 130*  K 4.0 3.9  CL 94* 99*  CO2 26 26  GLUCOSE 118* 140*  BUN 16 14  CREATININE 0.84 0.69  CALCIUM 9.1 8.3*  AST 11*  --   ALT 9*  --   ALKPHOS 60  --   BILITOT 0.9  --    ------------------------------------------------------------------------------------------------------------------  Cardiac Enzymes  Recent Labs Lab 12/03/15 0800  TROPONINI 0.06*   ------------------------------------------------------------------------------------------------------------------  RADIOLOGY:  Dg Chest 2 View  12/02/2015  CLINICAL DATA:  Acute onset of cough and fever. Poor appetite. Initial encounter. EXAM: CHEST  2 VIEW COMPARISON:  Chest radiograph performed earlier today at 10:36 a.m., and CTA of the chest performed 09/23/2015 FINDINGS: Chronic biapical scarring, more prominent on the right, is similar in appearance to prior studies. Peribronchial thickening is noted. No definite pleural effusion or pneumothorax is seen. The heart is normal in size. No acute osseous abnormalities are identified. IMPRESSION: No definite acute airspace consolidation seen. Biapical scarring again noted, more prominent on the right. Electronically Signed   By: Garald Balding M.D.   On: 12/02/2015 23:52   Dg Chest 2 View  12/02/2015  CLINICAL DATA:  Short of breath. Productive cough. Symptoms for 3 day. EXAM: CHEST  2 VIEW  COMPARISON:  09/23/2015 FINDINGS: Bilateral heterogeneous opacities are stable.  Bilateral apical pleural thickening right greater than left is stable. Normal heart size. No pneumothorax. Hyperaeration. IMPRESSION: No active cardiopulmonary disease. Electronically Signed   By: Marybelle Killings M.D.   On: 12/02/2015 10:50    EKG:   Orders placed or performed during the hospital encounter of 12/02/15  . ED EKG  . ED EKG  . EKG 12-Lead  . EKG 12-Lead    ASSESSMENT AND PLAN:   75 year old Caucasian female history of COPD non-oxygen dependent presenting with shortness of breath  1.Chronic obstructive pulmonary disease exacerbation:  Provide DuoNeb treatments q. 4 hours,  Solu-Medrol 60 mg IV , will continue Levaquin as started in emergency department. Continue with home medications . She does have leukocytosis however has recently finished a steroid taper. We'll check laboratory pulse ox  2. Hyponatremia:  Check urine sodium, osmolality, gentle IV fluid hydration  follow sodium level 129-1:30  3. Elevated troponin: Place on telemetry  No trending cardiac enzymes-0.11-0.06  4. Essential hypertension: Atenolol  5. Venous thromboembolism prophylactic: Heparin subcutaneous     All the records are reviewed and case discussed with Care Management/Social Workerr. Management plans discussed with the patient, family and they are in agreement.  CODE STATUS: Full code  TOTAL TIME TAKING CARE OF THIS PATIENT: 35 minutes.   POSSIBLE D/C IN  1-2 DAYS, DEPENDING ON CLINICAL CONDITION.   Nicholes Mango M.D on 12/03/2015 at 1:22 PM  Between 7am to 6pm - Pager - [phone] After 6pm go to [email or web address] - password [email or web address]  [facility]  Office  [phone]  CC: Primary care physician; Adin Hector, MD

## 2015-12-03 NOTE — Plan of Care (Signed)
Problem: Education: Goal: Knowledge of [facility] Education information/materials will improve Outcome: Progressing Pt likes to be called Robin Hudson  Pt lives at home with husband and one of her sons.    Past Medical History   Diagnosis  Date   .  COPD (chronic obstructive pulmonary disease) ([place])     .  Hypertension     .  Asthma     .  CHF (congestive heart failure) ([place])     .  Hyperlipemia     .  B12 deficiency     .  Osteoporosis     .  Right adrenal mass ([place])     .  Allergic rhinitis     .  MRSA pneumonia ([place])     .  Colon cancer ([place])  2010   .  Colon cancer ([place])     .  Carcinoma of right breast treated with adjuvant chemotherapy ([place])  1999       RT LUMPECTOMY   .  Breast cancer ([place])  1999       RT LUMPECTOMY   .  Radiation  1999       BREAST CA             Pt is well controlled with home medications.     Problem: Activity: Goal: Risk for activity intolerance will decrease Outcome: Progressing No c/o pain nor respiratory distress noted. On iv fluids. Iv solu-medrol. Up to the bathroom with one assist. Continue to monitor.

## 2015-12-03 NOTE — H&P (Signed)
[facility] [place] NAME: Robin Hudson    MR#:  [id number]  DATE OF BIRTH:  [birth year]   DATE OF ADMISSION:  12/02/2015  PRIMARY CARE PHYSICIAN: Adin Hector, MD   REQUESTING/REFERRING PHYSICIAN: [place]  CHIEF COMPLAINT:   Chief Complaint  Patient presents with  . Shortness of Breath    HISTORY OF PRESENT ILLNESS:  Robin Hudson  is a 75 y.o. female with a known history of COPD and non-oxygen dependent presenting with shortness of breath. She describes 2-3 day duration of shortness of breath, mainly dyspnea on exertion with associated wheezing, cough productive of greenish to yellow sputum denies fevers or chills chest pain further symptomatology. Actually evaluated in the emergency department one day ago for similar symptoms she felt well enough to leave the hospital after some breathing treatments however condition worsened over the course of the day thus came back to the hospital. On arrival she is desaturating into the high 80s on room air  PAST MEDICAL HISTORY:   Past Medical History  Diagnosis Date  . COPD (chronic obstructive pulmonary disease) ([place])   . Hypertension   . Asthma   . CHF (congestive heart failure) ([place])   . Hyperlipemia   . B12 deficiency   . Osteoporosis   . Right adrenal mass ([place])   . Allergic rhinitis   . MRSA pneumonia ([place])   . Colon cancer ([place]) 2010  . Colon cancer ([place])   . Carcinoma of right breast treated with adjuvant chemotherapy ([place]) 1999    RT LUMPECTOMY  . Breast cancer ([place]) 1999    RT LUMPECTOMY  . Radiation 1999    BREAST CA    PAST SURGICAL HISTORY:   Past Surgical History  Procedure Laterality Date  . Abdominal hysterectomy    . Appendectomy    . Colectomy      partial  . Image guided sinus surgery      SOCIAL HISTORY:   Social History  Substance Use Topics  . Smoking status: Current Every Day Smoker -- 0.25 packs/day    Types: Cigarettes  . Smokeless  tobacco: Never Used  . Alcohol Use: No    FAMILY HISTORY:   Family History  Problem Relation Age of Onset  . Heart attack Mother   . Breast cancer Sister 32  . Breast cancer Sister 60  . Breast cancer Other     DRUG ALLERGIES:   Allergies  Allergen Reactions  . Coreg [Carvedilol] Other (See Comments)    syncope    REVIEW OF SYSTEMS:  REVIEW OF SYSTEMS:  CONSTITUTIONAL: Denies fevers, chills, positive fatigue, weakness.  EYES: Denies blurred vision, double vision, or eye pain.  EARS, NOSE, THROAT: Denies tinnitus, ear pain, hearing loss.  RESPIRATORY: Positive cough, shortness of breath, wheezing  CARDIOVASCULAR: Denies chest pain, palpitations, edema.  GASTROINTESTINAL: Denies nausea, vomiting, diarrhea, abdominal pain.  GENITOURINARY: Denies dysuria, hematuria.  ENDOCRINE: Denies nocturia or thyroid problems. HEMATOLOGIC AND LYMPHATIC: Denies easy bruising or bleeding.  SKIN: Denies rash or lesions.  MUSCULOSKELETAL: Denies pain in neck, back, shoulder, knees, hips, or further arthritic symptoms.  NEUROLOGIC: Denies paralysis, paresthesias.  PSYCHIATRIC: Denies anxiety or depressive symptoms. Otherwise full review of systems performed by me is negative.   MEDICATIONS AT HOME:   Prior to Admission medications   Medication Sig Start Date End Date Taking? Authorizing Provider  ADVAIR DISKUS 250-50 MCG/DOSE AEPB Take 1 puff by mouth 2 (two) times daily. 04/18/15  Historical Provider, MD  ALPRAZolam Duanne Moron) 0.5 MG tablet Take 1 tablet by mouth 2 (two) times daily as needed. 04/19/15   Historical Provider, MD  atenolol (TENORMIN) 50 MG tablet Take 1 tablet (50 mg total) by mouth daily. 05/18/15   Tama High III, MD  Cyanocobalamin (VITAMIN B 12 PO) Take 1 capsule by mouth daily.    Historical Provider, MD  doxycycline (VIBRA-TABS) 100 MG tablet Take 1 tablet (100 mg total) by mouth every 12 (twelve) hours. X 7 days 05/18/15   Tama High III, MD  DULoxetine (CYMBALTA) 20 MG  capsule Take 1 capsule by mouth daily. 02/11/15   Historical Provider, MD  escitalopram (LEXAPRO) 10 MG tablet Take 1 tablet by mouth daily. 04/12/15   Historical Provider, MD  HYDROcodone-acetaminophen (NORCO) 10-325 MG per tablet Take 1 tablet by mouth every 6 (six) hours as needed (Took half a tablet on wednesday.).  04/28/15   Historical Provider, MD  levofloxacin (LEVAQUIN) 750 MG tablet Take 1 tablet (750 mg total) by mouth daily. 12/02/15   Lisa Roca, MD  losartan (COZAAR) 50 MG tablet Take 1 tablet (50 mg total) by mouth daily. 05/18/15   Tama High III, MD  montelukast (SINGULAIR) 10 MG tablet Take 1 tablet by mouth daily. 03/17/15   Historical Provider, MD  predniSONE (STERAPRED UNI-PAK 21 TAB) 10 MG (21) TBPK tablet Start 4 po daily; decrease by 1 every 2 days 05/18/15   Adin Hector, MD  PROAIR HFA 108 (90 BASE) MCG/ACT inhaler Take 2 puffs by mouth every 6 (six) hours as needed. 04/12/15   Historical Provider, MD  SPIRIVA HANDIHALER 18 MCG inhalation capsule Take 1 Inhaler by mouth daily. 04/24/15   Historical Provider, MD  VITAMIN D, CHOLECALCIFEROL, PO Take 1 capsule by mouth daily.    Historical Provider, MD  vitamin E 100 UNIT capsule Take 100 Units by mouth daily.    Historical Provider, MD      VITAL SIGNS:  Blood pressure 113/53, pulse 100, temperature 98.8 F (37.1 C), resp. rate 26, height '5\' 4"'$  (1.626 m), weight 130 lb (58.968 kg), SpO2 95 %.  PHYSICAL EXAMINATION:  VITAL SIGNS: Filed Vitals:   12/03/15 0030 12/03/15 0100  BP: 105/49 113/53  Pulse: 105 100  Temp:    Resp: 60 26   GENERAL:75 y.o.female currently in minimal acute distress given respiratory status.  HEAD: Normocephalic, atraumatic.  EYES: Pupils equal, round, reactive to light. Extraocular muscles intact. No scleral icterus.  MOUTH: Moist mucosal membrane. Dentition intact. No abscess noted.  EAR, NOSE, THROAT: Clear without exudates. No external lesions.  NECK: Supple. No thyromegaly. No nodules.  No JVD.  PULMONARY: Diffuse expiratory wheezing with scattered coarse rhonchi, tachypneic without.use of accessory muscles, Good respiratory effort. Poor air entry bilaterally CHEST: Nontender to palpation.  CARDIOVASCULAR: S1 and S2. Tachycardic. No murmurs, rubs, or gallops. No edema. Pedal pulses 2+ bilaterally.  GASTROINTESTINAL: Soft, nontender, nondistended. No masses. Positive bowel sounds. No hepatosplenomegaly.  MUSCULOSKELETAL: No swelling, clubbing, or edema. Range of motion full in all extremities.  NEUROLOGIC: Cranial nerves II through XII are intact. No gross focal neurological deficits. Sensation intact. Reflexes intact.  SKIN: No ulceration, lesions, rashes, or cyanosis. Skin warm and dry. Turgor intact.  PSYCHIATRIC: Mood, affect within normal limits. The patient is awake, alert and oriented x 3. Insight, judgment intact.    LABORATORY PANEL:   CBC  Recent Labs Lab 12/02/15 2322  WBC 18.1*  HGB 12.2  HCT 36.5  PLT 213   ------------------------------------------------------------------------------------------------------------------  Chemistries   Recent Labs Lab 12/02/15 2322  NA 129*  K 4.0  CL 94*  CO2 26  GLUCOSE 118*  BUN 16  CREATININE 0.84  CALCIUM 9.1  AST 11*  ALT 9*  ALKPHOS 60  BILITOT 0.9   ------------------------------------------------------------------------------------------------------------------  Cardiac Enzymes  Recent Labs Lab 12/02/15 2322  TROPONINI 0.09*   ------------------------------------------------------------------------------------------------------------------  RADIOLOGY:  Dg Chest 2 View  12/02/2015  CLINICAL DATA:  Acute onset of cough and fever. Poor appetite. Initial encounter. EXAM: CHEST  2 VIEW COMPARISON:  Chest radiograph performed earlier today at 10:36 a.m., and CTA of the chest performed 09/23/2015 FINDINGS: Chronic biapical scarring, more prominent on the right, is similar in appearance to prior  studies. Peribronchial thickening is noted. No definite pleural effusion or pneumothorax is seen. The heart is normal in size. No acute osseous abnormalities are identified. IMPRESSION: No definite acute airspace consolidation seen. Biapical scarring again noted, more prominent on the right. Electronically Signed   By: Garald Balding M.D.   On: 12/02/2015 23:52   Dg Chest 2 View  12/02/2015  CLINICAL DATA:  Short of breath. Productive cough. Symptoms for 3 day. EXAM: CHEST  2 VIEW COMPARISON:  09/23/2015 FINDINGS: Bilateral heterogeneous opacities are stable. Bilateral apical pleural thickening right greater than left is stable. Normal heart size. No pneumothorax. Hyperaeration. IMPRESSION: No active cardiopulmonary disease. Electronically Signed   By: Marybelle Killings M.D.   On: 12/02/2015 10:50    EKG:   Orders placed or performed during the hospital encounter of 12/02/15  . ED EKG  . ED EKG  . EKG 12-Lead  . EKG 12-Lead    IMPRESSION AND PLAN:   75 year old Caucasian female history of COPD non-oxygen dependent presenting with shortness of breath  1.Chronic obstructive pulmonary disease exacerbation: Provide DuoNeb treatments q. 4 hours, Solu-Medrol 60 mg IV q. daily, will continue Levaquin as started in emergency department. Continue with home medications. She does have leukocytosis however has recently finished a steroid taper. 2. Hyponatremia: Check urine sodium, osmolality, gentle IV fluid hydration follow sodium level III. Elevated troponin: Place on telemetry trend cardiac enzymes 3 4. Essential hypertension: Atenolol 5. Venous thromboembolism prophylactic: Heparin subcutaneous      All the records are reviewed and case discussed with ED provider. Management plans discussed with the patient, family and they are in agreement.  CODE STATUS: Full  TOTAL TIME TAKING CARE OF THIS PATIENT: 35 minutes.    Luceal Hollibaugh,  Karenann Cai.D on 12/03/2015 at 1:06 AM  Between 7am to 6pm - Pager -  [phone]  After 6pm: House Pager: - [facility]  Office  [phone]  CC: Primary care physician; Adin Hector, MD

## 2015-12-04 DIAGNOSIS — J441 Chronic obstructive pulmonary disease with (acute) exacerbation: Secondary | ICD-10-CM | POA: Diagnosis not present

## 2015-12-04 DIAGNOSIS — E871 Hypo-osmolality and hyponatremia: Secondary | ICD-10-CM | POA: Diagnosis not present

## 2015-12-04 DIAGNOSIS — I10 Essential (primary) hypertension: Secondary | ICD-10-CM | POA: Diagnosis not present

## 2015-12-04 DIAGNOSIS — R7989 Other specified abnormal findings of blood chemistry: Secondary | ICD-10-CM | POA: Diagnosis not present

## 2015-12-04 LAB — CBC
HCT: 32.5 % — ABNORMAL LOW (ref 35.0–47.0)
HEMOGLOBIN: 10.6 g/dL — AB (ref 12.0–16.0)
MCH: 26.9 pg (ref 26.0–34.0)
MCHC: 32.7 g/dL (ref 32.0–36.0)
MCV: 82.5 fL (ref 80.0–100.0)
PLATELETS: 230 10*3/uL (ref 150–440)
RBC: 3.94 MIL/uL (ref 3.80–5.20)
RDW: 13.8 % (ref 11.5–14.5)
WBC: 12.9 10*3/uL — ABNORMAL HIGH (ref 3.6–11.0)

## 2015-12-04 LAB — BASIC METABOLIC PANEL
ANION GAP: 3 — AB (ref 5–15)
BUN: 23 mg/dL — ABNORMAL HIGH (ref 6–20)
CALCIUM: 8.7 mg/dL — AB (ref 8.9–10.3)
CO2: 28 mmol/L (ref 22–32)
Chloride: 104 mmol/L (ref 101–111)
Creatinine, Ser: 0.71 mg/dL (ref 0.44–1.00)
Glucose, Bld: 134 mg/dL — ABNORMAL HIGH (ref 65–99)
Potassium: 4.6 mmol/L (ref 3.5–5.1)
SODIUM: 135 mmol/L (ref 135–145)

## 2015-12-04 MED ORDER — PREDNISONE 10 MG (21) PO TBPK: 10.0000 mg | ORAL_TABLET | Freq: Every day | ORAL | Status: DC

## 2015-12-04 MED ORDER — LEVOFLOXACIN 750 MG PO TABS: 750.0000 mg | ORAL_TABLET | Freq: Every day | ORAL | Status: DC

## 2015-12-04 MED ORDER — PREDNISONE 5 MG PO TABS: 5.0000 mg | ORAL_TABLET | Freq: Every day | ORAL | Status: DC

## 2015-12-04 NOTE — Discharge Instructions (Signed)
Activity as tolerated Continue 2 L of oxygen via nasal cannula Diet low-salt Follow-up with primary care physician in a week

## 2015-12-04 NOTE — Discharge Summary (Signed)
[facility] [place] NAME: Robin Hudson    MR#:  [id number]  DATE OF BIRTH:  Apr 23, 1941  DATE OF ADMISSION:  12/02/2015 ADMITTING PHYSICIAN: Lytle Butte, MD  DATE OF DISCHARGE: 12/04/2015 PRIMARY CARE PHYSICIAN: Tama High III, MD    ADMISSION DIAGNOSIS:  Acute respiratory failure with hypoxia ([place]) [J96.01]  DISCHARGE DIAGNOSIS:  Active Problems:   COPD exacerbation ([facility])   Elevated troponin  hyponatremia  SECONDARY DIAGNOSIS:   Past Medical History  Diagnosis Date  . COPD (chronic obstructive pulmonary disease) ([place])   . Hypertension   . Asthma   . CHF (congestive heart failure) ([place])   . Hyperlipemia   . B12 deficiency   . Osteoporosis   . Right adrenal mass ([place])   . Allergic rhinitis   . MRSA pneumonia ([place])   . Colon cancer ([place]) 2010  . Colon cancer ([place])   . Carcinoma of right breast treated with adjuvant chemotherapy ([place]) 1999    RT LUMPECTOMY  . Breast cancer ([place]) 1999    RT LUMPECTOMY  . Radiation 1999    BREAST CA    HOSPITAL COURSE:   75 year old Caucasian female history of COPD non-oxygen dependent presenting with shortness of breath  1.Chronic obstructive pulmonary disease exacerbation:  Provided DuoNeb treatments q. 4 hours,  Clinically improving, Solu-Medrol tapered, will continue Levaquin as started in emergency department. Continue with home medications . She does have leukocytosis however she is on by mouth prednisone at home Discharging patient home with prednisone tapering dose followed by continuation of 5 mg of by mouth prednisone which is her home medication  PCP to refer the patient to pulmonology if needed Continue chronic 2 L of home oxygen via nasal cannula   2. Hyponatremia:  IV fluid hydration given and her hyponatremia improved sodium level 129-130-135  3. Elevated troponin: No events on telemetry  Patient is asymptomatic with no chest pain  No trending cardiac  enzymes-0.11-0.06  4. Essential hypertension: Atenolol  5. Venous thromboembolism prophylactic: Heparin subcutaneous  DISCHARGE CONDITIONS:   Fair  CONSULTS OBTAINED:  Treatment Team:  Lytle Butte, MD   PROCEDURES  none DRUG ALLERGIES:   Allergies  Allergen Reactions  . Coreg [Carvedilol] Other (See Comments)    syncope    DISCHARGE MEDICATIONS:   Current Discharge Medication List    START taking these medications   Details  predniSONE (STERAPRED UNI-PAK 21 TAB) 10 MG (21) TBPK tablet Take 1 tablet (10 mg total) by mouth daily. Take 6 tablets by mouth for 1 day followed by  5 tablets by mouth for 1 day followed by  4 tablets by mouth for 1 day followed by  3 tablets by mouth for 1 day followed by  2 tablets by mouth for 1 day followed by  1 tablet by mouth for a day followed by 5 mg of prednisone by mouth once daily(home medication) Qty: 21 tablet, Refills: 0      CONTINUE these medications which have CHANGED   Details  levofloxacin (LEVAQUIN) 750 MG tablet Take 1 tablet (750 mg total) by mouth daily. Qty: 5 tablet, Refills: 0    predniSONE (DELTASONE) 5 MG tablet Take 1 tablet (5 mg total) by mouth daily with breakfast. Start taking this 5 mg prednisone tablet after completing prednisone tapering dose Qty: 30 tablet, Refills: 0      CONTINUE these medications which have NOT CHANGED   Details  ADVAIR DISKUS 250-50 MCG/DOSE  AEPB Take 1 puff by mouth 2 (two) times daily. Refills: 4    ALPRAZolam (XANAX) 0.5 MG tablet Take 1 tablet by mouth 2 (two) times daily as needed for anxiety.  Refills: 3    atenolol (TENORMIN) 50 MG tablet Take 1 tablet (50 mg total) by mouth daily. Qty: 30 tablet, Refills: 11    azelastine (ASTELIN) 0.1 % nasal spray Place 1 spray into both nostrils 2 (two) times daily. Use in each nostril as directed    benzonatate (TESSALON) 100 MG capsule Take 200 mg by mouth 3 (three) times daily as needed for cough.    budesonide-formoterol  (SYMBICORT) 160-4.5 MCG/ACT inhaler Inhale 2 puffs into the lungs 2 (two) times daily.    cholecalciferol (VITAMIN D) 1000 units tablet Take 1,000 Units by mouth daily.    escitalopram (LEXAPRO) 10 MG tablet Take 1 tablet by mouth daily. Refills: 11    HYDROcodone-acetaminophen (NORCO) 10-325 MG per tablet Take 0.5-1 tablets by mouth 3 (three) times daily as needed for moderate pain or severe pain.  Refills: 0    hydroxychloroquine (PLAQUENIL) 200 MG tablet Take 400 mg by mouth daily.    losartan (COZAAR) 50 MG tablet Take 1 tablet (50 mg total) by mouth daily. Qty: 30 tablet, Refills: 11    montelukast (SINGULAIR) 10 MG tablet Take 1 tablet by mouth daily. Refills: 3    PROAIR HFA 108 (90 BASE) MCG/ACT inhaler Take 2 puffs by mouth every 6 (six) hours as needed for wheezing.  Refills: 12    SPIRIVA HANDIHALER 18 MCG inhalation capsule Take 1 Inhaler by mouth daily. Refills: 11    tiZANidine (ZANAFLEX) 2 MG tablet Take 1-2 mg by mouth daily as needed for muscle spasms.    vitamin B-12 (CYANOCOBALAMIN) 1000 MCG tablet Take 1,000 mcg by mouth daily.    vitamin E 100 UNIT capsule Take 100 Units by mouth daily.      STOP taking these medications     DULoxetine (CYMBALTA) 20 MG capsule          DISCHARGE INSTRUCTIONS:   Activity as tolerated Continue 2 L of oxygen via nasal cannula Diet low-salt Follow-up with primary care physician in a week   DIET:  Cardiac diet  DISCHARGE CONDITION:  Fair  ACTIVITY:  Activity as tolerated  OXYGEN:  Home Oxygen: Yes.     Oxygen Delivery: 2 liters/min via Patient connected to nasal cannula oxygen  DISCHARGE LOCATION:  home   If you experience worsening of your admission symptoms, develop shortness of breath, life threatening emergency, suicidal or homicidal thoughts you must seek medical attention immediately by calling 911 or calling your MD immediately  if symptoms less severe.  You Must read complete  instructions/literature along with all the possible adverse reactions/side effects for all the Medicines you take and that have been prescribed to you. Take any new Medicines after you have completely understood and accpet all the possible adverse reactions/side effects.   Please note  You were cared for by a hospitalist during your hospital stay. If you have any questions about your discharge medications or the care you received while you were in the hospital after you are discharged, you can call the unit and asked to speak with the hospitalist on call if the hospitalist that took care of you is not available. Once you are discharged, your primary care physician will handle any further medical issues. Please note that NO REFILLS for any discharge medications will be authorized once you are  discharged, as it is imperative that you return to your primary care physician (or establish a relationship with a primary care physician if you do not have one) for your aftercare needs so that they can reassess your need for medications and monitor your lab values.     Today  Chief Complaint  Patient presents with  . Shortness of Breath   Patient is feeling better. Denies any chest pain or shortness of breath. Denies any wheezing and wants to go home  ROS:  CONSTITUTIONAL: Denies fevers, chills. Denies any fatigue, weakness.  EYES: Denies blurry vision, double vision, eye pain. EARS, NOSE, THROAT: Denies tinnitus, ear pain, hearing loss. RESPIRATORY: Denies cough, wheeze, shortness of breath.  CARDIOVASCULAR: Denies chest pain, palpitations, edema.  GASTROINTESTINAL: Denies nausea, vomiting, diarrhea, abdominal pain. Denies bright red blood per rectum. GENITOURINARY: Denies dysuria, hematuria. ENDOCRINE: Denies nocturia or thyroid problems. HEMATOLOGIC AND LYMPHATIC: Denies easy bruising or bleeding. SKIN: Denies rash or lesion. MUSCULOSKELETAL: Denies pain in neck, back, shoulder, knees, hips or  arthritic symptoms.  NEUROLOGIC: Denies paralysis, paresthesias.  PSYCHIATRIC: Denies anxiety or depressive symptoms.   VITAL SIGNS:  Blood pressure 130/56, pulse 66, temperature 97.6 F (36.4 C), temperature source Oral, resp. rate 19, height '5\' 4"'$  (1.626 m), weight 59.149 kg (130 lb 6.4 oz), SpO2 97 %.  I/O:   Intake/Output Summary (Last 24 hours) at 12/04/15 1200 Last data filed at 12/04/15 0900  Gross per 24 hour  Intake 1908.75 ml  Output    250 ml  Net 1658.75 ml    PHYSICAL EXAMINATION:  GENERAL:  75 y.o.-year-old patient lying in the bed with no acute distress.  EYES: Pupils equal, round, reactive to light and accommodation. No scleral icterus. Extraocular muscles intact.  HEENT: Head atraumatic, normocephalic. Oropharynx and nasopharynx clear.  NECK:  Supple, no jugular venous distention. No thyroid enlargement, no tenderness.  LUNGS: Normal breath sounds bilaterally, no wheezing, rales,rhonchi or crepitation. No use of accessory muscles of respiration.  CARDIOVASCULAR: S1, S2 normal. No murmurs, rubs, or gallops.  ABDOMEN: Soft, non-tender, non-distended. Bowel sounds present. No organomegaly or mass.  EXTREMITIES: No pedal edema, cyanosis, or clubbing.  NEUROLOGIC: Cranial nerves II through XII are intact. Muscle strength 5/5 in all extremities. Sensation intact. Gait not checked.  PSYCHIATRIC: The patient is alert and oriented x 3.  SKIN: No obvious rash, lesion, or ulcer.   DATA REVIEW:   CBC  Recent Labs Lab 12/04/15 0524  WBC 12.9*  HGB 10.6*  HCT 32.5*  PLT 230    Chemistries   Recent Labs Lab 12/02/15 2322  12/04/15 0524  NA 129*  < > 135  K 4.0  < > 4.6  CL 94*  < > 104  CO2 26  < > 28  GLUCOSE 118*  < > 134*  BUN 16  < > 23*  CREATININE 0.84  < > 0.71  CALCIUM 9.1  < > 8.7*  AST 11*  --   --   ALT 9*  --   --   ALKPHOS 60  --   --   BILITOT 0.9  --   --   < > = values in this interval not displayed.  Cardiac Enzymes  Recent  Labs Lab 12/03/15 1406  TROPONINI 0.03    Microbiology Results  Results for orders placed or performed during the hospital encounter of 12/02/15  Blood culture (routine x 2)     Status: None (Preliminary result)   Collection Time: 12/02/15 11:22 PM  Result Value Ref Range Status   Specimen Description BLOOD RIGHT FACE  Final   Special Requests BOTTLES DRAWN AEROBIC AND ANAEROBIC 5CC  Final   Culture NO GROWTH < 24 HOURS  Final   Report Status PENDING  Incomplete  Blood culture (routine x 2)     Status: None (Preliminary result)   Collection Time: 12/02/15 11:22 PM  Result Value Ref Range Status   Specimen Description BLOOD LEFT HAND  Final   Special Requests BOTTLES DRAWN AEROBIC AND ANAEROBIC 4CC  Final   Culture NO GROWTH < 24 HOURS  Final   Report Status PENDING  Incomplete    RADIOLOGY:  Dg Chest 2 View  12/02/2015  CLINICAL DATA:  Acute onset of cough and fever. Poor appetite. Initial encounter. EXAM: CHEST  2 VIEW COMPARISON:  Chest radiograph performed earlier today at 10:36 a.m., and CTA of the chest performed 09/23/2015 FINDINGS: Chronic biapical scarring, more prominent on the right, is similar in appearance to prior studies. Peribronchial thickening is noted. No definite pleural effusion or pneumothorax is seen. The heart is normal in size. No acute osseous abnormalities are identified. IMPRESSION: No definite acute airspace consolidation seen. Biapical scarring again noted, more prominent on the right. Electronically Signed   By: Garald Balding M.D.   On: 12/02/2015 23:52   Dg Chest 2 View  12/02/2015  CLINICAL DATA:  Short of breath. Productive cough. Symptoms for 3 day. EXAM: CHEST  2 VIEW COMPARISON:  09/23/2015 FINDINGS: Bilateral heterogeneous opacities are stable. Bilateral apical pleural thickening right greater than left is stable. Normal heart size. No pneumothorax. Hyperaeration. IMPRESSION: No active cardiopulmonary disease. Electronically Signed   By: Marybelle Killings  M.D.   On: 12/02/2015 10:50    EKG:   Orders placed or performed during the hospital encounter of 12/02/15  . ED EKG  . ED EKG  . EKG 12-Lead  . EKG 12-Lead      Management plans discussed with the patient, family and they are in agreement.  CODE STATUS:     Code Status Orders        Start     Ordered   12/03/15 0049  Full code   Continuous     12/03/15 0049    Code Status History    Date Active Date Inactive Code Status Order ID Comments User Context   05/12/2015  3:09 PM 05/18/2015 12:39 PM Full Code [id number]  Dustin Flock, MD Inpatient      TOTAL TIME TAKING CARE OF THIS PATIENT: 45 minutes.    '@[facility]'$ @  on 12/04/2015 at 12:00 PM  Between 7am to 6pm - Pager - [phone]  After 6pm go to [email or web address] - password [email or web address] [facility]  [facility]  Office  [phone]  CC: Primary care physician; Adin Hector, MD

## 2015-12-04 NOTE — Progress Notes (Signed)
Pt for discharge home. Instructions discussed with pt . presc given and discussed and home meds discussed.  02 2l [place]. No  Distress.  Sob  With increased exertion.  Sl x2 d/cd/  Diet/ activity and f/u discussed . Home at this time via w/c with husband w/o c/o.

## 2015-12-04 NOTE — Plan of Care (Signed)
Problem: Safety: Goal: Ability to remain free from injury will improve Outcome: Progressing Pt remains free from injury.  Pt is independent to BR.  Calls with needs.  Problem: Pain Managment: Goal: General experience of comfort will improve Outcome: Progressing Pt has chronic back pain in which oxycodone responds well.  Given immediately before evening shift began.  One xanax given during the shift for anxiousness.

## 2015-12-05 ENCOUNTER — Emergency Department: Payer: PPO

## 2015-12-05 ENCOUNTER — Inpatient Hospital Stay
Admission: EM | Admit: 2015-12-05 | Discharge: 2015-12-07 | DRG: 190 | Disposition: A | Discharge status: Home / Self Care | Payer: PPO | Attending: Internal Medicine | Admitting: Internal Medicine

## 2015-12-05 DIAGNOSIS — J45909 Unspecified asthma, uncomplicated: Secondary | ICD-10-CM | POA: Diagnosis present

## 2015-12-05 DIAGNOSIS — J441 Chronic obstructive pulmonary disease with (acute) exacerbation: Secondary | ICD-10-CM | POA: Diagnosis not present

## 2015-12-05 DIAGNOSIS — Z803 Family history of malignant neoplasm of breast: Secondary | ICD-10-CM | POA: Diagnosis not present

## 2015-12-05 DIAGNOSIS — E876 Hypokalemia: Secondary | ICD-10-CM | POA: Diagnosis not present

## 2015-12-05 DIAGNOSIS — E785 Hyperlipidemia, unspecified: Secondary | ICD-10-CM | POA: Diagnosis present

## 2015-12-05 DIAGNOSIS — M810 Age-related osteoporosis without current pathological fracture: Secondary | ICD-10-CM | POA: Diagnosis not present

## 2015-12-05 DIAGNOSIS — Z720 Tobacco use: Secondary | ICD-10-CM | POA: Diagnosis not present

## 2015-12-05 DIAGNOSIS — J9621 Acute and chronic respiratory failure with hypoxia: Secondary | ICD-10-CM | POA: Diagnosis present

## 2015-12-05 DIAGNOSIS — R05 Cough: Secondary | ICD-10-CM | POA: Diagnosis not present

## 2015-12-05 DIAGNOSIS — Z9981 Dependence on supplemental oxygen: Secondary | ICD-10-CM

## 2015-12-05 DIAGNOSIS — I110 Hypertensive heart disease with heart failure: Secondary | ICD-10-CM | POA: Diagnosis present

## 2015-12-05 DIAGNOSIS — I10 Essential (primary) hypertension: Secondary | ICD-10-CM | POA: Diagnosis not present

## 2015-12-05 DIAGNOSIS — Z85038 Personal history of other malignant neoplasm of large intestine: Secondary | ICD-10-CM

## 2015-12-05 DIAGNOSIS — Z8249 Family history of ischemic heart disease and other diseases of the circulatory system: Secondary | ICD-10-CM | POA: Diagnosis not present

## 2015-12-05 DIAGNOSIS — J449 Chronic obstructive pulmonary disease, unspecified: Secondary | ICD-10-CM | POA: Diagnosis not present

## 2015-12-05 DIAGNOSIS — Z923 Personal history of irradiation: Secondary | ICD-10-CM | POA: Diagnosis not present

## 2015-12-05 DIAGNOSIS — I509 Heart failure, unspecified: Secondary | ICD-10-CM | POA: Diagnosis not present

## 2015-12-05 DIAGNOSIS — F17210 Nicotine dependence, cigarettes, uncomplicated: Secondary | ICD-10-CM | POA: Diagnosis present

## 2015-12-05 DIAGNOSIS — F329 Major depressive disorder, single episode, unspecified: Secondary | ICD-10-CM | POA: Diagnosis present

## 2015-12-05 DIAGNOSIS — Z853 Personal history of malignant neoplasm of breast: Secondary | ICD-10-CM | POA: Diagnosis not present

## 2015-12-05 DIAGNOSIS — R0902 Hypoxemia: Secondary | ICD-10-CM | POA: Diagnosis present

## 2015-12-05 DIAGNOSIS — R0600 Dyspnea, unspecified: Secondary | ICD-10-CM | POA: Diagnosis not present

## 2015-12-05 MED ORDER — IPRATROPIUM-ALBUTEROL 0.5-2.5 (3) MG/3ML IN SOLN
3.0000 mL | Freq: Once | RESPIRATORY_TRACT | Status: AC
  Administered 2015-12-06: 3 mL via RESPIRATORY_TRACT

## 2015-12-05 MED ORDER — LEVOFLOXACIN IN D5W 500 MG/100ML IV SOLN
500.0000 mg | Freq: Once | INTRAVENOUS | Status: AC
  Administered 2015-12-06: 500 mg via INTRAVENOUS
  Filled 2015-12-05: qty 100

## 2015-12-05 MED ORDER — METHYLPREDNISOLONE SODIUM SUCC 125 MG IJ SOLR
125.0000 mg | Freq: Once | INTRAMUSCULAR | Status: AC
  Administered 2015-12-06: 125 mg via INTRAVENOUS
  Filled 2015-12-05: qty 2

## 2015-12-05 MED ORDER — IPRATROPIUM-ALBUTEROL 0.5-2.5 (3) MG/3ML IN SOLN
RESPIRATORY_TRACT | Status: AC
  Administered 2015-12-06: 3 mL via RESPIRATORY_TRACT
  Filled 2015-12-05: qty 6

## 2015-12-05 NOTE — ED Provider Notes (Signed)
[facility] Emergency Department Provider Note  ____________________________________________  Time seen: 11:20 PM  I have reviewed the triage vital signs and the nursing notes.   HISTORY  Chief Complaint Shortness of Breath      HPI Robin Hudson is a 75 y.o. female presents with progressive dyspnea and productive cough 4 days. Of note patient was recently admitted to the hospital for acute respiratory failure with hypoxia and subsequently discharged today. Patient states that her symptoms progressively worsened over the course of being home.    Past Medical History  Diagnosis Date  . COPD (chronic obstructive pulmonary disease) ([place])   . Hypertension   . Asthma   . CHF (congestive heart failure) ([place])   . Hyperlipemia   . B12 deficiency   . Osteoporosis   . Right adrenal mass ([place])   . Allergic rhinitis   . MRSA pneumonia ([place])   . Colon cancer ([place]) 2010  . Colon cancer ([place])   . Carcinoma of right breast treated with adjuvant chemotherapy ([place]) 1999    RT LUMPECTOMY  . Breast cancer ([place]) 1999    RT LUMPECTOMY  . Radiation 1999    BREAST CA    Patient Active Problem List   Diagnosis Date Noted  . COPD exacerbation ([place]) 12/03/2015  . Elevated troponin 12/03/2015  . Elevated antinuclear antibody (ANA) level 05/15/2015  . Hyperlipemia 05/12/2015  . COPD (chronic obstructive pulmonary disease) ([place]) 05/12/2015  . HTN (hypertension) 05/12/2015  . Osteoporosis 05/12/2015  . 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 B12 deficiency 05/12/2015  . Allergic rhinitis 05/12/2015  . Breast cancer ([place]) 05/12/2015  . Colon cancer ([place]) 05/12/2015  . Closed head injury 12/15/2014    Past Surgical History  Procedure Laterality Date  . Abdominal hysterectomy    . Appendectomy    . Colectomy      partial  . Image guided sinus surgery      Current Outpatient Rx  Name  Route  Sig  Dispense  Refill  . ADVAIR DISKUS 250-50 MCG/DOSE AEPB  Oral   Take 1 puff by mouth 2 (two) times daily.      4     Dispense as written.   Marland Kitchen ALPRAZolam (XANAX) 0.5 MG tablet   Oral   Take 1 tablet by mouth 2 (two) times daily as needed for anxiety.       3   . atenolol (TENORMIN) 50 MG tablet   Oral   Take 1 tablet (50 mg total) by mouth daily.   30 tablet   11   . azelastine (ASTELIN) 0.1 % nasal spray   Each Nare   Place 1 spray into both nostrils 2 (two) times daily. Use in each nostril as directed         . benzonatate (TESSALON) 100 MG capsule   Oral   Take 200 mg by mouth 3 (three) times daily as needed for cough.         . budesonide-formoterol (SYMBICORT) 160-4.5 MCG/ACT inhaler   Inhalation   Inhale 2 puffs into the lungs 2 (two) times daily.         . cholecalciferol (VITAMIN D) 1000 units tablet   Oral   Take 1,000 Units by mouth daily.         Marland Kitchen escitalopram (LEXAPRO) 10 MG tablet   Oral   Take 1 tablet by mouth daily.      11   . HYDROcodone-acetaminophen (NORCO) 10-325 MG per tablet  Oral   Take 0.5-1 tablets by mouth 3 (three) times daily as needed for moderate pain or severe pain.       0   . hydroxychloroquine (PLAQUENIL) 200 MG tablet   Oral   Take 400 mg by mouth daily.         Marland Kitchen levofloxacin (LEVAQUIN) 750 MG tablet   Oral   Take 1 tablet (750 mg total) by mouth daily.   5 tablet   0   . losartan (COZAAR) 50 MG tablet   Oral   Take 1 tablet (50 mg total) by mouth daily.   30 tablet   11   . montelukast (SINGULAIR) 10 MG tablet   Oral   Take 1 tablet by mouth daily.      3   . predniSONE (DELTASONE) 5 MG tablet   Oral   Take 1 tablet (5 mg total) by mouth daily with breakfast. Start taking this 5 mg prednisone tablet after completing prednisone tapering dose   30 tablet   0   . predniSONE (STERAPRED UNI-PAK 21 TAB) 10 MG (21) TBPK tablet   Oral   Take 1 tablet (10 mg total) by mouth daily. Take 6 tablets by mouth for 1 day followed by  5 tablets by mouth for 1 day  followed by  4 tablets by mouth for 1 day followed by  3 tablets by mouth for 1 day followed by  2 tablets by mouth for 1 day followed by  1 tablet by mouth for a day followed by 5 mg of prednisone by mouth once daily(home medication)   21 tablet   0   . PROAIR HFA 108 (90 BASE) MCG/ACT inhaler   Oral   Take 2 puffs by mouth every 6 (six) hours as needed for wheezing.       12     Dispense as written.   Marland Kitchen SPIRIVA HANDIHALER 18 MCG inhalation capsule   Oral   Take 1 Inhaler by mouth daily.      11     Dispense as written.   Marland Kitchen tiZANidine (ZANAFLEX) 2 MG tablet   Oral   Take 1-2 mg by mouth daily as needed for muscle spasms.         . vitamin B-12 (CYANOCOBALAMIN) 1000 MCG tablet   Oral   Take 1,000 mcg by mouth daily.         . vitamin E 100 UNIT capsule   Oral   Take 100 Units by mouth daily.           Allergies Coreg  Family History  Problem Relation Age of Onset  . Heart attack Mother   . Breast cancer Sister 22  . Breast cancer Sister 32  . Breast cancer Other     Social History Social History  Substance Use Topics  . Smoking status: Current Every Day Smoker -- 0.25 packs/day    Types: Cigarettes  . Smokeless tobacco: Never Used  . Alcohol Use: No    Review of Systems  Constitutional: Negative for fever. Eyes: Negative for visual changes. ENT: Negative for sore throat. Cardiovascular: Negative for chest pain. Respiratory: Positive for cough and dyspnea Gastrointestinal: Negative for abdominal pain, vomiting and diarrhea. Genitourinary: Negative for dysuria. Musculoskeletal: Negative for back pain. Skin: Negative for rash. Neurological: Negative for headaches, focal weakness or numbness.   10-point ROS otherwise negative.  ____________________________________________   PHYSICAL EXAM:  VITAL SIGNS: ED Triage Vitals  Enc Vitals Group  BP 12/05/15 2207 138/56 mmHg     Pulse Rate 12/05/15 2207 90     Resp 12/05/15 2207 24      Temp 12/05/15 2207 98 F (36.7 C)     Temp Source 12/05/15 2207 Oral     SpO2 12/05/15 2207 88 %     Weight 12/05/15 2207 130 lb (58.968 kg)     Height 12/05/15 2207 '5\' 5"'$  (1.651 m)     Head Cir --      Peak Flow --      Pain Score 12/05/15 2207 10     Pain Loc --      Pain Edu? --      Excl. in [place]? --      Constitutional: Alert and oriented. Apparent respiratory distress Eyes: Conjunctivae are normal. PERRL. Normal extraocular movements. ENT   Head: Normocephalic and atraumatic.   Nose: No congestion/rhinnorhea.   Mouth/Throat: Mucous membranes are moist.   Neck: No stridor. Hematological/Lymphatic/Immunilogical: No cervical lymphadenopathy. Cardiovascular: Normal rate, regular rhythm. Normal and symmetric distal pulses are present in all extremities. No murmurs, rubs, or gallops. Respiratory: Tachypnea, accessory muscle use, diffuse rhonchi with extremely wheezes Gastrointestinal: Soft and nontender. No distention. There is no CVA tenderness. Genitourinary: deferred Musculoskeletal: Nontender with normal range of motion in all extremities. No joint effusions.  No lower extremity tenderness nor edema. Neurologic:  Normal speech and language. No gross focal neurologic deficits are appreciated. Speech is normal.  Skin:  Skin is warm, dry and intact. No rash noted. Psychiatric: Mood and affect are normal. Speech and behavior are normal. Patient exhibits appropriate insight and judgment.  ____________________________________________    LABS (pertinent positives/negatives)  Labs Reviewed  CBC WITH DIFFERENTIAL/PLATELET - Abnormal; Notable for the following:    WBC 12.2 (*)    Hemoglobin 11.3 (*)    HCT 34.0 (*)    Neutro Abs 10.1 (*)    Lymphs Abs 0.9 (*)    Monocytes Absolute 1.2 (*)    All other components within normal limits  BASIC METABOLIC PANEL - Abnormal; Notable for the following:    Potassium 3.4 (*)    Chloride 97 (*)    Calcium 8.7 (*)    All  other components within normal limits  CULTURE, BLOOD (ROUTINE X 2)  CULTURE, BLOOD (ROUTINE X 2)  TROPONIN I  BRAIN NATRIURETIC PEPTIDE     ____________________________________________   EKG  ED ECG REPORT I, BROWN, [place] N, the attending physician, personally viewed and interpreted this ECG.   Date: 12/06/2015  EKG Time: 10:17 PM  Rate: 83  Rhythm: Normal sinus rhythm  Axis: None  Intervals:none  ST&T Change: None   ____________________________________________    RADIOLOGY   DG Chest 2 View (Final result) Result time: 12/06/15 00:25:34   Final result by Rad Results In Interface (12/06/15 00:25:34)   Narrative:   CLINICAL DATA: 75 year old female with cough and dyspnea  EXAM: CHEST 2 VIEW  COMPARISON: Radiograph dated 12/02/2015  FINDINGS: Two views of the chest demonstrate emphysematous changes of the lungs with bibasilar subsegmental atelectasis/ scarring. Right apical pleural thickening/scarring. No focal consolidation, pleural effusion, new or pneumothorax. The cardiac silhouette is within normal limits. There is osteopenia with degenerative changes of the spine. No acute fracture.  IMPRESSION: Emphysema. No focal consolidation or pneumothorax.   Electronically Signed By: Anner Crete M.D. On: 12/06/2015 00:25          Critical Care performed: CRITICAL CARE Performed by: Marjean Donna N   Total critical  care time: 30 minutes  Critical care time was exclusive of separately billable procedures and treating other patients.  Critical care was necessary to treat or prevent imminent or life-threatening deterioration.  Critical care was time spent personally by me on the following activities: development of treatment plan with patient and/or surrogate as well as nursing, discussions with consultants, evaluation of patient's response to treatment, examination of patient, obtaining history from patient or surrogate, ordering and  performing treatments and interventions, ordering and review of laboratory studies, ordering and review of radiographic studies, pulse oximetry and re-evaluation of patient's condition.   ____________________________________________   INITIAL IMPRESSION / ASSESSMENT AND PLAN / ED COURSE  Pertinent labs & imaging results that were available during my care of the patient were reviewed by me and considered in my medical decision making (see chart for details).  Patient received 2 DuoNeb's as well as IV Solu-Medrol 125 mg. On reevaluation patient states that her respiratory status was improved. Patient discussed with Dr. Mathis Bud for admission for further evaluation and management.  ____________________________________________   FINAL CLINICAL IMPRESSION(S) / ED DIAGNOSES  Final diagnoses:  COPD exacerbation ([place])      Gregor Hams, MD 12/06/15 [phone]

## 2015-12-05 NOTE — ED Notes (Signed)
Pt in with co shob hx of copd was admitted recently for the same.

## 2015-12-05 NOTE — ED Notes (Signed)
Patient transported to X-ray 

## 2015-12-06 DIAGNOSIS — R0902 Hypoxemia: Secondary | ICD-10-CM | POA: Diagnosis present

## 2015-12-06 DIAGNOSIS — Z853 Personal history of malignant neoplasm of breast: Secondary | ICD-10-CM | POA: Diagnosis not present

## 2015-12-06 DIAGNOSIS — Z85038 Personal history of other malignant neoplasm of large intestine: Secondary | ICD-10-CM | POA: Diagnosis not present

## 2015-12-06 DIAGNOSIS — J9621 Acute and chronic respiratory failure with hypoxia: Secondary | ICD-10-CM | POA: Diagnosis not present

## 2015-12-06 DIAGNOSIS — I110 Hypertensive heart disease with heart failure: Secondary | ICD-10-CM | POA: Diagnosis not present

## 2015-12-06 DIAGNOSIS — Z803 Family history of malignant neoplasm of breast: Secondary | ICD-10-CM | POA: Diagnosis not present

## 2015-12-06 DIAGNOSIS — J45909 Unspecified asthma, uncomplicated: Secondary | ICD-10-CM | POA: Diagnosis not present

## 2015-12-06 DIAGNOSIS — Z923 Personal history of irradiation: Secondary | ICD-10-CM | POA: Diagnosis not present

## 2015-12-06 DIAGNOSIS — I509 Heart failure, unspecified: Secondary | ICD-10-CM | POA: Diagnosis not present

## 2015-12-06 DIAGNOSIS — E785 Hyperlipidemia, unspecified: Secondary | ICD-10-CM | POA: Diagnosis not present

## 2015-12-06 DIAGNOSIS — M810 Age-related osteoporosis without current pathological fracture: Secondary | ICD-10-CM | POA: Diagnosis not present

## 2015-12-06 DIAGNOSIS — J449 Chronic obstructive pulmonary disease, unspecified: Secondary | ICD-10-CM | POA: Diagnosis not present

## 2015-12-06 DIAGNOSIS — Z8249 Family history of ischemic heart disease and other diseases of the circulatory system: Secondary | ICD-10-CM | POA: Diagnosis not present

## 2015-12-06 DIAGNOSIS — R05 Cough: Secondary | ICD-10-CM | POA: Diagnosis not present

## 2015-12-06 DIAGNOSIS — F329 Major depressive disorder, single episode, unspecified: Secondary | ICD-10-CM | POA: Diagnosis not present

## 2015-12-06 DIAGNOSIS — F17210 Nicotine dependence, cigarettes, uncomplicated: Secondary | ICD-10-CM | POA: Diagnosis not present

## 2015-12-06 DIAGNOSIS — Z9981 Dependence on supplemental oxygen: Secondary | ICD-10-CM | POA: Diagnosis not present

## 2015-12-06 DIAGNOSIS — E876 Hypokalemia: Secondary | ICD-10-CM | POA: Diagnosis not present

## 2015-12-06 DIAGNOSIS — I10 Essential (primary) hypertension: Secondary | ICD-10-CM | POA: Diagnosis not present

## 2015-12-06 DIAGNOSIS — J441 Chronic obstructive pulmonary disease with (acute) exacerbation: Secondary | ICD-10-CM | POA: Diagnosis not present

## 2015-12-06 LAB — BASIC METABOLIC PANEL
Anion gap: 7 (ref 5–15)
BUN: 16 mg/dL (ref 6–20)
CO2: 32 mmol/L (ref 22–32)
CREATININE: 0.53 mg/dL (ref 0.44–1.00)
Calcium: 8.7 mg/dL — ABNORMAL LOW (ref 8.9–10.3)
Chloride: 97 mmol/L — ABNORMAL LOW (ref 101–111)
GFR calc Af Amer: >60 mL/min (ref >60)
Glucose, Bld: 91 mg/dL (ref 65–99)
Potassium: 3.4 mmol/L — ABNORMAL LOW (ref 3.5–5.1)
SODIUM: 136 mmol/L (ref 135–145)

## 2015-12-06 LAB — CBC WITH DIFFERENTIAL/PLATELET
BASOS PCT: 0 %
Basophils Absolute: 0 10*3/uL (ref 0–0.1)
EOS ABS: 0 10*3/uL (ref 0–0.7)
EOS PCT: 0 %
HCT: 34 % — ABNORMAL LOW (ref 35.0–47.0)
Hemoglobin: 11.3 g/dL — ABNORMAL LOW (ref 12.0–16.0)
LYMPHS ABS: 0.9 10*3/uL — AB (ref 1.0–3.6)
Lymphocytes Relative: 8 %
MCH: 27.3 pg (ref 26.0–34.0)
MCHC: 33.1 g/dL (ref 32.0–36.0)
MCV: 82.4 fL (ref 80.0–100.0)
MONOS PCT: 10 %
Monocytes Absolute: 1.2 10*3/uL — ABNORMAL HIGH (ref 0.2–0.9)
Neutro Abs: 10.1 10*3/uL — ABNORMAL HIGH (ref 1.4–6.5)
Neutrophils Relative %: 82 %
PLATELETS: 229 10*3/uL (ref 150–440)
RBC: 4.12 MIL/uL (ref 3.80–5.20)
RDW: 14.4 % (ref 11.5–14.5)
WBC: 12.2 10*3/uL — AB (ref 3.6–11.0)

## 2015-12-06 LAB — TSH: TSH: 1.434 u[IU]/mL (ref 0.350–4.500)

## 2015-12-06 LAB — HEMOGLOBIN A1C: Hgb A1c MFr Bld: 5.8 % (ref 4.0–6.0)

## 2015-12-06 LAB — BRAIN NATRIURETIC PEPTIDE: B NATRIURETIC PEPTIDE 5: 753 pg/mL — AB (ref 0.0–100.0)

## 2015-12-06 LAB — TROPONIN I: TROPONIN I: 0.03 ng/mL (ref <0.031)

## 2015-12-06 MED ORDER — ONDANSETRON HCL 4 MG/2ML IJ SOLN
4.0000 mg | Freq: Once | INTRAMUSCULAR | Status: AC
  Administered 2015-12-06: 4 mg via INTRAVENOUS
  Filled 2015-12-06: qty 2

## 2015-12-06 MED ORDER — MORPHINE SULFATE (PF) 2 MG/ML IV SOLN
2.0000 mg | Freq: Once | INTRAVENOUS | Status: AC
  Administered 2015-12-06: 2 mg via INTRAVENOUS
  Filled 2015-12-06: qty 1

## 2015-12-06 MED ORDER — DOCUSATE SODIUM 100 MG PO CAPS
100.0000 mg | ORAL_CAPSULE | Freq: Two times a day (BID) | ORAL | Status: DC
  Administered 2015-12-06 – 2015-12-07 (×3): 100 mg via ORAL
  Filled 2015-12-06 (×3): qty 1

## 2015-12-06 MED ORDER — POTASSIUM CHLORIDE CRYS ER 20 MEQ PO TBCR
20.0000 meq | EXTENDED_RELEASE_TABLET | Freq: Once | ORAL | Status: AC
  Administered 2015-12-06: 18:00:00 20 meq via ORAL
  Filled 2015-12-06: qty 1

## 2015-12-06 MED ORDER — ALPRAZOLAM 0.5 MG PO TABS
0.5000 mg | ORAL_TABLET | Freq: Two times a day (BID) | ORAL | Status: DC | PRN
  Administered 2015-12-06 – 2015-12-07 (×3): 0.5 mg via ORAL
  Filled 2015-12-06 (×3): qty 1

## 2015-12-06 MED ORDER — MONTELUKAST SODIUM 10 MG PO TABS
10.0000 mg | ORAL_TABLET | Freq: Every day | ORAL | Status: DC
  Administered 2015-12-06 – 2015-12-07 (×2): 10 mg via ORAL
  Filled 2015-12-06 (×2): qty 1

## 2015-12-06 MED ORDER — MOMETASONE FURO-FORMOTEROL FUM 100-5 MCG/ACT IN AERO
2.0000 | INHALATION_SPRAY | Freq: Two times a day (BID) | RESPIRATORY_TRACT | Status: DC
Start: 2015-12-06 — End: 2015-12-07
  Administered 2015-12-06 – 2015-12-07 (×3): 2 via RESPIRATORY_TRACT
  Filled 2015-12-06: qty 8.8

## 2015-12-06 MED ORDER — PREDNISONE 20 MG PO TABS: 20.0000 mg | ORAL_TABLET | Freq: Every day | ORAL | Status: DC

## 2015-12-06 MED ORDER — ATENOLOL 50 MG PO TABS
50.0000 mg | ORAL_TABLET | Freq: Every day | ORAL | Status: DC
  Administered 2015-12-06 – 2015-12-07 (×2): 50 mg via ORAL
  Filled 2015-12-06 (×2): qty 1

## 2015-12-06 MED ORDER — ENOXAPARIN SODIUM 40 MG/0.4ML ~~LOC~~ SOLN
40.0000 mg | SUBCUTANEOUS | Status: DC
  Administered 2015-12-06: 20:00:00 40 mg via SUBCUTANEOUS
  Filled 2015-12-06: qty 0.4

## 2015-12-06 MED ORDER — ACETAMINOPHEN 650 MG RE SUPP: 650.0000 mg | Freq: Four times a day (QID) | RECTAL | Status: DC | PRN

## 2015-12-06 MED ORDER — PREDNISONE 20 MG PO TABS: 30.0000 mg | ORAL_TABLET | Freq: Every day | ORAL | Status: DC

## 2015-12-06 MED ORDER — AZELASTINE HCL 0.1 % NA SOLN
1.0000 | Freq: Two times a day (BID) | NASAL | Status: DC
  Administered 2015-12-06 – 2015-12-07 (×3): 1 via NASAL
  Filled 2015-12-06: qty 30

## 2015-12-06 MED ORDER — TIOTROPIUM BROMIDE MONOHYDRATE 18 MCG IN CAPS
18.0000 ug | ORAL_CAPSULE | Freq: Every day | RESPIRATORY_TRACT | Status: DC
  Administered 2015-12-06 – 2015-12-07 (×2): 18 ug via RESPIRATORY_TRACT
  Filled 2015-12-06: qty 5

## 2015-12-06 MED ORDER — TIZANIDINE HCL 2 MG PO TABS
1.0000 mg | ORAL_TABLET | Freq: Every day | ORAL | Status: DC | PRN
  Administered 2015-12-06: 15:00:00 2 mg via ORAL
  Filled 2015-12-06 (×2): qty 1

## 2015-12-06 MED ORDER — HYDROCODONE-ACETAMINOPHEN 10-325 MG PO TABS
0.5000 | ORAL_TABLET | Freq: Three times a day (TID) | ORAL | Status: DC | PRN
  Administered 2015-12-06 – 2015-12-07 (×3): 1 via ORAL
  Filled 2015-12-06 (×3): qty 1

## 2015-12-06 MED ORDER — MORPHINE SULFATE (PF) 2 MG/ML IV SOLN: 2.0000 mg | INTRAVENOUS | Status: DC | PRN

## 2015-12-06 MED ORDER — ACETAMINOPHEN 325 MG PO TABS: 650.0000 mg | ORAL_TABLET | Freq: Four times a day (QID) | ORAL | Status: DC | PRN

## 2015-12-06 MED ORDER — SODIUM CHLORIDE 0.9 % IJ SOLN
3.0000 mL | Freq: Two times a day (BID) | INTRAMUSCULAR | Status: DC
  Administered 2015-12-06 – 2015-12-07 (×3): 3 mL via INTRAVENOUS

## 2015-12-06 MED ORDER — LEVOFLOXACIN 750 MG PO TABS
750.0000 mg | ORAL_TABLET | Freq: Every day | ORAL | Status: DC
  Administered 2015-12-06: 20:00:00 750 mg via ORAL
  Filled 2015-12-06: qty 1

## 2015-12-06 MED ORDER — HYDROXYCHLOROQUINE SULFATE 200 MG PO TABS
400.0000 mg | ORAL_TABLET | Freq: Every day | ORAL | Status: DC
  Administered 2015-12-06 – 2015-12-07 (×2): 400 mg via ORAL
  Filled 2015-12-06 (×2): qty 2

## 2015-12-06 MED ORDER — PREDNISONE 50 MG PO TABS
50.0000 mg | ORAL_TABLET | Freq: Every day | ORAL | Status: AC
  Administered 2015-12-07: 08:00:00 50 mg via ORAL
  Filled 2015-12-06: qty 1

## 2015-12-06 MED ORDER — ESCITALOPRAM OXALATE 10 MG PO TABS
10.0000 mg | ORAL_TABLET | Freq: Every day | ORAL | Status: DC
  Administered 2015-12-06 – 2015-12-07 (×2): 10 mg via ORAL
  Filled 2015-12-06 (×2): qty 1

## 2015-12-06 MED ORDER — HEPARIN SODIUM (PORCINE) 5000 UNIT/ML IJ SOLN
5000.0000 [IU] | Freq: Three times a day (TID) | INTRAMUSCULAR | Status: DC
  Administered 2015-12-06 (×2): 5000 [IU] via SUBCUTANEOUS
  Filled 2015-12-06 (×2): qty 1

## 2015-12-06 MED ORDER — VITAMIN D 1000 UNITS PO TABS
1000.0000 [IU] | ORAL_TABLET | Freq: Every day | ORAL | Status: DC
  Administered 2015-12-06 – 2015-12-07 (×2): 1000 [IU] via ORAL
  Filled 2015-12-06 (×2): qty 1

## 2015-12-06 MED ORDER — LOSARTAN POTASSIUM 50 MG PO TABS
50.0000 mg | ORAL_TABLET | Freq: Every day | ORAL | Status: DC
  Administered 2015-12-06 – 2015-12-07 (×2): 50 mg via ORAL
  Filled 2015-12-06 (×2): qty 1

## 2015-12-06 MED ORDER — BENZONATATE 100 MG PO CAPS: 200.0000 mg | ORAL_CAPSULE | Freq: Three times a day (TID) | ORAL | Status: DC | PRN

## 2015-12-06 MED ORDER — SODIUM CHLORIDE 0.9 % IV SOLN
INTRAVENOUS | Status: DC
  Administered 2015-12-06 (×2): via INTRAVENOUS

## 2015-12-06 MED ORDER — LEVOFLOXACIN 500 MG PO TABS: 500.0000 mg | ORAL_TABLET | Freq: Every day | ORAL | Status: DC

## 2015-12-06 MED ORDER — PREDNISONE 20 MG PO TABS: 40.0000 mg | ORAL_TABLET | Freq: Every day | ORAL | Status: DC

## 2015-12-06 MED ORDER — ONDANSETRON HCL 4 MG PO TABS: 4.0000 mg | ORAL_TABLET | Freq: Four times a day (QID) | ORAL | Status: DC | PRN

## 2015-12-06 MED ORDER — PREDNISONE 10 MG PO TABS: 10.0000 mg | ORAL_TABLET | Freq: Every day | ORAL | Status: DC

## 2015-12-06 MED ORDER — ONDANSETRON HCL 4 MG/2ML IJ SOLN: 4.0000 mg | Freq: Four times a day (QID) | INTRAMUSCULAR | Status: DC | PRN

## 2015-12-06 MED ORDER — BUDESONIDE-FORMOTEROL FUMARATE 160-4.5 MCG/ACT IN AERO: 2.0000 | INHALATION_SPRAY | Freq: Two times a day (BID) | RESPIRATORY_TRACT | Status: DC

## 2015-12-06 MED ORDER — PREDNISONE 10 MG PO TABS
60.0000 mg | ORAL_TABLET | Freq: Every day | ORAL | Status: AC
  Administered 2015-12-06: 10:00:00 60 mg via ORAL
  Filled 2015-12-06: qty 1

## 2015-12-06 MED ORDER — PREDNISONE 5 MG PO TABS: 5.0000 mg | ORAL_TABLET | Freq: Every day | ORAL | Status: DC

## 2015-12-06 NOTE — Progress Notes (Signed)
[place] at [place] NAME: Robin Hudson    MR#:  [id number]  DATE OF BIRTH:  Nov 17, 1941  SUBJECTIVE:  CHIEF COMPLAINT:   Chief Complaint  Patient presents with  . Shortness of Breath   Cough and shortness of breath are better. On O2 [place] 2 L. REVIEW OF SYSTEMS:  CONSTITUTIONAL: No fever, has weakness.  EYES: No blurred or double vision.  EARS, NOSE, AND THROAT: No tinnitus or ear pain.  RESPIRATORY: has cough, shortness of breath, no wheezing or hemoptysis.  CARDIOVASCULAR: No chest pain, orthopnea, edema.  GASTROINTESTINAL: No nausea, vomiting, diarrhea or abdominal pain.  GENITOURINARY: No dysuria, hematuria.  ENDOCRINE: No polyuria, nocturia,  HEMATOLOGY: No anemia, easy bruising or bleeding SKIN: No rash or lesion. MUSCULOSKELETAL: No joint pain or arthritis.   NEUROLOGIC: No tingling, numbness, weakness.  PSYCHIATRY: No anxiety or depression.   DRUG ALLERGIES:   Allergies  Allergen Reactions  . Coreg [Carvedilol] Other (See Comments)    syncope    VITALS:  Blood pressure 109/46, pulse 91, temperature 98.4 F (36.9 C), temperature source Oral, resp. rate 20, height '5\' 5"'$  (1.651 m), weight 58.151 kg (128 lb 3.2 oz), SpO2 93 %.  PHYSICAL EXAMINATION:  GENERAL:  75 y.o.-year-old patient lying in the bed with no acute distress.  EYES: Pupils equal, round, reactive to light and accommodation. No scleral icterus. Extraocular muscles intact.  HEENT: Head atraumatic, normocephalic. Oropharynx and nasopharynx clear.  NECK:  Supple, no jugular venous distention. No thyroid enlargement, no tenderness.  LUNGS: Normal breath sounds bilaterally, no wheezing, but has rhonchi. No use of accessory muscles of respiration.  CARDIOVASCULAR: S1, S2 normal. No murmurs, rubs, or gallops.  ABDOMEN: Soft, nontender, nondistended. Bowel sounds present. No organomegaly or mass.  EXTREMITIES: No pedal edema, cyanosis, or clubbing.  NEUROLOGIC:  Cranial nerves II through XII are intact. Muscle strength 5/5 in all extremities. Sensation intact. Gait not checked.  PSYCHIATRIC: The patient is alert and oriented x 3.  SKIN: No obvious rash, lesion, or ulcer.    LABORATORY PANEL:   CBC  Recent Labs Lab 12/05/15 2351  WBC 12.2*  HGB 11.3*  HCT 34.0*  PLT 229   ------------------------------------------------------------------------------------------------------------------  Chemistries   Recent Labs Lab 12/02/15 2322  12/05/15 2351  NA 129*  < > 136  K 4.0  < > 3.4*  CL 94*  < > 97*  CO2 26  < > 32  GLUCOSE 118*  < > 91  BUN 16  < > 16  CREATININE 0.84  < > 0.53  CALCIUM 9.1  < > 8.7*  AST 11*  --   --   ALT 9*  --   --   ALKPHOS 60  --   --   BILITOT 0.9  --   --   < > = values in this interval not displayed. ------------------------------------------------------------------------------------------------------------------  Cardiac Enzymes  Recent Labs Lab 12/05/15 2351  TROPONINI 0.03   ------------------------------------------------------------------------------------------------------------------  RADIOLOGY:  Dg Chest 2 View  12/06/2015  CLINICAL DATA:  74 year old female with cough and dyspnea EXAM: CHEST  2 VIEW COMPARISON:  Radiograph dated 12/02/2015 FINDINGS: Two views of the chest demonstrate emphysematous changes of the lungs with bibasilar subsegmental atelectasis/ scarring. Right apical pleural thickening/scarring. No focal consolidation, pleural effusion, new or pneumothorax. The cardiac silhouette is within normal limits. There is osteopenia with degenerative changes of the spine. No acute fracture. IMPRESSION: Emphysema.  No focal consolidation or pneumothorax. Electronically Signed  By: Anner Crete M.D.   On: 12/06/2015 00:25    EKG:   Orders placed or performed during the hospital encounter of 12/05/15  . EKG 12-Lead  . EKG 12-Lead  . ED EKG  . ED EKG    ASSESSMENT AND PLAN:    1. Acute on chronic respiratory failure with Hypoxemia. Continue oxygen [place]. The patient wears nocturnal oxygen at home. Continue Levaquin by mouth, taper prednisone.  2. COPD: Continue inhalers per home regimen 3. Essential hypertension: Continue atenolol and losartan 4. Depression: Continue Lexapro 5. Elevated ANA: Continue Plaquenil (Lupus ?) 6. Tobacco abuse: NicoDerm patch  Token anemia, give potassium supplement. PT consult.  All the records are reviewed and case discussed with Care Management/Social Workerr. Management plans discussed with the patient, family and they are in agreement.  CODE STATUS: Full code  TOTAL TIME TAKING CARE OF THIS PATIENT: 35 minutes.  Greater than 50% time was spent on coordination of care and face-to-face counseling.  POSSIBLE D/C IN 2 DAYS, DEPENDING ON CLINICAL CONDITION.   Demetrios Loll M.D on 12/06/2015 at 5:23 PM  Between 7am to 6pm - Pager - [phone]  After 6pm go to [email or web address] - password [email or web address] [facility]  [facility]  Office  [phone]  CC: Primary care physician; Adin Hector, MD

## 2015-12-06 NOTE — Evaluation (Signed)
Physical Therapy Evaluation Patient Details Name: Robin Hudson MRN: [id number] DOB: 09-May-1941 Today's Date: 12/06/2015   History of Present Illness  Patient is a 75 y/o female that presents with COPD exacerbation, PMHx: COPD, anxiety, B12 deficiency.   Clinical Impression  Patient presents with COPD exacerbation and is currently on contact and droplet precautions, thus examination was limited to her room. Patient is able to perform bed mobility and transfers independently with no loss of balance. She reports 1 fall about a year ago, no other falls reported. She is independent with ambulation at baseline, and appears to be at roughly her baseline (able to turn, stop, change speeds) with no loss of balance noted in this session. Skilled PT services are recommended while she is in the hospital to manage her cardiopulmonary deficits, however she appears to be at her baseline otherwise.     Follow Up Recommendations No PT follow up - pulmonary rehab if appropriate.     Equipment Recommendations       Recommendations for Other Services       Precautions / Restrictions Precautions Precautions: None Precaution Comments: MRSA and droplet  Restrictions Weight Bearing Restrictions: No      Mobility  Bed Mobility Overal bed mobility: Independent             General bed mobility comments: No deficits in bed mobility.   Transfers Overall transfer level: Independent Equipment used: None             General transfer comment: No deficits in speed, balance. 5x sit to stand in 14 seconds.   Ambulation/Gait Ambulation/Gait assistance: Independent Ambulation Distance (Feet): 120 Feet Assistive device: None Gait Pattern/deviations: WFL(Within Functional Limits)   Gait velocity interpretation: at or above normal speed for age/gender General Gait Details: No balance deficits in ambulation, she does not fatigue or lose balance with turns or backing to chair.   Stairs            Wheelchair Mobility    Modified Rankin (Stroke Patients Only)       Balance Overall balance assessment: No apparent balance deficits (not formally assessed)                                           Pertinent Vitals/Pain Pain Assessment:  (Does not report any pain in this session, reports she is feeling better.)    Home Living Family/patient expects to be discharged to:: Private residence Living Arrangements: Spouse/significant other;Children Available Help at Discharge: Family Type of Home: House Home Access: Stairs to enter   Technical brewer of Steps:  ("Just a ledge" ) Home Layout: One level        Prior Function Level of Independence: Independent               Hand Dominance        Extremity/Trunk Assessment   Upper Extremity Assessment: Overall WFL for tasks assessed           Lower Extremity Assessment: Overall WFL for tasks assessed         Communication   Communication: No difficulties  Cognition Arousal/Alertness: Awake/alert Behavior During Therapy: WFL for tasks assessed/performed Overall Cognitive Status: Within Functional Limits for tasks assessed                      General Comments      Exercises  Assessment/Plan    PT Assessment Patient needs continued PT services  PT Diagnosis Difficulty walking;Generalized weakness   PT Problem List Decreased strength;Cardiopulmonary status limiting activity  PT Treatment Interventions Gait training;Therapeutic exercise;Therapeutic activities;Stair training;Balance training   PT Goals (Current goals can be found in the Care Plan section) Acute Rehab PT Goals Patient Stated Goal: To return to home mobility.  PT Goal Formulation: With patient Time For Goal Achievement: 12/20/15 Potential to Achieve Goals: Good    Frequency Min 2X/week   Barriers to discharge        Co-evaluation               End of Session Equipment Utilized  During Treatment: Gait belt Activity Tolerance: Patient tolerated treatment well Patient left: in chair;with call bell/phone within reach (Mod Falls risk, alarm hooked up but not registering. RN notified.) Nurse Communication: Mobility status         Time: 2446-9507 PT Time Calculation (min) (ACUTE ONLY): 18 min   Charges:   PT Evaluation $PT Eval Low Complexity: 1 Procedure     PT G Codes:       Kerman Passey, PT, DPT    12/06/2015, 6:16 PM

## 2015-12-06 NOTE — H&P (Signed)
Robin Hudson is an 75 y.o. female.   Chief Complaint: Shortness of breath HPI: The patient presents emergency department complaining of shortness of breath. She was discharged from the hospital yesterday following treatment of a COPD exacerbation. Since that time she has been coughing up dark yellow to green colored sputum. In the hospital today she was found to be tachypneic and hypoxic to 88% on room air. She denies chest pain, fever, nausea or vomiting. Emergency department gave a dose of Solu-Medrol and started Levaquin before calling the hospitalist service for admission.  Past Medical History  Diagnosis Date  . COPD (chronic obstructive pulmonary disease) ([place])   . Hypertension   . Asthma   . CHF (congestive heart failure) ([place])   . Hyperlipemia   . B12 deficiency   . Osteoporosis   . Right adrenal mass ([place])   . Allergic rhinitis   . MRSA pneumonia ([place])   . Colon cancer ([place]) 2010  . Colon cancer ([place])   . Carcinoma of right breast treated with adjuvant chemotherapy ([employer] [employer]) 1999    RT LUMPECTOMY  . Breast cancer ([place]) 1999    RT LUMPECTOMY  . Radiation 1999    BREAST CA    Past Surgical History  Procedure Laterality Date  . Abdominal hysterectomy    . Appendectomy    . Colectomy      partial  . Image guided sinus surgery      Family History  Problem Relation Age of Onset  . Heart attack Mother   . Breast cancer Sister 61  . Breast cancer Sister 74  . Breast cancer Other    Social History:  reports that she has been smoking Cigarettes.  She has been smoking about 0.25 packs per day. She has never used smokeless tobacco. She reports that she does not drink alcohol or use illicit drugs.  Allergies:  Allergies  Allergen Reactions  . Coreg [Carvedilol] Other (See Comments)    syncope    Medications Prior to Admission  Medication Sig Dispense Refill  . ADVAIR DISKUS 250-50 MCG/DOSE AEPB Take 1 puff by mouth 2 (two) times daily.  4  . ALPRAZolam (XANAX) 0.5 MG  tablet Take 1 tablet by mouth 2 (two) times daily as needed for anxiety.   3  . atenolol (TENORMIN) 50 MG tablet Take 1 tablet (50 mg total) by mouth daily. 30 tablet 11  . azelastine (ASTELIN) 0.1 % nasal spray Place 1 spray into both nostrils 2 (two) times daily. Use in each nostril as directed    . benzonatate (TESSALON) 100 MG capsule Take 200 mg by mouth 3 (three) times daily as needed for cough.    . budesonide-formoterol (SYMBICORT) 160-4.5 MCG/ACT inhaler Inhale 2 puffs into the lungs 2 (two) times daily.    . cholecalciferol (VITAMIN D) 1000 units tablet Take 1,000 Units by mouth daily.    Marland Kitchen escitalopram (LEXAPRO) 10 MG tablet Take 1 tablet by mouth daily.  11  . HYDROcodone-acetaminophen (NORCO) 10-325 MG per tablet Take 0.5-1 tablets by mouth 3 (three) times daily as needed for moderate pain or severe pain.   0  . hydroxychloroquine (PLAQUENIL) 200 MG tablet Take 400 mg by mouth daily. Reported on 12/06/2015    . levofloxacin (LEVAQUIN) 750 MG tablet Take 1 tablet (750 mg total) by mouth daily. 5 tablet 0  . losartan (COZAAR) 50 MG tablet Take 1 tablet (50 mg total) by mouth daily. 30 tablet 11  . montelukast (SINGULAIR) 10 MG tablet  Take 1 tablet by mouth daily.  3  . predniSONE (DELTASONE) 5 MG tablet Take 1 tablet (5 mg total) by mouth daily with breakfast. Start taking this 5 mg prednisone tablet after completing prednisone tapering dose 30 tablet 0  . predniSONE (STERAPRED UNI-PAK 21 TAB) 10 MG (21) TBPK tablet Take 1 tablet (10 mg total) by mouth daily. Take 6 tablets by mouth for 1 day followed by  5 tablets by mouth for 1 day followed by  4 tablets by mouth for 1 day followed by  3 tablets by mouth for 1 day followed by  2 tablets by mouth for 1 day followed by  1 tablet by mouth for a day followed by 5 mg of prednisone by mouth once daily(home medication) 21 tablet 0  . PROAIR HFA 108 (90 BASE) MCG/ACT inhaler Take 2 puffs by mouth every 6 (six) hours as needed for wheezing.    12  . SPIRIVA HANDIHALER 18 MCG inhalation capsule Take 1 Inhaler by mouth daily.  11  . tiZANidine (ZANAFLEX) 2 MG tablet Take 1-2 mg by mouth daily as needed for muscle spasms.      Results for orders placed or performed during the hospital encounter of 12/05/15 (from the past 48 hour(s))  CBC with Differential     Status: Abnormal   Collection Time: 12/05/15 11:51 PM  Result Value Ref Range   WBC 12.2 (H) 3.6 - 11.0 K/uL   RBC 4.12 3.80 - 5.20 MIL/uL   Hemoglobin 11.3 (L) 12.0 - 16.0 g/dL   HCT 01.6 (L) 58.0 - 06.3 %   MCV 82.4 80.0 - 100.0 fL   MCH 27.3 26.0 - 34.0 pg   MCHC 33.1 32.0 - 36.0 g/dL   RDW 49.4 94.4 - 73.9 %   Platelets 229 150 - 440 K/uL   Neutrophils Relative % 82 %   Neutro Abs 10.1 (H) 1.4 - 6.5 K/uL   Lymphocytes Relative 8 %   Lymphs Abs 0.9 (L) 1.0 - 3.6 K/uL   Monocytes Relative 10 %   Monocytes Absolute 1.2 (H) 0.2 - 0.9 K/uL   Eosinophils Relative 0 %   Eosinophils Absolute 0.0 0 - 0.7 K/uL   Basophils Relative 0 %   Basophils Absolute 0.0 0 - 0.1 K/uL  Basic metabolic panel     Status: Abnormal   Collection Time: 12/05/15 11:51 PM  Result Value Ref Range   Sodium 136 135 - 145 mmol/L   Potassium 3.4 (L) 3.5 - 5.1 mmol/L   Chloride 97 (L) 101 - 111 mmol/L   CO2 32 22 - 32 mmol/L   Glucose, Bld 91 65 - 99 mg/dL   BUN 16 6 - 20 mg/dL   Creatinine, Ser 5.84 0.44 - 1.00 mg/dL   Calcium 8.7 (L) 8.9 - 10.3 mg/dL   GFR calc non Af Amer >60 >60 mL/min   GFR calc Af Amer >60 >60 mL/min    Comment: (NOTE) The eGFR has been calculated using the CKD EPI equation. This calculation has not been validated in all clinical situations. eGFR's persistently <60 mL/min signify possible Chronic Kidney Disease.    Anion gap 7 5 - 15  Brain natriuretic peptide     Status: Abnormal   Collection Time: 12/05/15 11:51 PM  Result Value Ref Range   B Natriuretic Peptide 753.0 (H) 0.0 - 100.0 pg/mL  Troponin I     Status: None   Collection Time: 12/05/15 11:51 PM   Result Value Ref Range  Troponin I 0.03 <0.031 ng/mL    Comment:        NO INDICATION OF MYOCARDIAL INJURY.    Dg Chest 2 View  12/06/2015  CLINICAL DATA:  75 year old female with cough and dyspnea EXAM: CHEST  2 VIEW COMPARISON:  Radiograph dated 12/02/2015 FINDINGS: Two views of the chest demonstrate emphysematous changes of the lungs with bibasilar subsegmental atelectasis/ scarring. Right apical pleural thickening/scarring. No focal consolidation, pleural effusion, new or pneumothorax. The cardiac silhouette is within normal limits. There is osteopenia with degenerative changes of the spine. No acute fracture. IMPRESSION: Emphysema.  No focal consolidation or pneumothorax. Electronically Signed   By: Anner Crete M.D.   On: 12/06/2015 00:25    Review of Systems  Constitutional: Negative for fever and chills.  HENT: Negative for sore throat and tinnitus.   Eyes: Negative for blurred vision and redness.  Respiratory: Positive for cough and shortness of breath.   Cardiovascular: Negative for chest pain, palpitations, orthopnea and PND.  Gastrointestinal: Negative for nausea, vomiting, abdominal pain and diarrhea.  Genitourinary: Negative for dysuria, urgency and frequency.  Musculoskeletal: Negative for myalgias and joint pain.  Skin: Negative for rash.       No lesions  Neurological: Negative for speech change, focal weakness and weakness.  Endo/Heme/Allergies: Does not bruise/bleed easily.       No temperature intolerance  Psychiatric/Behavioral: Negative for depression and suicidal ideas.    Blood pressure 120/44, pulse 89, temperature 98 F (36.7 C), temperature source Oral, resp. rate 18, height '5\' 5"'$  (1.651 m), weight 58.968 kg (130 lb), SpO2 96 %. Physical Exam  Nursing note and vitals reviewed. Constitutional: She is oriented to person, place, and time. She appears well-developed and well-nourished.  HENT:  Head: Normocephalic and atraumatic.  Mouth/Throat:  Oropharynx is clear and moist.  Eyes: Conjunctivae and EOM are normal. Pupils are equal, round, and reactive to light. No scleral icterus.  Neck: Normal range of motion. Neck supple. No JVD present. No tracheal deviation present. No thyromegaly present.  Cardiovascular: Normal rate, regular rhythm and normal heart sounds.  Exam reveals no gallop and no friction rub.   No murmur heard. Respiratory: Effort normal and breath sounds normal.  GI: Soft. Bowel sounds are normal. She exhibits no distension. There is no tenderness.  Genitourinary:  Deferred  Musculoskeletal: Normal range of motion. She exhibits no edema.  Lymphadenopathy:    She has no cervical adenopathy.  Neurological: She is alert and oriented to person, place, and time. No cranial nerve deficit. She exhibits normal muscle tone.  Skin: Skin is warm and dry. No rash noted. No erythema.  Psychiatric: She has a normal mood and affect. Her behavior is normal. Judgment and thought content normal.     Assessment/Plan This is a 75 year old female with COPD readmitted for acute on chronic respiratory failure with hypoxemia. 1. Acute on chronic respiratory failure: Hypoxemia present. Supplement oxygen as needed. The patient wears nocturnal oxygen at home. Continue Levaquin by mouth as the patient is not septic. I started a steroid taper.  2. COPD: Continue inhalers per home regimen 3. Essential hypertension: Continue atenolol and losartan 4. Depression: Continue Lexapro 5. Elevated ANA: Continue Plaquenil (Lupus ?) 6. Tobacco abuse: NicoDerm patch while hospitalized 7. DVT prophylaxis: Heparin 8. GI prophylaxis: None The patient is a full code. Time spent on admission orders and patient care approximately 45 minutes   Harrie Foreman 12/06/2015, 2:00 AM

## 2015-12-06 NOTE — Care Management (Signed)
Admitted to [facility] with the diagnosis of hypoxia. Discharged from this facility 12/04/15. Lives with husband, Gwenlyn Perking [phone]).  Home oxygen thru [employer] x 2-3 years. Uses oxygen as needed in the home. No skilled facility. Cane and rolling available, if needed.  Home Health provided by insurance company for yearly physical. Takes care of both basic and instrumental activities of daily living herself, drives. No falls. Decreased appetite x 1 week. No seizures . Last cigarette Monday or Tuesday of last week. Husband will transport.  States she is feeling much better. Shelbie Ammons RN MSN CCM Care Management [phone]

## 2015-12-06 NOTE — Plan of Care (Signed)
Problem: Pain Managment: Goal: General experience of comfort will improve Outcome: Progressing Pt admitted from the ED. She had already got a total of 4 mg administered in the ED with complaints of back pain which is chronic. Upon arrival patient still rated her pain a 8/10 and requested Norco. Norco administered as ordered without much relief per patient. Pt declined further interventions except she did ask for Xanax and also for soft music to be played.  Pt verbalized that her anxiety improved. Will continue to monitor.   Problem: Activity: Goal: Risk for activity intolerance will decrease Outcome: Progressing Up with standby assist to BR to void. Moderate fall risk.   Problem: Nutrition: Goal: Adequate nutrition will be maintained Outcome: Progressing Pt without recent weight loss but did say that her appetite has been poor during initial assessment.

## 2015-12-07 DIAGNOSIS — F329 Major depressive disorder, single episode, unspecified: Secondary | ICD-10-CM | POA: Diagnosis not present

## 2015-12-07 DIAGNOSIS — Z720 Tobacco use: Secondary | ICD-10-CM | POA: Diagnosis not present

## 2015-12-07 DIAGNOSIS — I10 Essential (primary) hypertension: Secondary | ICD-10-CM | POA: Diagnosis not present

## 2015-12-07 DIAGNOSIS — J449 Chronic obstructive pulmonary disease, unspecified: Secondary | ICD-10-CM | POA: Diagnosis not present

## 2015-12-07 DIAGNOSIS — J9621 Acute and chronic respiratory failure with hypoxia: Secondary | ICD-10-CM | POA: Diagnosis not present

## 2015-12-07 LAB — CULTURE, BLOOD (ROUTINE X 2)
CULTURE: NO GROWTH
CULTURE: NO GROWTH
Culture: NO GROWTH
Culture: NO GROWTH

## 2015-12-07 LAB — BASIC METABOLIC PANEL
ANION GAP: 7 (ref 5–15)
BUN: 15 mg/dL (ref 6–20)
CALCIUM: 8.9 mg/dL (ref 8.9–10.3)
CO2: 34 mmol/L — AB (ref 22–32)
CREATININE: 0.73 mg/dL (ref 0.44–1.00)
Chloride: 99 mmol/L — ABNORMAL LOW (ref 101–111)
GFR calc Af Amer: >60 mL/min (ref >60)
GLUCOSE: 103 mg/dL — AB (ref 65–99)
Potassium: 4.4 mmol/L (ref 3.5–5.1)
Sodium: 140 mmol/L (ref 135–145)

## 2015-12-07 LAB — MAGNESIUM: Magnesium: 1.9 mg/dL (ref 1.7–2.4)

## 2015-12-07 NOTE — Progress Notes (Signed)
Pt being discharged today, discharge instructions reviewed with patient, she verbalized understanding and signed for them. IV removed, belongings packed and given to pt, she was rolled out in wheelchair by staff.

## 2015-12-07 NOTE — Discharge Instructions (Signed)
Heart healthy diet. Activity as tolerated. Continue home O2 [place].

## 2015-12-07 NOTE — Plan of Care (Signed)
Problem: Pain Managment: Goal: General experience of comfort will improve Outcome: Progressing No complaints of pain this shift.  Problem: Activity: Goal: Risk for activity intolerance will decrease Outcome: Progressing Ambulates in room. Continues on 02 per [place], no respiratory distress noted.

## 2015-12-07 NOTE — Discharge Summary (Addendum)
[facility] [place] NAME: Robin Hudson    MR#:  [id number]  DATE OF BIRTH:  October 02, 1941  DATE OF ADMISSION:  12/05/2015 ADMITTING PHYSICIAN: Harrie Foreman, MD  DATE OF DISCHARGE: 12/07/2015 12:33 PM  PRIMARY CARE PHYSICIAN: Tama High III, MD    ADMISSION DIAGNOSIS:  COPD exacerbation ([place]) [J44.1]   DISCHARGE DIAGNOSIS:   Acute on chronic respiratory failure with Hypoxemia SECONDARY DIAGNOSIS:   Past Medical History  Diagnosis Date  . COPD (chronic obstructive pulmonary disease) ([place])   . Hypertension   . Asthma   . CHF (congestive heart failure) ([place])   . Hyperlipemia   . B12 deficiency   . Osteoporosis   . Right adrenal mass ([place])   . Allergic rhinitis   . MRSA pneumonia ([place])   . Colon cancer ([place]) 2010  . Colon cancer ([place])   . Carcinoma of right breast treated with adjuvant chemotherapy ([place]) 1999    RT LUMPECTOMY  . Breast cancer ([place]) 1999    RT LUMPECTOMY  . Radiation 1999    BREAST CA    HOSPITAL COURSE:   1. Acute on chronic respiratory failure with Hypoxemia. Continue oxygen [place]. The patient wears nocturnal oxygen at home. Treated with Levaquin by mouth, taper prednisone. Her symptoms has much improved.  2. COPD: Continue inhalers per home regimen 3. Essential hypertension: Continue atenolol and losartan 4. Depression: Continue Lexapro 5. Elevated ANA: Continue Plaquenil (Lupus ?) 6. Tobacco abuse: NicoDerm patch, smoking cessation was counseled for 3 minutes. DISCHARGE CONDITIONS:   Stable, discharged to home today.  CONSULTS OBTAINED:     DRUG ALLERGIES:   Allergies  Allergen Reactions  . Coreg [Carvedilol] Other (See Comments)    syncope    DISCHARGE MEDICATIONS:   Discharge Medication List as of 12/07/2015 11:03 AM    CONTINUE these medications which have NOT CHANGED   Details  ADVAIR DISKUS 250-50 MCG/DOSE AEPB Take 1 puff by mouth 2 (two) times daily., Starting 04/18/2015,  Until Discontinued, Historical Med    ALPRAZolam (XANAX) 0.5 MG tablet Take 1 tablet by mouth 2 (two) times daily as needed for anxiety. , Starting 04/19/2015, Until Discontinued, Historical Med    atenolol (TENORMIN) 50 MG tablet Take 1 tablet (50 mg total) by mouth daily., Starting 05/18/2015, Until Discontinued, Normal    azelastine (ASTELIN) 0.1 % nasal spray Place 1 spray into both nostrils 2 (two) times daily. Use in each nostril as directed, Until Discontinued, Historical Med    benzonatate (TESSALON) 100 MG capsule Take 200 mg by mouth 3 (three) times daily as needed for cough., Until Discontinued, Historical Med    budesonide-formoterol (SYMBICORT) 160-4.5 MCG/ACT inhaler Inhale 2 puffs into the lungs 2 (two) times daily., Until Discontinued, Historical Med    cholecalciferol (VITAMIN D) 1000 units tablet Take 1,000 Units by mouth daily., Until Discontinued, Historical Med    escitalopram (LEXAPRO) 10 MG tablet Take 1 tablet by mouth daily., Starting 04/12/2015, Until Discontinued, Historical Med    HYDROcodone-acetaminophen (NORCO) 10-325 MG per tablet Take 0.5-1 tablets by mouth 3 (three) times daily as needed for moderate pain or severe pain. , Starting 04/28/2015, Until Discontinued, Historical Med    hydroxychloroquine (PLAQUENIL) 200 MG tablet Take 400 mg by mouth daily. Reported on 12/06/2015, Until Discontinued, Historical Med    losartan (COZAAR) 50 MG tablet Take 1 tablet (50 mg total) by mouth daily., Starting 05/18/2015, Until Discontinued, Normal    montelukast (SINGULAIR) 10  MG tablet Take 1 tablet by mouth daily., Starting 03/17/2015, Until Discontinued, Historical Med    predniSONE (DELTASONE) 5 MG tablet Take 1 tablet (5 mg total) by mouth daily with breakfast. Start taking this 5 mg prednisone tablet after completing prednisone tapering dose, Starting 12/04/2015, Until Discontinued, Print    predniSONE (STERAPRED UNI-PAK 21 TAB) 10 MG (21) TBPK tablet Take 1 tablet (10 mg  total) by mouth daily. Take 6 tablets by mouth for 1 day followed by  5 tablets by mouth for 1 day followed by  4 tablets by mouth for 1 day followed by  3 tablets by mouth for 1 day followed by  2 tablets by mouth for 1 day foll owed by  1 tablet by mouth for a day followed by 5 mg of prednisone by mouth once daily(home medication), Starting 12/04/2015, Until Discontinued, Print    PROAIR HFA 108 (90 BASE) MCG/ACT inhaler Take 2 puffs by mouth every 6 (six) hours as needed for wheezing. , Starting 04/12/2015, Until Discontinued, Historical Med    SPIRIVA HANDIHALER 18 MCG inhalation capsule Take 1 Inhaler by mouth daily., Starting 04/24/2015, Until Discontinued, Historical Med    tiZANidine (ZANAFLEX) 2 MG tablet Take 1-2 mg by mouth daily as needed for muscle spasms., Until Discontinued, Historical Med    levofloxacin (LEVAQUIN) 750 MG tablet Take 1 tablet (750 mg total) by mouth daily., Starting 12/04/2015, Until Discontinued, Print         DISCHARGE INSTRUCTIONS:    If you experience worsening of your admission symptoms, develop shortness of breath, life threatening emergency, suicidal or homicidal thoughts you must seek medical attention immediately by calling 911 or calling your MD immediately  if symptoms less severe.  You Must read complete instructions/literature along with all the possible adverse reactions/side effects for all the Medicines you take and that have been prescribed to you. Take any new Medicines after you have completely understood and accept all the possible adverse reactions/side effects.   Please note  You were cared for by a hospitalist during your hospital stay. If you have any questions about your discharge medications or the care you received while you were in the hospital after you are discharged, you can call the unit and asked to speak with the hospitalist on call if the hospitalist that took care of you is not available. Once you are discharged, your  primary care physician will handle any further medical issues. Please note that NO REFILLS for any discharge medications will be authorized once you are discharged, as it is imperative that you return to your primary care physician (or establish a relationship with a primary care physician if you do not have one) for your aftercare needs so that they can reassess your need for medications and monitor your lab values.    Today   SUBJECTIVE   No complaint.   VITAL SIGNS:  Blood pressure 152/55, pulse 57, temperature 97.5 F (36.4 C), temperature source Oral, resp. rate 16, height '5\' 5"'$  (1.651 m), weight 61.281 kg (135 lb 1.6 oz), SpO2 95 %.  I/O:  No intake or output data in the 24 hours ending 12/07/15 1811  PHYSICAL EXAMINATION:  GENERAL:  75 y.o.-year-old patient lying in the bed with no acute distress.  EYES: Pupils equal, round, reactive to light and accommodation. No scleral icterus. Extraocular muscles intact.  HEENT: Head atraumatic, normocephalic. Oropharynx and nasopharynx clear.  NECK:  Supple, no jugular venous distention. No thyroid enlargement, no tenderness.  LUNGS: Normal  breath sounds bilaterally, no wheezing, rales,rhonchi or crepitation. No use of accessory muscles of respiration.  CARDIOVASCULAR: S1, S2 normal. No murmurs, rubs, or gallops.  ABDOMEN: Soft, non-tender, non-distended. Bowel sounds present. No organomegaly or mass.  EXTREMITIES: No pedal edema, cyanosis, or clubbing.  NEUROLOGIC: Cranial nerves II through XII are intact. Muscle strength 5/5 in all extremities. Sensation intact. Gait not checked.  PSYCHIATRIC: The patient is alert and oriented x 3.  SKIN: No obvious rash, lesion, or ulcer.   DATA REVIEW:   CBC  Recent Labs Lab 12/05/15 2351  WBC 12.2*  HGB 11.3*  HCT 34.0*  PLT 229    Chemistries   Recent Labs Lab 12/02/15 2322  12/07/15 0423  NA 129*  < > 140  K 4.0  < > 4.4  CL 94*  < > 99*  CO2 26  < > 34*  GLUCOSE 118*  < > 103*   BUN 16  < > 15  CREATININE 0.84  < > 0.73  CALCIUM 9.1  < > 8.9  MG  --   --  1.9  AST 11*  --   --   ALT 9*  --   --   ALKPHOS 60  --   --   BILITOT 0.9  --   --   < > = values in this interval not displayed.  Cardiac Enzymes  Recent Labs Lab 12/05/15 2351  TROPONINI 0.03    Microbiology Results  Results for orders placed or performed during the hospital encounter of 12/05/15  Blood culture (routine x 2)     Status: None (Preliminary result)   Collection Time: 12/05/15 11:56 PM  Result Value Ref Range Status   Specimen Description BLOOD BLOOD RIGHT FOREARM  Final   Special Requests   Final    BOTTLES DRAWN AEROBIC AND ANAEROBIC 5MLANAEROBIC,10MLAEROBIC   Culture NO GROWTH 1 DAY  Final   Report Status PENDING  Incomplete  Blood culture (routine x 2)     Status: None (Preliminary result)   Collection Time: 12/06/15 12:21 AM  Result Value Ref Range Status   Specimen Description BLOOD BLOOD LEFT FOREARM  Final   Special Requests   Final    BOTTLES DRAWN AEROBIC AND ANAEROBIC 5MLANAEROBIC,10MLAEROBIC   Culture NO GROWTH 1 DAY  Final   Report Status PENDING  Incomplete    RADIOLOGY:  Dg Chest 2 View  12/06/2015  CLINICAL DATA:  75 year old female with cough and dyspnea EXAM: CHEST  2 VIEW COMPARISON:  Radiograph dated 12/02/2015 FINDINGS: Two views of the chest demonstrate emphysematous changes of the lungs with bibasilar subsegmental atelectasis/ scarring. Right apical pleural thickening/scarring. No focal consolidation, pleural effusion, new or pneumothorax. The cardiac silhouette is within normal limits. There is osteopenia with degenerative changes of the spine. No acute fracture. IMPRESSION: Emphysema.  No focal consolidation or pneumothorax. Electronically Signed   By: Anner Crete M.D.   On: 12/06/2015 00:25        Management plans discussed with the patient, her husband and they are in agreement.  CODE STATUS:  Code Status History    Date Active Date  Inactive Code Status Order ID Comments User Context   12/06/2015  2:00 AM 12/07/2015  3:34 PM Full Code [id number]  Harrie Foreman, MD Inpatient   12/03/2015 12:49 AM 12/04/2015  5:21 PM Full Code [id number]  Lytle Butte, MD ED   05/12/2015  3:09 PM 05/18/2015 12:39 PM Full Code [id number]  Dustin Flock, MD Inpatient  TOTAL TIME TAKING CARE OF THIS PATIENT: 35 minutes.    Demetrios Loll M.D on 12/07/2015 at 6:11 PM  Between 7am to 6pm - Pager - [phone]  After 6pm go to [email or web address] - password [email or web address] [facility]  [facility]  Office  [phone]  CC: Primary care physician; Adin Hector, MD

## 2015-12-11 LAB — CULTURE, BLOOD (ROUTINE X 2)
CULTURE: NO GROWTH
Culture: NO GROWTH

## 2015-12-12 DIAGNOSIS — E538 Deficiency of other specified B group vitamins: Secondary | ICD-10-CM | POA: Diagnosis not present

## 2015-12-12 DIAGNOSIS — E279 Disorder of adrenal gland, unspecified: Secondary | ICD-10-CM | POA: Diagnosis not present

## 2015-12-12 DIAGNOSIS — J438 Other emphysema: Secondary | ICD-10-CM | POA: Diagnosis not present

## 2015-12-12 DIAGNOSIS — I10 Essential (primary) hypertension: Secondary | ICD-10-CM | POA: Diagnosis not present

## 2015-12-12 DIAGNOSIS — I2510 Atherosclerotic heart disease of native coronary artery without angina pectoris: Secondary | ICD-10-CM | POA: Diagnosis not present

## 2015-12-18 DIAGNOSIS — J439 Emphysema, unspecified: Secondary | ICD-10-CM | POA: Diagnosis not present

## 2015-12-18 DIAGNOSIS — R0602 Shortness of breath: Secondary | ICD-10-CM | POA: Diagnosis not present

## 2016-01-05 DIAGNOSIS — J449 Chronic obstructive pulmonary disease, unspecified: Secondary | ICD-10-CM | POA: Diagnosis not present

## 2016-01-10 DIAGNOSIS — L538 Other specified erythematous conditions: Secondary | ICD-10-CM | POA: Diagnosis not present

## 2016-01-10 DIAGNOSIS — D485 Neoplasm of uncertain behavior of skin: Secondary | ICD-10-CM | POA: Diagnosis not present

## 2016-01-22 ENCOUNTER — Encounter: Payer: Self-pay | Admitting: *Deleted

## 2016-01-22 ENCOUNTER — Other Ambulatory Visit: Payer: Self-pay | Admitting: *Deleted

## 2016-01-22 NOTE — Patient Outreach (Signed)
[facility]) Care Management  01/22/2016  Robin Hudson 1941/05/02 [id number]   Subjective: Telephone call to patient's home number, spoke with patient, and HIPAA verified.   Discussed [facility] Care Management services and patient declined services.   States she only can talk for a few minutes.   States her sister is a retired Building services engineer and able to meet all of her educational needs if anything needed.   States she has received [facility] program information in the past.  States she can afford medications, has no transportation issues and has no Data processing manager needs. Patient states she will not go back to [facility] for future inpatient visits and prefers to go to [facility] if needed.  Patient states she does not have any care managements needs at this time.  Objective: Per Epic case review:  Patient refused [place] Management services on 07/27/15.   Patient has a history of COPD, hypertension, heart failure, and colon cancer.    Patient was hospitalized 12/05/15 - 12/07/15 for COPD exacerbation.  Assessment:  Received HTA Tier 4 list referral on 01/08/16.   2 Admissions and 6 ER visits.   Diagnosis COPD.  Plan: RNCM will send primary MD case closure letter due to refusal / no care management needs. RNCM will send request for case closure due to refusal / no care management needs to [facility] Management.   [name], BSN, [facility] Telephonic CM Phone: [phone] Fax: [phone]

## 2016-01-29 DIAGNOSIS — B9689 Other specified bacterial agents as the cause of diseases classified elsewhere: Secondary | ICD-10-CM | POA: Diagnosis not present

## 2016-01-29 DIAGNOSIS — M542 Cervicalgia: Secondary | ICD-10-CM | POA: Diagnosis not present

## 2016-01-29 DIAGNOSIS — J019 Acute sinusitis, unspecified: Secondary | ICD-10-CM | POA: Diagnosis not present

## 2016-02-02 DIAGNOSIS — J449 Chronic obstructive pulmonary disease, unspecified: Secondary | ICD-10-CM | POA: Diagnosis not present

## 2016-02-05 DIAGNOSIS — M545 Low back pain: Secondary | ICD-10-CM | POA: Diagnosis not present

## 2016-02-05 DIAGNOSIS — G8929 Other chronic pain: Secondary | ICD-10-CM | POA: Diagnosis not present

## 2016-02-05 DIAGNOSIS — M542 Cervicalgia: Secondary | ICD-10-CM | POA: Diagnosis not present

## 2016-02-05 DIAGNOSIS — M059 Rheumatoid arthritis with rheumatoid factor, unspecified: Secondary | ICD-10-CM | POA: Diagnosis not present

## 2016-02-07 DIAGNOSIS — J449 Chronic obstructive pulmonary disease, unspecified: Secondary | ICD-10-CM | POA: Diagnosis not present

## 2016-02-07 DIAGNOSIS — I119 Hypertensive heart disease without heart failure: Secondary | ICD-10-CM | POA: Diagnosis not present

## 2016-02-07 DIAGNOSIS — I10 Essential (primary) hypertension: Secondary | ICD-10-CM | POA: Diagnosis not present

## 2016-02-07 DIAGNOSIS — E782 Mixed hyperlipidemia: Secondary | ICD-10-CM | POA: Diagnosis not present

## 2016-02-07 DIAGNOSIS — I2510 Atherosclerotic heart disease of native coronary artery without angina pectoris: Secondary | ICD-10-CM | POA: Diagnosis not present

## 2016-02-16 DIAGNOSIS — I10 Essential (primary) hypertension: Secondary | ICD-10-CM | POA: Diagnosis not present

## 2016-02-16 DIAGNOSIS — E538 Deficiency of other specified B group vitamins: Secondary | ICD-10-CM | POA: Diagnosis not present

## 2016-02-22 ENCOUNTER — Other Ambulatory Visit: Payer: Self-pay | Admitting: Internal Medicine

## 2016-02-22 DIAGNOSIS — I2510 Atherosclerotic heart disease of native coronary artery without angina pectoris: Secondary | ICD-10-CM | POA: Diagnosis not present

## 2016-02-22 DIAGNOSIS — D518 Other vitamin B12 deficiency anemias: Secondary | ICD-10-CM | POA: Diagnosis not present

## 2016-02-22 DIAGNOSIS — E279 Disorder of adrenal gland, unspecified: Secondary | ICD-10-CM | POA: Diagnosis not present

## 2016-02-22 DIAGNOSIS — Z780 Asymptomatic menopausal state: Secondary | ICD-10-CM | POA: Diagnosis not present

## 2016-02-22 DIAGNOSIS — J438 Other emphysema: Secondary | ICD-10-CM | POA: Diagnosis not present

## 2016-02-22 DIAGNOSIS — E782 Mixed hyperlipidemia: Secondary | ICD-10-CM | POA: Diagnosis not present

## 2016-02-22 DIAGNOSIS — I119 Hypertensive heart disease without heart failure: Secondary | ICD-10-CM | POA: Diagnosis not present

## 2016-02-22 DIAGNOSIS — I10 Essential (primary) hypertension: Secondary | ICD-10-CM | POA: Diagnosis not present

## 2016-02-22 DIAGNOSIS — M5136 Other intervertebral disc degeneration, lumbar region: Secondary | ICD-10-CM | POA: Diagnosis not present

## 2016-02-22 DIAGNOSIS — M810 Age-related osteoporosis without current pathological fracture: Secondary | ICD-10-CM | POA: Diagnosis not present

## 2016-02-22 DIAGNOSIS — Z1231 Encounter for screening mammogram for malignant neoplasm of breast: Secondary | ICD-10-CM

## 2016-02-22 DIAGNOSIS — E538 Deficiency of other specified B group vitamins: Secondary | ICD-10-CM | POA: Diagnosis not present

## 2016-02-22 DIAGNOSIS — Z1239 Encounter for other screening for malignant neoplasm of breast: Secondary | ICD-10-CM | POA: Diagnosis not present

## 2016-02-26 ENCOUNTER — Ambulatory Visit: Payer: PPO | Attending: Specialist

## 2016-03-04 DIAGNOSIS — J449 Chronic obstructive pulmonary disease, unspecified: Secondary | ICD-10-CM | POA: Diagnosis not present

## 2016-03-04 DIAGNOSIS — M5417 Radiculopathy, lumbosacral region: Secondary | ICD-10-CM | POA: Diagnosis not present

## 2016-03-04 DIAGNOSIS — M4726 Other spondylosis with radiculopathy, lumbar region: Secondary | ICD-10-CM | POA: Diagnosis not present

## 2016-03-04 DIAGNOSIS — M62838 Other muscle spasm: Secondary | ICD-10-CM | POA: Diagnosis not present

## 2016-03-04 DIAGNOSIS — M47812 Spondylosis without myelopathy or radiculopathy, cervical region: Secondary | ICD-10-CM | POA: Diagnosis not present

## 2016-03-26 DIAGNOSIS — E538 Deficiency of other specified B group vitamins: Secondary | ICD-10-CM | POA: Diagnosis not present

## 2016-04-03 DIAGNOSIS — J449 Chronic obstructive pulmonary disease, unspecified: Secondary | ICD-10-CM | POA: Diagnosis not present

## 2016-04-25 DIAGNOSIS — S52502A Unspecified fracture of the lower end of left radius, initial encounter for closed fracture: Secondary | ICD-10-CM | POA: Diagnosis not present

## 2016-04-25 DIAGNOSIS — S52592A Other fractures of lower end of left radius, initial encounter for closed fracture: Secondary | ICD-10-CM | POA: Diagnosis not present

## 2016-04-25 DIAGNOSIS — S6992XA Unspecified injury of left wrist, hand and finger(s), initial encounter: Secondary | ICD-10-CM | POA: Diagnosis not present

## 2016-04-25 DIAGNOSIS — W19XXXA Unspecified fall, initial encounter: Secondary | ICD-10-CM | POA: Diagnosis not present

## 2016-04-26 DIAGNOSIS — S52532A Colles' fracture of left radius, initial encounter for closed fracture: Secondary | ICD-10-CM | POA: Diagnosis not present

## 2016-04-26 DIAGNOSIS — E538 Deficiency of other specified B group vitamins: Secondary | ICD-10-CM | POA: Diagnosis not present

## 2016-05-01 DIAGNOSIS — S52532D Colles' fracture of left radius, subsequent encounter for closed fracture with routine healing: Secondary | ICD-10-CM | POA: Diagnosis not present

## 2016-05-01 DIAGNOSIS — M79602 Pain in left arm: Secondary | ICD-10-CM | POA: Diagnosis not present

## 2016-05-04 DIAGNOSIS — J449 Chronic obstructive pulmonary disease, unspecified: Secondary | ICD-10-CM | POA: Diagnosis not present

## 2016-05-07 DIAGNOSIS — J0100 Acute maxillary sinusitis, unspecified: Secondary | ICD-10-CM | POA: Diagnosis not present

## 2016-05-10 DIAGNOSIS — S52532D Colles' fracture of left radius, subsequent encounter for closed fracture with routine healing: Secondary | ICD-10-CM | POA: Diagnosis not present

## 2016-05-27 DIAGNOSIS — E538 Deficiency of other specified B group vitamins: Secondary | ICD-10-CM | POA: Diagnosis not present

## 2016-05-28 DIAGNOSIS — S52532D Colles' fracture of left radius, subsequent encounter for closed fracture with routine healing: Secondary | ICD-10-CM | POA: Diagnosis not present

## 2016-05-28 DIAGNOSIS — S52532S Colles' fracture of left radius, sequela: Secondary | ICD-10-CM | POA: Diagnosis not present

## 2016-05-29 DIAGNOSIS — R0789 Other chest pain: Secondary | ICD-10-CM | POA: Diagnosis not present

## 2016-05-29 DIAGNOSIS — I119 Hypertensive heart disease without heart failure: Secondary | ICD-10-CM | POA: Diagnosis not present

## 2016-05-29 DIAGNOSIS — I43 Cardiomyopathy in diseases classified elsewhere: Secondary | ICD-10-CM | POA: Diagnosis not present

## 2016-05-29 DIAGNOSIS — E782 Mixed hyperlipidemia: Secondary | ICD-10-CM | POA: Diagnosis not present

## 2016-05-29 DIAGNOSIS — I10 Essential (primary) hypertension: Secondary | ICD-10-CM | POA: Diagnosis not present

## 2016-05-29 DIAGNOSIS — J438 Other emphysema: Secondary | ICD-10-CM | POA: Diagnosis not present

## 2016-05-30 DIAGNOSIS — R0789 Other chest pain: Secondary | ICD-10-CM | POA: Diagnosis not present

## 2016-06-03 ENCOUNTER — Encounter
Admission: RE | Admit: 2016-06-03 | Discharge: 2016-06-03 | Disposition: A | Discharge status: Home / Self Care | Payer: PPO | Source: Ambulatory Visit | Attending: Orthopedic Surgery | Admitting: Orthopedic Surgery

## 2016-06-03 DIAGNOSIS — I499 Cardiac arrhythmia, unspecified: Secondary | ICD-10-CM | POA: Diagnosis not present

## 2016-06-03 DIAGNOSIS — G5602 Carpal tunnel syndrome, left upper limb: Secondary | ICD-10-CM | POA: Diagnosis not present

## 2016-06-03 DIAGNOSIS — Z853 Personal history of malignant neoplasm of breast: Secondary | ICD-10-CM | POA: Diagnosis not present

## 2016-06-03 DIAGNOSIS — J449 Chronic obstructive pulmonary disease, unspecified: Secondary | ICD-10-CM | POA: Diagnosis not present

## 2016-06-03 DIAGNOSIS — Z85038 Personal history of other malignant neoplasm of large intestine: Secondary | ICD-10-CM | POA: Diagnosis not present

## 2016-06-03 DIAGNOSIS — I119 Hypertensive heart disease without heart failure: Secondary | ICD-10-CM | POA: Diagnosis not present

## 2016-06-03 DIAGNOSIS — Z7982 Long term (current) use of aspirin: Secondary | ICD-10-CM | POA: Diagnosis not present

## 2016-06-03 DIAGNOSIS — I10 Essential (primary) hypertension: Secondary | ICD-10-CM | POA: Diagnosis not present

## 2016-06-03 DIAGNOSIS — M810 Age-related osteoporosis without current pathological fracture: Secondary | ICD-10-CM | POA: Diagnosis not present

## 2016-06-03 DIAGNOSIS — Z888 Allergy status to other drugs, medicaments and biological substances status: Secondary | ICD-10-CM | POA: Diagnosis not present

## 2016-06-03 DIAGNOSIS — F17200 Nicotine dependence, unspecified, uncomplicated: Secondary | ICD-10-CM | POA: Diagnosis not present

## 2016-06-03 DIAGNOSIS — Z9981 Dependence on supplemental oxygen: Secondary | ICD-10-CM | POA: Diagnosis not present

## 2016-06-03 DIAGNOSIS — Z90710 Acquired absence of both cervix and uterus: Secondary | ICD-10-CM | POA: Diagnosis not present

## 2016-06-03 DIAGNOSIS — S52532A Colles' fracture of left radius, initial encounter for closed fracture: Secondary | ICD-10-CM | POA: Diagnosis not present

## 2016-06-03 DIAGNOSIS — Z885 Allergy status to narcotic agent status: Secondary | ICD-10-CM | POA: Diagnosis not present

## 2016-06-03 DIAGNOSIS — Z79899 Other long term (current) drug therapy: Secondary | ICD-10-CM | POA: Diagnosis not present

## 2016-06-03 DIAGNOSIS — Z8614 Personal history of Methicillin resistant Staphylococcus aureus infection: Secondary | ICD-10-CM | POA: Diagnosis not present

## 2016-06-03 DIAGNOSIS — I509 Heart failure, unspecified: Secondary | ICD-10-CM | POA: Diagnosis not present

## 2016-06-03 HISTORY — DX: Anxiety disorder, unspecified: F41.9

## 2016-06-03 LAB — BASIC METABOLIC PANEL
Anion gap: 7 (ref 5–15)
BUN: 14 mg/dL (ref 6–20)
CALCIUM: 9.5 mg/dL (ref 8.9–10.3)
CO2: 30 mmol/L (ref 22–32)
CREATININE: 0.9 mg/dL (ref 0.44–1.00)
Chloride: 99 mmol/L — ABNORMAL LOW (ref 101–111)
GFR calc Af Amer: >60 mL/min (ref >60)
GFR calc non Af Amer: >60 mL/min (ref >60)
GLUCOSE: 109 mg/dL — AB (ref 65–99)
Potassium: 4.7 mmol/L (ref 3.5–5.1)
Sodium: 136 mmol/L (ref 135–145)

## 2016-06-03 LAB — CBC
HEMATOCRIT: 41.8 % (ref 35.0–47.0)
Hemoglobin: 13.8 g/dL (ref 12.0–16.0)
MCH: 28 pg (ref 26.0–34.0)
MCHC: 33.1 g/dL (ref 32.0–36.0)
MCV: 84.4 fL (ref 80.0–100.0)
Platelets: 266 10*3/uL (ref 150–440)
RBC: 4.95 MIL/uL (ref 3.80–5.20)
RDW: 14 % (ref 11.5–14.5)
WBC: 12.2 10*3/uL — ABNORMAL HIGH (ref 3.6–11.0)

## 2016-06-03 LAB — SURGICAL PCR SCREEN
MRSA, PCR: NEGATIVE
Staphylococcus aureus: NEGATIVE

## 2016-06-03 NOTE — Patient Instructions (Signed)
Your procedure is scheduled on: Tuesday 06/04/2016 Su procedimiento est programado para: Report to [facility] AT 11:15 AM    Remember: Instructions that are not followed completely may result in serious medical risk, up to and including death, or upon the discretion of your surgeon and anesthesiologist your surgery may need to be rescheduled.  Recuerde: Las instrucciones que no se siguen completamente Heritage manager en un riesgo de salud grave, incluyendo hasta la [place] o a discrecin de su cirujano y Environmental health practitioner, su ciruga se puede posponer.   _x___ 1. Do not eat food or drink liquids after midnight. No gum chewing or hard candies.  No coma alimentos ni tome lquidos despus de la medianoche.  No mastique chicle ni caramelos  duros.     __x__ 2. No alcohol for 24 hours before or after surgery.    No tome alcohol durante las 24 horas antes ni despus de la [place].   ____ 3. Bring all medications with you on the day of surgery if instructed.    Lleve todos los medicamentos con usted el da de su ciruga si se le ha indicado as.   ____ 4. Notify your doctor if there is any change in your medical condition (cold, fever,                             infections).    Informe a su mdico si hay algn cambio en su condicin mdica (resfriado, fiebre, infecciones).   Do not wear jewelry, make-up, hairpins, clips or nail polish.  No use joyas, maquillajes, pinzas/ganchos para el cabello ni esmalte de uas.  Do not wear lotions, powders, or perfumes. You may wear deodorant.  No use lociones, polvos o perfumes.  Puede usar desodorante.    Do not shave 48 hours prior to surgery. Men may shave face and neck.  No se afeite 48 horas antes de la [place].  Los hombres pueden [employer] cara y el cuello.   Do not bring valuables to the hospital.   No lleve objetos [facility] is not responsible for any belongings or  valuables.  [facility] no se hace responsable de ningn tipo de pertenencias u objetos de Geographical information systems officer.               Contacts, dentures or bridgework may not be worn into surgery.  Los lentes de [place], las dentaduras postizas o puentes no se pueden usar en la [place].  Leave your suitcase in the car. After surgery it may be brought to your room.  Deje su maleta en el auto.  Despus de la ciruga podr traerla a su habitacin.  For patients admitted to the hospital, discharge time is determined by your treatment team.  Para los pacientes que sean ingresados al hospital, el tiempo en el cual se le dar de alta es determinado por su                equipo de [place].   Patients discharged the day of surgery will not be allowed to drive home. A los pacientes que se les da de alta el mismo da de la ciruga no se les permitir conducir a Holiday representative.   Please read over the following fact sheets that you were given: Por favor [place] informacin que le dieron:   MRSA INFECTION, CHG INSTRUCTION, PREOP INFO SHEETS  ____ Take these medicines the morning of surgery with A SIP OF WATER:          [employer] estas medicinas la maana de la ciruga con UN SORBO DE AGUA:  1. LOSARTAN  2. ATENOLOL  3. SINGULAR  4. PREDNISONE       5. XANAX IF NEEDED  6. [place] IF NEEDED  ____ Fleet Enema (as directed)          Enema de [place] (segn lo indicado)    __x_ Use CHG Soap as directed          Utilice el jabn de CHG segn lo indicado  __x__ Use inhalers on the day of surgery          Use los inhaladores el da de la ciruga  ____ Stop metformin 2 days prior to surgery          Deje de tomar el metformin 2 das antes de la ciruga    ____ Take 1/2 of usual insulin dose the night before surgery and none on the morning of surgery           Tome la mitad de la dosis habitual de insulina la noche antes de la [place] y no tome nada en la maana de la             ciruga  ____ Stop  Coumadin/Plavix/aspirin on           Deje de tomar el Coumadin/Plavix/aspirina el da:  __X__ Stop Anti-inflammatories - USE TYLENOL          Deje de tomar antiinflamatorios el da:   ____ Stop supplements until after surgery            Deje de tomar suplementos hasta despus de la ciruga  ____ Bring C-Pap to the hospital          [facility]       .     _.

## 2016-06-03 NOTE — Pre-Procedure Instructions (Signed)
ANESTHESIA - CLEARANCE NOTE IN PAPER CHART

## 2016-06-04 ENCOUNTER — Ambulatory Visit
Admission: RE | Admit: 2016-06-04 | Discharge: 2016-06-04 | Disposition: A | Discharge status: Home / Self Care | Payer: PPO | Source: Ambulatory Visit | Attending: Orthopedic Surgery | Admitting: Orthopedic Surgery

## 2016-06-04 ENCOUNTER — Encounter: Payer: Self-pay | Admitting: *Deleted

## 2016-06-04 ENCOUNTER — Ambulatory Visit: Payer: PPO

## 2016-06-04 ENCOUNTER — Encounter
Admission: RE | Disposition: A | Discharge status: Home / Self Care | Payer: Self-pay | Source: Ambulatory Visit | Attending: Orthopedic Surgery

## 2016-06-04 DIAGNOSIS — Z90710 Acquired absence of both cervix and uterus: Secondary | ICD-10-CM | POA: Insufficient documentation

## 2016-06-04 DIAGNOSIS — F17200 Nicotine dependence, unspecified, uncomplicated: Secondary | ICD-10-CM | POA: Insufficient documentation

## 2016-06-04 DIAGNOSIS — Z7982 Long term (current) use of aspirin: Secondary | ICD-10-CM | POA: Insufficient documentation

## 2016-06-04 DIAGNOSIS — S52502D Unspecified fracture of the lower end of left radius, subsequent encounter for closed fracture with routine healing: Secondary | ICD-10-CM | POA: Diagnosis not present

## 2016-06-04 DIAGNOSIS — Z888 Allergy status to other drugs, medicaments and biological substances status: Secondary | ICD-10-CM | POA: Insufficient documentation

## 2016-06-04 DIAGNOSIS — Z8781 Personal history of (healed) traumatic fracture: Secondary | ICD-10-CM

## 2016-06-04 DIAGNOSIS — Z9981 Dependence on supplemental oxygen: Secondary | ICD-10-CM | POA: Insufficient documentation

## 2016-06-04 DIAGNOSIS — S52532A Colles' fracture of left radius, initial encounter for closed fracture: Secondary | ICD-10-CM | POA: Diagnosis not present

## 2016-06-04 DIAGNOSIS — I119 Hypertensive heart disease without heart failure: Secondary | ICD-10-CM | POA: Insufficient documentation

## 2016-06-04 DIAGNOSIS — G5602 Carpal tunnel syndrome, left upper limb: Secondary | ICD-10-CM | POA: Diagnosis not present

## 2016-06-04 DIAGNOSIS — S52532S Colles' fracture of left radius, sequela: Secondary | ICD-10-CM | POA: Diagnosis not present

## 2016-06-04 DIAGNOSIS — Z853 Personal history of malignant neoplasm of breast: Secondary | ICD-10-CM | POA: Insufficient documentation

## 2016-06-04 DIAGNOSIS — M810 Age-related osteoporosis without current pathological fracture: Secondary | ICD-10-CM | POA: Insufficient documentation

## 2016-06-04 DIAGNOSIS — F419 Anxiety disorder, unspecified: Secondary | ICD-10-CM | POA: Diagnosis not present

## 2016-06-04 DIAGNOSIS — I509 Heart failure, unspecified: Secondary | ICD-10-CM | POA: Insufficient documentation

## 2016-06-04 DIAGNOSIS — Z8614 Personal history of Methicillin resistant Staphylococcus aureus infection: Secondary | ICD-10-CM | POA: Insufficient documentation

## 2016-06-04 DIAGNOSIS — Z9889 Other specified postprocedural states: Secondary | ICD-10-CM

## 2016-06-04 DIAGNOSIS — I10 Essential (primary) hypertension: Secondary | ICD-10-CM | POA: Insufficient documentation

## 2016-06-04 DIAGNOSIS — J449 Chronic obstructive pulmonary disease, unspecified: Secondary | ICD-10-CM | POA: Insufficient documentation

## 2016-06-04 DIAGNOSIS — Z85038 Personal history of other malignant neoplasm of large intestine: Secondary | ICD-10-CM | POA: Insufficient documentation

## 2016-06-04 DIAGNOSIS — I110 Hypertensive heart disease with heart failure: Secondary | ICD-10-CM | POA: Diagnosis not present

## 2016-06-04 DIAGNOSIS — Z885 Allergy status to narcotic agent status: Secondary | ICD-10-CM | POA: Insufficient documentation

## 2016-06-04 DIAGNOSIS — I499 Cardiac arrhythmia, unspecified: Secondary | ICD-10-CM | POA: Insufficient documentation

## 2016-06-04 DIAGNOSIS — Z79899 Other long term (current) drug therapy: Secondary | ICD-10-CM | POA: Insufficient documentation

## 2016-06-04 HISTORY — PX: CARPAL TUNNEL RELEASE: SHX101

## 2016-06-04 HISTORY — PX: OPEN REDUCTION INTERNAL FIXATION (ORIF) DISTAL RADIAL FRACTURE: SHX5989

## 2016-06-04 SURGERY — OPEN REDUCTION INTERNAL FIXATION (ORIF) DISTAL RADIUS FRACTURE
Anesthesia: General | Laterality: Left | Wound class: Clean

## 2016-06-04 MED ORDER — BUPIVACAINE HCL (PF) 0.5 % IJ SOLN
INTRAMUSCULAR | Status: DC | PRN
  Administered 2016-06-04: 10 mL

## 2016-06-04 MED ORDER — ONDANSETRON HCL 4 MG/2ML IJ SOLN: 4.0000 mg | Freq: Once | INTRAMUSCULAR | Status: DC | PRN

## 2016-06-04 MED ORDER — KETOROLAC TROMETHAMINE 30 MG/ML IJ SOLN
INTRAMUSCULAR | Status: AC
  Administered 2016-06-04: 30 mg via INTRAVENOUS
  Filled 2016-06-04: qty 1

## 2016-06-04 MED ORDER — BUPIVACAINE HCL (PF) 0.5 % IJ SOLN
INTRAMUSCULAR | Status: AC
  Filled 2016-06-04: qty 30

## 2016-06-04 MED ORDER — FENTANYL CITRATE (PF) 100 MCG/2ML IJ SOLN
25.0000 ug | INTRAMUSCULAR | Status: AC | PRN
  Administered 2016-06-04 (×6): 25 ug via INTRAVENOUS

## 2016-06-04 MED ORDER — KETOROLAC TROMETHAMINE 30 MG/ML IJ SOLN
30.0000 mg | Freq: Once | INTRAMUSCULAR | Status: AC
  Administered 2016-06-04: 30 mg via INTRAVENOUS

## 2016-06-04 MED ORDER — LACTATED RINGERS IV SOLN
INTRAVENOUS | Status: DC
  Administered 2016-06-04: 14:00:00 via INTRAVENOUS

## 2016-06-04 MED ORDER — FENTANYL CITRATE (PF) 100 MCG/2ML IJ SOLN
INTRAMUSCULAR | Status: DC | PRN
  Administered 2016-06-04: 50 ug via INTRAVENOUS

## 2016-06-04 MED ORDER — NEOMYCIN-POLYMYXIN B GU 40-200000 IR SOLN
Status: DC | PRN
  Administered 2016-06-04: 2 mL

## 2016-06-04 MED ORDER — ONDANSETRON HCL 4 MG/2ML IJ SOLN
INTRAMUSCULAR | Status: DC | PRN
Start: 2016-06-04 — End: 2016-06-04
  Administered 2016-06-04: 4 mg via INTRAVENOUS

## 2016-06-04 MED ORDER — MIDAZOLAM HCL 2 MG/2ML IJ SOLN
INTRAMUSCULAR | Status: DC | PRN
  Administered 2016-06-04: 1 mg via INTRAVENOUS

## 2016-06-04 MED ORDER — NEOMYCIN-POLYMYXIN B GU 40-200000 IR SOLN
Status: AC
  Filled 2016-06-04: qty 2

## 2016-06-04 MED ORDER — FENTANYL CITRATE (PF) 100 MCG/2ML IJ SOLN
INTRAMUSCULAR | Status: AC
  Administered 2016-06-04: 25 ug via INTRAVENOUS
  Filled 2016-06-04: qty 2

## 2016-06-04 MED ORDER — ACETAMINOPHEN 10 MG/ML IV SOLN
1000.0000 mg | Freq: Once | INTRAVENOUS | Status: AC
  Administered 2016-06-04: 1000 mg via INTRAVENOUS

## 2016-06-04 MED ORDER — HYDROMORPHONE HCL 1 MG/ML IJ SOLN
INTRAMUSCULAR | Status: AC
  Administered 2016-06-04: 0.25 mg via INTRAVENOUS
  Filled 2016-06-04: qty 1

## 2016-06-04 MED ORDER — DEXAMETHASONE SODIUM PHOSPHATE 10 MG/ML IJ SOLN
INTRAMUSCULAR | Status: DC | PRN
Start: 2016-06-04 — End: 2016-06-04
  Administered 2016-06-04: 5 mg via INTRAVENOUS

## 2016-06-04 MED ORDER — CEFAZOLIN SODIUM-DEXTROSE 2-4 GM/100ML-% IV SOLN
2.0000 g | Freq: Once | INTRAVENOUS | Status: AC
  Administered 2016-06-04: 2 g via INTRAVENOUS

## 2016-06-04 MED ORDER — EPHEDRINE SULFATE 50 MG/ML IJ SOLN
INTRAMUSCULAR | Status: DC | PRN
  Administered 2016-06-04: 15 mg via INTRAVENOUS
  Administered 2016-06-04: 12 mg via INTRAVENOUS

## 2016-06-04 MED ORDER — PHENYLEPHRINE HCL 10 MG/ML IJ SOLN
INTRAMUSCULAR | Status: DC | PRN
  Administered 2016-06-04: 200 ug via INTRAVENOUS

## 2016-06-04 MED ORDER — HYDROMORPHONE HCL 1 MG/ML IJ SOLN
INTRAMUSCULAR | Status: AC
Start: 2016-06-04 — End: 2016-06-04
  Administered 2016-06-04: 0.25 mg via INTRAVENOUS
  Filled 2016-06-04: qty 1

## 2016-06-04 MED ORDER — HYDROMORPHONE HCL 1 MG/ML IJ SOLN
0.2500 mg | INTRAMUSCULAR | Status: AC | PRN
  Administered 2016-06-04 (×8): 0.25 mg via INTRAVENOUS

## 2016-06-04 MED ORDER — ACETAMINOPHEN 10 MG/ML IV SOLN
INTRAVENOUS | Status: AC
  Administered 2016-06-04: 1000 mg via INTRAVENOUS
  Filled 2016-06-04: qty 100

## 2016-06-04 MED ORDER — FAMOTIDINE 20 MG PO TABS
ORAL_TABLET | ORAL | Status: AC
  Administered 2016-06-04: 20 mg via ORAL
  Filled 2016-06-04: qty 1

## 2016-06-04 MED ORDER — CEFAZOLIN SODIUM-DEXTROSE 2-4 GM/100ML-% IV SOLN
INTRAVENOUS | Status: AC
  Filled 2016-06-04: qty 100

## 2016-06-04 MED ORDER — PROPOFOL 10 MG/ML IV BOLUS
INTRAVENOUS | Status: DC | PRN
  Administered 2016-06-04: 150 mg via INTRAVENOUS

## 2016-06-04 MED ORDER — FAMOTIDINE 20 MG PO TABS
20.0000 mg | ORAL_TABLET | Freq: Once | ORAL | Status: AC
  Administered 2016-06-04: 20 mg via ORAL

## 2016-06-04 SURGICAL SUPPLY — 41 items
BANDAGE ACE 4X5 VEL STRL LF (GAUZE/BANDAGES/DRESSINGS) ×3 IMPLANT
BIT DRILL 2 FAST STEP (BIT) ×3 IMPLANT
BIT DRILL 2.5X4 QC (BIT) ×3 IMPLANT
CANISTER SUCT 1200ML W/VALVE (MISCELLANEOUS) ×3 IMPLANT
CHLORAPREP W/TINT 26ML (MISCELLANEOUS) ×3 IMPLANT
CUFF TOURN 18 STER (MISCELLANEOUS) IMPLANT
DRAPE FLUOR MINI C-ARM 54X84 (DRAPES) ×3 IMPLANT
ELECT REM PT RETURN 9FT ADLT (ELECTROSURGICAL) ×3
ELECTRODE REM PT RTRN 9FT ADLT (ELECTROSURGICAL) ×1 IMPLANT
GAUZE PETRO XEROFOAM 1X8 (MISCELLANEOUS) ×6 IMPLANT
GAUZE SPONGE 4X4 12PLY STRL (GAUZE/BANDAGES/DRESSINGS) ×3 IMPLANT
GLOVE BIOGEL PI IND STRL 9 (GLOVE) ×1 IMPLANT
GLOVE BIOGEL PI INDICATOR 9 (GLOVE) ×2
GLOVE SURG ORTHO 9.0 STRL STRW (GLOVE) ×3 IMPLANT
GOWN SRG 2XL LVL 4 RGLN SLV (GOWNS) ×1 IMPLANT
GOWN STRL NON-REIN 2XL LVL4 (GOWNS) ×2
GOWN STRL REUS W/ TWL LRG LVL3 (GOWN DISPOSABLE) ×1 IMPLANT
GOWN STRL REUS W/TWL LRG LVL3 (GOWN DISPOSABLE) ×2
K-WIRE 1.6 (WIRE) ×2
K-WIRE FX5X1.6XNS BN SS (WIRE) ×1
KIT RM TURNOVER STRD PROC AR (KITS) ×3 IMPLANT
KWIRE FX5X1.6XNS BN SS (WIRE) ×1 IMPLANT
NEEDLE FILTER BLUNT 18X 1/2SAF (NEEDLE) ×2
NEEDLE FILTER BLUNT 18X1 1/2 (NEEDLE) ×1 IMPLANT
NS IRRIG 500ML POUR BTL (IV SOLUTION) ×3 IMPLANT
PACK EXTREMITY ARMC (MISCELLANEOUS) ×3 IMPLANT
PAD CAST CTTN 4X4 STRL (SOFTGOODS) ×2 IMPLANT
PADDING CAST COTTON 4X4 STRL (SOFTGOODS) ×4
PEG SUBCHONDRAL SMOOTH 2.0X14 (Peg) ×3 IMPLANT
PEG SUBCHONDRAL SMOOTH 2.0X20 (Peg) ×3 IMPLANT
PEG SUBCHONDRAL SMOOTH 2.0X22 (Peg) ×12 IMPLANT
PLATE SHORT 21.6X48.9 NRRW LT (Plate) ×3 IMPLANT
SCREW CORT 3.5X10 LNG (Screw) ×9 IMPLANT
SCREW CORT 3.5X14 LNG (Screw) ×3 IMPLANT
SPLINT CAST 1 STEP 3X12 (MISCELLANEOUS) ×3 IMPLANT
STOCKINETTE STRL 4IN 9604848 (GAUZE/BANDAGES/DRESSINGS) ×3 IMPLANT
SUT ETHILON 4-0 (SUTURE) ×2
SUT ETHILON 4-0 FS2 18XMFL BLK (SUTURE) ×1
SUT VICRYL 3-0 27IN (SUTURE) ×3 IMPLANT
SUTURE ETHLN 4-0 FS2 18XMF BLK (SUTURE) ×1 IMPLANT
SYR 3ML LL SCALE MARK (SYRINGE) ×3 IMPLANT

## 2016-06-04 NOTE — Discharge Instructions (Addendum)
Keep arm elevated as much as possible. Work fingers is much as he can. Expect numbness in the hand today because of local anesthetic, should be better tomorrow      .AMBULATORY SURGERY  DISCHARGE INSTRUCTIONS   1) The drugs that you were given will stay in your system until tomorrow so for the next 24 hours you should not:  A) Drive an automobile B) Make any legal decisions C) Drink any alcoholic beverage   2) You may resume regular meals tomorrow.  Today it is better to start with liquids and gradually work up to solid foods.  You may eat anything you prefer, but it is better to start with liquids, then soup and crackers, and gradually work up to solid foods.   3) Please notify your doctor immediately if you have any unusual bleeding, trouble breathing, redness and pain at the surgery site, drainage, fever, or pain not relieved by medication. 4)   5) Your post-operative visit with Dr.                                     is: Date:                        Time:    Please call to schedule your post-operative visit.  6) Additional Instructions:

## 2016-06-04 NOTE — Anesthesia Procedure Notes (Signed)
Procedure Name: LMA Insertion Date/Time: 06/04/2016 2:43 PM Performed by: Kennon Holter Pre-anesthesia Checklist: Patient identified, Emergency Drugs available, Suction available, Patient being monitored and Timeout performed Patient Re-evaluated:Patient Re-evaluated prior to inductionOxygen Delivery Method: Circle system utilized Preoxygenation: Pre-oxygenation with 100% oxygen Intubation Type: IV induction LMA: LMA inserted LMA Size: 3.5 Placement Confirmation: positive ETCO2 and breath sounds checked- equal and bilateral

## 2016-06-04 NOTE — H&P (Signed)
Reviewed paper H+P, will be scanned into chart. No changes noted.  

## 2016-06-04 NOTE — Anesthesia Preprocedure Evaluation (Signed)
Anesthesia Evaluation  Patient identified by MRN, date of birth, ID band Patient awake    Reviewed: Allergy & Precautions, H&P , NPO status , Patient's Chart, lab work & pertinent test results, reviewed documented beta blocker date and time   History of Anesthesia Complications Negative for: history of anesthetic complications  Airway Mallampati: III  TM Distance: >3 FB Neck ROM: full    Dental no notable dental hx. (+) Edentulous Upper, Edentulous Lower, Upper Dentures, Lower Dentures   Pulmonary shortness of breath and with exertion, asthma , neg sleep apnea, pneumonia, resolved, COPD,  COPD inhaler and oxygen dependent, neg recent URI, Current Smoker,    breath sounds clear to auscultation + decreased breath sounds      Cardiovascular Exercise Tolerance: Good hypertension, On Medications (-) angina+CHF  (-) CAD, (-) Past MI, (-) Cardiac Stents and (-) CABG Normal cardiovascular exam+ dysrhythmias (-) Valvular Problems/Murmurs Rhythm:regular Rate:Normal     Neuro/Psych negative neurological ROS  negative psych ROS   GI/Hepatic negative GI ROS, Neg liver ROS,   Endo/Other  negative endocrine ROS  Renal/GU negative Renal ROS  negative genitourinary   Musculoskeletal   Abdominal   Peds  Hematology negative hematology ROS (+)   Anesthesia Other Findings Past Medical History:   COPD (chronic obstructive pulmonary disease) (*              Hypertension                                                 Asthma                                                       CHF (congestive heart failure) (HCC)                         Hyperlipemia                                                 B12 deficiency                                               Osteoporosis                                                 Right adrenal mass (HCC)                                     Allergic rhinitis  MRSA pneumonia ([place])                                         Colon cancer ([place])                              2010         Colon cancer ([place])                                           Carcinoma of right breast treated with adjuvan* 1999           Comment:RT LUMPECTOMY   Breast cancer ([place])                             1999           Comment:RT LUMPECTOMY   Radiation                                       1999           Comment:BREAST CA   Anxiety                                                      Reproductive/Obstetrics negative OB ROS                             Anesthesia Physical Anesthesia Plan  ASA: III  Anesthesia Plan: General   Post-op Pain Management:    Induction:   Airway Management Planned:   Additional Equipment:   Intra-op Plan:   Post-operative Plan:   Informed Consent: I have reviewed the patients History and Physical, chart, labs and discussed the procedure including the risks, benefits and alternatives for the proposed anesthesia with the patient or authorized representative who has indicated his/her understanding and acceptance.   Dental Advisory Given  Plan Discussed with: Anesthesiologist, CRNA and Surgeon  Anesthesia Plan Comments:         Anesthesia Quick Evaluation

## 2016-06-04 NOTE — Transfer of Care (Signed)
Immediate Anesthesia Transfer of Care Note  Patient: Robin Hudson  Procedure(s) Performed: Procedure(s): OPEN REDUCTION INTERNAL FIXATION (ORIF) DISTAL RADIAL FRACTURE (Left) CARPAL TUNNEL RELEASE (Left)  Patient Location: PACU  Anesthesia Type:General  Level of Consciousness: awake, alert  and oriented  Airway & Oxygen Therapy: Patient Spontanous Breathing and Patient connected to face mask oxygen  Post-op Assessment: Report given to RN and Post -op Vital signs reviewed and stable  Post vital signs: Reviewed and stable  Last Vitals:  Filed Vitals:   06/04/16 1545 06/04/16 1546  BP: 112/82   Pulse: 78   Temp: 36.7 C 36.7 C  Resp: 21     Last Pain:  Filed Vitals:   06/04/16 1546  PainSc: 5          Complications: No apparent anesthesia complications

## 2016-06-04 NOTE — Interval H&P Note (Signed)
History and Physical Interval Note:  06/04/2016 3:51 PM  Robin Hudson  has presented today for surgery, with the diagnosis of closed colles fracture left radius,sequela  The various methods of treatment have been discussed with the patient and family. After consideration of risks, benefits and other options for treatment, the patient has consented to  Procedure(s): OPEN REDUCTION INTERNAL FIXATION (ORIF) DISTAL RADIAL FRACTURE (Left) CARPAL TUNNEL RELEASE (Left) as a surgical intervention .  The patient's history has been reviewed, patient examined, no change in status, stable for surgery.  I have reviewed the patient's chart and labs.  Questions were answered to the patient's satisfaction.     Robin Hudson   Patient has developed tingling since her initial presentation in the median nerve distribution and has some weakness to the thenar musculature on exam today so carpal tunnel release adequate to permit an surgery today

## 2016-06-04 NOTE — Op Note (Signed)
06/04/2016  3:53 PM  PATIENT:  Robin Hudson  75 y.o. female  PRE-OPERATIVE DIAGNOSIS:  closed colles fracture left radius,sequela, acute carpal tunnel syndrome  POST-OPERATIVE DIAGNOSIS:  closed colles fracture left radius,sequela acute carpal tunnel syndrome,   PROCEDURE:  Procedure(s): OPEN REDUCTION INTERNAL FIXATION (ORIF) DISTAL RADIAL FRACTURE (Left) CARPAL TUNNEL RELEASE (Left)  SURGEON: Laurene Footman, MD  ASSISTANTS: None   ANESTHESIA:   general  EBL:  Total I/O In: 700 [I.V.:700] Out: 5 [Blood:5]  BLOOD ADMINISTERED:none  DRAINS: none   LOCAL MEDICATIONS USED:  MARCAINE     SPECIMEN:  No Specimen  DISPOSITION OF SPECIMEN:  N/A  COUNTS:  YES  TOURNIQUET:  34 minutes at 250 mmHg   IMPLANTS: [employer] short narrow DVR plate with multiple pegs and screws   DICTATION: .Dragon Dictation patient brought the operating room and after adequate general anesthesia was obtained left arm was prepped and draped in sterile fashion. After patient education timeout procedures were completed fingertrap traction and 10 pounds of traction was applied along with raising the tourniquet. A volar approach is made over the FCR tendon the FCR tendon was sheath was incised and the tendon retracted radially deep fascia incised. The distal radius was exposed with the fracture site debrided with a small rongeur to see the fracture site. The fracture was somewhat stuck and did not move with traction so a freer elevator was used to enter the fracture site and loosen this tissue. This didn't adequately mobilized the fracture site. A distal first of tip technique was then applied with the plate applied distally with multiple pedicles filled with smooth pegs. After these have been all place to the plate was brought down to the shaft of the radius first using a longer screw through the most distal hole bringing the plate down when it was brought to a close of position the 2 more proximal  screw holes were filled with 10 mm screws which brought the plate against the bone the first screw was changed out for a 10 mm screw. Fluoroscopy showed anatomic alignment with restoration of tilt radial inclination and length. There is no penetration into the joint. Next a carpal tunnel release was performed through approximately 2 cm incision in line with ring metacarpal. Incision was carried down through skin and subcutaneous tissues tissue and the transverse carpal ligament identified and opened with a vascular hemostat protecting the underlying structures. Releases carried out distally until fat was noted around the nerve approximately to the wrist flexion crease at which point there did not appear to be any further compression. The wounds were irrigated and a total of 10 cc half percent Sensorcaine without epinephrine infiltrated near the incisions. The wounds were then closed with 3-0 Vicryl and the proximal incision along with 4-0 nylon for the skin in a simple fashion for both incisions. Xeroform 4 x 4 web roll and Ace wrap were applied with a volar splint. Prior to closure the tourniquet was let down and the radial artery was intact.   PLAN OF CARE: Discharge to home after PACU  PATIENT DISPOSITION:  PACU - hemodynamically stable.

## 2016-06-05 ENCOUNTER — Encounter: Payer: Self-pay | Admitting: Orthopedic Surgery

## 2016-06-05 NOTE — Anesthesia Postprocedure Evaluation (Signed)
Anesthesia Post Note  Patient: DIM MEISINGER  Procedure(s) Performed: Procedure(s) (LRB): OPEN REDUCTION INTERNAL FIXATION (ORIF) DISTAL RADIAL FRACTURE (Left) CARPAL TUNNEL RELEASE (Left)  Patient location during evaluation: PACU Anesthesia Type: General Level of consciousness: awake and alert Pain management: pain level controlled Vital Signs Assessment: post-procedure vital signs reviewed and stable Respiratory status: spontaneous breathing, nonlabored ventilation, respiratory function stable and patient connected to nasal cannula oxygen Cardiovascular status: blood pressure returned to baseline and stable Postop Assessment: no signs of nausea or vomiting Anesthetic complications: no    Last Vitals:  Filed Vitals:   06/04/16 1724 06/04/16 1752  BP: 146/55 131/63  Pulse: 73 74  Temp: 36.7 C   Resp: 20 20    Last Pain:  Filed Vitals:   06/05/16 0843  PainSc: 4                  Lawren Clan

## 2016-06-07 DIAGNOSIS — S52532D Colles' fracture of left radius, subsequent encounter for closed fracture with routine healing: Secondary | ICD-10-CM | POA: Diagnosis not present

## 2016-06-14 DIAGNOSIS — Z967 Presence of other bone and tendon implants: Secondary | ICD-10-CM | POA: Diagnosis not present

## 2016-06-14 DIAGNOSIS — Z8781 Personal history of (healed) traumatic fracture: Secondary | ICD-10-CM | POA: Diagnosis not present

## 2016-06-28 ENCOUNTER — Emergency Department: Payer: PPO

## 2016-06-28 ENCOUNTER — Emergency Department
Admission: EM | Admit: 2016-06-28 | Discharge: 2016-06-28 | Disposition: A | Discharge status: Home / Self Care | Payer: PPO | Attending: Emergency Medicine | Admitting: Emergency Medicine

## 2016-06-28 DIAGNOSIS — Z853 Personal history of malignant neoplasm of breast: Secondary | ICD-10-CM | POA: Insufficient documentation

## 2016-06-28 DIAGNOSIS — J441 Chronic obstructive pulmonary disease with (acute) exacerbation: Secondary | ICD-10-CM | POA: Diagnosis not present

## 2016-06-28 DIAGNOSIS — Z79899 Other long term (current) drug therapy: Secondary | ICD-10-CM | POA: Diagnosis not present

## 2016-06-28 DIAGNOSIS — Z85038 Personal history of other malignant neoplasm of large intestine: Secondary | ICD-10-CM | POA: Insufficient documentation

## 2016-06-28 DIAGNOSIS — E785 Hyperlipidemia, unspecified: Secondary | ICD-10-CM | POA: Insufficient documentation

## 2016-06-28 DIAGNOSIS — F17210 Nicotine dependence, cigarettes, uncomplicated: Secondary | ICD-10-CM | POA: Insufficient documentation

## 2016-06-28 DIAGNOSIS — R42 Dizziness and giddiness: Secondary | ICD-10-CM | POA: Insufficient documentation

## 2016-06-28 DIAGNOSIS — M546 Pain in thoracic spine: Secondary | ICD-10-CM | POA: Diagnosis not present

## 2016-06-28 DIAGNOSIS — I110 Hypertensive heart disease with heart failure: Secondary | ICD-10-CM | POA: Insufficient documentation

## 2016-06-28 DIAGNOSIS — R0602 Shortness of breath: Secondary | ICD-10-CM

## 2016-06-28 DIAGNOSIS — I2510 Atherosclerotic heart disease of native coronary artery without angina pectoris: Secondary | ICD-10-CM | POA: Diagnosis not present

## 2016-06-28 DIAGNOSIS — M1990 Unspecified osteoarthritis, unspecified site: Secondary | ICD-10-CM | POA: Diagnosis not present

## 2016-06-28 DIAGNOSIS — R9431 Abnormal electrocardiogram [ECG] [EKG]: Secondary | ICD-10-CM

## 2016-06-28 DIAGNOSIS — M549 Dorsalgia, unspecified: Secondary | ICD-10-CM | POA: Diagnosis not present

## 2016-06-28 DIAGNOSIS — Z9889 Other specified postprocedural states: Secondary | ICD-10-CM | POA: Insufficient documentation

## 2016-06-28 DIAGNOSIS — I509 Heart failure, unspecified: Secondary | ICD-10-CM | POA: Diagnosis not present

## 2016-06-28 DIAGNOSIS — F419 Anxiety disorder, unspecified: Secondary | ICD-10-CM | POA: Diagnosis not present

## 2016-06-28 DIAGNOSIS — I10 Essential (primary) hypertension: Secondary | ICD-10-CM | POA: Diagnosis not present

## 2016-06-28 LAB — BASIC METABOLIC PANEL
Anion gap: 6 (ref 5–15)
BUN: 10 mg/dL (ref 6–20)
CO2: 29 mmol/L (ref 22–32)
CREATININE: 0.66 mg/dL (ref 0.44–1.00)
Calcium: 9.1 mg/dL (ref 8.9–10.3)
Chloride: 100 mmol/L — ABNORMAL LOW (ref 101–111)
GFR calc Af Amer: >60 mL/min (ref >60)
Glucose, Bld: 108 mg/dL — ABNORMAL HIGH (ref 65–99)
Potassium: 3.8 mmol/L (ref 3.5–5.1)
SODIUM: 135 mmol/L (ref 135–145)

## 2016-06-28 LAB — CBC
HCT: 41.1 % (ref 35.0–47.0)
Hemoglobin: 14.2 g/dL (ref 12.0–16.0)
MCH: 28.6 pg (ref 26.0–34.0)
MCHC: 34.5 g/dL (ref 32.0–36.0)
MCV: 82.9 fL (ref 80.0–100.0)
PLATELETS: 232 10*3/uL (ref 150–440)
RBC: 4.96 MIL/uL (ref 3.80–5.20)
RDW: 14.3 % (ref 11.5–14.5)
WBC: 8.1 10*3/uL (ref 3.6–11.0)

## 2016-06-28 LAB — TROPONIN I: Troponin I: <0.03 ng/mL (ref <0.03)

## 2016-06-28 LAB — BRAIN NATRIURETIC PEPTIDE: B Natriuretic Peptide: 750 pg/mL — ABNORMAL HIGH (ref 0.0–100.0)

## 2016-06-28 MED ORDER — HYDROCODONE-ACETAMINOPHEN 5-325 MG PO TABS
1.0000 | ORAL_TABLET | Freq: Once | ORAL | Status: AC
  Administered 2016-06-28: 1 via ORAL
  Filled 2016-06-28: qty 1

## 2016-06-28 MED ORDER — NITROGLYCERIN 2 % TD OINT
1.0000 [in_us] | TOPICAL_OINTMENT | Freq: Once | TRANSDERMAL | Status: AC
  Administered 2016-06-28: 1 [in_us] via TOPICAL
  Filled 2016-06-28: qty 1

## 2016-06-28 MED ORDER — ASPIRIN EC 81 MG PO TBEC: 81.0000 mg | DELAYED_RELEASE_TABLET | Freq: Every day | ORAL | 2 refills | Status: AC

## 2016-06-28 MED ORDER — ALPRAZOLAM 0.5 MG PO TABS
0.2500 mg | ORAL_TABLET | Freq: Once | ORAL | Status: AC
  Administered 2016-06-28: 0.25 mg via ORAL
  Filled 2016-06-28: qty 1

## 2016-06-28 MED ORDER — MORPHINE SULFATE (PF) 2 MG/ML IV SOLN
2.0000 mg | Freq: Once | INTRAVENOUS | Status: DC
  Filled 2016-06-28: qty 1

## 2016-06-28 MED ORDER — IOPAMIDOL (ISOVUE-370) INJECTION 76%
100.0000 mL | Freq: Once | INTRAVENOUS | Status: AC | PRN
  Administered 2016-06-28: 100 mL via INTRAVENOUS

## 2016-06-28 MED ORDER — ASPIRIN 81 MG PO CHEW
162.0000 mg | CHEWABLE_TABLET | Freq: Once | ORAL | Status: AC
  Administered 2016-06-28: 162 mg via ORAL
  Filled 2016-06-28: qty 2

## 2016-06-28 NOTE — ED Notes (Signed)
Pt requested hydrocodone for back pain but now pt asleep in bed. Will wait for pt to be awake prior to asking about narcotics. Respirations unlabored. NAD

## 2016-06-28 NOTE — ED Notes (Signed)
Pt reports that she has been sick for 3-4 days with nausea - pt reports she began to be short of breath and last night she felt weak and "BP was high" and she took a baby ASA and began to feel better - this am her shoulder began to hurt and she felt she needed to be checked out

## 2016-06-28 NOTE — ED Provider Notes (Signed)
[facility] Emergency Department Provider Note  ____________________________________________  Time seen: Approximately 4:33 AM  I have reviewed the triage vital signs and the nursing notes.   HISTORY  Chief Complaint Shortness of Breath   HPI Robin Hudson is a 75 y.o. female with a history of native vessel CAD, CHF, right breast cancer, COPD, hypertension, hyperlipidemia, anxiety, smoking, RA who presents for evaluation of shortness of breath. Patient reports for the last 2-3 days that she felt more fatigued and with last energy. This evening she reports that she was not able to get comfortable in her bed so she laid on her recliner and fell asleep. She then woke up pain between her shoulder blades that she describes as dull and nonradiating. Pain is not pleuritic. She became anxious and wanted to take her prescribed xanax but she noticed that she had run out of it. She then put a heating pad and took an oxycodone with no improvement of her pain. She continues to have 8/10 pain. She then started feeling more short of breath and anxious. She reached for her oxygen which she only uses as needed. She denies wheezing and reports that this does not feel like her COPD. She took two baby aspirins this evening.She does reports feeling more anxious today. She has had no cough. She denies chest pain. She also reports for the last few days she's been feeling dizzy especially when she stands up really fast. Patient denies any leg swelling or pain. She underwent a ORIF of her left radius on 06/04/16. She reports that since her surgery she has been smoking more. Patient underwent stress test on 06/03/16 which was unremarkable.  Past Medical History:  Diagnosis Date  . Allergic rhinitis   . Anxiety   . Asthma   . B12 deficiency   . Breast cancer ([place]) 1999   RT LUMPECTOMY  . Carcinoma of right breast treated with adjuvant chemotherapy ([place]) 1999   RT LUMPECTOMY  . CHF  (congestive heart failure) ([place])   . Colon cancer ([place]) 2010  . Colon cancer ([place])   . COPD (chronic obstructive pulmonary disease) ([place])   . Hyperlipemia   . Hypertension   . MRSA pneumonia ([place])   . Osteoporosis   . Radiation 1999   BREAST CA  . Right adrenal mass [facility])     Patient Active Problem List   Diagnosis Date Noted  . Hypoxia 12/06/2015  . COPD exacerbation ([place]) 12/03/2015  . Elevated troponin 12/03/2015  . Elevated antinuclear antibody (ANA) level 05/15/2015  . Hyperlipemia 05/12/2015  . COPD (chronic obstructive pulmonary disease) ([place]) 05/12/2015  . HTN (hypertension) 05/12/2015  . Osteoporosis 05/12/2015  . Right adrenal mass ([place]) 05/12/2015  . B12 deficiency 05/12/2015  . Allergic rhinitis 05/12/2015  . Breast cancer ([place]) 05/12/2015  . Colon cancer ([place]) 05/12/2015  . Closed head injury 12/15/2014    Past Surgical History:  Procedure Laterality Date  . ABDOMINAL HYSTERECTOMY    . APPENDECTOMY    . BREAST SURGERY Right    lumpectomy x2 with lymph nodes  . CARPAL TUNNEL RELEASE Left 06/04/2016   Procedure: CARPAL TUNNEL RELEASE;  Surgeon: Hessie Knows, MD;  Location: [facility] ORS;  Service: Orthopedics;  Laterality: Left;  . colectomy     partial  . IMAGE GUIDED SINUS SURGERY    . OPEN REDUCTION INTERNAL FIXATION (ORIF) DISTAL RADIAL FRACTURE Left 06/04/2016   Procedure: OPEN REDUCTION INTERNAL FIXATION (ORIF) DISTAL RADIAL FRACTURE;  Surgeon: Hessie Knows, MD;  Location: [facility] ORS;  Service: Orthopedics;  Laterality: Left;    Prior to Admission medications   Medication Sig Start Date End Date Taking? Authorizing Provider  ALPRAZolam Duanne Moron) 0.5 MG tablet Take 1 tablet by mouth 2 (two) times daily as needed for anxiety.  04/19/15  Yes Historical Provider, MD  atenolol (TENORMIN) 50 MG tablet Take 1 tablet (50 mg total) by mouth daily. 05/18/15  Yes Tama High III, MD  cholecalciferol (VITAMIN D) 1000 units tablet Take 1,000 Units by mouth daily.   Yes  Historical Provider, MD  escitalopram (LEXAPRO) 10 MG tablet Take 1 tablet by mouth daily. 04/12/15  Yes Historical Provider, MD  HYDROcodone-acetaminophen (NORCO) 10-325 MG per tablet Take 0.5-1 tablets by mouth 3 (three) times daily as needed for moderate pain or severe pain.  04/28/15  Yes Historical Provider, MD  hydroxychloroquine (PLAQUENIL) 200 MG tablet Take 400 mg by mouth daily. Reported on 12/06/2015   Yes Historical Provider, MD  losartan (COZAAR) 50 MG tablet Take 1 tablet (50 mg total) by mouth daily. 05/18/15  Yes Tama High III, MD  predniSONE (DELTASONE) 5 MG tablet Take 1 tablet (5 mg total) by mouth daily with breakfast. Start taking this 5 mg prednisone tablet after completing prednisone tapering dose 12/04/15  Yes Nicholes Mango, MD  PROAIR HFA 108 (90 BASE) MCG/ACT inhaler Take 2 puffs by mouth every 6 (six) hours as needed for wheezing.  04/12/15  Yes Historical Provider, MD  SPIRIVA HANDIHALER 18 MCG inhalation capsule Take 1 Inhaler by mouth daily. 04/24/15  Yes Historical Provider, MD  azelastine (ASTELIN) 0.1 % nasal spray Place 1 spray into both nostrils 2 (two) times daily. Use in each nostril as directed    Historical Provider, MD  predniSONE (STERAPRED UNI-PAK 21 TAB) 10 MG (21) TBPK tablet Take 1 tablet (10 mg total) by mouth daily. Take 6 tablets by mouth for 1 day followed by  5 tablets by mouth for 1 day followed by  4 tablets by mouth for 1 day followed by  3 tablets by mouth for 1 day followed by  2 tablets by mouth for 1 day followed by  1 tablet by mouth for a day followed by 5 mg of prednisone by mouth once daily(home medication) Patient not taking: Reported on 06/03/2016 12/04/15   Nicholes Mango, MD    Allergies Coreg [carvedilol]  Family History  Problem Relation Age of Onset  . Heart attack Mother   . Breast cancer Sister 68  . Breast cancer Sister 44  . Breast cancer Other     Social History Social History  Substance Use Topics  . Smoking status:  Current Some Day Smoker    Packs/day: 0.25    Types: Cigarettes  . Smokeless tobacco: Never Used  . Alcohol use No    Review of Systems  Constitutional: Negative for fever. + fatigue Eyes: Negative for visual changes. ENT: Negative for sore throat. Cardiovascular: Negative for chest pain. Respiratory: + shortness of breath. Gastrointestinal: Negative for abdominal pain, vomiting or diarrhea. Genitourinary: Negative for dysuria. Musculoskeletal: + upper back pain. Skin: Negative for rash. Neurological: Negative for headaches, weakness or numbness.  ____________________________________________   PHYSICAL EXAM:  VITAL SIGNS: ED Triage Vitals  Enc Vitals Group     BP 06/28/16 0429 (!) 160/70     Pulse Rate 06/28/16 0429 64     Resp 06/28/16 0429 17     Temp 06/28/16 0429 98 F (36.7 C)     Temp  Source 06/28/16 0429 Oral     SpO2 06/28/16 0429 100 %     Weight 06/28/16 0418 120 lb (54.4 kg)     Height 06/28/16 0418 '5\' 5"'$  (1.651 m)     Head Circumference --      Peak Flow --      Pain Score 06/28/16 0417 8     Pain Loc --      Pain Edu? --      Excl. in [place]? --     Constitutional: Alert and oriented. Well appearing and in no apparent distress. HEENT:      Head: Normocephalic and atraumatic.         Eyes: Conjunctivae are normal. Sclera is non-icteric. EOMI. PERRL      Mouth/Throat: Mucous membranes are dry.       Neck: Supple with no signs of meningismus. Cardiovascular: Regular rate and rhythm. No murmurs, gallops, or rubs. 2+ symmetrical distal pulses are present in all extremities. No JVD. Respiratory: Normal respiratory effort. Lungs are clear to auscultation bilaterally. No wheezes, crackles, or rhonchi.  Gastrointestinal: Soft, non tender, and non distended with positive bowel sounds. No rebound or guarding. Genitourinary: No CVA tenderness. Musculoskeletal: Nontender with normal range of motion in all extremities. No edema, cyanosis, or erythema of  extremities. Neurologic: Normal speech and language. Face is symmetric. Moving all extremities. No gross focal neurologic deficits are appreciated. Skin: Skin is warm, dry and intact. No rash noted. Psychiatric: Mood and affect are normal. Speech and behavior are normal.  ____________________________________________   LABS (all labs ordered are listed, but only abnormal results are displayed)  Labs Reviewed  BASIC METABOLIC PANEL - Abnormal; Notable for the following:       Result Value   Chloride 100 (*)    Glucose, Bld 108 (*)    All other components within normal limits  BRAIN NATRIURETIC PEPTIDE - Abnormal; Notable for the following:    B Natriuretic Peptide 750.0 (*)    All other components within normal limits  CBC  TROPONIN I   ____________________________________________  EKG  ED ECG REPORT I, Rudene Re, the attending physician, personally viewed and interpreted this ECG. Sinus rhythm, rate of 60, normal intervals, normal axis, ST depressions inferior and lateral leads which are new from prior. No ST elevations.  ____________________________________________  RADIOLOGY  CTA dissection: Negative ____________________________________________   PROCEDURES  Procedure(s) performed: None Procedures Critical Care performed: yes  CRITICAL CARE Performed by: Rudene Re  ?  Total critical care time: 35 min  Critical care time was exclusive of separately billable procedures and treating other patients.  Critical care was necessary to treat or prevent imminent or life-threatening deterioration.  Critical care was time spent personally by me on the following activities: development of treatment plan with patient and/or surrogate as well as nursing, discussions with consultants, evaluation of patient's response to treatment, examination of patient, obtaining history from patient or surrogate, ordering and performing treatments and interventions, ordering  and review of laboratory studies, ordering and review of radiographic studies, pulse oximetry and re-evaluation of patient's condition.  ____________________________________________   INITIAL IMPRESSION / ASSESSMENT AND PLAN / ED COURSE  75 y.o. female with a history of CHF, right breast cancer, COPD, hypertension, hyperlipidemia, anxiety who presents for evaluation of pain in her upper back between her shoulder blades, shortness of breath and fatigue. On exam patient is well-appearing, in no distress, her vital signs are within normal limits, patient has no swelling of her lower extremities,  lungs are clear to auscultation with no wheezing or crackles, patient is moving good air. EKG with new ST depressions which makes me concerned for ACS. She took two baby aspirins this evening, will give two more. Will put 1 inch of nitro paste in her chest. Will repeat EKG in 20 min. Will get troponin, BNP, CBC, BMP, CXR. Will watch on telemetry.  Clinical Course  Comment By Time  Patient reports that the pain is resolved with nitro paste. Troponin is negative. Will repeat EKG now that pain is gone. CXR concerning for atelectasis vs pneumonia however patient has no oxygen requirement, no WBC, no fever. Therefore will hold off on abx. I discussed patient with hospitalist for admission as patient has new EKG changes. Rudene Re, MD 08/11 [phone]  Repeat EKG now that patient is pain free shows improvement of depressions. Discussed with Dr. Marcille Blanco who suggested CT to rule out dissection. CT ordered.  Rudene Re, MD 08/11 [phone]  CT negative for PE, dissection, PNA. Will admit to Hospitalist for CP obs. Rudene Re, MD 08/11 [phone]    Pertinent labs & imaging results that were available during my care of the patient were reviewed by me and considered in my medical decision making (see chart for details).    ____________________________________________   FINAL CLINICAL IMPRESSION(S) / ED  DIAGNOSES  Final diagnoses:  Shortness of breath  Upper back pain  ST segment depression      NEW MEDICATIONS STARTED DURING THIS VISIT:  New Prescriptions   No medications on file     Note:  This document was prepared using Dragon voice recognition software and may include unintentional dictation errors.    Rudene Re, MD 06/28/16 [phone]

## 2016-06-28 NOTE — ED Notes (Signed)
Patient transported to CT 

## 2016-06-28 NOTE — Consult Note (Signed)
[facility] NAME: Robin Hudson    MR#:  [id number]  DATE OF BIRTH:  October 06, 1941  DATE OF ADMISSION:  06/28/2016  PRIMARY CARE PHYSICIAN: Adin Hector, MD   REQUESTING/REFERRING PHYSICIAN: dr Alfred Levins  CHIEF COMPLAINT:   Chief Complaint  Patient presents with  . Shortness of Breath    HISTORY OF PRESENT ILLNESS:  Robin Hudson  is a 74 y.o. female with a known history of Hypertension, COPD with ongoing tobacco abuse on home oxygen, chronic back pain and arthritis/rheumatoid, atherosclerotic coronary artery disease, hypertension comes to the emergency room after she started having increasing pain in her between her shoulder blades for last couple days. Patient said the pain radiated to her left upper shoulder denied any chest pain. She started all 6 parents and some shortness of breath for which she started using oxygen which she uses intermittently at home. In the emergency room patient was found to be hemodynamically stable. She has chronically elevated B-type natriuretic peptide. Her troponin was 0.03. EKG showed mild ST depression in 1-3 aVF v4r V5 V6 which appears more like LVH changes then show ST depression. She had Nitropaste put in. She also received some pain medication. Her back pain improved. Repeat EKG did not show any further changes. Since patient was feeling better a repeat troponin was done which was 0.03. Her back pain resolved and improved. She recently had a cardiac stress test done on 05/31/2015 as a preop clearance for her left wrist surgery. Stress test per cardiology Dr. Ubaldo Glassing was negative. -Patient did not experience any chest pain during her ER stay.   PAST MEDICAL HISTORY:   Past Medical History:  Diagnosis Date  . Allergic rhinitis   . Anxiety   . Asthma   . B12 deficiency   . Breast cancer ([place]) 1999   RT LUMPECTOMY  . Carcinoma of right breast treated with adjuvant chemotherapy ([place]) 1999    RT LUMPECTOMY  . CHF (congestive heart failure) ([place])   . Colon cancer ([place]) 2010  . Colon cancer ([place])   . COPD (chronic obstructive pulmonary disease) ([place])   . Hyperlipemia   . Hypertension   . MRSA pneumonia ([place])   . Osteoporosis   . Radiation 1999   BREAST CA  . Right adrenal mass ([place])     PAST SURGICAL HISTOIRY:   Past Surgical History:  Procedure Laterality Date  . ABDOMINAL HYSTERECTOMY    . APPENDECTOMY    . BREAST SURGERY Right    lumpectomy x2 with lymph nodes  . CARPAL TUNNEL RELEASE Left 06/04/2016   Procedure: CARPAL TUNNEL RELEASE;  Surgeon: Hessie Knows, MD;  Location: [facility] ORS;  Service: Orthopedics;  Laterality: Left;  . colectomy     partial  . IMAGE GUIDED SINUS SURGERY    . OPEN REDUCTION INTERNAL FIXATION (ORIF) DISTAL RADIAL FRACTURE Left 06/04/2016   Procedure: OPEN REDUCTION INTERNAL FIXATION (ORIF) DISTAL RADIAL FRACTURE;  Surgeon: Hessie Knows, MD;  Location: [facility] ORS;  Service: Orthopedics;  Laterality: Left;    SOCIAL HISTORY:   Social History  Substance Use Topics  . Smoking status: Current Some Day Smoker    Packs/day: 0.25    Types: Cigarettes  . Smokeless tobacco: Never Used  . Alcohol use No    FAMILY HISTORY:   Family History  Problem Relation Age of Onset  . Heart attack Mother   . Breast cancer Sister 76  . Breast cancer Sister 26  .  Breast cancer Other     DRUG ALLERGIES:   Allergies  Allergen Reactions  . Coreg [Carvedilol] Other (See Comments)    syncope    REVIEW OF SYSTEMS:   Review of Systems  Constitutional: Negative for chills, fever and weight loss.  HENT: Negative for ear discharge, ear pain and nosebleeds.   Eyes: Negative for blurred vision, pain and discharge.  Respiratory: Positive for shortness of breath. Negative for sputum production, wheezing and stridor.   Cardiovascular: Negative for chest pain, palpitations, orthopnea and PND.  Gastrointestinal: Negative for abdominal pain, diarrhea, nausea  and vomiting.  Genitourinary: Negative for frequency and urgency.  Musculoskeletal: Positive for back pain and joint pain.  Neurological: Positive for weakness. Negative for sensory change, speech change and focal weakness.  Psychiatric/Behavioral: Negative for depression and hallucinations. The patient is not nervous/anxious.    MEDICATIONS AT HOME:   Prior to Admission medications   Medication Sig Start Date End Date Taking? Authorizing Provider  ALPRAZolam Duanne Moron) 0.5 MG tablet Take 1 tablet by mouth 2 (two) times daily as needed for anxiety.  04/19/15  Yes Historical Provider, MD  atenolol (TENORMIN) 50 MG tablet Take 1 tablet (50 mg total) by mouth daily. 05/18/15  Yes Tama High III, MD  cholecalciferol (VITAMIN D) 1000 units tablet Take 1,000 Units by mouth daily.   Yes Historical Provider, MD  escitalopram (LEXAPRO) 10 MG tablet Take 1 tablet by mouth daily. 04/12/15  Yes Historical Provider, MD  HYDROcodone-acetaminophen (NORCO) 10-325 MG per tablet Take 0.5-1 tablets by mouth 3 (three) times daily as needed for moderate pain or severe pain.  04/28/15  Yes Historical Provider, MD  hydroxychloroquine (PLAQUENIL) 200 MG tablet Take 400 mg by mouth daily. Reported on 12/06/2015   Yes Historical Provider, MD  losartan (COZAAR) 50 MG tablet Take 1 tablet (50 mg total) by mouth daily. 05/18/15  Yes Tama High III, MD  predniSONE (DELTASONE) 5 MG tablet Take 1 tablet (5 mg total) by mouth daily with breakfast. Start taking this 5 mg prednisone tablet after completing prednisone tapering dose 12/04/15  Yes Nicholes Mango, MD  PROAIR HFA 108 (90 BASE) MCG/ACT inhaler Take 2 puffs by mouth every 6 (six) hours as needed for wheezing.  04/12/15  Yes Historical Provider, MD  SPIRIVA HANDIHALER 18 MCG inhalation capsule Take 1 Inhaler by mouth daily. 04/24/15  Yes Historical Provider, MD  azelastine (ASTELIN) 0.1 % nasal spray Place 1 spray into both nostrils 2 (two) times daily. Use in each nostril as directed     Historical Provider, MD  predniSONE (STERAPRED UNI-PAK 21 TAB) 10 MG (21) TBPK tablet Take 1 tablet (10 mg total) by mouth daily. Take 6 tablets by mouth for 1 day followed by  5 tablets by mouth for 1 day followed by  4 tablets by mouth for 1 day followed by  3 tablets by mouth for 1 day followed by  2 tablets by mouth for 1 day followed by  1 tablet by mouth for a day followed by 5 mg of prednisone by mouth once daily(home medication) Patient not taking: Reported on 06/03/2016 12/04/15   Nicholes Mango, MD      VITAL SIGNS:  Blood pressure 127/63, pulse (!) 57, temperature 98 F (36.7 C), temperature source Oral, resp. rate 18, height '5\' 5"'$  (1.651 m), weight 54.4 kg (120 lb), SpO2 98 %.  PHYSICAL EXAMINATION:  GENERAL:  75 y.o.-year-old patient lying in the bed with no acute distress. thin EYES: Pupils equal,  round, reactive to light and accommodation. No scleral icterus. Extraocular muscles intact.  HEENT: Head atraumatic, normocephalic. Oropharynx and nasopharynx clear.  NECK:  Supple, no jugular venous distention. No thyroid enlargement, no tenderness.  LUNGS: distant breath sounds bilaterally, no wheezing, rales,rhonchi or crepitation. No use of accessory muscles of respiration.  CARDIOVASCULAR: S1, S2 normal. No murmurs, rubs, or gallops.  ABDOMEN: Soft, nontender, nondistended. Bowel sounds present. No organomegaly or mass.  EXTREMITIES: No pedal edema, cyanosis, or clubbing.  NEUROLOGIC: Cranial nerves II through XII are intact. Muscle strength 5/5 in all extremities. Sensation intact. Gait not checked.  PSYCHIATRIC: The patient is alert and oriented x 3.  SKIN: No obvious rash, lesion, or ulcer.   LABORATORY PANEL:   CBC  Recent Labs Lab 06/28/16 0450  WBC 8.1  HGB 14.2  HCT 41.1  PLT 232   ------------------------------------------------------------------------------------------------------------------  Chemistries   Recent Labs Lab 06/28/16 0450  NA 135  K  3.8  CL 100*  CO2 29  GLUCOSE 108*  BUN 10  CREATININE 0.66  CALCIUM 9.1   ------------------------------------------------------------------------------------------------------------------  Cardiac Enzymes  Recent Labs Lab 06/28/16 0752  TROPONINI <0.03   ------------------------------------------------------------------------------------------------------------------  RADIOLOGY:  Dg Chest Portable 1 View  Result Date: 06/28/2016 CLINICAL DATA:  Acute onset of shortness of breath and generalized weakness. Initial encounter. EXAM: PORTABLE CHEST 1 VIEW COMPARISON:  Chest radiograph performed 12/05/2015 FINDINGS: The lungs are well-aerated. Mild left basilar opacity may reflect atelectasis or possibly mild infection. Right apical scarring and pleural thickening are again noted, stable in appearance. No pleural effusion or pneumothorax is identified. The cardiomediastinal silhouette is within normal limits. No acute osseous abnormalities are seen. IMPRESSION: 1. Mild left basilar airspace opacity may reflect atelectasis or possibly mild infection. 2. Right apical scarring and pleural thickening are stable in appearance. Electronically Signed   By: Garald Balding M.D.   On: 06/28/2016 04:54   Ct Angio Chest Aorta W And/or Wo Contrast  Result Date: 06/28/2016 CLINICAL DATA:  Acute onset of nausea and shortness of breath. High blood pressure. Assess for aortic dissection. Initial encounter. EXAM: CT ANGIOGRAPHY CHEST WITH CONTRAST TECHNIQUE: Multidetector CT imaging of the chest was performed using the standard protocol during bolus administration of intravenous contrast. Multiplanar CT image reconstructions and MIPs were obtained to evaluate the vascular anatomy. CONTRAST:  100 mL of Isovue 370 IV contrast COMPARISON:  Chest radiograph performed earlier today at 4:20 a.m., and CTA of the chest performed 09/23/2015 FINDINGS: There is no evidence of aortic dissection. There is no evidence of  aneurysmal dilatation. Scattered calcification is seen along the aortic arch and descending thoracic aorta. The great vessels are grossly unremarkable in appearance. There is no evidence of pulmonary embolus. Biapical scarring is noted, with a 5 mm nodule at the left lung apex. Underlying emphysematous change is noted bilaterally, most prominent at the upper lung lobes. There is no evidence of pleural effusion or pneumothorax. No dominant masses are identified; no abnormal focal contrast enhancement is seen. Scattered coronary artery calcifications are seen. No mediastinal lymphadenopathy is seen. No pericardial effusion is identified. No axillary lymphadenopathy is seen. The visualized portions of the thyroid gland are unremarkable in appearance. The visualized portions of the liver and spleen are unremarkable. The visualized portions of the pancreas, adrenal glands and kidneys are within normal limits. A 2.3 cm right adrenal nodule is stable in appearance from 2016 and likely benign. No acute osseous abnormalities are seen. Review of the MIP images confirms the above findings. IMPRESSION:  1. No evidence of aortic dissection. No evidence of aneurysmal dilatation. Scattered calcification along the aortic arch and descending thoracic aorta. 2. No evidence for pulmonary embolus. 3. Biapical scarring, with a 5 mm nodule at the left lung apex. No follow-up needed if patient is low-risk. Non-contrast chest CT can be considered in 12 months if patient is high-risk. This recommendation follows the consensus statement: Guidelines for Management of Incidental Pulmonary Nodules Detected on CT Images:From the Fleischner Society 2017; published online before print (10.1148/radiol.5400867619). 4. Underlying emphysematous change noted bilaterally, most prominent at the upper lung lobes. 5. Scattered coronary artery calcifications seen. 6. Stable 2.3 cm right adrenal nodule is likely benign. Electronically Signed   By: Garald Balding M.D.   On: 06/28/2016 06:35    EKG:   Orders placed or performed during the hospital encounter of 06/28/16  . ED EKG  . ED EKG  . EKG 12-Lead  . EKG 12-Lead  . ED EKG  . ED EKG  . EKG 12-Lead  . EKG 12-Lead  . EKG 12-Lead  . EKG 12-Lead    IMPRESSION AND PLAN:  Robin Hudson  is a 75 y.o. female with a known history of Hypertension, COPD with ongoing tobacco abuse on home oxygen, chronic back pain and arthritis/rheumatoid, atherosclerotic coronary artery disease, hypertension comes to the emergency room after she started having increasing pain in her between her shoulder blades for last couple days. Patient said the pain radiated to her left upper shoulder denied any chest pain.  1. Back pain with history of atherosclerotic coronary artery disease and abnormal stress test in July 2017 -Appears atypical -Ruled out MI with 2 sets of negative cardiac enzymes -EKG changes noted however  Appears like LVH changes. -Patient feels better during her ER stay. -spoke with cardiology Dr. Ubaldo Glassing who will see. Patient is  Agreeable -continue home meds will add a daily aspirin  2. Chronic COPD on home oxygen Patient advised smoking cessation  3. Hypertension continue atenolol, losartan  4.Rheumatoid arthritis with chronic joint PAIN Patient on pLAQUENIL AND  Chronic prednisone  Overall stable. D/c home with f/u dr Ubaldo Glassing next week  All the records are reviewed and case discussed with Consulting provider. Management plans discussed with the patient, family and they are in agreement.  CODE STATUS: full  TOTAL TIME TAKING CARE OF THIS PATIENT: 50 minutes.    Shamar Engelmann M.D on 06/28/2016 at 9:11 AM  Between 7am to 6pm - Pager - [phone]  After 6pm go to [email or web address] - password [email or web address] [facility]  Office  [phone]  CC: Primary care Physician: Adin Hector, MD

## 2016-06-28 NOTE — Discharge Instructions (Signed)
Call dr Bethanne Ginger office if any back pain recurrens that does not improve or have unusual chest pain

## 2016-06-28 NOTE — ED Triage Notes (Addendum)
Pt to triage via w/c with O2 in place at 2l/min via [place]; pt reports SHOB tonight, discomfort to shoulders, took '81mg'$  ASA but pain persisted; took oxycodone without relief; pt taken immediately to room 25 by ED tech Aldona Bar, to be placed on card monitor for EKG reading

## 2016-07-02 DIAGNOSIS — I119 Hypertensive heart disease without heart failure: Secondary | ICD-10-CM | POA: Diagnosis not present

## 2016-07-02 DIAGNOSIS — I2510 Atherosclerotic heart disease of native coronary artery without angina pectoris: Secondary | ICD-10-CM | POA: Diagnosis not present

## 2016-07-02 DIAGNOSIS — J438 Other emphysema: Secondary | ICD-10-CM | POA: Diagnosis not present

## 2016-07-02 DIAGNOSIS — E538 Deficiency of other specified B group vitamins: Secondary | ICD-10-CM | POA: Diagnosis not present

## 2016-07-02 DIAGNOSIS — I10 Essential (primary) hypertension: Secondary | ICD-10-CM | POA: Diagnosis not present

## 2016-07-02 DIAGNOSIS — E782 Mixed hyperlipidemia: Secondary | ICD-10-CM | POA: Diagnosis not present

## 2016-07-02 DIAGNOSIS — I43 Cardiomyopathy in diseases classified elsewhere: Secondary | ICD-10-CM | POA: Diagnosis not present

## 2016-07-04 DIAGNOSIS — J449 Chronic obstructive pulmonary disease, unspecified: Secondary | ICD-10-CM | POA: Diagnosis not present

## 2016-07-05 DIAGNOSIS — Z8781 Personal history of (healed) traumatic fracture: Secondary | ICD-10-CM | POA: Diagnosis not present

## 2016-07-05 DIAGNOSIS — Z967 Presence of other bone and tendon implants: Secondary | ICD-10-CM | POA: Diagnosis not present

## 2016-07-08 DIAGNOSIS — M4806 Spinal stenosis, lumbar region: Secondary | ICD-10-CM | POA: Diagnosis not present

## 2016-07-08 DIAGNOSIS — M5416 Radiculopathy, lumbar region: Secondary | ICD-10-CM | POA: Diagnosis not present

## 2016-07-08 DIAGNOSIS — M47816 Spondylosis without myelopathy or radiculopathy, lumbar region: Secondary | ICD-10-CM | POA: Diagnosis not present

## 2016-07-15 DIAGNOSIS — J449 Chronic obstructive pulmonary disease, unspecified: Secondary | ICD-10-CM | POA: Diagnosis not present

## 2016-07-15 DIAGNOSIS — J069 Acute upper respiratory infection, unspecified: Secondary | ICD-10-CM | POA: Diagnosis not present

## 2016-07-15 DIAGNOSIS — J438 Other emphysema: Secondary | ICD-10-CM | POA: Diagnosis not present

## 2016-07-19 ENCOUNTER — Observation Stay
Admission: EM | Admit: 2016-07-19 | Discharge: 2016-07-20 | Disposition: A | Discharge status: Home / Self Care | Payer: PPO | Attending: Specialist | Admitting: Specialist

## 2016-07-19 ENCOUNTER — Emergency Department: Payer: PPO

## 2016-07-19 ENCOUNTER — Encounter: Payer: Self-pay | Admitting: Emergency Medicine

## 2016-07-19 DIAGNOSIS — Z8614 Personal history of Methicillin resistant Staphylococcus aureus infection: Secondary | ICD-10-CM | POA: Insufficient documentation

## 2016-07-19 DIAGNOSIS — Z90710 Acquired absence of both cervix and uterus: Secondary | ICD-10-CM | POA: Insufficient documentation

## 2016-07-19 DIAGNOSIS — J441 Chronic obstructive pulmonary disease with (acute) exacerbation: Principal | ICD-10-CM | POA: Insufficient documentation

## 2016-07-19 DIAGNOSIS — Z9221 Personal history of antineoplastic chemotherapy: Secondary | ICD-10-CM | POA: Diagnosis not present

## 2016-07-19 DIAGNOSIS — Z7952 Long term (current) use of systemic steroids: Secondary | ICD-10-CM | POA: Insufficient documentation

## 2016-07-19 DIAGNOSIS — Z923 Personal history of irradiation: Secondary | ICD-10-CM | POA: Diagnosis not present

## 2016-07-19 DIAGNOSIS — Z9049 Acquired absence of other specified parts of digestive tract: Secondary | ICD-10-CM | POA: Insufficient documentation

## 2016-07-19 DIAGNOSIS — F419 Anxiety disorder, unspecified: Secondary | ICD-10-CM | POA: Insufficient documentation

## 2016-07-19 DIAGNOSIS — Z9109 Other allergy status, other than to drugs and biological substances: Secondary | ICD-10-CM | POA: Diagnosis not present

## 2016-07-19 DIAGNOSIS — Z803 Family history of malignant neoplasm of breast: Secondary | ICD-10-CM | POA: Insufficient documentation

## 2016-07-19 DIAGNOSIS — I509 Heart failure, unspecified: Secondary | ICD-10-CM | POA: Insufficient documentation

## 2016-07-19 DIAGNOSIS — I110 Hypertensive heart disease with heart failure: Secondary | ICD-10-CM | POA: Insufficient documentation

## 2016-07-19 DIAGNOSIS — M069 Rheumatoid arthritis, unspecified: Secondary | ICD-10-CM | POA: Diagnosis not present

## 2016-07-19 DIAGNOSIS — I10 Essential (primary) hypertension: Secondary | ICD-10-CM | POA: Diagnosis not present

## 2016-07-19 DIAGNOSIS — Z9889 Other specified postprocedural states: Secondary | ICD-10-CM | POA: Diagnosis not present

## 2016-07-19 DIAGNOSIS — Z85038 Personal history of other malignant neoplasm of large intestine: Secondary | ICD-10-CM | POA: Insufficient documentation

## 2016-07-19 DIAGNOSIS — M810 Age-related osteoporosis without current pathological fracture: Secondary | ICD-10-CM | POA: Insufficient documentation

## 2016-07-19 DIAGNOSIS — F329 Major depressive disorder, single episode, unspecified: Secondary | ICD-10-CM | POA: Insufficient documentation

## 2016-07-19 DIAGNOSIS — F17210 Nicotine dependence, cigarettes, uncomplicated: Secondary | ICD-10-CM | POA: Insufficient documentation

## 2016-07-19 DIAGNOSIS — Z79891 Long term (current) use of opiate analgesic: Secondary | ICD-10-CM | POA: Insufficient documentation

## 2016-07-19 DIAGNOSIS — Z7982 Long term (current) use of aspirin: Secondary | ICD-10-CM | POA: Diagnosis not present

## 2016-07-19 DIAGNOSIS — E785 Hyperlipidemia, unspecified: Secondary | ICD-10-CM | POA: Insufficient documentation

## 2016-07-19 DIAGNOSIS — Z853 Personal history of malignant neoplasm of breast: Secondary | ICD-10-CM | POA: Diagnosis not present

## 2016-07-19 DIAGNOSIS — Z7951 Long term (current) use of inhaled steroids: Secondary | ICD-10-CM | POA: Insufficient documentation

## 2016-07-19 DIAGNOSIS — E278 Other specified disorders of adrenal gland: Secondary | ICD-10-CM | POA: Diagnosis not present

## 2016-07-19 DIAGNOSIS — R0602 Shortness of breath: Secondary | ICD-10-CM | POA: Diagnosis not present

## 2016-07-19 DIAGNOSIS — Z79899 Other long term (current) drug therapy: Secondary | ICD-10-CM | POA: Insufficient documentation

## 2016-07-19 DIAGNOSIS — Z8249 Family history of ischemic heart disease and other diseases of the circulatory system: Secondary | ICD-10-CM | POA: Insufficient documentation

## 2016-07-19 LAB — BASIC METABOLIC PANEL
ANION GAP: 10 (ref 5–15)
BUN: 9 mg/dL (ref 6–20)
CHLORIDE: 99 mmol/L — AB (ref 101–111)
CO2: 27 mmol/L (ref 22–32)
CREATININE: 0.59 mg/dL (ref 0.44–1.00)
Calcium: 9.2 mg/dL (ref 8.9–10.3)
GFR calc non Af Amer: >60 mL/min (ref >60)
GLUCOSE: 124 mg/dL — AB (ref 65–99)
Potassium: 3.7 mmol/L (ref 3.5–5.1)
Sodium: 136 mmol/L (ref 135–145)

## 2016-07-19 LAB — MAGNESIUM: Magnesium: 1.9 mg/dL (ref 1.7–2.4)

## 2016-07-19 LAB — TROPONIN I: Troponin I: <0.03 ng/mL (ref <0.03)

## 2016-07-19 LAB — PHOSPHORUS: PHOSPHORUS: 3 mg/dL (ref 2.5–4.6)

## 2016-07-19 LAB — CBC
HCT: 38.1 % (ref 35.0–47.0)
HEMOGLOBIN: 12.8 g/dL (ref 12.0–16.0)
MCH: 27.8 pg (ref 26.0–34.0)
MCHC: 33.6 g/dL (ref 32.0–36.0)
MCV: 82.7 fL (ref 80.0–100.0)
Platelets: 287 10*3/uL (ref 150–440)
RBC: 4.6 MIL/uL (ref 3.80–5.20)
RDW: 13.9 % (ref 11.5–14.5)
WBC: 7.5 10*3/uL (ref 3.6–11.0)

## 2016-07-19 MED ORDER — IPRATROPIUM-ALBUTEROL 0.5-2.5 (3) MG/3ML IN SOLN: 3.0000 mL | RESPIRATORY_TRACT | Status: DC | PRN

## 2016-07-19 MED ORDER — ENOXAPARIN SODIUM 40 MG/0.4ML ~~LOC~~ SOLN: 40.0000 mg | Freq: Every day | SUBCUTANEOUS | Status: DC

## 2016-07-19 MED ORDER — LOSARTAN POTASSIUM 50 MG PO TABS
50.0000 mg | ORAL_TABLET | Freq: Every day | ORAL | Status: DC
  Administered 2016-07-20: 50 mg via ORAL
  Filled 2016-07-19: qty 1

## 2016-07-19 MED ORDER — HYDROCODONE-ACETAMINOPHEN 10-325 MG PO TABS
0.5000 | ORAL_TABLET | Freq: Three times a day (TID) | ORAL | Status: DC | PRN
  Administered 2016-07-19: 0.5 via ORAL
  Filled 2016-07-19: qty 1

## 2016-07-19 MED ORDER — IPRATROPIUM-ALBUTEROL 0.5-2.5 (3) MG/3ML IN SOLN
3.0000 mL | Freq: Once | RESPIRATORY_TRACT | Status: AC
  Administered 2016-07-19: 3 mL via RESPIRATORY_TRACT
  Filled 2016-07-19: qty 3

## 2016-07-19 MED ORDER — ALBUTEROL SULFATE (2.5 MG/3ML) 0.083% IN NEBU
2.5000 mg | INHALATION_SOLUTION | RESPIRATORY_TRACT | Status: DC | PRN
Start: 2016-07-19 — End: 2016-07-20

## 2016-07-19 MED ORDER — METHYLPREDNISOLONE SODIUM SUCC 125 MG IJ SOLR
60.0000 mg | Freq: Four times a day (QID) | INTRAMUSCULAR | Status: DC
  Administered 2016-07-20 (×2): 60 mg via INTRAVENOUS
  Filled 2016-07-19 (×2): qty 2

## 2016-07-19 MED ORDER — ASPIRIN EC 81 MG PO TBEC
81.0000 mg | DELAYED_RELEASE_TABLET | Freq: Every day | ORAL | Status: DC
  Administered 2016-07-20: 81 mg via ORAL
  Filled 2016-07-19: qty 1

## 2016-07-19 MED ORDER — ALPRAZOLAM 0.5 MG PO TABS
ORAL_TABLET | ORAL | Status: AC
  Administered 2016-07-19: 0.5 mg via ORAL
  Filled 2016-07-19: qty 1

## 2016-07-19 MED ORDER — HYDROXYCHLOROQUINE SULFATE 200 MG PO TABS
400.0000 mg | ORAL_TABLET | Freq: Every day | ORAL | Status: DC
  Administered 2016-07-20: 400 mg via ORAL
  Filled 2016-07-19: qty 2

## 2016-07-19 MED ORDER — MAGNESIUM CITRATE PO SOLN: 1.0000 | Freq: Once | ORAL | Status: DC | PRN

## 2016-07-19 MED ORDER — DM-GUAIFENESIN ER 30-600 MG PO TB12
1.0000 | ORAL_TABLET | Freq: Two times a day (BID) | ORAL | Status: DC
  Filled 2016-07-19: qty 1

## 2016-07-19 MED ORDER — ATENOLOL 50 MG PO TABS
50.0000 mg | ORAL_TABLET | Freq: Every day | ORAL | Status: DC
  Administered 2016-07-20: 50 mg via ORAL
  Filled 2016-07-19: qty 1

## 2016-07-19 MED ORDER — SODIUM CHLORIDE 0.9 % IV SOLN
INTRAVENOUS | Status: DC
  Administered 2016-07-20: via INTRAVENOUS

## 2016-07-19 MED ORDER — ACETAMINOPHEN 650 MG RE SUPP: 650.0000 mg | Freq: Four times a day (QID) | RECTAL | Status: DC | PRN

## 2016-07-19 MED ORDER — ONDANSETRON HCL 4 MG PO TABS: 4.0000 mg | ORAL_TABLET | Freq: Four times a day (QID) | ORAL | Status: DC | PRN

## 2016-07-19 MED ORDER — ACETAMINOPHEN 325 MG PO TABS: 650.0000 mg | ORAL_TABLET | Freq: Four times a day (QID) | ORAL | Status: DC | PRN

## 2016-07-19 MED ORDER — ALPRAZOLAM 0.5 MG PO TABS
0.5000 mg | ORAL_TABLET | Freq: Two times a day (BID) | ORAL | Status: DC | PRN
  Administered 2016-07-20: 0.5 mg via ORAL
  Filled 2016-07-19: qty 1

## 2016-07-19 MED ORDER — SENNOSIDES-DOCUSATE SODIUM 8.6-50 MG PO TABS: 1.0000 | ORAL_TABLET | Freq: Every evening | ORAL | Status: DC | PRN

## 2016-07-19 MED ORDER — ESCITALOPRAM OXALATE 10 MG PO TABS
10.0000 mg | ORAL_TABLET | Freq: Every day | ORAL | Status: DC
  Administered 2016-07-20: 10 mg via ORAL
  Filled 2016-07-19: qty 1

## 2016-07-19 MED ORDER — ZOLPIDEM TARTRATE 5 MG PO TABS: 5.0000 mg | ORAL_TABLET | Freq: Every evening | ORAL | Status: DC | PRN

## 2016-07-19 MED ORDER — METHYLPREDNISOLONE SODIUM SUCC 125 MG IJ SOLR
125.0000 mg | Freq: Once | INTRAMUSCULAR | Status: AC
  Administered 2016-07-19: 125 mg via INTRAVENOUS
  Filled 2016-07-19: qty 2

## 2016-07-19 MED ORDER — BISACODYL 5 MG PO TBEC: 5.0000 mg | DELAYED_RELEASE_TABLET | Freq: Every day | ORAL | Status: DC | PRN

## 2016-07-19 MED ORDER — ALPRAZOLAM 0.5 MG PO TABS
0.5000 mg | ORAL_TABLET | Freq: Once | ORAL | Status: AC
  Administered 2016-07-19: 0.5 mg via ORAL

## 2016-07-19 MED ORDER — VITAMIN D 1000 UNITS PO TABS
1000.0000 [IU] | ORAL_TABLET | Freq: Every day | ORAL | Status: DC
  Administered 2016-07-20: 1000 [IU] via ORAL
  Filled 2016-07-19: qty 1

## 2016-07-19 MED ORDER — AZELASTINE HCL 0.1 % NA SOLN
1.0000 | Freq: Two times a day (BID) | NASAL | Status: DC
  Administered 2016-07-20 (×2): 1 via NASAL
  Filled 2016-07-19: qty 30

## 2016-07-19 MED ORDER — ONDANSETRON HCL 4 MG/2ML IJ SOLN: 4.0000 mg | Freq: Four times a day (QID) | INTRAMUSCULAR | Status: DC | PRN

## 2016-07-19 MED ORDER — TIOTROPIUM BROMIDE MONOHYDRATE 18 MCG IN CAPS
18.0000 ug | ORAL_CAPSULE | Freq: Every day | RESPIRATORY_TRACT | Status: DC
  Administered 2016-07-20: 18 ug via RESPIRATORY_TRACT
  Filled 2016-07-19: qty 5

## 2016-07-19 NOTE — ED Triage Notes (Signed)
Pt states she had felt shob for several days. Pt states she was switched to atenolol from plain metoprolol and she feels like she I having a reaction to the medication. Pt states she has had to wear oxygen today which is not common for her. Lung sounds are diminshed in all fields. Pt denies pain.

## 2016-07-19 NOTE — ED Provider Notes (Signed)
[facility] Emergency Department Provider Note  Time seen: 9:16 PM  I have reviewed the triage vital signs and the nursing notes.   HISTORY  Chief Complaint Shortness of Breath    HPI Robin Hudson is a 75 y.o. female with a past medical history of COPD, hypertension, CHF, who presents the emergency department for shortness breath. According to the patient for the past 2 days she has had increased shortness of breath. Denies fever, cough, congestion or chest pain. Patient states she is prescribed oxygen as needed at home, she has been wearing 2 L today but continues to feel short of breath so she came to the department for evaluation. Patient states one of her medications was recently changed from atenolol to metoprolol, the patient thinks this could have something to do the shortness of breath. Patient is a current smoker. Describes her shortness of breath as mild currently, but moderate with exertion.  Past Medical History:  Diagnosis Date  . Allergic rhinitis   . Anxiety   . Asthma   . B12 deficiency   . Breast cancer ([place]) 1999   RT LUMPECTOMY  . Carcinoma of right breast treated with adjuvant chemotherapy ([place]) 1999   RT LUMPECTOMY  . CHF (congestive heart failure) ([place])   . Colon cancer ([place]) 2010  . Colon cancer ([place])   . COPD (chronic obstructive pulmonary disease) ([place])   . Hyperlipemia   . Hypertension   . MRSA pneumonia ([place])   . Osteoporosis   . Radiation 1999   BREAST CA  . Right adrenal mass [facility])     Patient Active Problem List   Diagnosis Date Noted  . Hypoxia 12/06/2015  . COPD exacerbation ([place]) 12/03/2015  . Elevated troponin 12/03/2015  . Elevated antinuclear antibody (ANA) level 05/15/2015  . Hyperlipemia 05/12/2015  . COPD (chronic obstructive pulmonary disease) ([place]) 05/12/2015  . HTN (hypertension) 05/12/2015  . Osteoporosis 05/12/2015  . Right adrenal mass ([place]) 05/12/2015  . B12 deficiency 05/12/2015  . Allergic  rhinitis 05/12/2015  . Breast cancer ([place]) 05/12/2015  . Colon cancer ([place]) 05/12/2015  . Closed head injury 12/15/2014    Past Surgical History:  Procedure Laterality Date  . ABDOMINAL HYSTERECTOMY    . APPENDECTOMY    . BREAST SURGERY Right    lumpectomy x2 with lymph nodes  . CARPAL TUNNEL RELEASE Left 06/04/2016   Procedure: CARPAL TUNNEL RELEASE;  Surgeon: Hessie Knows, MD;  Location: [facility] ORS;  Service: Orthopedics;  Laterality: Left;  . colectomy     partial  . IMAGE GUIDED SINUS SURGERY    . OPEN REDUCTION INTERNAL FIXATION (ORIF) DISTAL RADIAL FRACTURE Left 06/04/2016   Procedure: OPEN REDUCTION INTERNAL FIXATION (ORIF) DISTAL RADIAL FRACTURE;  Surgeon: Hessie Knows, MD;  Location: [facility] ORS;  Service: Orthopedics;  Laterality: Left;    Prior to Admission medications   Medication Sig Start Date End Date Taking? Authorizing Provider  ALPRAZolam Duanne Moron) 0.5 MG tablet Take 1 tablet by mouth 2 (two) times daily as needed for anxiety.  04/19/15   Historical Provider, MD  aspirin EC 81 MG tablet Take 1 tablet (81 mg total) by mouth daily. 06/28/16   Fritzi Mandes, MD  atenolol (TENORMIN) 50 MG tablet Take 1 tablet (50 mg total) by mouth daily. 05/18/15   Tama High III, MD  azelastine (ASTELIN) 0.1 % nasal spray Place 1 spray into both nostrils 2 (two) times daily. Use in each nostril as directed    Historical Provider, MD  cholecalciferol (VITAMIN D) 1000 units tablet Take 1,000 Units by mouth daily.    Historical Provider, MD  escitalopram (LEXAPRO) 10 MG tablet Take 1 tablet by mouth daily. 04/12/15   Historical Provider, MD  HYDROcodone-acetaminophen (NORCO) 10-325 MG per tablet Take 0.5-1 tablets by mouth 3 (three) times daily as needed for moderate pain or severe pain.  04/28/15   Historical Provider, MD  hydroxychloroquine (PLAQUENIL) 200 MG tablet Take 400 mg by mouth daily. Reported on 12/06/2015    Historical Provider, MD  losartan (COZAAR) 50 MG tablet Take 1 tablet (50 mg total) by  mouth daily. 05/18/15   Tama High III, MD  predniSONE (DELTASONE) 5 MG tablet Take 1 tablet (5 mg total) by mouth daily with breakfast. Start taking this 5 mg prednisone tablet after completing prednisone tapering dose 12/04/15   Nicholes Mango, MD  predniSONE (STERAPRED UNI-PAK 21 TAB) 10 MG (21) TBPK tablet Take 1 tablet (10 mg total) by mouth daily. Take 6 tablets by mouth for 1 day followed by  5 tablets by mouth for 1 day followed by  4 tablets by mouth for 1 day followed by  3 tablets by mouth for 1 day followed by  2 tablets by mouth for 1 day followed by  1 tablet by mouth for a day followed by 5 mg of prednisone by mouth once daily(home medication) Patient not taking: Reported on 06/03/2016 12/04/15   Nicholes Mango, MD  PROAIR HFA 108 (90 BASE) MCG/ACT inhaler Take 2 puffs by mouth every 6 (six) hours as needed for wheezing.  04/12/15   Historical Provider, MD  SPIRIVA HANDIHALER 18 MCG inhalation capsule Take 1 Inhaler by mouth daily. 04/24/15   Historical Provider, MD    Allergies  Allergen Reactions  . Coreg [Carvedilol] Other (See Comments)    syncope    Family History  Problem Relation Age of Onset  . Heart attack Mother   . Breast cancer Other   . Breast cancer Sister 43  . Breast cancer Sister 49    Social History Social History  Substance Use Topics  . Smoking status: Current Some Day Smoker    Packs/day: 0.25    Types: Cigarettes  . Smokeless tobacco: Never Used  . Alcohol use No    Review of Systems Constitutional: Negative for fever Cardiovascular: Negative for chest pain. Respiratory: Positive for shortness of breath. Gastrointestinal: Negative for abdominal pain Musculoskeletal: Negative for leg swelling. Neurological: Negative for headache 10-point ROS otherwise negative.  ____________________________________________   PHYSICAL EXAM:  VITAL SIGNS: ED Triage Vitals [07/19/16 2036]  Enc Vitals Group     BP (!) 160/92     Pulse Rate 100     Resp (!)  24     Temp 98.1 F (36.7 C)     Temp src      SpO2 96 %     Weight 117 lb (53.1 kg)     Height '5\' 5"'$  (1.651 m)     Head Circumference      Peak Flow      Pain Score 0     Pain Loc      Pain Edu?      Excl. in [place]?     Constitutional: Alert and oriented. Well appearing and in no distress. Eyes: Normal exam ENT   Head: Normocephalic and atraumatic.   Mouth/Throat: Mucous membranes are moist. Cardiovascular: Normal rate, regular rhythm. No murmur Respiratory: Mild tachypnea, moderate expiratory wheeze bilaterally. No rales, rhonchi. Gastrointestinal: Soft  and nontender. No distention. Musculoskeletal: Nontender with normal range of motion in all extremities. No lower extremity tenderness or edema. Neurologic:  Normal speech and language. No gross focal neurologic deficits  Skin:  Skin is warm, dry and intact.  Psychiatric: Mood and affect are normal. Speech and behavior are normal.   ____________________________________________    EKG  EKG reviewed and interpreted by myself  ____________________________________________    RADIOLOGY  Chest x-ray shows no acute change.  ____________________________________________   INITIAL IMPRESSION / ASSESSMENT AND PLAN / ED COURSE  Pertinent labs & imaging results that were available during my care of the patient were reviewed by me and considered in my medical decision making (see chart for details).  The patient presents the emergency department for shortness of breath worsening over the past 3 days. On exam the patient is expiratory wheeze bilaterally most consistent with COPD exacerbation. We will check labs, chest x-ray, does do an absent Solu-Medrol closing monitor in the emergency department.  Labs are largely within normal limits. Chest x-ray clear.   Patient continues with mild to moderate wheezes after treatment. Satting 96% on 2 L currently. I discussed with the patient going home versus admission to the hospital,  the patient states she does not feel safe going home at this point with her shortness of breath continued, we'll admit the patient to the hospitalist service for further treatment. Patient agreeable to plan.  ____________________________________________   FINAL CLINICAL IMPRESSION(S) / ED DIAGNOSES  COPD exacerbation    Harvest Dark, MD 07/19/16 2202

## 2016-07-19 NOTE — ED Notes (Signed)
Report to grace, rn. Pt to room with ed tech for ekg and iv initiation.

## 2016-07-20 DIAGNOSIS — I509 Heart failure, unspecified: Secondary | ICD-10-CM | POA: Diagnosis not present

## 2016-07-20 DIAGNOSIS — J441 Chronic obstructive pulmonary disease with (acute) exacerbation: Secondary | ICD-10-CM | POA: Diagnosis not present

## 2016-07-20 DIAGNOSIS — I10 Essential (primary) hypertension: Secondary | ICD-10-CM | POA: Diagnosis not present

## 2016-07-20 LAB — BASIC METABOLIC PANEL
Anion gap: 10 (ref 5–15)
BUN: 9 mg/dL (ref 6–20)
CHLORIDE: 100 mmol/L — AB (ref 101–111)
CO2: 27 mmol/L (ref 22–32)
CREATININE: 0.57 mg/dL (ref 0.44–1.00)
Calcium: 9.1 mg/dL (ref 8.9–10.3)
GFR calc Af Amer: >60 mL/min (ref >60)
GFR calc non Af Amer: >60 mL/min (ref >60)
Glucose, Bld: 161 mg/dL — ABNORMAL HIGH (ref 65–99)
Potassium: 4.2 mmol/L (ref 3.5–5.1)
Sodium: 137 mmol/L (ref 135–145)

## 2016-07-20 LAB — CBC
HCT: 38.4 % (ref 35.0–47.0)
Hemoglobin: 13.2 g/dL (ref 12.0–16.0)
MCH: 28.5 pg (ref 26.0–34.0)
MCHC: 34.3 g/dL (ref 32.0–36.0)
MCV: 83 fL (ref 80.0–100.0)
PLATELETS: 295 10*3/uL (ref 150–440)
RBC: 4.63 MIL/uL (ref 3.80–5.20)
RDW: 13.7 % (ref 11.5–14.5)
WBC: 5.1 10*3/uL (ref 3.6–11.0)

## 2016-07-20 LAB — MRSA PCR SCREENING: MRSA by PCR: NEGATIVE

## 2016-07-20 MED ORDER — GUAIFENESIN-DM 100-10 MG/5ML PO SYRP: 5.0000 mL | ORAL_SOLUTION | ORAL | Status: DC | PRN

## 2016-07-20 MED ORDER — PREDNISONE 10 MG PO TABS: ORAL_TABLET | ORAL | 0 refills | Status: DC

## 2016-07-20 MED ORDER — HYDROCODONE-ACETAMINOPHEN 5-325 MG PO TABS
1.0000 | ORAL_TABLET | Freq: Four times a day (QID) | ORAL | Status: DC | PRN
  Administered 2016-07-20 (×2): 1 via ORAL
  Filled 2016-07-20 (×2): qty 1

## 2016-07-20 NOTE — Progress Notes (Signed)
Pt complaining of back pain already had .05 of 10/325 Norco. Spoke with DR. Huglemyer may have 5/325 Norco Q6h. Prn for pain.

## 2016-07-20 NOTE — Care Management Obs Status (Signed)
[place] NOTIFICATION   Patient Details  Name: Robin Hudson MRN: [id number] Date of Birth: [birth year]   Medicare Observation Status Notification Given:  No (discharge order in less than 24 hours)    Ival Bible, RN 07/20/2016, 2:50 PM

## 2016-07-20 NOTE — Discharge Summary (Signed)
[facility] [place] NAME: Robin Hudson    MR#:  [id number]  DATE OF BIRTH:  04-02-1941  DATE OF ADMISSION:  07/19/2016 ADMITTING PHYSICIAN: Harvie Bridge, DO  DATE OF DISCHARGE:   PRIMARY CARE PHYSICIAN: BERT Briscoe Burns III, MD    ADMISSION DIAGNOSIS:  COPD exacerbation ([place]) [J44.1]  DISCHARGE DIAGNOSIS:  Active Problems:   COPD exacerbation ([place])   SECONDARY DIAGNOSIS:   Past Medical History:  Diagnosis Date  . Allergic rhinitis   . Anxiety   . Asthma   . B12 deficiency   . Breast cancer ([place]) 1999   RT LUMPECTOMY  . Carcinoma of right breast treated with adjuvant chemotherapy ([place]) 1999   RT LUMPECTOMY  . CHF (congestive heart failure) ([place])   . Colon cancer ([place]) 2010  . Colon cancer ([place])   . COPD (chronic obstructive pulmonary disease) ([place])   . Hyperlipemia   . Hypertension   . MRSA pneumonia ([place])   . Osteoporosis   . Radiation 1999   BREAST CA  . Right adrenal mass [facility])     HOSPITAL COURSE:   75 year old female with past medical history of COPD, CHF, hypertension, hyperlipidemia, osteoporosis, history of breast cancer who presented to the hospital due to shortness of breath and noted to be in COPD exacerbation.  1. COPD exacerbation-this was the cause of patient's shortness of breath. Patient was treated with IV steroids, scheduled DuoNeb's and has clinically improved. -She has no further wheezing/bronchospasm. She'll be discharged on oral prednisone taper, she will continue her Spiriva, Symbicort.  2. Essential hypertension-patient will continue her atenolol, Losartan.  3. History of rheumatoid arthritis-patient will continue her Plaquenil and resume her baseline prednisone after finishing her taper.  4. Anxiety-patient will continue her Xanax.  5. Depression - she will cont. Her Lexapro.   DISCHARGE CONDITIONS:   Stable  CONSULTS OBTAINED:    DRUG ALLERGIES:   Allergies  Allergen Reactions  .  Coreg [Carvedilol] Other (See Comments)    syncope    DISCHARGE MEDICATIONS:     Medication List    TAKE these medications   ALPRAZolam 0.5 MG tablet Commonly known as:  XANAX Take 1 tablet by mouth 2 (two) times daily as needed for anxiety.   aspirin EC 81 MG tablet Take 1 tablet (81 mg total) by mouth daily.   atenolol 50 MG tablet Commonly known as:  TENORMIN Take 1 tablet (50 mg total) by mouth daily.   azelastine 0.1 % nasal spray Commonly known as:  ASTELIN Place 1 spray into both nostrils 2 (two) times daily. Use in each nostril as directed   budesonide-formoterol 160-4.5 MCG/ACT inhaler Commonly known as:  SYMBICORT Inhale 2 puffs into the lungs 2 (two) times daily.   cholecalciferol 1000 units tablet Commonly known as:  VITAMIN D Take 1,000 Units by mouth daily.   escitalopram 10 MG tablet Commonly known as:  LEXAPRO Take 1 tablet by mouth daily.   HYDROcodone-acetaminophen 10-325 MG tablet Commonly known as:  NORCO Take 0.5-1 tablets by mouth 3 (three) times daily as needed for moderate pain or severe pain.   hydroxychloroquine 200 MG tablet Commonly known as:  PLAQUENIL Take 400 mg by mouth daily. Reported on 12/06/2015   losartan 50 MG tablet Commonly known as:  COZAAR Take 1 tablet (50 mg total) by mouth daily.   montelukast 10 MG tablet Commonly known as:  SINGULAIR Take 10 mg by mouth at bedtime.   predniSONE 5  MG tablet Commonly known as:  DELTASONE Take 1 tablet (5 mg total) by mouth daily with breakfast. Start taking this 5 mg prednisone tablet after completing prednisone tapering dose What changed:  Another medication with the same name was added. Make sure you understand how and when to take each.   predniSONE 10 MG tablet Commonly known as:  DELTASONE Label  & dispense according to the schedule below. 5 Pills PO for 1 day then, 4 Pills PO for 1 day, 3 Pills PO for 1 day, 2 Pills PO for 1 day, 1 Pill PO for 1 days then STOP. What  changed:  You were already taking a medication with the same name, and this prescription was added. Make sure you understand how and when to take each.   PROAIR HFA 108 (90 Base) MCG/ACT inhaler Generic drug:  albuterol Take 2 puffs by mouth every 6 (six) hours as needed for wheezing.   SPIRIVA HANDIHALER 18 MCG inhalation capsule Generic drug:  tiotropium Take 1 Inhaler by mouth daily.   tapentadol 50 MG tablet Commonly known as:  NUCYNTA Take 50 mg by mouth 2 (two) times daily.         DISCHARGE INSTRUCTIONS:   DIET:  Cardiac diet  DISCHARGE CONDITION:  Stable  ACTIVITY:  Activity as tolerated  OXYGEN:  Home Oxygen: Yes.     Oxygen Delivery: 2 liters/min via Patient connected to nasal cannula oxygen  DISCHARGE LOCATION:  home   If you experience worsening of your admission symptoms, develop shortness of breath, life threatening emergency, suicidal or homicidal thoughts you must seek medical attention immediately by calling 911 or calling your MD immediately  if symptoms less severe.  You Must read complete instructions/literature along with all the possible adverse reactions/side effects for all the Medicines you take and that have been prescribed to you. Take any new Medicines after you have completely understood and accpet all the possible adverse reactions/side effects.   Please note  You were cared for by a hospitalist during your hospital stay. If you have any questions about your discharge medications or the care you received while you were in the hospital after you are discharged, you can call the unit and asked to speak with the hospitalist on call if the hospitalist that took care of you is not available. Once you are discharged, your primary care physician will handle any further medical issues. Please note that NO REFILLS for any discharge medications will be authorized once you are discharged, as it is imperative that you return to your primary care physician  (or establish a relationship with a primary care physician if you do not have one) for your aftercare needs so that they can reassess your need for medications and monitor your lab values.     Today   Shortness of breath, wheezing much improved.  No other complaints presently.    VITAL SIGNS:  Blood pressure (!) 141/75, pulse 97, temperature 98.1 F (36.7 C), temperature source Oral, resp. rate 18, height '5\' 5"'$  (1.651 m), weight 53.1 kg (117 lb), SpO2 96 %.  I/O:   Intake/Output Summary (Last 24 hours) at 07/20/16 1323 Last data filed at 07/20/16 0941  Gross per 24 hour  Intake           236.25 ml  Output              800 ml  Net          -563.75 ml    PHYSICAL  EXAMINATION:  GENERAL:  75 y.o.-year-old patient lying in the bed with no acute distress.  EYES: Pupils equal, round, reactive to light and accommodation. No scleral icterus. Extraocular muscles intact.  HEENT: Head atraumatic, normocephalic. Oropharynx and nasopharynx clear.  NECK:  Supple, no jugular venous distention. No thyroid enlargement, no tenderness.  LUNGS: Normal breath sounds bilaterally, no wheezing, rales,rhonchi. No use of accessory muscles of respiration.  CARDIOVASCULAR: S1, S2 normal. No murmurs, rubs, or gallops.  ABDOMEN: Soft, non-tender, non-distended. Bowel sounds present. No organomegaly or mass.  EXTREMITIES: No pedal edema, cyanosis, or clubbing.  NEUROLOGIC: Cranial nerves II through XII are intact. No focal motor or sensory defecits b/l.  PSYCHIATRIC: The patient is alert and oriented x 3. Good affect.  SKIN: No obvious rash, lesion, or ulcer.   DATA REVIEW:   CBC  Recent Labs Lab 07/20/16 0402  WBC 5.1  HGB 13.2  HCT 38.4  PLT 295    Chemistries   Recent Labs Lab 07/19/16 2043 07/20/16 0402  NA 136 137  K 3.7 4.2  CL 99* 100*  CO2 27 27  GLUCOSE 124* 161*  BUN 9 9  CREATININE 0.59 0.57  CALCIUM 9.2 9.1  MG 1.9  --     Cardiac Enzymes  Recent Labs Lab  07/19/16 2043  TROPONINI <0.03    Microbiology Results  Results for orders placed or performed during the hospital encounter of 07/19/16  MRSA PCR Screening     Status: None   Collection Time: 07/20/16  4:07 AM  Result Value Ref Range Status   MRSA by PCR NEGATIVE NEGATIVE Final    Comment:        The GeneXpert MRSA Assay (FDA approved for NASAL specimens only), is one component of a comprehensive MRSA colonization surveillance program. It is not intended to diagnose MRSA infection nor to guide or monitor treatment for MRSA infections.     RADIOLOGY:  Dg Chest 2 View  Result Date: 07/19/2016 CLINICAL DATA:  Shortness of breath EXAM: CHEST  2 VIEW COMPARISON:  CTA chest dated 06/28/2016 FINDINGS: Biapical pleural-parenchymal scarring, right greater than left. No focal consolidation. No pleural effusion or pneumothorax. The heart is normal in size. Mild degenerative changes of the visualized thoracolumbar spine. IMPRESSION: No evidence of acute cardiopulmonary disease. Electronically Signed   By: Julian Hy M.D.   On: 07/19/2016 21:43      Management plans discussed with the patient, family and they are in agreement.  CODE STATUS:     Code Status Orders        Start     Ordered   07/19/16 2256  Full code  Continuous     07/19/16 2256    Code Status History    Date Active Date Inactive Code Status Order ID Comments User Context   12/06/2015  2:00 AM 12/07/2015  3:34 PM Full Code [id number]  Harrie Foreman, MD Inpatient   12/03/2015 12:49 AM 12/04/2015  5:21 PM Full Code [id number]  Lytle Butte, MD ED   05/12/2015  3:09 PM 05/18/2015 12:39 PM Full Code [id number]  Dustin Flock, MD Inpatient      TOTAL TIME TAKING CARE OF THIS PATIENT: 40 minutes.    Henreitta Leber M.D on 07/20/2016 at 1:23 PM  Between 7am to 6pm - Pager - [phone]  After 6pm go to [email or web address] - Proofreader  [facility]  Office   [phone]  CC: Primary care physician; Adin Hector, MD

## 2016-07-20 NOTE — Progress Notes (Signed)
Pt discharging to home per orders. States she feels much better. Still having intermittent chronic back pain with spasms, Norco and xanax given PRN with effect. Husband here with O2 for transport. Reviewed D/C instructions and meds with pt.

## 2016-07-20 NOTE — H&P (Signed)
[place] Admission History and Physical Harvie Bridge, D.O.  ---------------------------------------------------------------------------------------------------------------------   PATIENT NAME: Robin Hudson MR#: [id number] DATE OF BIRTH: [birth year] DATE OF ADMISSION: 07/19/2016 PRIMARY CARE PHYSICIAN: Adin Hector, MD  REQUESTING/REFERRING PHYSICIAN: ED Dr. Kerman Passey  CHIEF COMPLAINT: Chief Complaint  Patient presents with  . Shortness of Breath    HISTORY OF PRESENT ILLNESS: Robin Hudson is a 75 y.o. female with a known history of COPD, CHF, hypertension was in a usual state of health until 2 days ago when she began experiencing worsening shortness of breath, not responding to home regimen of nebulizers and requiring 24 hour use of home O2 which she usually only uses when necessary. Per patient she was started on doxycycline for sinus infection 5 days ago. She was also changed from atenolol to metoprolol because the pharmacy was out of her atenolol. She states that between those 2 things she began feeling fatigued, weak and increasingly short of breath until the past 2 days when her shortness of breath became acutely worse. She has a cough which is chronic and nonproductive.  Otherwise there has been no change in status. Patient has been taking medication as prescribed and there has been no recent change in medication or diet.  There has been no recent illness, travel or sick contacts.    Patient denies fevers/chills, weakness, dizziness, chest pain, N/V/C/D, abdominal pain, dysuria/frequency, changes in mental status.    PAST MEDICAL HISTORY: Past Medical History:  Diagnosis Date  . Allergic rhinitis   . Anxiety   . Asthma   . B12 deficiency   . Breast cancer ([place]) 1999   RT LUMPECTOMY  . Carcinoma of right breast treated with adjuvant chemotherapy ([place]) 1999   RT LUMPECTOMY  . CHF (congestive heart failure) ([place])   . Colon cancer ([place])  2010  . Colon cancer ([place])   . COPD (chronic obstructive pulmonary disease) ([place])   . Hyperlipemia   . Hypertension   . MRSA pneumonia ([place])   . Osteoporosis   . Radiation 1999   BREAST CA  . Right adrenal mass ([place])       PAST SURGICAL HISTORY: Past Surgical History:  Procedure Laterality Date  . ABDOMINAL HYSTERECTOMY    . APPENDECTOMY    . BREAST SURGERY Right    lumpectomy x2 with lymph nodes  . CARPAL TUNNEL RELEASE Left 06/04/2016   Procedure: CARPAL TUNNEL RELEASE;  Surgeon: Hessie Knows, MD;  Location: [facility] ORS;  Service: Orthopedics;  Laterality: Left;  . colectomy     partial  . IMAGE GUIDED SINUS SURGERY    . OPEN REDUCTION INTERNAL FIXATION (ORIF) DISTAL RADIAL FRACTURE Left 06/04/2016   Procedure: OPEN REDUCTION INTERNAL FIXATION (ORIF) DISTAL RADIAL FRACTURE;  Surgeon: Hessie Knows, MD;  Location: [facility] ORS;  Service: Orthopedics;  Laterality: Left;      SOCIAL HISTORY: Social History  Substance Use Topics  . Smoking status: Current Some Day Smoker    Packs/day: 0.25    Types: Cigarettes  . Smokeless tobacco: Never Used  . Alcohol use No      FAMILY HISTORY: Family History  Problem Relation Age of Onset  . Heart attack Mother   . Breast cancer Other   . Breast cancer Sister 59  . Breast cancer Sister 51     MEDICATIONS AT HOME: Prior to Admission medications   Medication Sig Start Date End Date Taking? Authorizing Provider  ALPRAZolam Duanne Moron) 0.5 MG tablet Take 1 tablet by  mouth 2 (two) times daily as needed for anxiety.  04/19/15  Yes Historical Provider, MD  aspirin EC 81 MG tablet Take 1 tablet (81 mg total) by mouth daily. 06/28/16  Yes Fritzi Mandes, MD  atenolol (TENORMIN) 50 MG tablet Take 1 tablet (50 mg total) by mouth daily. 05/18/15  Yes Tama High III, MD  azelastine (ASTELIN) 0.1 % nasal spray Place 1 spray into both nostrils 2 (two) times daily. Use in each nostril as directed   Yes Historical Provider, MD  budesonide-formoterol  (SYMBICORT) 160-4.5 MCG/ACT inhaler Inhale 2 puffs into the lungs 2 (two) times daily.   Yes Historical Provider, MD  cholecalciferol (VITAMIN D) 1000 units tablet Take 1,000 Units by mouth daily.   Yes Historical Provider, MD  escitalopram (LEXAPRO) 10 MG tablet Take 1 tablet by mouth daily. 04/12/15  Yes Historical Provider, MD  HYDROcodone-acetaminophen (NORCO) 10-325 MG per tablet Take 0.5-1 tablets by mouth 3 (three) times daily as needed for moderate pain or severe pain.  04/28/15  Yes Historical Provider, MD  hydroxychloroquine (PLAQUENIL) 200 MG tablet Take 400 mg by mouth daily. Reported on 12/06/2015   Yes Historical Provider, MD  losartan (COZAAR) 50 MG tablet Take 1 tablet (50 mg total) by mouth daily. 05/18/15  Yes Tama High III, MD  montelukast (SINGULAIR) 10 MG tablet Take 10 mg by mouth at bedtime.   Yes Historical Provider, MD  predniSONE (DELTASONE) 5 MG tablet Take 1 tablet (5 mg total) by mouth daily with breakfast. Start taking this 5 mg prednisone tablet after completing prednisone tapering dose 12/04/15  Yes Nicholes Mango, MD  PROAIR HFA 108 (90 BASE) MCG/ACT inhaler Take 2 puffs by mouth every 6 (six) hours as needed for wheezing.  04/12/15  Yes Historical Provider, MD  SPIRIVA HANDIHALER 18 MCG inhalation capsule Take 1 Inhaler by mouth daily. 04/24/15  Yes Historical Provider, MD  tapentadol (NUCYNTA) 50 MG tablet Take 50 mg by mouth 2 (two) times daily.   Yes Historical Provider, MD      DRUG ALLERGIES: Allergies  Allergen Reactions  . Coreg [Carvedilol] Other (See Comments)    syncope     REVIEW OF SYSTEMS: CONSTITUTIONAL: No fever/chills,  weight gain/loss, headache. Positive fatigue and weakness EYES: No blurry or double vision. ENT: No tinnitus, postnasal drip, redness or soreness of the oropharynx. RESPIRATORY: Positive cough, wheeze, dyspnea. CARDIOVASCULAR: No chest pain, orthopnea, palpitations, syncope. GASTROINTESTINAL: No nausea, vomiting, constipation,  diarrhea, abdominal pain, hematemesis, melena or hematochezia. GENITOURINARY: No dysuria or hematuria. ENDOCRINE: No polyuria or nocturia. No heat or cold intolerance. HEMATOLOGY: No anemia, bruising, bleeding. INTEGUMENTARY: No rashes, ulcers, lesions. MUSCULOSKELETAL: No arthritis, swelling, gout. NEUROLOGIC: No numbness, tingling, weakness or ataxia. No seizure-type activity. PSYCHIATRIC: No anxiety, depression, insomnia.  PHYSICAL EXAMINATION: VITAL SIGNS: Blood pressure (!) 143/75, pulse 98, temperature 97.8 F (36.6 C), temperature source Oral, resp. rate 16, height '5\' 5"'$  (1.651 m), weight 53.1 kg (117 lb), SpO2 95 %.  GENERAL: 75 y.o.-year-old white female patient, well-developed, well-nourished lying in the bed in no acute distress.  Pleasant and cooperative.   HEENT: Head atraumatic, normocephalic. Pupils equal, round, reactive to light and accommodation. No scleral icterus. Extraocular muscles intact. Nares are patent. Oropharynx is clear. Mucus membranes moist. NECK: Supple, full range of motion. No JVD, no bruit heard. No thyroid enlargement, no tenderness, no cervical lymphadenopathy. CHEST: Diffuse expiratory wheezing bilaterally. No wheezing, rales, rhonchi or crackles. No use of accessory muscles of respiration.  No reproducible chest wall  tenderness.  CARDIOVASCULAR: S1, S2 normal. No murmurs, rubs, or gallops. Cap refill <2 seconds. ABDOMEN: Soft, nontender, nondistended. No rebound, guarding, rigidity. Normoactive bowel sounds present in all four quadrants. No organomegaly or mass. EXTREMITIES: Full range of motion. No pedal edema, cyanosis, or clubbing. NEUROLOGIC: Cranial nerves II through XII are grossly intact with no focal sensorimotor deficit. Muscle strength 5/5 in all extremities. Sensation intact. Gait not checked. PSYCHIATRIC: The patient is alert and oriented x 3. Normal affect, mood, thought content. SKIN: Warm, dry, and intact without obvious rash, lesion, or  ulcer.  LABORATORY PANEL:  CBC  Recent Labs Lab 07/19/16 2043  WBC 7.5  HGB 12.8  HCT 38.1  PLT 287   ----------------------------------------------------------------------------------------------------------------- Chemistries  Recent Labs Lab 07/19/16 2043  NA 136  K 3.7  CL 99*  CO2 27  GLUCOSE 124*  BUN 9  CREATININE 0.59  CALCIUM 9.2  MG 1.9   ------------------------------------------------------------------------------------------------------------------ Cardiac Enzymes  Recent Labs Lab 07/19/16 2043  TROPONINI <0.03   ------------------------------------------------------------------------------------------------------------------  RADIOLOGY: Dg Chest 2 View  Result Date: 07/19/2016 CLINICAL DATA:  Shortness of breath EXAM: CHEST  2 VIEW COMPARISON:  CTA chest dated 06/28/2016 FINDINGS: Biapical pleural-parenchymal scarring, right greater than left. No focal consolidation. No pleural effusion or pneumothorax. The heart is normal in size. Mild degenerative changes of the visualized thoracolumbar spine. IMPRESSION: No evidence of acute cardiopulmonary disease. Electronically Signed   By: Julian Hy M.D.   On: 07/19/2016 21:43   IMPRESSION AND PLAN:  This is a 75 y.o. female with a history of COPD, CHF, hypertension now being admitted with: 1. COPD exacerbation -Admit for IV steroids, nebs, O2 to maintain sats, expectorants -We'll check sputum culture -Continue Spiriva, pro-air 2. Hypertension-continue atenolol and losartan 3. Otherwise continue home medications   Diet/Nutrition: Heart healthy Fluids: Hep-Lock DVT Px: Lovenox, SCDs and early ambulation Code Status: Full  All the records are reviewed and case discussed with ED provider. Management plans discussed with the patient and/or family who express understanding and agree with plan of care.   TOTAL TIME TAKING CARE OF THIS PATIENT: 60 minutes.   Robin Hudson D.O. on 07/20/2016 at  12:07 AM Between 7am to 6pm - Pager - [phone] After 6pm go to [email or web address] - Proofreader [facility] Office [phone] CC: Primary care physician; Adin Hector, MD     Note: This dictation was prepared with Dragon dictation along with smaller phrase technology. Any transcriptional errors that result from this process are unintentional.

## 2016-07-25 DIAGNOSIS — I10 Essential (primary) hypertension: Secondary | ICD-10-CM | POA: Diagnosis not present

## 2016-07-25 DIAGNOSIS — E538 Deficiency of other specified B group vitamins: Secondary | ICD-10-CM | POA: Diagnosis not present

## 2016-07-31 DIAGNOSIS — D518 Other vitamin B12 deficiency anemias: Secondary | ICD-10-CM | POA: Diagnosis not present

## 2016-07-31 DIAGNOSIS — I43 Cardiomyopathy in diseases classified elsewhere: Secondary | ICD-10-CM | POA: Diagnosis not present

## 2016-07-31 DIAGNOSIS — J438 Other emphysema: Secondary | ICD-10-CM | POA: Diagnosis not present

## 2016-07-31 DIAGNOSIS — Z85038 Personal history of other malignant neoplasm of large intestine: Secondary | ICD-10-CM | POA: Diagnosis not present

## 2016-07-31 DIAGNOSIS — E538 Deficiency of other specified B group vitamins: Secondary | ICD-10-CM | POA: Diagnosis not present

## 2016-07-31 DIAGNOSIS — I119 Hypertensive heart disease without heart failure: Secondary | ICD-10-CM | POA: Diagnosis not present

## 2016-07-31 DIAGNOSIS — I10 Essential (primary) hypertension: Secondary | ICD-10-CM | POA: Diagnosis not present

## 2016-07-31 DIAGNOSIS — E279 Disorder of adrenal gland, unspecified: Secondary | ICD-10-CM | POA: Diagnosis not present

## 2016-08-04 DIAGNOSIS — J449 Chronic obstructive pulmonary disease, unspecified: Secondary | ICD-10-CM | POA: Diagnosis not present

## 2016-08-07 DIAGNOSIS — E538 Deficiency of other specified B group vitamins: Secondary | ICD-10-CM | POA: Diagnosis not present

## 2016-08-07 DIAGNOSIS — M059 Rheumatoid arthritis with rheumatoid factor, unspecified: Secondary | ICD-10-CM | POA: Diagnosis not present

## 2016-08-07 DIAGNOSIS — M810 Age-related osteoporosis without current pathological fracture: Secondary | ICD-10-CM | POA: Diagnosis not present

## 2016-08-07 DIAGNOSIS — M546 Pain in thoracic spine: Secondary | ICD-10-CM | POA: Diagnosis not present

## 2016-08-14 DIAGNOSIS — M542 Cervicalgia: Secondary | ICD-10-CM | POA: Diagnosis not present

## 2016-08-14 DIAGNOSIS — G8929 Other chronic pain: Secondary | ICD-10-CM | POA: Diagnosis not present

## 2016-08-29 DIAGNOSIS — I10 Essential (primary) hypertension: Secondary | ICD-10-CM | POA: Diagnosis not present

## 2016-08-29 DIAGNOSIS — J9611 Chronic respiratory failure with hypoxia: Secondary | ICD-10-CM | POA: Diagnosis not present

## 2016-08-29 DIAGNOSIS — E279 Disorder of adrenal gland, unspecified: Secondary | ICD-10-CM | POA: Diagnosis not present

## 2016-08-29 DIAGNOSIS — R911 Solitary pulmonary nodule: Secondary | ICD-10-CM | POA: Diagnosis not present

## 2016-08-29 DIAGNOSIS — M1990 Unspecified osteoarthritis, unspecified site: Secondary | ICD-10-CM | POA: Diagnosis not present

## 2016-08-29 DIAGNOSIS — J438 Other emphysema: Secondary | ICD-10-CM | POA: Diagnosis not present

## 2016-08-29 DIAGNOSIS — E782 Mixed hyperlipidemia: Secondary | ICD-10-CM | POA: Diagnosis not present

## 2016-08-29 DIAGNOSIS — E538 Deficiency of other specified B group vitamins: Secondary | ICD-10-CM | POA: Diagnosis not present

## 2016-08-29 DIAGNOSIS — M5136 Other intervertebral disc degeneration, lumbar region: Secondary | ICD-10-CM | POA: Diagnosis not present

## 2016-08-29 DIAGNOSIS — I43 Cardiomyopathy in diseases classified elsewhere: Secondary | ICD-10-CM | POA: Diagnosis not present

## 2016-08-29 DIAGNOSIS — I119 Hypertensive heart disease without heart failure: Secondary | ICD-10-CM | POA: Diagnosis not present

## 2016-08-29 DIAGNOSIS — M810 Age-related osteoporosis without current pathological fracture: Secondary | ICD-10-CM | POA: Diagnosis not present

## 2016-08-29 DIAGNOSIS — I2510 Atherosclerotic heart disease of native coronary artery without angina pectoris: Secondary | ICD-10-CM | POA: Diagnosis not present

## 2016-08-29 DIAGNOSIS — Z23 Encounter for immunization: Secondary | ICD-10-CM | POA: Diagnosis not present

## 2016-09-02 ENCOUNTER — Ambulatory Visit: Payer: PPO | Attending: Internal Medicine

## 2016-09-03 DIAGNOSIS — J449 Chronic obstructive pulmonary disease, unspecified: Secondary | ICD-10-CM | POA: Diagnosis not present

## 2016-09-04 ENCOUNTER — Encounter: Payer: Self-pay | Admitting: Emergency Medicine

## 2016-09-04 ENCOUNTER — Emergency Department
Admission: EM | Admit: 2016-09-04 | Discharge: 2016-09-04 | Disposition: A | Discharge status: Home / Self Care | Payer: PPO | Attending: Emergency Medicine | Admitting: Emergency Medicine

## 2016-09-04 ENCOUNTER — Emergency Department: Payer: PPO

## 2016-09-04 DIAGNOSIS — Z79899 Other long term (current) drug therapy: Secondary | ICD-10-CM | POA: Insufficient documentation

## 2016-09-04 DIAGNOSIS — F17210 Nicotine dependence, cigarettes, uncomplicated: Secondary | ICD-10-CM | POA: Diagnosis not present

## 2016-09-04 DIAGNOSIS — Z85038 Personal history of other malignant neoplasm of large intestine: Secondary | ICD-10-CM | POA: Diagnosis not present

## 2016-09-04 DIAGNOSIS — Z7982 Long term (current) use of aspirin: Secondary | ICD-10-CM | POA: Insufficient documentation

## 2016-09-04 DIAGNOSIS — J441 Chronic obstructive pulmonary disease with (acute) exacerbation: Secondary | ICD-10-CM | POA: Insufficient documentation

## 2016-09-04 DIAGNOSIS — R531 Weakness: Secondary | ICD-10-CM | POA: Diagnosis not present

## 2016-09-04 DIAGNOSIS — I509 Heart failure, unspecified: Secondary | ICD-10-CM | POA: Insufficient documentation

## 2016-09-04 DIAGNOSIS — J45909 Unspecified asthma, uncomplicated: Secondary | ICD-10-CM | POA: Insufficient documentation

## 2016-09-04 DIAGNOSIS — J189 Pneumonia, unspecified organism: Secondary | ICD-10-CM | POA: Diagnosis not present

## 2016-09-04 DIAGNOSIS — R0602 Shortness of breath: Secondary | ICD-10-CM | POA: Diagnosis not present

## 2016-09-04 DIAGNOSIS — F17200 Nicotine dependence, unspecified, uncomplicated: Secondary | ICD-10-CM | POA: Diagnosis not present

## 2016-09-04 DIAGNOSIS — J181 Lobar pneumonia, unspecified organism: Secondary | ICD-10-CM | POA: Diagnosis not present

## 2016-09-04 DIAGNOSIS — R079 Chest pain, unspecified: Secondary | ICD-10-CM | POA: Diagnosis not present

## 2016-09-04 DIAGNOSIS — I110 Hypertensive heart disease with heart failure: Secondary | ICD-10-CM | POA: Diagnosis not present

## 2016-09-04 DIAGNOSIS — R911 Solitary pulmonary nodule: Secondary | ICD-10-CM

## 2016-09-04 DIAGNOSIS — Z853 Personal history of malignant neoplasm of breast: Secondary | ICD-10-CM | POA: Diagnosis not present

## 2016-09-04 LAB — COMPREHENSIVE METABOLIC PANEL
ALT: 10 U/L — AB (ref 14–54)
AST: 17 U/L (ref 15–41)
Albumin: 3.6 g/dL (ref 3.5–5.0)
Alkaline Phosphatase: 49 U/L (ref 38–126)
Anion gap: 6 (ref 5–15)
BILIRUBIN TOTAL: 0.4 mg/dL (ref 0.3–1.2)
BUN: 11 mg/dL (ref 6–20)
CHLORIDE: 98 mmol/L — AB (ref 101–111)
CO2: 33 mmol/L — ABNORMAL HIGH (ref 22–32)
CREATININE: 0.77 mg/dL (ref 0.44–1.00)
Calcium: 9.3 mg/dL (ref 8.9–10.3)
Glucose, Bld: 94 mg/dL (ref 65–99)
Potassium: 4.7 mmol/L (ref 3.5–5.1)
Sodium: 137 mmol/L (ref 135–145)
TOTAL PROTEIN: 6.9 g/dL (ref 6.5–8.1)

## 2016-09-04 LAB — CBC WITH DIFFERENTIAL/PLATELET
BASOS ABS: 0 10*3/uL (ref 0–0.1)
Basophils Relative: 0 %
EOS PCT: 1 %
Eosinophils Absolute: 0.1 10*3/uL (ref 0–0.7)
HEMATOCRIT: 36.2 % (ref 35.0–47.0)
Hemoglobin: 12.4 g/dL (ref 12.0–16.0)
LYMPHS ABS: 0.8 10*3/uL — AB (ref 1.0–3.6)
LYMPHS PCT: 8 %
MCH: 28.4 pg (ref 26.0–34.0)
MCHC: 34.2 g/dL (ref 32.0–36.0)
MCV: 83.1 fL (ref 80.0–100.0)
MONO ABS: 0.7 10*3/uL (ref 0.2–0.9)
Monocytes Relative: 7 %
NEUTROS ABS: 9.2 10*3/uL — AB (ref 1.4–6.5)
Neutrophils Relative %: 84 %
PLATELETS: 319 10*3/uL (ref 150–440)
RBC: 4.35 MIL/uL (ref 3.80–5.20)
RDW: 14.6 % — AB (ref 11.5–14.5)
WBC: 10.9 10*3/uL (ref 3.6–11.0)

## 2016-09-04 LAB — TROPONIN I

## 2016-09-04 MED ORDER — DOXYCYCLINE HYCLATE 100 MG PO CAPS: ORAL_CAPSULE | ORAL | 0 refills | Status: DC

## 2016-09-04 MED ORDER — DOXYCYCLINE HYCLATE 100 MG PO TABS
100.0000 mg | ORAL_TABLET | Freq: Once | ORAL | Status: AC
  Administered 2016-09-04: 100 mg via ORAL
  Filled 2016-09-04: qty 1

## 2016-09-04 NOTE — ED Triage Notes (Signed)
Reports generalized weakness, 20lb weight loss over the past month.  Reports right side chest pain off and on.

## 2016-09-04 NOTE — ED Provider Notes (Signed)
[facility] Emergency Department Provider Note  ____________________________________________   First MD Initiated Contact with Patient 09/04/16 1159     (approximate)  I have reviewed the triage vital signs and the nursing notes.   HISTORY  Chief Complaint Weakness    HPI Robin Hudson is a 75 y.o. female with a history of COPD and persistent smoking history who presents for evaluation of gradual onset generalized weakness over the last week.  She is not having any increased shortness of breath over baseline.  She is having some right-sided rib tenderness for which she has seen her primary care doctor.  She also reports that she has lost about 20 pounds over the last month where she also discussed with her PCP.  She also denies fever/chills, night sweats, abdominal pain, nausea, vomiting, diarrhea, dysuria.She reports that she has felt extremely fatigued recently and has been to sleep more than usual.  She has not had any dizziness or vertigo and just feels wiped out.  Those symptoms she describes as severe, the right-sided rib pain is intermittent and mild to moderate and worse with palpation.   Past Medical History:  Diagnosis Date  . Allergic rhinitis   . Anxiety   . Asthma   . B12 deficiency   . Breast cancer ([place]) 1999   RT LUMPECTOMY  . Carcinoma of right breast treated with adjuvant chemotherapy ([place]) 1999   RT LUMPECTOMY  . CHF (congestive heart failure) ([place])   . Colon cancer ([place]) 2010  . Colon cancer ([place])   . COPD (chronic obstructive pulmonary disease) ([place])   . Hyperlipemia   . Hypertension   . MRSA pneumonia ([place])   . Osteoporosis   . Radiation 1999   BREAST CA  . Right adrenal mass [facility])     Patient Active Problem List   Diagnosis Date Noted  . Hypoxia 12/06/2015  . COPD exacerbation ([place]) 12/03/2015  . Elevated troponin 12/03/2015  . Elevated antinuclear antibody (ANA) level 05/15/2015  . Hyperlipemia 05/12/2015  . COPD  (chronic obstructive pulmonary disease) ([place]) 05/12/2015  . HTN (hypertension) 05/12/2015  . Osteoporosis 05/12/2015  . Right adrenal mass ([place]) 05/12/2015  . B12 deficiency 05/12/2015  . Allergic rhinitis 05/12/2015  . Breast cancer ([place]) 05/12/2015  . Colon cancer ([place]) 05/12/2015  . Closed head injury 12/15/2014    Past Surgical History:  Procedure Laterality Date  . ABDOMINAL HYSTERECTOMY    . APPENDECTOMY    . BREAST SURGERY Right    lumpectomy x2 with lymph nodes  . CARPAL TUNNEL RELEASE Left 06/04/2016   Procedure: CARPAL TUNNEL RELEASE;  Surgeon: Hessie Knows, MD;  Location: [facility] ORS;  Service: Orthopedics;  Laterality: Left;  . colectomy     partial  . IMAGE GUIDED SINUS SURGERY    . OPEN REDUCTION INTERNAL FIXATION (ORIF) DISTAL RADIAL FRACTURE Left 06/04/2016   Procedure: OPEN REDUCTION INTERNAL FIXATION (ORIF) DISTAL RADIAL FRACTURE;  Surgeon: Hessie Knows, MD;  Location: [facility] ORS;  Service: Orthopedics;  Laterality: Left;    Prior to Admission medications   Medication Sig Start Date End Date Taking? Authorizing Provider  ALPRAZolam Duanne Moron) 0.5 MG tablet Take 1 tablet by mouth 2 (two) times daily as needed for anxiety.  04/19/15  Yes Historical Provider, MD  aspirin EC 81 MG tablet Take 1 tablet (81 mg total) by mouth daily. 06/28/16  Yes Fritzi Mandes, MD  atenolol (TENORMIN) 50 MG tablet Take 1 tablet (50 mg total) by mouth daily. 05/18/15  Yes  Tama High III, MD  azelastine (ASTELIN) 0.1 % nasal spray Place 1 spray into both nostrils 2 (two) times daily. Use in each nostril as directed   Yes Historical Provider, MD  cholecalciferol (VITAMIN D) 1000 units tablet Take 1,000 Units by mouth daily.   Yes Historical Provider, MD  escitalopram (LEXAPRO) 10 MG tablet Take 1 tablet by mouth daily. 04/12/15  Yes Historical Provider, MD  HYDROcodone-acetaminophen (NORCO) 10-325 MG per tablet Take 0.5-1 tablets by mouth 3 (three) times daily as needed for moderate pain or severe pain.   04/28/15  Yes Historical Provider, MD  hydroxychloroquine (PLAQUENIL) 200 MG tablet Take 400 mg by mouth daily. Reported on 12/06/2015   Yes Historical Provider, MD  losartan (COZAAR) 50 MG tablet Take 1 tablet (50 mg total) by mouth daily. 05/18/15  Yes Tama High III, MD  montelukast (SINGULAIR) 10 MG tablet Take 10 mg by mouth at bedtime.   Yes Historical Provider, MD  predniSONE (DELTASONE) 10 MG tablet Label  & dispense according to the schedule below. 5 Pills PO for 1 day then, 4 Pills PO for 1 day, 3 Pills PO for 1 day, 2 Pills PO for 1 day, 1 Pill PO for 1 days then STOP. 07/20/16  Yes Henreitta Leber, MD  PROAIR HFA 108 (90 BASE) MCG/ACT inhaler Take 2 puffs by mouth every 6 (six) hours as needed for wheezing.  04/12/15  Yes Historical Provider, MD  SPIRIVA HANDIHALER 18 MCG inhalation capsule Take 1 Inhaler by mouth daily. 04/24/15  Yes Historical Provider, MD  doxycycline (VIBRAMYCIN) 100 MG capsule Take 1 capsule (100 mg) by mouth twice daily for 10 days. 09/04/16   Hinda Kehr, MD    Allergies Coreg [carvedilol]  Family History  Problem Relation Age of Onset  . Heart attack Mother   . Breast cancer Other   . Breast cancer Sister 44  . Breast cancer Sister 6    Social History Social History  Substance Use Topics  . Smoking status: Current Some Day Smoker    Packs/day: 0.25    Types: Cigarettes  . Smokeless tobacco: Never Used  . Alcohol use No    Review of Systems Constitutional: No fever/chills.  20-lb weight loss over last month.  Severe fatigue. Eyes: No visual changes. ENT: No sore throat. Cardiovascular: Right sided rib pain, worse with palpation Respiratory: Denies shortness of breath greater than baseline Gastrointestinal: No abdominal pain.  No nausea, no vomiting.  No diarrhea.  No constipation. Genitourinary: Negative for dysuria. Musculoskeletal: Negative for back pain. Skin: Negative for rash. Neurological: Negative for headaches, focal weakness or  numbness.  Generalized fatigue and weakness without focal neuro deficits.  10-point ROS otherwise negative.  ____________________________________________   PHYSICAL EXAM:  VITAL SIGNS: ED Triage Vitals  Enc Vitals Group     BP 09/04/16 1058 (!) 125/57     Pulse Rate 09/04/16 1058 70     Resp --      Temp 09/04/16 1058 97.6 F (36.4 C)     Temp Source 09/04/16 1058 Oral     SpO2 09/04/16 1058 94 %     Weight 09/04/16 1059 115 lb (52.2 kg)     Height 09/04/16 1059 '5\' 4"'$  (1.626 m)     Head Circumference --      Peak Flow --      Pain Score 09/04/16 1100 5     Pain Loc --      Pain Edu? --  Excl. in [place]? --     Constitutional: Alert and oriented. Well appearing and in no acute distress. Eyes: Conjunctivae are normal. PERRL. EOMI. Head: Atraumatic. Nose: No congestion/rhinnorhea. Mouth/Throat: Mucous membranes are moist.  Oropharynx non-erythematous. Neck: No stridor.  No meningeal signs.   Cardiovascular: Normal rate, regular rhythm. Good peripheral circulation. Grossly normal heart sounds. Respiratory: Normal respiratory effort.  No retractions.  Mild expiratory wheezing.  Tenderness to palpation of her right lateral ribs that is very reproducible and point tender Gastrointestinal: Soft and nontender. No distention.  Musculoskeletal: No lower extremity tenderness nor edema. No gross deformities of extremities. Neurologic:  Normal speech and language. No gross focal neurologic deficits are appreciated.  Skin:  Skin is warm, dry and intact. No rash noted. Psychiatric: Mood and affect are normal. Speech and behavior are normal.  ____________________________________________   LABS (all labs ordered are listed, but only abnormal results are displayed)  Labs Reviewed  CBC WITH DIFFERENTIAL/PLATELET - Abnormal; Notable for the following:       Result Value   RDW 14.6 (*)    Neutro Abs 9.2 (*)    Lymphs Abs 0.8 (*)    All other components within normal limits    COMPREHENSIVE METABOLIC PANEL - Abnormal; Notable for the following:    Chloride 98 (*)    CO2 33 (*)    ALT 10 (*)    All other components within normal limits  TROPONIN I   ____________________________________________  EKG  ED ECG REPORT I, Lonnie Rosado, the attending physician, personally viewed and interpreted this ECG.  Date: 09/04/2016 EKG Time: 10:56 AM Rate: 69 Rhythm: normal sinus rhythm QRS Axis: normal Intervals: normal, probable LVH ST/T Wave abnormalities: normal Conduction Disturbances: none Narrative Interpretation: unremarkable  ____________________________________________  RADIOLOGY   Dg Chest 2 View  Result Date: 09/04/2016 CLINICAL DATA:  Worsening shortness of breath. Weakness. Right-sided chest pain. EXAM: CHEST  2 VIEW COMPARISON:  07/19/2016 FINDINGS: There is biapical fibrosis, right worse than left. There is upward retraction of bilateral hila. There is no focal consolidation, pleural effusion or pneumothorax. There is a 18 mm left upper lobe pulmonary nodule. The heart size is normal. IMPRESSION: 1. 18 mm left upper lobe pulmonary nodule new compared with 07/19/2016. This may reflect an area of round pneumonia versus a pulmonary nodule. Followup PA and lateral chest X-ray is recommended in 3-4 weeks following trial of antibiotic therapy to ensure resolution and exclude underlying malignancy. Electronically Signed   By: Kathreen Devoid   On: 09/04/2016 11:39    ____________________________________________   PROCEDURES  Procedure(s) performed:   Procedures   Critical Care performed: No ____________________________________________   INITIAL IMPRESSION / ASSESSMENT AND PLAN / ED COURSE  Pertinent labs & imaging results that were available during my care of the patient were reviewed by me and considered in my medical decision making (see chart for details).  The patient has what the radiologist is describing as either a round pneumonia or  possibly a mass.  The recommendation from the radiologist is to treat for pneumonia and repeat the imaging in 3-4 weeks to see if it has resolved.  I discussed all this patient.  I am also calling Dr. Rogue Bussing (heme/onc) to discuss with him given her otherwise unremarkable labs and the recent report of weight loss to see if he feels that closer clinic follow-up with him would be helpful or if we should treat empirically for pneumonia (which could also be a reasonable explanation of her symptoms) and  have her follow-up as an outpatient with her primary care.  Clinical Course  Comment By Time  I missed a return phone call from Dr. Rogue Bussing and I am waiting to hear back from him again.  I am giving the patient a dose of doxycycline to begin treatment for community-acquired pneumonia. Hinda Kehr, MD 10/18 1345  (Delayed documentation)  I spoke with Dr. Rogue Bussing and he felt that following up with her primary care doctor would be appropriate.  However he was also going to [place] her name and medical record number to his lung specialist to make sure she gets the appropriate outpatient follow-up.  I went over this plan with the patient who understands and agrees and will follow up as recommended.  I chose doxycycline for her for outpatient community-acquired pneumonia treatment which I thought would have better and broader coverage and then a short course of azithromycin. She received a first dose in the ED. Hinda Kehr, MD 10/18 1854    ____________________________________________  FINAL CLINICAL IMPRESSION(S) / ED DIAGNOSES  Final diagnoses:  Community acquired pneumonia of left upper lobe of lung ([place])  Lung nodule     MEDICATIONS GIVEN DURING THIS VISIT:  Medications  doxycycline (VIBRA-TABS) tablet 100 mg (100 mg Oral Given 09/04/16 1351)     NEW OUTPATIENT MEDICATIONS STARTED DURING THIS VISIT:  Discharge Medication List as of 09/04/2016  1:51 PM    START taking these medications    Details  doxycycline (VIBRAMYCIN) 100 MG capsule Take 1 capsule (100 mg) by mouth twice daily for 10 days., Print        Discharge Medication List as of 09/04/2016  1:51 PM      Discharge Medication List as of 09/04/2016  1:51 PM       Note:  This document was prepared using Dragon voice recognition software and may include unintentional dictation errors.    Hinda Kehr, MD 09/04/16 [phone]

## 2016-09-04 NOTE — ED Notes (Signed)
MD at bedside. 

## 2016-09-04 NOTE — Discharge Instructions (Signed)
As we discussed, your workup was reassuring today except for an area on your lung that is most likely pneumonia but may represent a lung nodule.  The radiologist as well as the oncologist with whom I spoke recommended that you complete a course of antibiotics for pneumonia and follow-up with your primary care doctor.  He should order for you an outpatient two-view chest x-ray in 3-4 weeks to make sure that the area of probable infection has resolved.  If not, you may benefit from referral to an oncologist for further evaluation of the lung nodule.  Additionally, the oncologist is going to pass along your name and information to his lung specialist to make sure you do not need additional follow-up.  Return to the emergency department with any new or worsening symptoms that concern you.

## 2016-09-07 ENCOUNTER — Emergency Department: Payer: PPO

## 2016-09-07 ENCOUNTER — Emergency Department
Admission: EM | Admit: 2016-09-07 | Discharge: 2016-09-08 | Disposition: A | Discharge status: Home / Self Care | Payer: PPO | Attending: Emergency Medicine | Admitting: Emergency Medicine

## 2016-09-07 ENCOUNTER — Emergency Department
Admission: EM | Admit: 2016-09-07 | Discharge: 2016-09-07 | Disposition: A | Discharge status: Home / Self Care | Payer: PPO | Source: Home / Self Care | Attending: Emergency Medicine | Admitting: Emergency Medicine

## 2016-09-07 ENCOUNTER — Encounter: Payer: Self-pay | Admitting: Emergency Medicine

## 2016-09-07 DIAGNOSIS — R9431 Abnormal electrocardiogram [ECG] [EKG]: Secondary | ICD-10-CM

## 2016-09-07 DIAGNOSIS — Z853 Personal history of malignant neoplasm of breast: Secondary | ICD-10-CM

## 2016-09-07 DIAGNOSIS — F17210 Nicotine dependence, cigarettes, uncomplicated: Secondary | ICD-10-CM

## 2016-09-07 DIAGNOSIS — Z85038 Personal history of other malignant neoplasm of large intestine: Secondary | ICD-10-CM | POA: Insufficient documentation

## 2016-09-07 DIAGNOSIS — R112 Nausea with vomiting, unspecified: Secondary | ICD-10-CM | POA: Diagnosis not present

## 2016-09-07 DIAGNOSIS — G459 Transient cerebral ischemic attack, unspecified: Secondary | ICD-10-CM | POA: Insufficient documentation

## 2016-09-07 DIAGNOSIS — I509 Heart failure, unspecified: Secondary | ICD-10-CM | POA: Insufficient documentation

## 2016-09-07 DIAGNOSIS — I4581 Long QT syndrome: Secondary | ICD-10-CM

## 2016-09-07 DIAGNOSIS — I110 Hypertensive heart disease with heart failure: Secondary | ICD-10-CM | POA: Insufficient documentation

## 2016-09-07 DIAGNOSIS — R197 Diarrhea, unspecified: Secondary | ICD-10-CM

## 2016-09-07 DIAGNOSIS — Z79899 Other long term (current) drug therapy: Secondary | ICD-10-CM | POA: Insufficient documentation

## 2016-09-07 DIAGNOSIS — J449 Chronic obstructive pulmonary disease, unspecified: Secondary | ICD-10-CM

## 2016-09-07 DIAGNOSIS — R42 Dizziness and giddiness: Secondary | ICD-10-CM | POA: Insufficient documentation

## 2016-09-07 DIAGNOSIS — J45909 Unspecified asthma, uncomplicated: Secondary | ICD-10-CM | POA: Insufficient documentation

## 2016-09-07 DIAGNOSIS — Z7982 Long term (current) use of aspirin: Secondary | ICD-10-CM | POA: Insufficient documentation

## 2016-09-07 DIAGNOSIS — E860 Dehydration: Secondary | ICD-10-CM | POA: Insufficient documentation

## 2016-09-07 DIAGNOSIS — R911 Solitary pulmonary nodule: Secondary | ICD-10-CM | POA: Diagnosis not present

## 2016-09-07 LAB — COMPREHENSIVE METABOLIC PANEL
ALBUMIN: 3.6 g/dL (ref 3.5–5.0)
ALT: 12 U/L — AB (ref 14–54)
ALT: 12 U/L — ABNORMAL LOW (ref 14–54)
AST: 24 U/L (ref 15–41)
AST: 21 U/L (ref 15–41)
Albumin: 3.4 g/dL — ABNORMAL LOW (ref 3.5–5.0)
Alkaline Phosphatase: 51 U/L (ref 38–126)
Alkaline Phosphatase: 46 U/L (ref 38–126)
Anion gap: 10 (ref 5–15)
Anion gap: 7 (ref 5–15)
BUN: 9 mg/dL (ref 6–20)
BUN: 7 mg/dL (ref 6–20)
CHLORIDE: 90 mmol/L — AB (ref 101–111)
CHLORIDE: 96 mmol/L — AB (ref 101–111)
CO2: 27 mmol/L (ref 22–32)
CO2: 31 mmol/L (ref 22–32)
CREATININE: 0.64 mg/dL (ref 0.44–1.00)
Calcium: 9 mg/dL (ref 8.9–10.3)
Calcium: 9 mg/dL (ref 8.9–10.3)
Creatinine, Ser: 0.57 mg/dL (ref 0.44–1.00)
GFR calc Af Amer: >60 mL/min (ref >60)
GFR calc non Af Amer: >60 mL/min (ref >60)
GLUCOSE: 150 mg/dL — AB (ref 65–99)
Glucose, Bld: 98 mg/dL (ref 65–99)
POTASSIUM: 3.5 mmol/L (ref 3.5–5.1)
POTASSIUM: 4.1 mmol/L (ref 3.5–5.1)
SODIUM: 134 mmol/L — AB (ref 135–145)
Sodium: 127 mmol/L — ABNORMAL LOW (ref 135–145)
Total Bilirubin: 0.5 mg/dL (ref 0.3–1.2)
Total Bilirubin: 0.5 mg/dL (ref 0.3–1.2)
Total Protein: 6.8 g/dL (ref 6.5–8.1)
Total Protein: 6.4 g/dL — ABNORMAL LOW (ref 6.5–8.1)

## 2016-09-07 LAB — URINALYSIS COMPLETE WITH MICROSCOPIC (ARMC ONLY)
Bilirubin Urine: NEGATIVE
Glucose, UA: NEGATIVE mg/dL
Hgb urine dipstick: NEGATIVE
KETONES UR: NEGATIVE mg/dL
Leukocytes, UA: NEGATIVE
Nitrite: NEGATIVE
PH: 7 (ref 5.0–8.0)
PROTEIN: NEGATIVE mg/dL
Specific Gravity, Urine: 1.006 (ref 1.005–1.030)

## 2016-09-07 LAB — CBC
HEMATOCRIT: 39.1 % (ref 35.0–47.0)
Hemoglobin: 13.3 g/dL (ref 12.0–16.0)
MCH: 28 pg (ref 26.0–34.0)
MCHC: 34 g/dL (ref 32.0–36.0)
MCV: 82.4 fL (ref 80.0–100.0)
PLATELETS: 398 10*3/uL (ref 150–440)
RBC: 4.75 MIL/uL (ref 3.80–5.20)
RDW: 13.8 % (ref 11.5–14.5)
WBC: 10 10*3/uL (ref 3.6–11.0)

## 2016-09-07 LAB — LIPASE, BLOOD: Lipase: 23 U/L (ref 11–51)

## 2016-09-07 LAB — TROPONIN I: Troponin I: <0.03 ng/mL (ref <0.03)

## 2016-09-07 MED ORDER — MECLIZINE HCL 25 MG PO TABS
25.0000 mg | ORAL_TABLET | Freq: Once | ORAL | Status: AC
  Administered 2016-09-07: 25 mg via ORAL
  Filled 2016-09-07: qty 1

## 2016-09-07 MED ORDER — ACETAMINOPHEN 325 MG PO TABS
650.0000 mg | ORAL_TABLET | Freq: Once | ORAL | Status: AC
Start: 2016-09-07 — End: 2016-09-07
  Administered 2016-09-07: 650 mg via ORAL
  Filled 2016-09-07: qty 2

## 2016-09-07 MED ORDER — SODIUM CHLORIDE 0.9 % IV BOLUS (SEPSIS)
1000.0000 mL | Freq: Once | INTRAVENOUS | Status: AC
Start: 2016-09-07 — End: 2016-09-07
  Administered 2016-09-07: 1000 mL via INTRAVENOUS

## 2016-09-07 MED ORDER — METOCLOPRAMIDE HCL 5 MG/ML IJ SOLN
10.0000 mg | Freq: Once | INTRAMUSCULAR | Status: AC
  Administered 2016-09-07: 10 mg via INTRAVENOUS
  Filled 2016-09-07: qty 2

## 2016-09-07 MED ORDER — LORAZEPAM 2 MG/ML IJ SOLN
0.5000 mg | Freq: Once | INTRAMUSCULAR | Status: AC
  Administered 2016-09-07: 0.5 mg via INTRAVENOUS

## 2016-09-07 MED ORDER — LORAZEPAM 2 MG/ML IJ SOLN
INTRAMUSCULAR | Status: AC
  Administered 2016-09-07: 0.5 mg via INTRAVENOUS
  Filled 2016-09-07: qty 1

## 2016-09-07 MED ORDER — ALPRAZOLAM 0.5 MG PO TABS: 0.5000 mg | ORAL_TABLET | Freq: Once | ORAL | Status: DC

## 2016-09-07 MED ORDER — LORAZEPAM 0.5 MG PO TABS
0.5000 mg | ORAL_TABLET | Freq: Once | ORAL | Status: AC
  Administered 2016-09-07: 0.5 mg via ORAL
  Filled 2016-09-07: qty 1

## 2016-09-07 NOTE — ED Notes (Signed)
Mri tech [phone]. Spoke with pt - will be here in approx 1 hr.

## 2016-09-07 NOTE — ED Triage Notes (Addendum)
Pt to ED from home. Was seen here on Wednesday and dx with pneumonia. States she was given doxycycline and has been taking it as ordered. Today she reports feeling increased weakness and nausea and vomiting. Pt had one episode of vomiting in triage.

## 2016-09-07 NOTE — ED Provider Notes (Addendum)
[facility] Emergency Department Provider Note  ____________________________________________   I have reviewed the triage vital signs and the nursing notes.   HISTORY  Chief Complaint Nausea and Weakness    HPI Robin Hudson is a 75 y.o. female with a history of anxiety, asthma, lung cancer, COPD, home oxygen, chronic scarring of the lungs, presents today with nausea vomiting and diarrhea for 2-3 days. She states she feels somewhat weak. She feels lightheaded. She denies any focal neurologic deficit. She has had no chest pain or shortness of breath. She denies abdominal pain. She states that she is not having she vomited only once or twice today. She denies any numbness or weakness focally no fever. He states family was had the same disease processes she developed nausea vomiting and diarrhea. She denies any melena or bright red blood per rectum.  Past Medical History:  Diagnosis Date  . Allergic rhinitis   . Anxiety   . Asthma   . B12 deficiency   . Breast cancer ([place]) 1999   RT LUMPECTOMY  . Carcinoma of right breast treated with adjuvant chemotherapy ([place]) 1999   RT LUMPECTOMY  . CHF (congestive heart failure) ([place])   . Colon cancer ([place]) 2010  . Colon cancer ([place])   . COPD (chronic obstructive pulmonary disease) ([place])   . Hyperlipemia   . Hypertension   . MRSA pneumonia ([place])   . Osteoporosis   . Radiation 1999   BREAST CA  . Right adrenal mass [facility])     Patient Active Problem List   Diagnosis Date Noted  . Hypoxia 12/06/2015  . COPD exacerbation ([place]) 12/03/2015  . Elevated troponin 12/03/2015  . Elevated antinuclear antibody (ANA) level 05/15/2015  . Hyperlipemia 05/12/2015  . COPD (chronic obstructive pulmonary disease) ([place]) 05/12/2015  . HTN (hypertension) 05/12/2015  . Osteoporosis 05/12/2015  . Right adrenal mass ([place]) 05/12/2015  . B12 deficiency 05/12/2015  . Allergic rhinitis 05/12/2015  . Breast cancer ([place]) 05/12/2015  .  Colon cancer ([place]) 05/12/2015  . Closed head injury 12/15/2014    Past Surgical History:  Procedure Laterality Date  . ABDOMINAL HYSTERECTOMY    . APPENDECTOMY    . BREAST SURGERY Right    lumpectomy x2 with lymph nodes  . CARPAL TUNNEL RELEASE Left 06/04/2016   Procedure: CARPAL TUNNEL RELEASE;  Surgeon: Hessie Knows, MD;  Location: [facility] ORS;  Service: Orthopedics;  Laterality: Left;  . colectomy     partial  . IMAGE GUIDED SINUS SURGERY    . OPEN REDUCTION INTERNAL FIXATION (ORIF) DISTAL RADIAL FRACTURE Left 06/04/2016   Procedure: OPEN REDUCTION INTERNAL FIXATION (ORIF) DISTAL RADIAL FRACTURE;  Surgeon: Hessie Knows, MD;  Location: [facility] ORS;  Service: Orthopedics;  Laterality: Left;    Prior to Admission medications   Medication Sig Start Date End Date Taking? Authorizing Provider  ALPRAZolam Duanne Moron) 0.5 MG tablet Take 1 tablet by mouth 2 (two) times daily as needed for anxiety.  04/19/15   Historical Provider, MD  aspirin EC 81 MG tablet Take 1 tablet (81 mg total) by mouth daily. 06/28/16   Fritzi Mandes, MD  atenolol (TENORMIN) 50 MG tablet Take 1 tablet (50 mg total) by mouth daily. 05/18/15   Tama High III, MD  azelastine (ASTELIN) 0.1 % nasal spray Place 1 spray into both nostrils 2 (two) times daily. Use in each nostril as directed    Historical Provider, MD  cholecalciferol (VITAMIN D) 1000 units tablet Take 1,000 Units by mouth daily.  Historical Provider, MD  doxycycline (VIBRAMYCIN) 100 MG capsule Take 1 capsule (100 mg) by mouth twice daily for 10 days. 09/04/16   Hinda Kehr, MD  escitalopram (LEXAPRO) 10 MG tablet Take 1 tablet by mouth daily. 04/12/15   Historical Provider, MD  HYDROcodone-acetaminophen (NORCO) 10-325 MG per tablet Take 0.5-1 tablets by mouth 3 (three) times daily as needed for moderate pain or severe pain.  04/28/15   Historical Provider, MD  hydroxychloroquine (PLAQUENIL) 200 MG tablet Take 400 mg by mouth daily. Reported on 12/06/2015    Historical  Provider, MD  losartan (COZAAR) 50 MG tablet Take 1 tablet (50 mg total) by mouth daily. 05/18/15   Tama High III, MD  montelukast (SINGULAIR) 10 MG tablet Take 10 mg by mouth at bedtime.    Historical Provider, MD  predniSONE (DELTASONE) 10 MG tablet Label  & dispense according to the schedule below. 5 Pills PO for 1 day then, 4 Pills PO for 1 day, 3 Pills PO for 1 day, 2 Pills PO for 1 day, 1 Pill PO for 1 days then STOP. 07/20/16   Henreitta Leber, MD  PROAIR HFA 108 (90 BASE) MCG/ACT inhaler Take 2 puffs by mouth every 6 (six) hours as needed for wheezing.  04/12/15   Historical Provider, MD  SPIRIVA HANDIHALER 18 MCG inhalation capsule Take 1 Inhaler by mouth daily. 04/24/15   Historical Provider, MD    Allergies Coreg [carvedilol]  Family History  Problem Relation Age of Onset  . Heart attack Mother   . Breast cancer Other   . Breast cancer Sister 66  . Breast cancer Sister 32    Social History Social History  Substance Use Topics  . Smoking status: Current Some Day Smoker    Packs/day: 0.25    Types: Cigarettes  . Smokeless tobacco: Never Used  . Alcohol use No    Review of Systems Constitutional: No fever/chills Eyes: No visual changes. ENT: No sore throat. No stiff neck no neck pain Cardiovascular: Denies chest pain. Respiratory: Denies shortness of breath. Gastrointestinal:   no vomiting.  No diarrhea.  No constipation. Genitourinary: Negative for dysuria. Musculoskeletal: Negative lower extremity swelling Skin: Negative for rash. Neurological: Negative for severe headaches, focal weakness or numbness. 10-point ROS otherwise negative.  ____________________________________________   PHYSICAL EXAM:  VITAL SIGNS: ED Triage Vitals  Enc Vitals Group     BP 09/07/16 1157 (!) 174/88     Pulse Rate 09/07/16 1157 75     Resp 09/07/16 1157 16     Temp 09/07/16 1157 98.2 F (36.8 C)     Temp Source 09/07/16 1157 Oral     SpO2 09/07/16 1157 99 %     Weight 09/07/16  1158 115 lb (52.2 kg)     Height 09/07/16 1158 '5\' 4"'$  (1.626 m)     Head Circumference --      Peak Flow --      Pain Score 09/07/16 1220 7     Pain Loc --      Pain Edu? --      Excl. in [place]? --     Constitutional: Alert and oriented. Well appearing and in no acute distress. Eyes: Conjunctivae are normal. PERRL. EOMI. Head: Atraumatic. Nose: No congestion/rhinnorhea. Mouth/Throat: Mucous membranes are Dry.  Oropharynx non-erythematous. Neck: No stridor.   Nontender with no meningismus Cardiovascular: Normal rate, regular rhythm. Grossly normal heart sounds.  Good peripheral circulation. Respiratory: Normal respiratory effort.  No retractions. Lungs CTAB. Abdominal: Soft  and nontender. No distention. No guarding no rebound Back:  There is no focal tenderness or step off.  there is no midline tenderness there are no lesions noted. there is no CVA tenderness Musculoskeletal: No lower extremity tenderness, no upper extremity tenderness. No joint effusions, no DVT signs strong distal pulses no edema Neurologic:  Normal speech and language. No gross focal neurologic deficits are appreciated.  Skin:  Skin is warm, dry and intact. No rash noted. Psychiatric: Mood and affect are normal. Speech and behavior are normal.  ____________________________________________   LABS (all labs ordered are listed, but only abnormal results are displayed)  Labs Reviewed  COMPREHENSIVE METABOLIC PANEL - Abnormal; Notable for the following:       Result Value   Sodium 127 (*)    Chloride 90 (*)    Glucose, Bld 150 (*)    ALT 12 (*)    All other components within normal limits  URINALYSIS COMPLETEWITH MICROSCOPIC ([facility] ONLY) - Abnormal; Notable for the following:    Color, Urine STRAW (*)    APPearance CLEAR (*)    Bacteria, UA RARE (*)    Squamous Epithelial / LPF 0-5 (*)    All other components within normal limits  CBC  LIPASE, BLOOD  TROPONIN I    ____________________________________________  EKG  I personally interpreted any EKGs ordered by me or triage Normal sinus rhythm at 69 bpm no acute ST elevation or depression. QTC somewhat long. ____________________________________________  RADIOLOGY  I reviewed any imaging ordered by me or triage that were performed during my shift and, if possible, patient and/or family made aware of any abnormal findings. ____________________________________________   PROCEDURES  Procedure(s) performed: None  Procedures  Critical Care performed: None  ____________________________________________   INITIAL IMPRESSION / ASSESSMENT AND PLAN / ED COURSE  Pertinent labs & imaging results that were available during my care of the patient were reviewed by me and considered in my medical decision making (see chart for details).  Patient was given IV fluid and she feels 100% better she is requesting discharge. CT of the head done because she felt "swimmy headed" is negative, no evidence of metastatic disease per a patient his chronic lung findings which she is following up as an outpatient for she states, there is no change in that no evidence of pneumonia. Abdomen on serial exams are benign. Blood work is noted. She had a slightly decreased sodium is slightly decreased chloride. We have given her a liter of sodium chloride I think this will help. The patient at this time has no symptoms, she is tolerating by mouth, she is requesting discharge. I did offer her admission for observation given her age and her feeling of generalized weakness prior to coming in but she refuses. She states she would like to go home. Vital signs are reassuring. She can tolerate by mouth we'll discharge her. 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 I am avoiding any QT prolongation medications here  ----------------------------------------- 5:08 PM on  09/07/2016 -----------------------------------------  Pt tol po husband here expressing anger that she has not been discharged yet. She has no complaints. Return precautions and f//u given and understood. abd benign.  Clinical Course   ____________________________________________   FINAL CLINICAL IMPRESSION(S) / ED DIAGNOSES  Final diagnoses:  None      This chart was dictated using voice recognition software.  Despite best efforts to proofread,  errors can occur which can change meaning.  Schuyler Amor, MD 09/07/16 [place], MD 09/07/16 [phone]

## 2016-09-07 NOTE — ED Notes (Addendum)
Pt stating n/v with standing but when she is laying down she is not vomiting. Pt is stating that she is dizzy with "being up," that started yesterday. Pt stating that she started with this 3 days ago. Pt denying abdominal pain. Pt stating she had diarrhea a couple of days ago. Pt denying any pain or irritation with urination. Pt is on the cardiac monitor and is on O2. Pt does wear oxygen at home.

## 2016-09-07 NOTE — ED Notes (Signed)
Pt assisted to restroom. Pt ambulating well

## 2016-09-07 NOTE — ED Provider Notes (Signed)
----------------------------------------- 11:49 PM on 09/07/2016 -----------------------------------------   Blood pressure (!) 159/77, pulse 76, temperature 98.3 F (36.8 C), resp. rate (!) 23, height '5\' 4"'$  (1.626 m), weight 52.2 kg, SpO2 97 %.  Assuming care from Dr. Quentin Cornwall.  In short, Robin Hudson is a 75 y.o. female with a chief complaint of Dizziness; Emesis; and Nausea .  Refer to the original H&P for additional details.  The current plan of care is to follow up MRI and reassess.    ----------------------------------------- 1:18 AM on 09/08/2016 -----------------------------------------  The patient is sleeping comfortably.  She states that her dizziness/vertigo has improved substantially after the meclizine.  Her Robin imaging was unremarkable.  I encouraged her to follow up with her primary care doctor at the next available opportunity.  She has no concerns and is ready to go home.    Robin Hudson  Result Date: 09/08/2016 CLINICAL DATA:  Nausea, vomiting and diarrhea for 3 days, weakness. Lightheadedness and lightheadedness without focal neurologic deficit. Assess for posterior circulation stroke. History of hypertension, hyperlipidemia, breast and colon cancer. EXAM: MRI HEAD WITHOUT Hudson MRA HEAD WITHOUT Hudson TECHNIQUE: Multiplanar, multiecho pulse sequences of the brain and surrounding structures were obtained without intravenous Hudson. Angiographic images of the head were obtained using MRA technique without Hudson. COMPARISON:  CT HEAD September 07, 2016 FINDINGS: MRI HEAD FINDINGS BRAIN: No reduced diffusion to suggest acute ischemia. No susceptibility artifact to suggest hemorrhage. Small area RIGHT occipital lobe encephalomalacia. The ventricles and sulci normal for patient's age. Patchy to confluent supratentorial and pontine white matter T2 hyperintensities. No midline shift, mass effect or masses. Old LEFT basal ganglia lacunar infarcts.  Bilateral inferior basal ganglia perivascular spaces. VASCULAR: Normal major intracranial vascular flow voids present at skull base. SKULL AND UPPER CERVICAL SPINE: No abnormal sellar expansion. No suspicious calvarial bone marrow signal. Craniocervical junction maintained. SINUSES/ORBITS: The mastoid air-cells and included paranasal sinuses are well-aerated. The included ocular globes and orbital contents are non-suspicious. OTHER: Patient is edentulous. MRA HEAD FINDINGS- Mild motion degraded examination. ANTERIOR CIRCULATION: Normal flow related enhancement of the included cervical, petrous, cavernous and supraclinoid internal carotid arteries. Mild luminal irregularity of the carotid siphons attributed to calcific atherosclerosis seen on yesterday's CT HEAD. Patent anterior communicating artery. Widely fenestrated RIGHT A1 segment. Normal flow related enhancement of the anterior and middle cerebral arteries, including distal segments. No large vessel occlusion, high-grade stenosis, suspicious luminal irregularity, aneurysm. POSTERIOR CIRCULATION: LEFT vertebral artery is dominant. Basilar artery is patent, with normal flow related enhancement of the main branch vessels. Normal flow related enhancement of the posterior cerebral arteries. Robust LEFT posterior cerebral artery. Tortuous LEFT posterior cerebral artery with probable slow flow. No large vessel occlusion, high-grade stenosis, abnormal luminal irregularity, aneurysm. IMPRESSION: MRI HEAD: No acute intracranial process. 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 Small old RIGHT occipital lobe infarct versus contusion. MRA HEAD: Mild motion degraded examination without emergent large vessel occlusion or high-grade stenosis. Electronically Signed   By: Elon Alas M.D.   On: 09/08/2016 01:03   Robin Hudson  Result Date: 09/08/2016 CLINICAL DATA:  Nausea, vomiting and diarrhea  for 3 days, weakness. Lightheadedness and lightheadedness without focal neurologic deficit. Assess for posterior circulation stroke. History of hypertension, hyperlipidemia, breast and colon cancer. EXAM: MRI HEAD WITHOUT Hudson MRA HEAD WITHOUT Hudson TECHNIQUE: Multiplanar, multiecho pulse sequences of the brain and surrounding structures were obtained without intravenous Hudson. Angiographic images of  the head were obtained using MRA technique without Hudson. COMPARISON:  CT HEAD September 07, 2016 FINDINGS: MRI HEAD FINDINGS BRAIN: No reduced diffusion to suggest acute ischemia. No susceptibility artifact to suggest hemorrhage. Small area RIGHT occipital lobe encephalomalacia. The ventricles and sulci normal for patient's age. Patchy to confluent supratentorial and pontine white matter T2 hyperintensities. No midline shift, mass effect or masses. Old LEFT basal ganglia lacunar infarcts. Bilateral inferior basal ganglia perivascular spaces. VASCULAR: Normal major intracranial vascular flow voids present at skull base. SKULL AND UPPER CERVICAL SPINE: No abnormal sellar expansion. No suspicious calvarial bone marrow signal. Craniocervical junction maintained. SINUSES/ORBITS: The mastoid air-cells and included paranasal sinuses are well-aerated. The included ocular globes and orbital contents are non-suspicious. OTHER: Patient is edentulous. MRA HEAD FINDINGS- Mild motion degraded examination. ANTERIOR CIRCULATION: Normal flow related enhancement of the included cervical, petrous, cavernous and supraclinoid internal carotid arteries. Mild luminal irregularity of the carotid siphons attributed to calcific atherosclerosis seen on yesterday's CT HEAD. Patent anterior communicating artery. Widely fenestrated RIGHT A1 segment. Normal flow related enhancement of the anterior and middle cerebral arteries, including distal segments. No large vessel occlusion, high-grade stenosis, suspicious luminal irregularity,  aneurysm. POSTERIOR CIRCULATION: LEFT vertebral artery is dominant. Basilar artery is patent, with normal flow related enhancement of the main branch vessels. Normal flow related enhancement of the posterior cerebral arteries. Robust LEFT posterior cerebral artery. Tortuous LEFT posterior cerebral artery with probable slow flow. No large vessel occlusion, high-grade stenosis, abnormal luminal irregularity, aneurysm. IMPRESSION: MRI HEAD: No acute intracranial process. 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 Small old RIGHT occipital lobe infarct versus contusion. MRA HEAD: Mild motion degraded examination without emergent large vessel occlusion or high-grade stenosis. Electronically Signed   By: Elon Alas M.D.   On: 09/08/2016 01:03      Hinda Kehr, MD 09/08/16 [phone]

## 2016-09-07 NOTE — ED Triage Notes (Signed)
Patient states that she has had dizziness, nausea and vomiting times three days. Patient states that she was seen here early today and diagnosed with vertigo and given medications. Patient states that she was feeling better at discharge but here symptoms have now returned.

## 2016-09-07 NOTE — ED Notes (Signed)
Pt tolerated food well. Pt stating that her nausea is much better when she stands. Pt's husband is wanting her d/c right now. Nurse told pt's husband that it would be a few more minutes for the doctor to write up pt's d/c instructions. Pt's husband stated, "this is bullshit." Nurse let Dr. Burlene Arnt know that pt tolerated food good and was wanting to go home and that pt's husband was anxious to leave.

## 2016-09-07 NOTE — ED Notes (Signed)
Pt has a prolonged QT and can not have Zofran.

## 2016-09-07 NOTE — ED Notes (Signed)
Pt attempting to urinate.

## 2016-09-07 NOTE — ED Provider Notes (Signed)
[facility] Emergency Department Provider Note    First MD Initiated Contact with Patient 09/07/16 2148     (approximate)  I have reviewed the triage vital signs and the nursing notes.   HISTORY  Chief Complaint Dizziness; Emesis; and Nausea    HPI Robin Hudson is a 75 y.o. female  seen earlier today with complaint of 3 days of diarrhea nausea and vomiting with associated dizziness. Patient did have mild hyponatremia and dehydration which was improved with IV fluids. She had a CT of her head to evaluate for CVA which was unremarkable. Patient was recommended for admission but did not want admission at that time. She then went home and stated that as she got home she had worsening dizziness and inability to tolerate oral hydration. I time she presented to the ER here states that the dizziness is associated with movement. Does not have a previous diagnosis of vertigo. Denies any worsening shortness of breath, fever. No abdominal pain.   Past Medical History:  Diagnosis Date  . Allergic rhinitis   . Anxiety   . Asthma   . B12 deficiency   . Breast cancer ([place]) 1999   RT LUMPECTOMY  . Carcinoma of right breast treated with adjuvant chemotherapy ([place]) 1999   RT LUMPECTOMY  . CHF (congestive heart failure) ([place])   . Colon cancer ([place]) 2010  . Colon cancer ([place])   . COPD (chronic obstructive pulmonary disease) ([place])   . Hyperlipemia   . Hypertension   . MRSA pneumonia ([place])   . Osteoporosis   . Radiation 1999   BREAST CA  . Right adrenal mass [facility])     Patient Active Problem List   Diagnosis Date Noted  . Hypoxia 12/06/2015  . COPD exacerbation ([place]) 12/03/2015  . Elevated troponin 12/03/2015  . Elevated antinuclear antibody (ANA) level 05/15/2015  . Hyperlipemia 05/12/2015  . COPD (chronic obstructive pulmonary disease) ([place]) 05/12/2015  . HTN (hypertension) 05/12/2015  . Osteoporosis 05/12/2015  . Right adrenal mass ([place]) 05/12/2015  . B12  deficiency 05/12/2015  . Allergic rhinitis 05/12/2015  . Breast cancer ([place]) 05/12/2015  . Colon cancer ([place]) 05/12/2015  . Closed head injury 12/15/2014    Past Surgical History:  Procedure Laterality Date  . ABDOMINAL HYSTERECTOMY    . APPENDECTOMY    . BREAST SURGERY Right    lumpectomy x2 with lymph nodes  . CARPAL TUNNEL RELEASE Left 06/04/2016   Procedure: CARPAL TUNNEL RELEASE;  Surgeon: Hessie Knows, MD;  Location: [facility] ORS;  Service: Orthopedics;  Laterality: Left;  . colectomy     partial  . IMAGE GUIDED SINUS SURGERY    . OPEN REDUCTION INTERNAL FIXATION (ORIF) DISTAL RADIAL FRACTURE Left 06/04/2016   Procedure: OPEN REDUCTION INTERNAL FIXATION (ORIF) DISTAL RADIAL FRACTURE;  Surgeon: Hessie Knows, MD;  Location: [facility] ORS;  Service: Orthopedics;  Laterality: Left;    Prior to Admission medications   Medication Sig Start Date End Date Taking? Authorizing Provider  ALPRAZolam Duanne Moron) 0.5 MG tablet Take 1 tablet by mouth 2 (two) times daily as needed for anxiety.  04/19/15   Historical Provider, MD  aspirin EC 81 MG tablet Take 1 tablet (81 mg total) by mouth daily. 06/28/16   Fritzi Mandes, MD  atenolol (TENORMIN) 50 MG tablet Take 1 tablet (50 mg total) by mouth daily. 05/18/15   Tama High III, MD  azelastine (ASTELIN) 0.1 % nasal spray Place 1 spray into both nostrils 2 (two) times daily. Use  in each nostril as directed    Historical Provider, MD  cholecalciferol (VITAMIN D) 1000 units tablet Take 1,000 Units by mouth daily.    Historical Provider, MD  doxycycline (VIBRAMYCIN) 100 MG capsule Take 1 capsule (100 mg) by mouth twice daily for 10 days. 09/04/16   Hinda Kehr, MD  escitalopram (LEXAPRO) 10 MG tablet Take 1 tablet by mouth daily. 04/12/15   Historical Provider, MD  HYDROcodone-acetaminophen (NORCO) 10-325 MG per tablet Take 0.5-1 tablets by mouth 3 (three) times daily as needed for moderate pain or severe pain.  04/28/15   Historical Provider, MD  hydroxychloroquine  (PLAQUENIL) 200 MG tablet Take 400 mg by mouth daily. Reported on 12/06/2015    Historical Provider, MD  losartan (COZAAR) 50 MG tablet Take 1 tablet (50 mg total) by mouth daily. 05/18/15   Tama High III, MD  montelukast (SINGULAIR) 10 MG tablet Take 10 mg by mouth at bedtime.    Historical Provider, MD  predniSONE (DELTASONE) 10 MG tablet Label  & dispense according to the schedule below. 5 Pills PO for 1 day then, 4 Pills PO for 1 day, 3 Pills PO for 1 day, 2 Pills PO for 1 day, 1 Pill PO for 1 days then STOP. 07/20/16   Henreitta Leber, MD  PROAIR HFA 108 (90 BASE) MCG/ACT inhaler Take 2 puffs by mouth every 6 (six) hours as needed for wheezing.  04/12/15   Historical Provider, MD  SPIRIVA HANDIHALER 18 MCG inhalation capsule Take 1 Inhaler by mouth daily. 04/24/15   Historical Provider, MD    Allergies Coreg [carvedilol]  Family History  Problem Relation Age of Onset  . Heart attack Mother   . Breast cancer Other   . Breast cancer Sister 67  . Breast cancer Sister 29    Social History Social History  Substance Use Topics  . Smoking status: Current Some Day Smoker    Packs/day: 0.25    Types: Cigarettes  . Smokeless tobacco: Never Used  . Alcohol use No    Review of Systems Patient denies headaches, rhinorrhea, blurry vision, numbness, shortness of breath, chest pain, edema, cough, abdominal pain, nausea, vomiting, diarrhea, dysuria, fevers, rashes or hallucinations unless otherwise stated above in HPI. ____________________________________________   PHYSICAL EXAM:  VITAL SIGNS: Vitals:   09/07/16 2101  BP: (!) 150/81  Pulse: 86  Resp: (!) 22  Temp: 98.3 F (36.8 C)    Constitutional: Alert and oriented. Well appearing and in no acute distress. Eyes: Conjunctivae are normal. PERRL. EOMI. Head: Atraumatic. Nose: No congestion/rhinnorhea. Mouth/Throat: Mucous membranes are moist.  Oropharynx non-erythematous. Neck: No stridor. Painless ROM. No cervical spine tenderness  to palpation Hematological/Lymphatic/Immunilogical: No cervical lymphadenopathy. Cardiovascular: Normal rate, regular rhythm. Grossly normal heart sounds.  Good peripheral circulation. Respiratory: Normal respiratory effort.  No retractions. Lungs CTAB. Gastrointestinal: Soft and nontender. No distention. No abdominal bruits. No CVA tenderness. Musculoskeletal: No lower extremity tenderness nor edema.  No joint effusions. Neurologic:  Normal speech and language. No gross focal neurologic deficits are appreciated. No facial droop. Normal fnf, no nystagmus Skin:  Skin is warm, dry and intact. No rash noted. Psychiatric: Mood and affect are normal. Speech and behavior are normal.  ____________________________________________   LABS (all labs ordered are listed, but only abnormal results are displayed)  Results for orders placed or performed during the hospital encounter of 09/07/16 (from the past 24 hour(s))  Comprehensive metabolic panel     Status: Abnormal   Collection Time: 09/07/16 12:05  PM  Result Value Ref Range   Sodium 127 (L) 135 - 145 mmol/L   Potassium 3.5 3.5 - 5.1 mmol/L   Chloride 90 (L) 101 - 111 mmol/L   CO2 27 22 - 32 mmol/L   Glucose, Bld 150 (H) 65 - 99 mg/dL   BUN 9 6 - 20 mg/dL   Creatinine, Ser 0.64 0.44 - 1.00 mg/dL   Calcium 9.0 8.9 - 10.3 mg/dL   Total Protein 6.8 6.5 - 8.1 g/dL   Albumin 3.6 3.5 - 5.0 g/dL   AST 24 15 - 41 U/L   ALT 12 (L) 14 - 54 U/L   Alkaline Phosphatase 51 38 - 126 U/L   Total Bilirubin 0.5 0.3 - 1.2 mg/dL   GFR calc non Af Amer >60 >60 mL/min   GFR calc Af Amer >60 >60 mL/min   Anion gap 10 5 - 15  CBC     Status: None   Collection Time: 09/07/16 12:05 PM  Result Value Ref Range   WBC 10.0 3.6 - 11.0 K/uL   RBC 4.75 3.80 - 5.20 MIL/uL   Hemoglobin 13.3 12.0 - 16.0 g/dL   HCT 39.1 35.0 - 47.0 %   MCV 82.4 80.0 - 100.0 fL   MCH 28.0 26.0 - 34.0 pg   MCHC 34.0 32.0 - 36.0 g/dL   RDW 13.8 11.5 - 14.5 %   Platelets 398 150 - 440  K/uL  Lipase, blood     Status: None   Collection Time: 09/07/16 12:05 PM  Result Value Ref Range   Lipase 23 11 - 51 U/L  Troponin I     Status: None   Collection Time: 09/07/16 12:05 PM  Result Value Ref Range   Troponin I <0.03 <0.03 ng/mL  Urinalysis complete, with microscopic     Status: Abnormal   Collection Time: 09/07/16  3:33 PM  Result Value Ref Range   Color, Urine STRAW (A) YELLOW   APPearance CLEAR (A) CLEAR   Glucose, UA NEGATIVE NEGATIVE mg/dL   Bilirubin Urine NEGATIVE NEGATIVE   Ketones, ur NEGATIVE NEGATIVE mg/dL   Specific Gravity, Urine 1.006 1.005 - 1.030   Hgb urine dipstick NEGATIVE NEGATIVE   pH 7.0 5.0 - 8.0   Protein, ur NEGATIVE NEGATIVE mg/dL   Nitrite NEGATIVE NEGATIVE   Leukocytes, UA NEGATIVE NEGATIVE   RBC / HPF 0-5 0 - 5 RBC/hpf   WBC, UA 0-5 0 - 5 WBC/hpf   Bacteria, UA RARE (A) NONE SEEN   Squamous Epithelial / LPF 0-5 (A) NONE SEEN   Mucous PRESENT    ____________________________________________  EKG My review and personal interpretation at Time: 21:11   Indication: dizziness  Rate: 86  Rhythm: nsr Axis: normal Other: no acute ischemia, normal intervals ____________________________________________  RADIOLOGY  I personally reviewed all radiographic images ordered to evaluate for the above acute complaints and reviewed radiology reports and findings.  These findings were personally discussed with the patient.  Please see medical record for radiology report. ____________________________________________   PROCEDURES  Procedure(s) performed: none    Critical Care performed: no ____________________________________________   INITIAL IMPRESSION / ASSESSMENT AND PLAN / ED COURSE  Pertinent labs & imaging results that were available during my care of the patient were reviewed by me and considered in my medical decision making (see chart for details).  DDX: Vertigo, CVA, which led abnormality, dehydration  AARUSHI HEMRIC is a 75  y.o. who presents to the ED with complaint of persistent vertiginous  symptoms after being discharged home. Her vitals remained stable. She has no focal neuro deficit but I am concerned for possible posterior circulation ischemia. Symptoms do seem similar to peripheral vertigo given the positional component of her dizziness however she has no inducible nystagmus on exam. Based on her risk factors do feel further evaluation with MRI would be in the patient's best interest to rule out acute ischemia. Recheck of electrolytes shows improvement in sodium. I will give meclizine for her dizziness while we await MRI.  Clinical Course  Comment By Time  Patient reassessed and feels much improved after the meclizine. States the dizziness and nausea hasn't resolved. Repeat abdominal exam is reassuring. Merlyn Lot, MD 10/21 2246   Patient remains hemodynamically stable. Patient will be signed out to oncoming physician Dr. Karma Greaser.   ____________________________________________   FINAL CLINICAL IMPRESSION(S) / ED DIAGNOSES  Final diagnoses:  Dizziness      NEW MEDICATIONS STARTED DURING THIS VISIT:  New Prescriptions   No medications on file     Note:  This document was prepared using Dragon voice recognition software and may include unintentional dictation errors.    Merlyn Lot, MD 09/08/16 [phone]

## 2016-09-07 NOTE — ED Notes (Signed)
Patient transported to X-ray and then CT

## 2016-09-08 ENCOUNTER — Emergency Department: Payer: PPO

## 2016-09-08 DIAGNOSIS — R42 Dizziness and giddiness: Secondary | ICD-10-CM | POA: Diagnosis not present

## 2016-09-08 MED ORDER — MECLIZINE HCL 12.5 MG PO TABS: 12.5000 mg | ORAL_TABLET | Freq: Three times a day (TID) | ORAL | 0 refills | Status: DC | PRN

## 2016-09-08 NOTE — ED Notes (Signed)
Pt taken to MRI with Altha Harm, RN via stretcher.

## 2016-09-08 NOTE — ED Notes (Signed)
Pt called son to pick her up. Pt ambulated to toilet and back without assistance.

## 2016-09-17 DIAGNOSIS — I10 Essential (primary) hypertension: Secondary | ICD-10-CM | POA: Diagnosis not present

## 2016-09-17 DIAGNOSIS — I2510 Atherosclerotic heart disease of native coronary artery without angina pectoris: Secondary | ICD-10-CM | POA: Diagnosis not present

## 2016-09-17 DIAGNOSIS — E538 Deficiency of other specified B group vitamins: Secondary | ICD-10-CM | POA: Diagnosis not present

## 2016-09-17 DIAGNOSIS — J438 Other emphysema: Secondary | ICD-10-CM | POA: Diagnosis not present

## 2016-09-17 DIAGNOSIS — E279 Disorder of adrenal gland, unspecified: Secondary | ICD-10-CM | POA: Diagnosis not present

## 2016-09-17 DIAGNOSIS — J9611 Chronic respiratory failure with hypoxia: Secondary | ICD-10-CM | POA: Diagnosis not present

## 2016-09-17 DIAGNOSIS — M47816 Spondylosis without myelopathy or radiculopathy, lumbar region: Secondary | ICD-10-CM | POA: Diagnosis not present

## 2016-09-17 DIAGNOSIS — F325 Major depressive disorder, single episode, in full remission: Secondary | ICD-10-CM | POA: Diagnosis not present

## 2016-09-17 DIAGNOSIS — E782 Mixed hyperlipidemia: Secondary | ICD-10-CM | POA: Diagnosis not present

## 2016-09-17 DIAGNOSIS — I119 Hypertensive heart disease without heart failure: Secondary | ICD-10-CM | POA: Diagnosis not present

## 2016-09-17 DIAGNOSIS — I43 Cardiomyopathy in diseases classified elsewhere: Secondary | ICD-10-CM | POA: Diagnosis not present

## 2016-09-17 DIAGNOSIS — M1990 Unspecified osteoarthritis, unspecified site: Secondary | ICD-10-CM | POA: Diagnosis not present

## 2016-09-18 DIAGNOSIS — M5136 Other intervertebral disc degeneration, lumbar region: Secondary | ICD-10-CM | POA: Diagnosis not present

## 2016-09-18 DIAGNOSIS — M5416 Radiculopathy, lumbar region: Secondary | ICD-10-CM | POA: Diagnosis not present

## 2016-09-25 DIAGNOSIS — I119 Hypertensive heart disease without heart failure: Secondary | ICD-10-CM | POA: Diagnosis not present

## 2016-09-25 DIAGNOSIS — I2510 Atherosclerotic heart disease of native coronary artery without angina pectoris: Secondary | ICD-10-CM | POA: Diagnosis not present

## 2016-09-25 DIAGNOSIS — I43 Cardiomyopathy in diseases classified elsewhere: Secondary | ICD-10-CM | POA: Diagnosis not present

## 2016-09-25 DIAGNOSIS — I10 Essential (primary) hypertension: Secondary | ICD-10-CM | POA: Diagnosis not present

## 2016-09-25 DIAGNOSIS — J438 Other emphysema: Secondary | ICD-10-CM | POA: Diagnosis not present

## 2016-09-25 DIAGNOSIS — E782 Mixed hyperlipidemia: Secondary | ICD-10-CM | POA: Diagnosis not present

## 2016-10-04 DIAGNOSIS — J449 Chronic obstructive pulmonary disease, unspecified: Secondary | ICD-10-CM | POA: Diagnosis not present

## 2016-10-18 DIAGNOSIS — I10 Essential (primary) hypertension: Secondary | ICD-10-CM | POA: Diagnosis not present

## 2016-10-18 DIAGNOSIS — Z85038 Personal history of other malignant neoplasm of large intestine: Secondary | ICD-10-CM | POA: Diagnosis not present

## 2016-10-18 DIAGNOSIS — M5136 Other intervertebral disc degeneration, lumbar region: Secondary | ICD-10-CM | POA: Diagnosis not present

## 2016-10-18 DIAGNOSIS — E279 Disorder of adrenal gland, unspecified: Secondary | ICD-10-CM | POA: Diagnosis not present

## 2016-10-18 DIAGNOSIS — E538 Deficiency of other specified B group vitamins: Secondary | ICD-10-CM | POA: Diagnosis not present

## 2016-10-18 DIAGNOSIS — J438 Other emphysema: Secondary | ICD-10-CM | POA: Diagnosis not present

## 2016-10-18 DIAGNOSIS — I2510 Atherosclerotic heart disease of native coronary artery without angina pectoris: Secondary | ICD-10-CM | POA: Diagnosis not present

## 2016-10-18 DIAGNOSIS — R911 Solitary pulmonary nodule: Secondary | ICD-10-CM | POA: Diagnosis not present

## 2016-10-18 DIAGNOSIS — E782 Mixed hyperlipidemia: Secondary | ICD-10-CM | POA: Diagnosis not present

## 2016-10-18 DIAGNOSIS — I119 Hypertensive heart disease without heart failure: Secondary | ICD-10-CM | POA: Diagnosis not present

## 2016-10-18 DIAGNOSIS — M810 Age-related osteoporosis without current pathological fracture: Secondary | ICD-10-CM | POA: Diagnosis not present

## 2016-10-18 DIAGNOSIS — F325 Major depressive disorder, single episode, in full remission: Secondary | ICD-10-CM | POA: Diagnosis not present

## 2016-11-03 DIAGNOSIS — J449 Chronic obstructive pulmonary disease, unspecified: Secondary | ICD-10-CM | POA: Diagnosis not present

## 2016-11-19 DIAGNOSIS — E538 Deficiency of other specified B group vitamins: Secondary | ICD-10-CM | POA: Diagnosis not present

## 2016-11-25 DIAGNOSIS — M5412 Radiculopathy, cervical region: Secondary | ICD-10-CM | POA: Diagnosis not present

## 2016-11-25 DIAGNOSIS — M5416 Radiculopathy, lumbar region: Secondary | ICD-10-CM | POA: Diagnosis not present

## 2016-11-25 DIAGNOSIS — M62838 Other muscle spasm: Secondary | ICD-10-CM | POA: Diagnosis not present

## 2016-11-25 DIAGNOSIS — M5030 Other cervical disc degeneration, unspecified cervical region: Secondary | ICD-10-CM | POA: Diagnosis not present

## 2016-11-25 DIAGNOSIS — M5136 Other intervertebral disc degeneration, lumbar region: Secondary | ICD-10-CM | POA: Diagnosis not present

## 2016-11-26 ENCOUNTER — Ambulatory Visit: Payer: PPO

## 2016-12-04 DIAGNOSIS — J449 Chronic obstructive pulmonary disease, unspecified: Secondary | ICD-10-CM | POA: Diagnosis not present

## 2016-12-20 ENCOUNTER — Ambulatory Visit
Admission: RE | Admit: 2016-12-20 | Discharge: 2016-12-20 | Disposition: A | Discharge status: Home / Self Care | Payer: PPO | Source: Ambulatory Visit | Attending: Internal Medicine | Admitting: Internal Medicine

## 2016-12-20 DIAGNOSIS — E538 Deficiency of other specified B group vitamins: Secondary | ICD-10-CM | POA: Diagnosis not present

## 2016-12-20 DIAGNOSIS — Z1231 Encounter for screening mammogram for malignant neoplasm of breast: Secondary | ICD-10-CM

## 2016-12-28 ENCOUNTER — Emergency Department: Payer: PPO

## 2016-12-28 ENCOUNTER — Encounter: Payer: Self-pay | Admitting: Emergency Medicine

## 2016-12-28 ENCOUNTER — Observation Stay
Admission: EM | Admit: 2016-12-28 | Discharge: 2016-12-29 | Disposition: A | Discharge status: Home / Self Care | Payer: PPO | Attending: Specialist | Admitting: Specialist

## 2016-12-28 DIAGNOSIS — D72825 Bandemia: Secondary | ICD-10-CM | POA: Insufficient documentation

## 2016-12-28 DIAGNOSIS — F419 Anxiety disorder, unspecified: Secondary | ICD-10-CM | POA: Insufficient documentation

## 2016-12-28 DIAGNOSIS — R059 Cough, unspecified: Secondary | ICD-10-CM | POA: Diagnosis present

## 2016-12-28 DIAGNOSIS — E785 Hyperlipidemia, unspecified: Secondary | ICD-10-CM | POA: Diagnosis not present

## 2016-12-28 DIAGNOSIS — Z853 Personal history of malignant neoplasm of breast: Secondary | ICD-10-CM | POA: Diagnosis not present

## 2016-12-28 DIAGNOSIS — Z9221 Personal history of antineoplastic chemotherapy: Secondary | ICD-10-CM | POA: Diagnosis not present

## 2016-12-28 DIAGNOSIS — Z923 Personal history of irradiation: Secondary | ICD-10-CM | POA: Diagnosis not present

## 2016-12-28 DIAGNOSIS — Z79899 Other long term (current) drug therapy: Secondary | ICD-10-CM | POA: Insufficient documentation

## 2016-12-28 DIAGNOSIS — Z7982 Long term (current) use of aspirin: Secondary | ICD-10-CM | POA: Diagnosis not present

## 2016-12-28 DIAGNOSIS — F17210 Nicotine dependence, cigarettes, uncomplicated: Secondary | ICD-10-CM | POA: Diagnosis not present

## 2016-12-28 DIAGNOSIS — J449 Chronic obstructive pulmonary disease, unspecified: Secondary | ICD-10-CM | POA: Diagnosis not present

## 2016-12-28 DIAGNOSIS — D72829 Elevated white blood cell count, unspecified: Secondary | ICD-10-CM | POA: Diagnosis not present

## 2016-12-28 DIAGNOSIS — R0989 Other specified symptoms and signs involving the circulatory and respiratory systems: Secondary | ICD-10-CM | POA: Diagnosis not present

## 2016-12-28 DIAGNOSIS — R0602 Shortness of breath: Principal | ICD-10-CM | POA: Diagnosis present

## 2016-12-28 DIAGNOSIS — R52 Pain, unspecified: Secondary | ICD-10-CM | POA: Diagnosis not present

## 2016-12-28 DIAGNOSIS — Z85038 Personal history of other malignant neoplasm of large intestine: Secondary | ICD-10-CM | POA: Diagnosis not present

## 2016-12-28 DIAGNOSIS — J111 Influenza due to unidentified influenza virus with other respiratory manifestations: Secondary | ICD-10-CM | POA: Diagnosis not present

## 2016-12-28 DIAGNOSIS — R6889 Other general symptoms and signs: Secondary | ICD-10-CM

## 2016-12-28 DIAGNOSIS — I10 Essential (primary) hypertension: Secondary | ICD-10-CM | POA: Diagnosis not present

## 2016-12-28 DIAGNOSIS — F329 Major depressive disorder, single episode, unspecified: Secondary | ICD-10-CM | POA: Diagnosis not present

## 2016-12-28 DIAGNOSIS — R05 Cough: Secondary | ICD-10-CM | POA: Diagnosis present

## 2016-12-28 LAB — URINALYSIS, ROUTINE W REFLEX MICROSCOPIC
Bilirubin Urine: NEGATIVE
GLUCOSE, UA: NEGATIVE mg/dL
Hgb urine dipstick: NEGATIVE
Ketones, ur: 5 mg/dL — AB
LEUKOCYTES UA: NEGATIVE
Nitrite: NEGATIVE
PH: 8 (ref 5.0–8.0)
Protein, ur: NEGATIVE mg/dL
Specific Gravity, Urine: 1.01 (ref 1.005–1.030)

## 2016-12-28 LAB — CBC WITH DIFFERENTIAL/PLATELET
BASOS PCT: 0 %
Basophils Absolute: 0.1 10*3/uL (ref 0–0.1)
EOS PCT: 0 %
Eosinophils Absolute: 0 10*3/uL (ref 0–0.7)
HEMATOCRIT: 36.3 % (ref 35.0–47.0)
Hemoglobin: 12.6 g/dL (ref 12.0–16.0)
Lymphocytes Relative: 2 %
Lymphs Abs: 0.5 10*3/uL — ABNORMAL LOW (ref 1.0–3.6)
MCH: 28.6 pg (ref 26.0–34.0)
MCHC: 34.6 g/dL (ref 32.0–36.0)
MCV: 82.6 fL (ref 80.0–100.0)
MONO ABS: 1.1 10*3/uL — AB (ref 0.2–0.9)
MONOS PCT: 4 %
NEUTROS ABS: 30.1 10*3/uL — AB (ref 1.4–6.5)
Neutrophils Relative %: 94 %
PLATELETS: 203 10*3/uL (ref 150–440)
RBC: 4.4 MIL/uL (ref 3.80–5.20)
RDW: 14.2 % (ref 11.5–14.5)
WBC: 31.8 10*3/uL — ABNORMAL HIGH (ref 3.6–11.0)

## 2016-12-28 LAB — URINALYSIS, MICROSCOPIC (REFLEX)
BACTERIA UA: NONE SEEN
RBC / HPF: NONE SEEN RBC/hpf (ref 0–5)
WBC UA: NONE SEEN WBC/hpf (ref 0–5)

## 2016-12-28 LAB — BASIC METABOLIC PANEL
Anion gap: 8 (ref 5–15)
BUN: 13 mg/dL (ref 6–20)
CO2: 26 mmol/L (ref 22–32)
CREATININE: 0.55 mg/dL (ref 0.44–1.00)
Calcium: 9 mg/dL (ref 8.9–10.3)
Chloride: 99 mmol/L — ABNORMAL LOW (ref 101–111)
GFR calc non Af Amer: >60 mL/min (ref >60)
GLUCOSE: 114 mg/dL — AB (ref 65–99)
Potassium: 4.1 mmol/L (ref 3.5–5.1)
Sodium: 133 mmol/L — ABNORMAL LOW (ref 135–145)

## 2016-12-28 LAB — TROPONIN I: Troponin I: <0.03 ng/mL (ref <0.03)

## 2016-12-28 LAB — INFLUENZA PANEL BY PCR (TYPE A & B)
Influenza A By PCR: NEGATIVE
Influenza B By PCR: NEGATIVE

## 2016-12-28 LAB — LACTIC ACID, PLASMA: Lactic Acid, Venous: 1.2 mmol/L (ref 0.5–1.9)

## 2016-12-28 MED ORDER — SODIUM CHLORIDE 0.9 % IV BOLUS (SEPSIS)
1000.0000 mL | Freq: Once | INTRAVENOUS | Status: AC
  Administered 2016-12-28: 1000 mL via INTRAVENOUS

## 2016-12-28 NOTE — ED Notes (Signed)
Pt states that she cannot urinate at this time. IV attempt x1 in triage. Pt is a no stick on right side.

## 2016-12-28 NOTE — ED Triage Notes (Signed)
Pt arrives to triage with c/o flu like symptoms. Pt states that she is has been having generalized body aches for that time. Pt is in NAD at this time.

## 2016-12-28 NOTE — ED Notes (Signed)
In & Out cath urine obtained by Nellie, RN accompanied by Sharyn Lull, RN

## 2016-12-28 NOTE — ED Provider Notes (Signed)
[facility] Emergency Department Provider Note  ____________________________________________   I have reviewed the triage vital signs and the nursing notes.   HISTORY  Chief Complaint Generalized Body Aches   History limited by: Not Limited   HPI Robin Hudson is a 76 y.o. female who presents to the emergency department today because of concerns for feeling unwell. She states she has not been feeling well for the past 3 days. She describes body aches, cough and shortness of breath. This is a non-productive cough. The patient has not noticed any fevers. She has not had any nausea vomiting or diarrhea. She denies any change in urination.   Past Medical History:  Diagnosis Date  . Allergic rhinitis   . Anxiety   . Asthma   . B12 deficiency   . Breast cancer ([place]) 1999   RT LUMPECTOMY  . Carcinoma of right breast treated with adjuvant chemotherapy ([place]) 1999   RT LUMPECTOMY  . CHF (congestive heart failure) ([place])   . Colon cancer ([place]) 2010  . Colon cancer ([place])   . COPD (chronic obstructive pulmonary disease) ([place])   . Hyperlipemia   . Hypertension   . MRSA pneumonia ([place])   . Osteoporosis   . Radiation 1999   BREAST CA  . Right adrenal mass [facility])     Patient Active Problem List   Diagnosis Date Noted  . Hypoxia 12/06/2015  . COPD exacerbation ([place]) 12/03/2015  . Elevated troponin 12/03/2015  . Elevated antinuclear antibody (ANA) level 05/15/2015  . Hyperlipemia 05/12/2015  . COPD (chronic obstructive pulmonary disease) ([place]) 05/12/2015  . HTN (hypertension) 05/12/2015  . Osteoporosis 05/12/2015  . Right adrenal mass ([place]) 05/12/2015  . B12 deficiency 05/12/2015  . Allergic rhinitis 05/12/2015  . Breast cancer ([place]) 05/12/2015  . Colon cancer ([place]) 05/12/2015  . Closed head injury 12/15/2014    Past Surgical History:  Procedure Laterality Date  . ABDOMINAL HYSTERECTOMY    . APPENDECTOMY    . BREAST SURGERY Right    lumpectomy x2  with lymph nodes  . CARPAL TUNNEL RELEASE Left 06/04/2016   Procedure: CARPAL TUNNEL RELEASE;  Surgeon: Hessie Knows, MD;  Location: [facility] ORS;  Service: Orthopedics;  Laterality: Left;  . colectomy     partial  . IMAGE GUIDED SINUS SURGERY    . OPEN REDUCTION INTERNAL FIXATION (ORIF) DISTAL RADIAL FRACTURE Left 06/04/2016   Procedure: OPEN REDUCTION INTERNAL FIXATION (ORIF) DISTAL RADIAL FRACTURE;  Surgeon: Hessie Knows, MD;  Location: [facility] ORS;  Service: Orthopedics;  Laterality: Left;    Prior to Admission medications   Medication Sig Start Date End Date Taking? Authorizing Provider  ALPRAZolam Duanne Moron) 0.5 MG tablet Take 1 tablet by mouth 2 (two) times daily as needed for anxiety.  04/19/15   Historical Provider, MD  aspirin EC 81 MG tablet Take 1 tablet (81 mg total) by mouth daily. 06/28/16   Fritzi Mandes, MD  atenolol (TENORMIN) 50 MG tablet Take 1 tablet (50 mg total) by mouth daily. 05/18/15   Tama High III, MD  azelastine (ASTELIN) 0.1 % nasal spray Place 1 spray into both nostrils 2 (two) times daily. Use in each nostril as directed    Historical Provider, MD  cholecalciferol (VITAMIN D) 1000 units tablet Take 1,000 Units by mouth daily.    Historical Provider, MD  doxycycline (VIBRAMYCIN) 100 MG capsule Take 1 capsule (100 mg) by mouth twice daily for 10 days. 09/04/16   Hinda Kehr, MD  escitalopram (LEXAPRO) 10 MG  tablet Take 1 tablet by mouth daily. 04/12/15   Historical Provider, MD  HYDROcodone-acetaminophen (NORCO) 10-325 MG per tablet Take 0.5-1 tablets by mouth 3 (three) times daily as needed for moderate pain or severe pain.  04/28/15   Historical Provider, MD  hydroxychloroquine (PLAQUENIL) 200 MG tablet Take 400 mg by mouth daily. Reported on 12/06/2015    Historical Provider, MD  losartan (COZAAR) 50 MG tablet Take 1 tablet (50 mg total) by mouth daily. 05/18/15   Tama High III, MD  meclizine (ANTIVERT) 12.5 MG tablet Take 1 tablet (12.5 mg total) by mouth 3 (three) times daily  as needed for dizziness. 09/08/16   Merlyn Lot, MD  montelukast (SINGULAIR) 10 MG tablet Take 10 mg by mouth at bedtime.    Historical Provider, MD  predniSONE (DELTASONE) 10 MG tablet Label  & dispense according to the schedule below. 5 Pills PO for 1 day then, 4 Pills PO for 1 day, 3 Pills PO for 1 day, 2 Pills PO for 1 day, 1 Pill PO for 1 days then STOP. 07/20/16   Henreitta Leber, MD  PROAIR HFA 108 (90 BASE) MCG/ACT inhaler Take 2 puffs by mouth every 6 (six) hours as needed for wheezing.  04/12/15   Historical Provider, MD  SPIRIVA HANDIHALER 18 MCG inhalation capsule Take 1 Inhaler by mouth daily. 04/24/15   Historical Provider, MD    Allergies Coreg [carvedilol]  Family History  Problem Relation Age of Onset  . Heart attack Mother   . Breast cancer Other   . Breast cancer Sister 30  . Breast cancer Sister 82    Social History Social History  Substance Use Topics  . Smoking status: Current Some Day Smoker    Packs/day: 0.25    Types: Cigarettes  . Smokeless tobacco: Never Used  . Alcohol use No    Review of Systems  Constitutional: Negative for fever. Cardiovascular: Negative for chest pain. Respiratory: Negative for shortness of breath. Gastrointestinal: Negative for abdominal pain, vomiting and diarrhea. Genitourinary: Negative for dysuria. Musculoskeletal: Positive for chronic back pain. Neurological: Negative for headaches, focal weakness or numbness.  10-point ROS otherwise negative.  ____________________________________________   PHYSICAL EXAM:  VITAL SIGNS: ED Triage Vitals  Enc Vitals Group     BP 12/28/16 2028 (!) 131/50     Pulse Rate 12/28/16 2028 92     Resp 12/28/16 2028 (!) 22     Temp 12/28/16 2028 98.5 F (36.9 C)     Temp Source 12/28/16 2028 Oral     SpO2 12/28/16 2028 92 %     Weight 12/28/16 2028 111 lb (50.3 kg)     Height 12/28/16 2028 '5\' 4"'$  (1.626 m)     Head Circumference --      Peak Flow --      Pain Score 12/28/16 2029 6    Constitutional: Alert and oriented. Well appearing and in no distress. Eyes: Conjunctivae are normal. Normal extraocular movements. ENT   Head: Normocephalic and atraumatic.   Nose: No congestion/rhinnorhea.   Mouth/Throat: Mucous membranes are moist.   Neck: No stridor. Hematological/Lymphatic/Immunilogical: No cervical lymphadenopathy. Cardiovascular: Normal rate, regular rhythm.  No murmurs, rubs, or gallops.  Respiratory: Normal respiratory effort without tachypnea nor retractions. Breath sounds are clear and equal bilaterally. No wheezes/rales/rhonchi. Gastrointestinal: Soft and non tender. No rebound. No guarding.  Genitourinary: Deferred Musculoskeletal: Normal range of motion in all extremities. No lower extremity edema. Neurologic:  Normal speech and language. No gross focal neurologic  deficits are appreciated.  Skin:  Skin is warm, dry and intact. No rash noted. Psychiatric: Mood and affect are normal. Speech and behavior are normal. Patient exhibits appropriate insight and judgment.  ____________________________________________    LABS (pertinent positives/negatives)  WBC 31.8 Neut 30.1 Lactic 1.2  ____________________________________________   EKG  I, Nance Pear, attending physician, personally viewed and interpreted this EKG  EKG Time: 2041 Rate: 94 Rhythm: normal sinus rhythm Axis: normal Intervals: qtc 467 QRS: LVH ST changes: no st elevation Impression: abnormal ekg   ____________________________________________    RADIOLOGY  CXR IMPRESSION: Stable chronic changes in the lungs.  ____________________________________________   PROCEDURES  Procedures  ____________________________________________   INITIAL IMPRESSION / ASSESSMENT AND PLAN / ED COURSE  Pertinent labs & imaging results that were available during my care of the patient were reviewed by me and considered in my medical decision making (see chart for  details).  Patient presented to the emergency department today because she is feeling unwell for the past 3 days. Workup without any obvious cause have the patient's white blood cell count was elevated to 31.8. Given this concern patient will be given broad-spectrum antibiotics. Blood cultures were sent. Patient will be admitted to the hospital service.  ____________________________________________   FINAL CLINICAL IMPRESSION(S) / ED DIAGNOSES  Final diagnoses:  Flu-like symptoms  Leukocytosis, unspecified type     Note: This dictation was prepared with Dragon dictation. Any transcriptional errors that result from this process are unintentional     Nance Pear, MD 12/29/16 1715

## 2016-12-29 DIAGNOSIS — J449 Chronic obstructive pulmonary disease, unspecified: Secondary | ICD-10-CM | POA: Diagnosis not present

## 2016-12-29 DIAGNOSIS — R0602 Shortness of breath: Secondary | ICD-10-CM | POA: Diagnosis not present

## 2016-12-29 DIAGNOSIS — D72829 Elevated white blood cell count, unspecified: Secondary | ICD-10-CM | POA: Diagnosis not present

## 2016-12-29 DIAGNOSIS — I10 Essential (primary) hypertension: Secondary | ICD-10-CM | POA: Diagnosis not present

## 2016-12-29 DIAGNOSIS — R05 Cough: Secondary | ICD-10-CM | POA: Diagnosis present

## 2016-12-29 DIAGNOSIS — R059 Cough, unspecified: Secondary | ICD-10-CM | POA: Diagnosis present

## 2016-12-29 LAB — CBC
HEMATOCRIT: 37 % (ref 35.0–47.0)
Hemoglobin: 12.2 g/dL (ref 12.0–16.0)
MCH: 28 pg (ref 26.0–34.0)
MCHC: 33 g/dL (ref 32.0–36.0)
MCV: 84.9 fL (ref 80.0–100.0)
Platelets: 189 10*3/uL (ref 150–440)
RBC: 4.36 MIL/uL (ref 3.80–5.20)
RDW: 14.8 % — AB (ref 11.5–14.5)
WBC: 24.4 10*3/uL — AB (ref 3.6–11.0)

## 2016-12-29 LAB — BASIC METABOLIC PANEL
Anion gap: 7 (ref 5–15)
BUN: 12 mg/dL (ref 6–20)
CHLORIDE: 101 mmol/L (ref 101–111)
CO2: 27 mmol/L (ref 22–32)
Calcium: 8.7 mg/dL — ABNORMAL LOW (ref 8.9–10.3)
Creatinine, Ser: 0.7 mg/dL (ref 0.44–1.00)
GFR calc Af Amer: >60 mL/min (ref >60)
GFR calc non Af Amer: >60 mL/min (ref >60)
GLUCOSE: 110 mg/dL — AB (ref 65–99)
POTASSIUM: 4.1 mmol/L (ref 3.5–5.1)
Sodium: 135 mmol/L (ref 135–145)

## 2016-12-29 MED ORDER — PNEUMOCOCCAL VAC POLYVALENT 25 MCG/0.5ML IJ INJ: 0.5000 mL | INJECTION | INTRAMUSCULAR | Status: DC

## 2016-12-29 MED ORDER — ONDANSETRON HCL 4 MG/2ML IJ SOLN
4.0000 mg | Freq: Four times a day (QID) | INTRAMUSCULAR | Status: DC | PRN
Start: 2016-12-29 — End: 2016-12-29

## 2016-12-29 MED ORDER — ATENOLOL 50 MG PO TABS
50.0000 mg | ORAL_TABLET | Freq: Every day | ORAL | Status: DC
  Administered 2016-12-29: 50 mg via ORAL
  Filled 2016-12-29: qty 1

## 2016-12-29 MED ORDER — BENZONATATE 100 MG PO CAPS
200.0000 mg | ORAL_CAPSULE | Freq: Three times a day (TID) | ORAL | Status: DC | PRN
Start: 2016-12-29 — End: 2016-12-29

## 2016-12-29 MED ORDER — ACETAMINOPHEN 650 MG RE SUPP: 650.0000 mg | Freq: Four times a day (QID) | RECTAL | Status: DC | PRN

## 2016-12-29 MED ORDER — DEXTROSE 5 % IV SOLN: 1.0000 g | Freq: Once | INTRAVENOUS | Status: DC

## 2016-12-29 MED ORDER — AZITHROMYCIN 500 MG PO TABS
500.0000 mg | ORAL_TABLET | Freq: Every day | ORAL | Status: DC
  Administered 2016-12-29: 10:00:00 500 mg via ORAL
  Filled 2016-12-29: qty 1

## 2016-12-29 MED ORDER — GUAIFENESIN-DM 100-10 MG/5ML PO SYRP: 5.0000 mL | ORAL_SOLUTION | ORAL | Status: DC | PRN

## 2016-12-29 MED ORDER — CEFTRIAXONE SODIUM-DEXTROSE 1-3.74 GM-% IV SOLR
1.0000 g | Freq: Once | INTRAVENOUS | Status: AC
  Administered 2016-12-29: 1 g via INTRAVENOUS
  Filled 2016-12-29: qty 50

## 2016-12-29 MED ORDER — ONDANSETRON HCL 4 MG PO TABS: 4.0000 mg | ORAL_TABLET | Freq: Four times a day (QID) | ORAL | Status: DC | PRN

## 2016-12-29 MED ORDER — SODIUM CHLORIDE 0.9% FLUSH
3.0000 mL | Freq: Two times a day (BID) | INTRAVENOUS | Status: DC
  Administered 2016-12-29 (×2): 3 mL via INTRAVENOUS

## 2016-12-29 MED ORDER — IPRATROPIUM-ALBUTEROL 0.5-2.5 (3) MG/3ML IN SOLN
3.0000 mL | RESPIRATORY_TRACT | Status: DC | PRN
Start: 2016-12-29 — End: 2016-12-29

## 2016-12-29 MED ORDER — ASPIRIN EC 81 MG PO TBEC
81.0000 mg | DELAYED_RELEASE_TABLET | Freq: Every day | ORAL | Status: DC
  Administered 2016-12-29: 81 mg via ORAL
  Filled 2016-12-29: qty 1

## 2016-12-29 MED ORDER — ENOXAPARIN SODIUM 40 MG/0.4ML ~~LOC~~ SOLN: 40.0000 mg | SUBCUTANEOUS | Status: DC

## 2016-12-29 MED ORDER — ACETAMINOPHEN 325 MG PO TABS: 650.0000 mg | ORAL_TABLET | Freq: Four times a day (QID) | ORAL | Status: DC | PRN

## 2016-12-29 MED ORDER — LEVOFLOXACIN 500 MG PO TABS
500.0000 mg | ORAL_TABLET | Freq: Every day | ORAL | 0 refills | Status: AC
Start: 2016-12-29 — End: 2017-01-03

## 2016-12-29 MED ORDER — ESCITALOPRAM OXALATE 10 MG PO TABS
10.0000 mg | ORAL_TABLET | Freq: Every day | ORAL | Status: DC
Start: 2016-12-29 — End: 2016-12-29
  Administered 2016-12-29: 10 mg via ORAL
  Filled 2016-12-29: qty 1

## 2016-12-29 MED ORDER — DEXTROSE 5 % IV SOLN
1.0000 g | INTRAVENOUS | Status: DC
  Filled 2016-12-29: qty 10

## 2016-12-29 MED ORDER — HYDROCODONE-ACETAMINOPHEN 10-325 MG PO TABS
0.5000 | ORAL_TABLET | Freq: Three times a day (TID) | ORAL | Status: DC | PRN
  Administered 2016-12-29: 1 via ORAL
  Filled 2016-12-29: qty 1

## 2016-12-29 MED ORDER — MORPHINE SULFATE (PF) 2 MG/ML IV SOLN: 2.0000 mg | Freq: Once | INTRAVENOUS | Status: DC

## 2016-12-29 MED ORDER — LOSARTAN POTASSIUM 50 MG PO TABS
50.0000 mg | ORAL_TABLET | Freq: Every day | ORAL | Status: DC
  Administered 2016-12-29: 50 mg via ORAL
  Filled 2016-12-29: qty 1

## 2016-12-29 MED ORDER — TIOTROPIUM BROMIDE MONOHYDRATE 18 MCG IN CAPS
18.0000 ug | ORAL_CAPSULE | Freq: Every day | RESPIRATORY_TRACT | Status: DC
  Administered 2016-12-29: 08:00:00 18 ug via RESPIRATORY_TRACT
  Filled 2016-12-29: qty 5

## 2016-12-29 MED ORDER — HYDROXYCHLOROQUINE SULFATE 200 MG PO TABS
400.0000 mg | ORAL_TABLET | Freq: Every day | ORAL | Status: DC
Start: 2016-12-29 — End: 2016-12-29
  Administered 2016-12-29: 400 mg via ORAL
  Filled 2016-12-29 (×2): qty 2

## 2016-12-29 MED ORDER — MONTELUKAST SODIUM 10 MG PO TABS: 10.0000 mg | ORAL_TABLET | Freq: Every day | ORAL | Status: DC

## 2016-12-29 MED ORDER — ALPRAZOLAM 0.5 MG PO TABS: 0.5000 mg | ORAL_TABLET | Freq: Two times a day (BID) | ORAL | Status: DC | PRN

## 2016-12-29 NOTE — Progress Notes (Signed)
Discharge instructions along with home medications and follow up gone over with patient and husband. Both verbalize that they understood instructions. One prescription given to patient. IV and tele removed. Pt being discharged home on room air, no distress noted. Ammie Dalton, RN

## 2016-12-29 NOTE — Care Management Obs Status (Signed)
[facility] NOTIFICATION   Patient Details  Name: Robin Hudson MRN: [id number] Date of Birth: 04-Feb-1941   Medicare Observation Status Notification Given:  No MOON given per discharged in less than 24 hours after admission.     Doneen Ollinger A, RN 12/29/2016, 10:42 AM

## 2016-12-29 NOTE — Progress Notes (Signed)
Pharmacy Antibiotic Note  Robin Hudson is a 76 y.o. female admitted on 12/28/2016 with leukocytosis.  Pharmacy has been consulted for ceftriaxone dosing.  Plan: Ceftriaxone 1 gram q 24 hours ordered.  Height: '5\' 4"'$  (162.6 cm) Weight: 111 lb (50.3 kg) IBW/kg (Calculated) : 54.7  Temp (24hrs), Avg:98.4 F (36.9 C), Min:98.2 F (36.8 C), Max:98.5 F (36.9 C)   Recent Labs Lab 12/28/16 2048 12/28/16 2158  WBC 31.8*  --   CREATININE 0.55  --   LATICACIDVEN  --  1.2    Estimated Creatinine Clearance: 49 mL/min (by C-G formula based on SCr of 0.55 mg/dL).    Allergies  Allergen Reactions  . Coreg [Carvedilol] Other (See Comments)    syncope    Antimicrobials this admission: Ceftriaxone, azithromycin 2/11 >>    >>   Dose adjustments this admission:   Microbiology results: 2/11 BCx: pending 2/10 UCx: pending  07/20/16 MRSA PCR: (-)    2/10 UA: (-)  Thank you for allowing pharmacy to be a part of this patient's care.  Hyrum Shaneyfelt S 12/29/2016 1:50 AM

## 2016-12-29 NOTE — ED Notes (Signed)
Admitting MD at bedside.

## 2016-12-29 NOTE — ED Notes (Signed)
ED tech has finished drawing blood cultures x 2; pt is sleeping soundly with even and unlabored respirations

## 2016-12-29 NOTE — Progress Notes (Signed)
Notified Dr. Marcille Blanco of 12 beat run of SVT and HR up to 140's, not sustained. Pt currently NSR HR 80-90's/ New order to give scheduled AM antiarrhythmia meds now.

## 2016-12-29 NOTE — H&P (Signed)
[facility] [place] NAME: Robin Hudson    MR#:  [id number]  DATE OF BIRTH:  [birth year]  DATE OF ADMISSION:  12/28/2016  PRIMARY CARE PHYSICIAN: Adin Hector, MD   REQUESTING/REFERRING PHYSICIAN: Archie Balboa, MD  CHIEF COMPLAINT:   Chief Complaint  Patient presents with  . Generalized Body Aches    HISTORY OF PRESENT ILLNESS:  Robin Hudson  is a 76 y.o. female who presents with Body aches, cough and shortness of breath. Here in the ED she was found to be flu negative, and no clear source of infection was identified, but she was found to have a leukocytosis with significant bandemia. Hospitalists were called for admission  PAST MEDICAL HISTORY:   Past Medical History:  Diagnosis Date  . Allergic rhinitis   . Anxiety   . Asthma   . B12 deficiency   . Breast cancer ([place]) 1999   RT LUMPECTOMY  . Carcinoma of right breast treated with adjuvant chemotherapy ([place]) 1999   RT LUMPECTOMY  . CHF (congestive heart failure) ([place])   . Colon cancer ([place]) 2010  . Colon cancer ([place])   . COPD (chronic obstructive pulmonary disease) ([place])   . Hyperlipemia   . Hypertension   . MRSA pneumonia ([place])   . Osteoporosis   . Radiation 1999   BREAST CA  . Right adrenal mass ([place])     PAST SURGICAL HISTORY:   Past Surgical History:  Procedure Laterality Date  . ABDOMINAL HYSTERECTOMY    . APPENDECTOMY    . BREAST SURGERY Right    lumpectomy x2 with lymph nodes  . CARPAL TUNNEL RELEASE Left 06/04/2016   Procedure: CARPAL TUNNEL RELEASE;  Surgeon: Hessie Knows, MD;  Location: [facility] ORS;  Service: Orthopedics;  Laterality: Left;  . colectomy     partial  . IMAGE GUIDED SINUS SURGERY    . OPEN REDUCTION INTERNAL FIXATION (ORIF) DISTAL RADIAL FRACTURE Left 06/04/2016   Procedure: OPEN REDUCTION INTERNAL FIXATION (ORIF) DISTAL RADIAL FRACTURE;  Surgeon: Hessie Knows, MD;  Location: [facility] ORS;  Service: Orthopedics;  Laterality: Left;     SOCIAL HISTORY:   Social History  Substance Use Topics  . Smoking status: Current Some Day Smoker    Packs/day: 0.25    Types: Cigarettes  . Smokeless tobacco: Never Used  . Alcohol use No    FAMILY HISTORY:   Family History  Problem Relation Age of Onset  . Heart attack Mother   . Breast cancer Other   . Breast cancer Sister 74  . Breast cancer Sister 70    DRUG ALLERGIES:   Allergies  Allergen Reactions  . Coreg [Carvedilol] Other (See Comments)    syncope    MEDICATIONS AT HOME:   Prior to Admission medications   Medication Sig Start Date End Date Taking? Authorizing Provider  ALPRAZolam Duanne Moron) 0.5 MG tablet Take 1 tablet by mouth 2 (two) times daily as needed for anxiety.  04/19/15   Historical Provider, MD  aspirin EC 81 MG tablet Take 1 tablet (81 mg total) by mouth daily. 06/28/16   Fritzi Mandes, MD  atenolol (TENORMIN) 50 MG tablet Take 1 tablet (50 mg total) by mouth daily. 05/18/15   Tama High III, MD  azelastine (ASTELIN) 0.1 % nasal spray Place 1 spray into both nostrils 2 (two) times daily. Use in each nostril as directed    Historical Provider, MD  cholecalciferol (VITAMIN D) 1000 units tablet Take 1,000 Units  by mouth daily.    Historical Provider, MD  doxycycline (VIBRAMYCIN) 100 MG capsule Take 1 capsule (100 mg) by mouth twice daily for 10 days. 09/04/16   Hinda Kehr, MD  escitalopram (LEXAPRO) 10 MG tablet Take 1 tablet by mouth daily. 04/12/15   Historical Provider, MD  HYDROcodone-acetaminophen (NORCO) 10-325 MG per tablet Take 0.5-1 tablets by mouth 3 (three) times daily as needed for moderate pain or severe pain.  04/28/15   Historical Provider, MD  hydroxychloroquine (PLAQUENIL) 200 MG tablet Take 400 mg by mouth daily. Reported on 12/06/2015    Historical Provider, MD  losartan (COZAAR) 50 MG tablet Take 1 tablet (50 mg total) by mouth daily. 05/18/15   Tama High III, MD  meclizine (ANTIVERT) 12.5 MG tablet Take 1 tablet (12.5 mg total) by  mouth 3 (three) times daily as needed for dizziness. 09/08/16   Merlyn Lot, MD  montelukast (SINGULAIR) 10 MG tablet Take 10 mg by mouth at bedtime.    Historical Provider, MD  predniSONE (DELTASONE) 10 MG tablet Label  & dispense according to the schedule below. 5 Pills PO for 1 day then, 4 Pills PO for 1 day, 3 Pills PO for 1 day, 2 Pills PO for 1 day, 1 Pill PO for 1 days then STOP. 07/20/16   Henreitta Leber, MD  PROAIR HFA 108 (90 BASE) MCG/ACT inhaler Take 2 puffs by mouth every 6 (six) hours as needed for wheezing.  04/12/15   Historical Provider, MD  SPIRIVA HANDIHALER 18 MCG inhalation capsule Take 1 Inhaler by mouth daily. 04/24/15   Historical Provider, MD    REVIEW OF SYSTEMS:  Review of Systems  Constitutional: Positive for malaise/fatigue. Negative for chills, fever and weight loss.  HENT: Negative for ear pain, hearing loss and tinnitus.   Eyes: Negative for blurred vision, double vision, pain and redness.  Respiratory: Positive for cough and shortness of breath. Negative for hemoptysis.   Cardiovascular: Negative for chest pain, palpitations, orthopnea and leg swelling.  Gastrointestinal: Negative for abdominal pain, constipation, diarrhea, nausea and vomiting.  Genitourinary: Negative for dysuria, frequency and hematuria.  Musculoskeletal: Negative for back pain, joint pain and neck pain.  Skin:       No acne, rash, or lesions  Neurological: Negative for dizziness, tremors, focal weakness and weakness.  Endo/Heme/Allergies: Negative for polydipsia. Does not bruise/bleed easily.  Psychiatric/Behavioral: Negative for depression. The patient is not nervous/anxious and does not have insomnia.      VITAL SIGNS:   Vitals:   12/28/16 2230 12/28/16 2300 12/28/16 2330 12/29/16 0000  BP: (!) 132/59 (!) 144/68 139/66 (!) 141/69  Pulse:   86 86  Resp:      Temp:      TempSrc:      SpO2:   95% 95%  Weight:      Height:       Wt Readings from Last 3 Encounters:  12/28/16  50.3 kg (111 lb)  09/07/16 52.2 kg (115 lb)  09/07/16 52.2 kg (115 lb)    PHYSICAL EXAMINATION:  Physical Exam  Vitals reviewed. Constitutional: She is oriented to person, place, and time. She appears well-developed and well-nourished. No distress.  HENT:  Head: Normocephalic and atraumatic.  Mouth/Throat: Oropharynx is clear and moist.  Eyes: Conjunctivae and EOM are normal. Pupils are equal, round, and reactive to light. No scleral icterus.  Neck: Normal range of motion. Neck supple. No JVD present. No thyromegaly present.  Cardiovascular: Normal rate, regular rhythm and intact  distal pulses.  Exam reveals no gallop and no friction rub.   No murmur heard. Respiratory: She is in respiratory distress (mild). She has wheezes. She has no rales.  GI: Soft. Bowel sounds are normal. She exhibits no distension. There is no tenderness.  Musculoskeletal: Normal range of motion. She exhibits no edema.  No arthritis, no gout  Lymphadenopathy:    She has no cervical adenopathy.  Neurological: She is alert and oriented to person, place, and time. No cranial nerve deficit.  No dysarthria, no aphasia  Skin: Skin is warm and dry. No rash noted. No erythema.  Psychiatric: She has a normal mood and affect. Her behavior is normal. Judgment and thought content normal.    LABORATORY PANEL:   CBC  Recent Labs Lab 12/28/16 2048  WBC 31.8*  HGB 12.6  HCT 36.3  PLT 203   ------------------------------------------------------------------------------------------------------------------  Chemistries   Recent Labs Lab 12/28/16 2048  NA 133*  K 4.1  CL 99*  CO2 26  GLUCOSE 114*  BUN 13  CREATININE 0.55  CALCIUM 9.0   ------------------------------------------------------------------------------------------------------------------  Cardiac Enzymes  Recent Labs Lab 12/28/16 2048  TROPONINI <0.03    ------------------------------------------------------------------------------------------------------------------  RADIOLOGY:  Dg Chest 2 View  Result Date: 12/28/2016 CLINICAL DATA:  Flu like symptoms. EXAM: CHEST  2 VIEW COMPARISON:  September 07, 2016 FINDINGS: Stable scarring in the apices. The cardiomediastinal silhouette is not significantly changed. No pneumothorax. No acute abnormalities identified. IMPRESSION: Stable chronic changes in the lungs. Electronically Signed   By: Dorise Bullion III M.D   On: 12/28/2016 21:22    EKG:   Orders placed or performed during the hospital encounter of 12/28/16  . ED EKG  . ED EKG    IMPRESSION AND PLAN:  Principal Problem:   Shortness of breath - IV antibiotics given in the ED, we'll continue these on admission with preliminary treatment for COPD/possible pneumonia Active Problems:   Leukocytosis - respiratory infection suspected, continue antibiotics, follow up on cultures   COPD (chronic obstructive pulmonary disease) ([place]) - continue home inhalers, when necessary DuoNeb's, antibiotics   HTN (hypertension) - continue home meds   Cough - treatment as above  All the records are reviewed and case discussed with ED provider. Management plans discussed with the patient and/or family.  DVT PROPHYLAXIS: SubQ lovenox  GI PROPHYLAXIS: None  ADMISSION STATUS: Observation  CODE STATUS: Full Code Status History    Date Active Date Inactive Code Status Order ID Comments User Context   07/19/2016 10:56 PM 07/20/2016  4:35 PM Full Code [id number]  Harvie Bridge, DO Inpatient   12/06/2015  2:00 AM 12/07/2015  3:34 PM Full Code [id number]  Harrie Foreman, MD Inpatient   12/03/2015 12:49 AM 12/04/2015  5:21 PM Full Code [id number]  Lytle Butte, MD ED   05/12/2015  3:09 PM 05/18/2015 12:39 PM Full Code [id number]  Dustin Flock, MD Inpatient      TOTAL TIME TAKING CARE OF THIS PATIENT: 40 minutes.    Jonerik Sliker [place] 12/29/2016, 12:15  AM  [facility]  Office  [phone]  CC: Primary care physician; Adin Hector, MD

## 2016-12-29 NOTE — Discharge Summary (Signed)
[facility] NAME: Robin Hudson    MR#:  [id number]  DATE OF BIRTH:  Mar 05, 1941  DATE OF ADMISSION:  12/28/2016 ADMITTING PHYSICIAN: Lance Coon, MD  DATE OF DISCHARGE: 12/29/2016 11:11 AM  PRIMARY CARE PHYSICIAN: Tama High III, MD    ADMISSION DIAGNOSIS:  Flu-like symptoms [R68.89] Leukocytosis, unspecified type [D72.829]  DISCHARGE DIAGNOSIS:  Principal Problem:   Shortness of breath Active Problems:   COPD (chronic obstructive pulmonary disease) (HCC)   HTN (hypertension)   Leukocytosis   Cough   SECONDARY DIAGNOSIS:   Past Medical History:  Diagnosis Date  . Allergic rhinitis   . Anxiety   . Asthma   . B12 deficiency   . Breast cancer ([place]) 1999   RT LUMPECTOMY  . Carcinoma of right breast treated with adjuvant chemotherapy ([place]) 1999   RT LUMPECTOMY  . CHF (congestive heart failure) ([place])   . Colon cancer ([place]) 2010  . Colon cancer ([place])   . COPD (chronic obstructive pulmonary disease) ([place])   . Hyperlipemia   . Hypertension   . MRSA pneumonia ([place])   . Osteoporosis   . Radiation 1999   BREAST CA  . Right adrenal mass [facility])     HOSPITAL COURSE:   76 year old female with past medical history of COPD, hypertension, hyperlipidemia, history of colon cancer, history of CHF, history of breast cancer who presented to the hospital due to cough, shortness of breath and generalized body aches.  1. Shortness of breath-this is probably secondary to patient's COPD with suspected underlying pneumonia. -Patient was admitted to the hospital started on IV ceftriaxone, Zithromax. Although the patient's chest x-ray was negative on admission. -Patient did have a leukocytosis with bandemia but her leukocytosis could also be related to her prednisone which she just finished less than a week prior to admission. -She has clinically improved and was ambulated didn't after admission and feels better and is not having any  worsening hypoxemia and therefore being discharged home.  2. Leukocytosis-secondary to patient's finishing a prednisone taper combined with underlying suspected pneumonia. -The white cell count did trend down. Patient was discharged empirically on oral Levaquin.  3. COPD-patient had no acute exacerbations-she will continue her DuoNeb's as needed inhalers at home.  4. Essential hypertension-patient will continue her losartan, atenolol.  5. Anxiety/depression-patient will continue her Xanax and Lexapro.  DISCHARGE CONDITIONS:   Stable  CONSULTS OBTAINED:    DRUG ALLERGIES:   Allergies  Allergen Reactions  . Coreg [Carvedilol] Other (See Comments)    syncope    DISCHARGE MEDICATIONS:   Allergies as of 12/29/2016      Reactions   Coreg [carvedilol] Other (See Comments)   syncope      Medication List    STOP taking these medications   doxycycline 100 MG capsule Commonly known as:  VIBRAMYCIN   predniSONE 10 MG tablet Commonly known as:  DELTASONE     TAKE these medications   ALPRAZolam 0.5 MG tablet Commonly known as:  XANAX Take 1 tablet by mouth 2 (two) times daily as needed for anxiety.   aspirin EC 81 MG tablet Take 1 tablet (81 mg total) by mouth daily.   atenolol 50 MG tablet Commonly known as:  TENORMIN Take 1 tablet (50 mg total) by mouth daily. What changed:  how much to take   azelastine 0.1 % nasal spray Commonly known as:  ASTELIN Place 1 spray into both nostrils 2 (two) times daily. Use  in each nostril as directed   cholecalciferol 1000 units tablet Commonly known as:  VITAMIN D Take 1,000 Units by mouth daily.   escitalopram 10 MG tablet Commonly known as:  LEXAPRO Take 1 tablet by mouth daily.   HYDROcodone-acetaminophen 10-325 MG tablet Commonly known as:  NORCO Take 0.5-1 tablets by mouth 3 (three) times daily as needed for moderate pain or severe pain.   hydroxychloroquine 200 MG tablet Commonly known as:  PLAQUENIL Take 400 mg by  mouth daily. Reported on 12/06/2015   levofloxacin 500 MG tablet Commonly known as:  LEVAQUIN Take 1 tablet (500 mg total) by mouth daily.   losartan 50 MG tablet Commonly known as:  COZAAR Take 1 tablet (50 mg total) by mouth daily.   meclizine 12.5 MG tablet Commonly known as:  ANTIVERT Take 1 tablet (12.5 mg total) by mouth 3 (three) times daily as needed for dizziness.   montelukast 10 MG tablet Commonly known as:  SINGULAIR Take 10 mg by mouth at bedtime.   albuterol (2.5 MG/3ML) 0.083% nebulizer solution Commonly known as:  PROVENTIL Take 2.5 mg by nebulization every 6 (six) hours as needed for wheezing or shortness of breath.   PROAIR HFA 108 (90 Base) MCG/ACT inhaler Generic drug:  albuterol Take 2 puffs by mouth every 6 (six) hours as needed for wheezing.   SPIRIVA HANDIHALER 18 MCG inhalation capsule Generic drug:  tiotropium Take 1 Inhaler by mouth daily.   traMADol 50 MG tablet Commonly known as:  ULTRAM Take 50 mg by mouth every 6 (six) hours as needed.         DISCHARGE INSTRUCTIONS:   DIET:  Cardiac diet  DISCHARGE CONDITION:  Stable  ACTIVITY:  Activity as tolerated  OXYGEN:  Home Oxygen: Yes.     Oxygen Delivery: 2 liters/min via Patient connected to nasal cannula oxygen  DISCHARGE LOCATION:  home   If you experience worsening of your admission symptoms, develop shortness of breath, life threatening emergency, suicidal or homicidal thoughts you must seek medical attention immediately by calling 911 or calling your MD immediately  if symptoms less severe.  You Must read complete instructions/literature along with all the possible adverse reactions/side effects for all the Medicines you take and that have been prescribed to you. Take any new Medicines after you have completely understood and accpet all the possible adverse reactions/side effects.   Please note  You were cared for by a hospitalist during your hospital stay. If you have any  questions about your discharge medications or the care you received while you were in the hospital after you are discharged, you can call the unit and asked to speak with the hospitalist on call if the hospitalist that took care of you is not available. Once you are discharged, your primary care physician will handle any further medical issues. Please note that NO REFILLS for any discharge medications will be authorized once you are discharged, as it is imperative that you return to your primary care physician (or establish a relationship with a primary care physician if you do not have one) for your aftercare needs so that they can reassess your need for medications and monitor your lab values.     Today   Feels better, shortness of breath improved.  Wants to go home.   VITAL SIGNS:  Blood pressure (!) 145/58, pulse 86, temperature 97.9 F (36.6 C), temperature source Oral, resp. rate (!) 32, height '5\' 3"'$  (1.6 m), weight 53.8 kg (118 lb 8  oz), SpO2 99 %.  I/O:   Intake/Output Summary (Last 24 hours) at 12/29/16 1439 Last data filed at 12/29/16 0900  Gross per 24 hour  Intake             1120 ml  Output                0 ml  Net             1120 ml    PHYSICAL EXAMINATION:  GENERAL:  76 y.o.-year-old patient lying in the bed with no acute distress.  EYES: Pupils equal, round, reactive to light and accommodation. No scleral icterus. Extraocular muscles intact.  HEENT: Head atraumatic, normocephalic. Oropharynx and nasopharynx clear.  NECK:  Supple, no jugular venous distention. No thyroid enlargement, no tenderness.  LUNGS: Normal breath sounds bilaterally, minimal end-exp wheezing b/l, No rales,rhonchi. No use of accessory muscles of respiration.  CARDIOVASCULAR: S1, S2 normal. No murmurs, rubs, or gallops.  ABDOMEN: Soft, non-tender, non-distended. Bowel sounds present. No organomegaly or mass.  EXTREMITIES: No pedal edema, cyanosis, or clubbing.  NEUROLOGIC: Cranial nerves II through  XII are intact. No focal motor or sensory defecits b/l.  PSYCHIATRIC: The patient is alert and oriented x 3. Good affect.  SKIN: No obvious rash, lesion, or ulcer.   DATA REVIEW:   CBC  Recent Labs Lab 12/29/16 0414  WBC 24.4*  HGB 12.2  HCT 37.0  PLT 189    Chemistries   Recent Labs Lab 12/29/16 0414  NA 135  K 4.1  CL 101  CO2 27  GLUCOSE 110*  BUN 12  CREATININE 0.70  CALCIUM 8.7*    Cardiac Enzymes  Recent Labs Lab 12/28/16 2048  TROPONINI <0.03    Microbiology Results  Results for orders placed or performed during the hospital encounter of 12/28/16  Blood culture (routine x 2)     Status: None (Preliminary result)   Collection Time: 12/29/16 12:19 AM  Result Value Ref Range Status   Specimen Description BLOOD LEFT HAND  Final   Special Requests   Final    BOTTLES DRAWN AEROBIC AND ANAEROBIC AERO5ML,ANAERO12ML   Culture NO GROWTH < 12 HOURS  Final   Report Status PENDING  Incomplete  Blood culture (routine x 2)     Status: None (Preliminary result)   Collection Time: 12/29/16 12:20 AM  Result Value Ref Range Status   Specimen Description BLOOD RIGHT ARM  Final   Special Requests   Final    BOTTLES DRAWN AEROBIC AND ANAEROBIC AERO8ML,ANAERO7ML   Culture NO GROWTH < 12 HOURS  Final   Report Status PENDING  Incomplete    RADIOLOGY:  Dg Chest 2 View  Result Date: 12/28/2016 CLINICAL DATA:  Flu like symptoms. EXAM: CHEST  2 VIEW COMPARISON:  September 07, 2016 FINDINGS: Stable scarring in the apices. The cardiomediastinal silhouette is not significantly changed. No pneumothorax. No acute abnormalities identified. IMPRESSION: Stable chronic changes in the lungs. Electronically Signed   By: Dorise Bullion III M.D   On: 12/28/2016 21:22      Management plans discussed with the patient, family and they are in agreement.  CODE STATUS:  Code Status History    Date Active Date Inactive Code Status Order ID Comments User Context   12/29/2016  1:46 AM  12/29/2016  2:29 PM Full Code [id number]  Lance Coon, MD Inpatient   07/19/2016 10:56 PM 07/20/2016  4:35 PM Full Code [id number]  Harvie Bridge, DO Inpatient   12/06/2015  2:00  AM 12/07/2015  3:34 PM Full Code [id number]  Harrie Foreman, MD Inpatient   12/03/2015 12:49 AM 12/04/2015  5:21 PM Full Code [id number]  Lytle Butte, MD ED   05/12/2015  3:09 PM 05/18/2015 12:39 PM Full Code [id number]  Dustin Flock, MD Inpatient      TOTAL TIME TAKING CARE OF THIS PATIENT: 40 minutes.    Henreitta Leber M.D on 12/29/2016 at 2:39 PM  Between 7am to 6pm - Pager - [phone]  After 6pm go to [email or web address] - Proofreader  [facility]  Office  [phone]  CC: Primary care physician; Adin Hector, MD

## 2016-12-29 NOTE — ED Notes (Signed)
Rocephin still infusing during transport; marked as Handoff on [facility]

## 2016-12-30 LAB — URINE CULTURE: Culture: NO GROWTH

## 2017-01-03 DIAGNOSIS — J40 Bronchitis, not specified as acute or chronic: Secondary | ICD-10-CM | POA: Diagnosis not present

## 2017-01-03 DIAGNOSIS — J438 Other emphysema: Secondary | ICD-10-CM | POA: Diagnosis not present

## 2017-01-03 LAB — CULTURE, BLOOD (ROUTINE X 2)
CULTURE: NO GROWTH
Culture: NO GROWTH

## 2017-01-04 DIAGNOSIS — J449 Chronic obstructive pulmonary disease, unspecified: Secondary | ICD-10-CM | POA: Diagnosis not present

## 2017-01-20 DIAGNOSIS — E538 Deficiency of other specified B group vitamins: Secondary | ICD-10-CM | POA: Diagnosis not present

## 2017-02-01 DIAGNOSIS — J449 Chronic obstructive pulmonary disease, unspecified: Secondary | ICD-10-CM | POA: Diagnosis not present

## 2017-02-25 DIAGNOSIS — E538 Deficiency of other specified B group vitamins: Secondary | ICD-10-CM | POA: Diagnosis not present

## 2017-02-25 DIAGNOSIS — M5136 Other intervertebral disc degeneration, lumbar region: Secondary | ICD-10-CM | POA: Diagnosis not present

## 2017-02-25 DIAGNOSIS — M5416 Radiculopathy, lumbar region: Secondary | ICD-10-CM | POA: Diagnosis not present

## 2017-02-25 DIAGNOSIS — M62838 Other muscle spasm: Secondary | ICD-10-CM | POA: Diagnosis not present

## 2017-02-25 DIAGNOSIS — M5030 Other cervical disc degeneration, unspecified cervical region: Secondary | ICD-10-CM | POA: Diagnosis not present

## 2017-02-25 DIAGNOSIS — M5412 Radiculopathy, cervical region: Secondary | ICD-10-CM | POA: Diagnosis not present

## 2017-03-04 DIAGNOSIS — J449 Chronic obstructive pulmonary disease, unspecified: Secondary | ICD-10-CM | POA: Diagnosis not present

## 2017-03-13 DIAGNOSIS — M810 Age-related osteoporosis without current pathological fracture: Secondary | ICD-10-CM | POA: Diagnosis not present

## 2017-03-13 DIAGNOSIS — M7061 Trochanteric bursitis, right hip: Secondary | ICD-10-CM | POA: Diagnosis not present

## 2017-03-13 DIAGNOSIS — M059 Rheumatoid arthritis with rheumatoid factor, unspecified: Secondary | ICD-10-CM | POA: Diagnosis not present

## 2017-03-26 DIAGNOSIS — R5383 Other fatigue: Secondary | ICD-10-CM | POA: Diagnosis not present

## 2017-03-26 DIAGNOSIS — R634 Abnormal weight loss: Secondary | ICD-10-CM | POA: Diagnosis not present

## 2017-03-26 DIAGNOSIS — E538 Deficiency of other specified B group vitamins: Secondary | ICD-10-CM | POA: Diagnosis not present

## 2017-04-03 DIAGNOSIS — J449 Chronic obstructive pulmonary disease, unspecified: Secondary | ICD-10-CM | POA: Diagnosis not present

## 2017-04-10 ENCOUNTER — Other Ambulatory Visit: Payer: Self-pay | Admitting: Physical Medicine and Rehabilitation

## 2017-04-10 DIAGNOSIS — M5441 Lumbago with sciatica, right side: Secondary | ICD-10-CM

## 2017-04-10 DIAGNOSIS — M5442 Lumbago with sciatica, left side: Principal | ICD-10-CM

## 2017-04-16 DIAGNOSIS — E538 Deficiency of other specified B group vitamins: Secondary | ICD-10-CM | POA: Diagnosis not present

## 2017-04-16 DIAGNOSIS — I10 Essential (primary) hypertension: Secondary | ICD-10-CM | POA: Diagnosis not present

## 2017-04-22 ENCOUNTER — Ambulatory Visit
Admission: RE | Admit: 2017-04-22 | Discharge: 2017-04-22 | Disposition: A | Discharge status: Home / Self Care | Payer: PPO | Source: Ambulatory Visit | Attending: Physical Medicine and Rehabilitation | Admitting: Physical Medicine and Rehabilitation

## 2017-04-22 DIAGNOSIS — M48061 Spinal stenosis, lumbar region without neurogenic claudication: Secondary | ICD-10-CM | POA: Insufficient documentation

## 2017-04-22 DIAGNOSIS — M5416 Radiculopathy, lumbar region: Secondary | ICD-10-CM | POA: Insufficient documentation

## 2017-04-22 DIAGNOSIS — M5126 Other intervertebral disc displacement, lumbar region: Secondary | ICD-10-CM | POA: Diagnosis not present

## 2017-04-22 DIAGNOSIS — M5442 Lumbago with sciatica, left side: Secondary | ICD-10-CM

## 2017-04-22 DIAGNOSIS — M5441 Lumbago with sciatica, right side: Secondary | ICD-10-CM

## 2017-05-04 DIAGNOSIS — J449 Chronic obstructive pulmonary disease, unspecified: Secondary | ICD-10-CM | POA: Diagnosis not present

## 2017-05-08 ENCOUNTER — Other Ambulatory Visit: Payer: Self-pay | Admitting: Specialist

## 2017-05-08 DIAGNOSIS — R0600 Dyspnea, unspecified: Secondary | ICD-10-CM

## 2017-05-08 DIAGNOSIS — R0609 Other forms of dyspnea: Principal | ICD-10-CM

## 2017-05-08 DIAGNOSIS — J438 Other emphysema: Secondary | ICD-10-CM | POA: Diagnosis not present

## 2017-05-08 DIAGNOSIS — E538 Deficiency of other specified B group vitamins: Secondary | ICD-10-CM | POA: Diagnosis not present

## 2017-05-08 DIAGNOSIS — R634 Abnormal weight loss: Secondary | ICD-10-CM | POA: Diagnosis not present

## 2017-05-08 DIAGNOSIS — R911 Solitary pulmonary nodule: Secondary | ICD-10-CM | POA: Diagnosis not present

## 2017-05-13 DIAGNOSIS — M5417 Radiculopathy, lumbosacral region: Secondary | ICD-10-CM | POA: Diagnosis not present

## 2017-05-13 DIAGNOSIS — I119 Hypertensive heart disease without heart failure: Secondary | ICD-10-CM | POA: Diagnosis not present

## 2017-05-13 DIAGNOSIS — E782 Mixed hyperlipidemia: Secondary | ICD-10-CM | POA: Diagnosis not present

## 2017-05-13 DIAGNOSIS — M810 Age-related osteoporosis without current pathological fracture: Secondary | ICD-10-CM | POA: Diagnosis not present

## 2017-05-13 DIAGNOSIS — E279 Disorder of adrenal gland, unspecified: Secondary | ICD-10-CM | POA: Diagnosis not present

## 2017-05-13 DIAGNOSIS — M5136 Other intervertebral disc degeneration, lumbar region: Secondary | ICD-10-CM | POA: Diagnosis not present

## 2017-05-13 DIAGNOSIS — I10 Essential (primary) hypertension: Secondary | ICD-10-CM | POA: Diagnosis not present

## 2017-05-13 DIAGNOSIS — I2510 Atherosclerotic heart disease of native coronary artery without angina pectoris: Secondary | ICD-10-CM | POA: Diagnosis not present

## 2017-05-13 DIAGNOSIS — J9611 Chronic respiratory failure with hypoxia: Secondary | ICD-10-CM | POA: Diagnosis not present

## 2017-05-13 DIAGNOSIS — Z0000 Encounter for general adult medical examination without abnormal findings: Secondary | ICD-10-CM | POA: Diagnosis not present

## 2017-05-13 DIAGNOSIS — E538 Deficiency of other specified B group vitamins: Secondary | ICD-10-CM | POA: Diagnosis not present

## 2017-05-13 DIAGNOSIS — M0579 Rheumatoid arthritis with rheumatoid factor of multiple sites without organ or systems involvement: Secondary | ICD-10-CM | POA: Diagnosis not present

## 2017-05-13 DIAGNOSIS — J438 Other emphysema: Secondary | ICD-10-CM | POA: Diagnosis not present

## 2017-05-16 ENCOUNTER — Emergency Department: Payer: PPO

## 2017-05-16 ENCOUNTER — Encounter: Payer: Self-pay | Admitting: Emergency Medicine

## 2017-05-16 ENCOUNTER — Emergency Department
Admission: EM | Admit: 2017-05-16 | Discharge: 2017-05-16 | Disposition: A | Discharge status: Home / Self Care | Payer: PPO | Attending: Emergency Medicine | Admitting: Emergency Medicine

## 2017-05-16 DIAGNOSIS — Z5321 Procedure and treatment not carried out due to patient leaving prior to being seen by health care provider: Secondary | ICD-10-CM | POA: Insufficient documentation

## 2017-05-16 DIAGNOSIS — R0602 Shortness of breath: Secondary | ICD-10-CM | POA: Diagnosis not present

## 2017-05-16 LAB — BASIC METABOLIC PANEL
ANION GAP: 8 (ref 5–15)
BUN: 10 mg/dL (ref 6–20)
CHLORIDE: 96 mmol/L — AB (ref 101–111)
CO2: 30 mmol/L (ref 22–32)
Calcium: 9.6 mg/dL (ref 8.9–10.3)
Creatinine, Ser: 0.56 mg/dL (ref 0.44–1.00)
GFR calc non Af Amer: >60 mL/min (ref >60)
Glucose, Bld: 126 mg/dL — ABNORMAL HIGH (ref 65–99)
POTASSIUM: 3.9 mmol/L (ref 3.5–5.1)
SODIUM: 134 mmol/L — AB (ref 135–145)

## 2017-05-16 LAB — CBC
HCT: 39.6 % (ref 35.0–47.0)
HEMOGLOBIN: 13.6 g/dL (ref 12.0–16.0)
MCH: 28.7 pg (ref 26.0–34.0)
MCHC: 34.3 g/dL (ref 32.0–36.0)
MCV: 83.8 fL (ref 80.0–100.0)
PLATELETS: 266 10*3/uL (ref 150–440)
RBC: 4.73 MIL/uL (ref 3.80–5.20)
RDW: 14.1 % (ref 11.5–14.5)
WBC: 11.8 10*3/uL — AB (ref 3.6–11.0)

## 2017-05-16 LAB — TROPONIN I: Troponin I: <0.03 ng/mL (ref <0.03)

## 2017-05-16 NOTE — ED Triage Notes (Signed)
Pt to ED via POV, pt states that she has not been feeling well today. Pt states that she feels like her heart is racing and that her blood pressure is elevated. Pt also states that she feels more short of breath than normal. Pt on 2 liter of O2 via Portal chronically. Pt appears pale.

## 2017-05-16 NOTE — ED Notes (Signed)
Dr. Archie Balboa signed the EKG.

## 2017-05-19 ENCOUNTER — Telehealth: Payer: Self-pay | Admitting: Emergency Medicine

## 2017-05-19 NOTE — Telephone Encounter (Signed)
Called patient due to lwot to inquire about condition and follow up plans. She is okay. Plans to see her pcp tomorrow and will tell them she has labs done here.

## 2017-05-30 DIAGNOSIS — J019 Acute sinusitis, unspecified: Secondary | ICD-10-CM | POA: Diagnosis not present

## 2017-05-30 DIAGNOSIS — W57XXXA Bitten or stung by nonvenomous insect and other nonvenomous arthropods, initial encounter: Secondary | ICD-10-CM | POA: Diagnosis not present

## 2017-06-03 DIAGNOSIS — J449 Chronic obstructive pulmonary disease, unspecified: Secondary | ICD-10-CM | POA: Diagnosis not present

## 2017-06-09 ENCOUNTER — Ambulatory Visit: Payer: PPO | Attending: Specialist

## 2017-06-10 DIAGNOSIS — Z1211 Encounter for screening for malignant neoplasm of colon: Secondary | ICD-10-CM | POA: Diagnosis not present

## 2017-06-16 DIAGNOSIS — R911 Solitary pulmonary nodule: Secondary | ICD-10-CM | POA: Diagnosis not present

## 2017-06-16 DIAGNOSIS — J438 Other emphysema: Secondary | ICD-10-CM | POA: Diagnosis not present

## 2017-06-16 DIAGNOSIS — E538 Deficiency of other specified B group vitamins: Secondary | ICD-10-CM | POA: Diagnosis not present

## 2017-06-16 DIAGNOSIS — M0579 Rheumatoid arthritis with rheumatoid factor of multiple sites without organ or systems involvement: Secondary | ICD-10-CM | POA: Diagnosis not present

## 2017-06-23 ENCOUNTER — Other Ambulatory Visit: Payer: Self-pay | Admitting: Physician Assistant

## 2017-06-23 DIAGNOSIS — M5417 Radiculopathy, lumbosacral region: Secondary | ICD-10-CM | POA: Diagnosis not present

## 2017-06-23 DIAGNOSIS — R509 Fever, unspecified: Secondary | ICD-10-CM | POA: Diagnosis not present

## 2017-06-23 DIAGNOSIS — R0602 Shortness of breath: Secondary | ICD-10-CM | POA: Diagnosis not present

## 2017-06-23 DIAGNOSIS — R634 Abnormal weight loss: Secondary | ICD-10-CM | POA: Diagnosis not present

## 2017-06-23 DIAGNOSIS — J438 Other emphysema: Secondary | ICD-10-CM | POA: Diagnosis not present

## 2017-06-23 DIAGNOSIS — R911 Solitary pulmonary nodule: Secondary | ICD-10-CM | POA: Diagnosis not present

## 2017-06-24 ENCOUNTER — Ambulatory Visit
Admission: RE | Admit: 2017-06-24 | Discharge: 2017-06-24 | Disposition: A | Discharge status: Home / Self Care | Payer: PPO | Source: Ambulatory Visit | Attending: Physician Assistant | Admitting: Physician Assistant

## 2017-06-24 DIAGNOSIS — J479 Bronchiectasis, uncomplicated: Secondary | ICD-10-CM | POA: Insufficient documentation

## 2017-06-24 DIAGNOSIS — R911 Solitary pulmonary nodule: Secondary | ICD-10-CM | POA: Diagnosis present

## 2017-06-24 DIAGNOSIS — E279 Disorder of adrenal gland, unspecified: Secondary | ICD-10-CM | POA: Insufficient documentation

## 2017-06-24 DIAGNOSIS — R918 Other nonspecific abnormal finding of lung field: Secondary | ICD-10-CM | POA: Insufficient documentation

## 2017-06-25 ENCOUNTER — Other Ambulatory Visit: Payer: Self-pay | Admitting: Specialist

## 2017-06-25 DIAGNOSIS — J439 Emphysema, unspecified: Secondary | ICD-10-CM | POA: Diagnosis not present

## 2017-06-25 DIAGNOSIS — R911 Solitary pulmonary nodule: Secondary | ICD-10-CM | POA: Diagnosis not present

## 2017-06-25 DIAGNOSIS — R05 Cough: Secondary | ICD-10-CM | POA: Diagnosis not present

## 2017-06-25 DIAGNOSIS — R0609 Other forms of dyspnea: Secondary | ICD-10-CM | POA: Diagnosis not present

## 2017-07-01 ENCOUNTER — Ambulatory Visit: Payer: PPO

## 2017-07-04 ENCOUNTER — Ambulatory Visit: Payer: PPO

## 2017-07-04 DIAGNOSIS — J449 Chronic obstructive pulmonary disease, unspecified: Secondary | ICD-10-CM | POA: Diagnosis not present

## 2017-07-08 ENCOUNTER — Ambulatory Visit: Payer: PPO

## 2017-07-11 DIAGNOSIS — M5416 Radiculopathy, lumbar region: Secondary | ICD-10-CM | POA: Diagnosis not present

## 2017-07-11 DIAGNOSIS — M5412 Radiculopathy, cervical region: Secondary | ICD-10-CM | POA: Diagnosis not present

## 2017-07-11 DIAGNOSIS — M5030 Other cervical disc degeneration, unspecified cervical region: Secondary | ICD-10-CM | POA: Diagnosis not present

## 2017-07-11 DIAGNOSIS — M5136 Other intervertebral disc degeneration, lumbar region: Secondary | ICD-10-CM | POA: Diagnosis not present

## 2017-07-17 ENCOUNTER — Ambulatory Visit
Admission: RE | Admit: 2017-07-17 | Discharge: 2017-07-17 | Disposition: A | Discharge status: Home / Self Care | Payer: PPO | Source: Ambulatory Visit | Attending: Specialist | Admitting: Specialist

## 2017-07-17 DIAGNOSIS — J438 Other emphysema: Secondary | ICD-10-CM | POA: Insufficient documentation

## 2017-07-17 DIAGNOSIS — J984 Other disorders of lung: Secondary | ICD-10-CM | POA: Insufficient documentation

## 2017-07-17 DIAGNOSIS — J479 Bronchiectasis, uncomplicated: Secondary | ICD-10-CM | POA: Diagnosis not present

## 2017-07-17 DIAGNOSIS — R918 Other nonspecific abnormal finding of lung field: Secondary | ICD-10-CM | POA: Diagnosis not present

## 2017-07-17 DIAGNOSIS — R911 Solitary pulmonary nodule: Secondary | ICD-10-CM | POA: Diagnosis not present

## 2017-07-17 DIAGNOSIS — E538 Deficiency of other specified B group vitamins: Secondary | ICD-10-CM | POA: Diagnosis not present

## 2017-07-17 LAB — GLUCOSE, CAPILLARY: Glucose-Capillary: 76 mg/dL (ref 65–99)

## 2017-07-17 MED ORDER — FLUDEOXYGLUCOSE F - 18 (FDG) INJECTION
12.7100 | Freq: Once | INTRAVENOUS | Status: AC | PRN
  Administered 2017-07-17: 12.71 via INTRAVENOUS

## 2017-08-04 DIAGNOSIS — J449 Chronic obstructive pulmonary disease, unspecified: Secondary | ICD-10-CM | POA: Diagnosis not present

## 2017-08-12 DIAGNOSIS — Z23 Encounter for immunization: Secondary | ICD-10-CM | POA: Diagnosis not present

## 2017-08-12 DIAGNOSIS — I2510 Atherosclerotic heart disease of native coronary artery without angina pectoris: Secondary | ICD-10-CM | POA: Diagnosis not present

## 2017-08-12 DIAGNOSIS — M5136 Other intervertebral disc degeneration, lumbar region: Secondary | ICD-10-CM | POA: Diagnosis not present

## 2017-08-12 DIAGNOSIS — J439 Emphysema, unspecified: Secondary | ICD-10-CM | POA: Diagnosis not present

## 2017-08-12 DIAGNOSIS — I119 Hypertensive heart disease without heart failure: Secondary | ICD-10-CM | POA: Diagnosis not present

## 2017-08-12 DIAGNOSIS — J9611 Chronic respiratory failure with hypoxia: Secondary | ICD-10-CM | POA: Diagnosis not present

## 2017-08-12 DIAGNOSIS — E538 Deficiency of other specified B group vitamins: Secondary | ICD-10-CM | POA: Diagnosis not present

## 2017-08-12 DIAGNOSIS — R911 Solitary pulmonary nodule: Secondary | ICD-10-CM | POA: Diagnosis not present

## 2017-08-12 DIAGNOSIS — M5417 Radiculopathy, lumbosacral region: Secondary | ICD-10-CM | POA: Diagnosis not present

## 2017-08-12 DIAGNOSIS — M0579 Rheumatoid arthritis with rheumatoid factor of multiple sites without organ or systems involvement: Secondary | ICD-10-CM | POA: Diagnosis not present

## 2017-08-12 DIAGNOSIS — M810 Age-related osteoporosis without current pathological fracture: Secondary | ICD-10-CM | POA: Diagnosis not present

## 2017-08-12 DIAGNOSIS — E279 Disorder of adrenal gland, unspecified: Secondary | ICD-10-CM | POA: Diagnosis not present

## 2017-08-12 DIAGNOSIS — I10 Essential (primary) hypertension: Secondary | ICD-10-CM | POA: Diagnosis not present

## 2017-08-12 DIAGNOSIS — E782 Mixed hyperlipidemia: Secondary | ICD-10-CM | POA: Diagnosis not present

## 2017-08-13 ENCOUNTER — Other Ambulatory Visit: Payer: Self-pay | Admitting: Specialist

## 2017-08-13 DIAGNOSIS — R911 Solitary pulmonary nodule: Secondary | ICD-10-CM

## 2017-08-18 DIAGNOSIS — E538 Deficiency of other specified B group vitamins: Secondary | ICD-10-CM | POA: Diagnosis not present

## 2017-09-03 DIAGNOSIS — J449 Chronic obstructive pulmonary disease, unspecified: Secondary | ICD-10-CM | POA: Diagnosis not present

## 2017-09-09 DIAGNOSIS — M810 Age-related osteoporosis without current pathological fracture: Secondary | ICD-10-CM | POA: Diagnosis not present

## 2017-09-09 DIAGNOSIS — M059 Rheumatoid arthritis with rheumatoid factor, unspecified: Secondary | ICD-10-CM | POA: Diagnosis not present

## 2017-09-19 DIAGNOSIS — E538 Deficiency of other specified B group vitamins: Secondary | ICD-10-CM | POA: Diagnosis not present

## 2017-09-23 ENCOUNTER — Emergency Department
Admission: EM | Admit: 2017-09-23 | Discharge: 2017-09-23 | Disposition: A | Discharge status: Home / Self Care | Payer: PPO | Attending: Student in an Organized Health Care Education/Training Program | Admitting: Student in an Organized Health Care Education/Training Program

## 2017-09-23 ENCOUNTER — Encounter: Payer: Self-pay | Admitting: Emergency Medicine

## 2017-09-23 ENCOUNTER — Emergency Department: Payer: PPO

## 2017-09-23 DIAGNOSIS — R1011 Right upper quadrant pain: Secondary | ICD-10-CM | POA: Diagnosis not present

## 2017-09-23 DIAGNOSIS — I509 Heart failure, unspecified: Secondary | ICD-10-CM | POA: Insufficient documentation

## 2017-09-23 DIAGNOSIS — Z853 Personal history of malignant neoplasm of breast: Secondary | ICD-10-CM | POA: Insufficient documentation

## 2017-09-23 DIAGNOSIS — F17210 Nicotine dependence, cigarettes, uncomplicated: Secondary | ICD-10-CM | POA: Insufficient documentation

## 2017-09-23 DIAGNOSIS — I110 Hypertensive heart disease with heart failure: Secondary | ICD-10-CM | POA: Diagnosis not present

## 2017-09-23 DIAGNOSIS — Z85038 Personal history of other malignant neoplasm of large intestine: Secondary | ICD-10-CM | POA: Insufficient documentation

## 2017-09-23 DIAGNOSIS — Z79899 Other long term (current) drug therapy: Secondary | ICD-10-CM | POA: Insufficient documentation

## 2017-09-23 DIAGNOSIS — Z7982 Long term (current) use of aspirin: Secondary | ICD-10-CM | POA: Insufficient documentation

## 2017-09-23 DIAGNOSIS — R109 Unspecified abdominal pain: Secondary | ICD-10-CM | POA: Diagnosis not present

## 2017-09-23 DIAGNOSIS — J449 Chronic obstructive pulmonary disease, unspecified: Secondary | ICD-10-CM | POA: Diagnosis not present

## 2017-09-23 LAB — COMPREHENSIVE METABOLIC PANEL
ALBUMIN: 4.4 g/dL (ref 3.5–5.0)
ALK PHOS: 57 U/L (ref 38–126)
ALT: 13 U/L — AB (ref 14–54)
ANION GAP: 8 (ref 5–15)
AST: 22 U/L (ref 15–41)
BILIRUBIN TOTAL: 1 mg/dL (ref 0.3–1.2)
BUN: 10 mg/dL (ref 6–20)
CALCIUM: 9.6 mg/dL (ref 8.9–10.3)
CO2: 32 mmol/L (ref 22–32)
CREATININE: 0.67 mg/dL (ref 0.44–1.00)
Chloride: 95 mmol/L — ABNORMAL LOW (ref 101–111)
GFR calc Af Amer: >60 mL/min (ref >60)
GFR calc non Af Amer: >60 mL/min (ref >60)
GLUCOSE: 119 mg/dL — AB (ref 65–99)
Potassium: 4.3 mmol/L (ref 3.5–5.1)
SODIUM: 135 mmol/L (ref 135–145)
TOTAL PROTEIN: 7.3 g/dL (ref 6.5–8.1)

## 2017-09-23 LAB — CBC
HCT: 41.2 % (ref 35.0–47.0)
Hemoglobin: 13.8 g/dL (ref 12.0–16.0)
MCH: 28.2 pg (ref 26.0–34.0)
MCHC: 33.5 g/dL (ref 32.0–36.0)
MCV: 84.4 fL (ref 80.0–100.0)
PLATELETS: 274 10*3/uL (ref 150–440)
RBC: 4.89 MIL/uL (ref 3.80–5.20)
RDW: 13.8 % (ref 11.5–14.5)
WBC: 9.1 10*3/uL (ref 3.6–11.0)

## 2017-09-23 LAB — TROPONIN I

## 2017-09-23 LAB — LIPASE, BLOOD: Lipase: 28 U/L (ref 11–51)

## 2017-09-23 MED ORDER — FENTANYL CITRATE (PF) 100 MCG/2ML IJ SOLN: 25.0000 ug | INTRAMUSCULAR | Status: DC | PRN

## 2017-09-23 MED ORDER — LIDOCAINE 5 % EX PTCH: 1.0000 | MEDICATED_PATCH | Freq: Two times a day (BID) | CUTANEOUS | 0 refills | Status: DC

## 2017-09-23 MED ORDER — SODIUM CHLORIDE 0.9 % IV BOLUS (SEPSIS)
1000.0000 mL | Freq: Once | INTRAVENOUS | Status: AC
  Administered 2017-09-23: 1000 mL via INTRAVENOUS

## 2017-09-23 MED ORDER — PANTOPRAZOLE SODIUM 20 MG PO TBEC: 20.0000 mg | DELAYED_RELEASE_TABLET | Freq: Two times a day (BID) | ORAL | 0 refills | Status: DC

## 2017-09-23 MED ORDER — POLYETHYLENE GLYCOL 3350 17 G PO PACK: 17.0000 g | PACK | Freq: Every day | ORAL | 0 refills | Status: DC

## 2017-09-23 MED ORDER — IOPAMIDOL (ISOVUE-300) INJECTION 61%
75.0000 mL | Freq: Once | INTRAVENOUS | Status: AC | PRN
  Administered 2017-09-23: 75 mL via INTRAVENOUS
  Filled 2017-09-23: qty 75

## 2017-09-23 NOTE — ED Notes (Signed)
Pt brought over from [place] [place] via RN in wheelchair, NAD noted at this time.

## 2017-09-23 NOTE — ED Triage Notes (Signed)
Patient presents to ED via POV from home with c/o abdominal pain x 1 week. Patient denies N/V/D. Patient reports right side pain.

## 2017-09-23 NOTE — ED Notes (Signed)
Pt reports right sided pain with indigestion off an on for the last week - denies N/V - denies diarrhea - denies chest pain - reports shortness of breath with ambulation but pt does have dx of COPD

## 2017-09-23 NOTE — ED Provider Notes (Signed)
[facility] Emergency Department Provider Note    First MD Initiated Contact with Patient 09/23/17 1428     (approximate)  I have reviewed the triage vital signs and the nursing notes.   HISTORY  Chief Complaint Abdominal Pain    HPI Robin Hudson is a 76 y.o. female presents with chief complaint of 1 week of intermittent nausea and indigestion with progressively worsening right upper quadrant pain radiating to her right shoulder.  Denies any shortness of breath or chest pains.  Patient just lost her son to a battle with cancer and thought that it is been related to stress so she put off calling her doctor.  She feels that she has been having a low-grade fever but no measured temperatures.  Denies any diarrhea.  No melena or hematochezia.  No cough.  No lower extremity swelling.  No pain when taking a deep inspiration.  Taking Tums and Protonix without any change in her symptoms.  Past Medical History:  Diagnosis Date  . Allergic rhinitis   . Anxiety   . Asthma   . B12 deficiency   . Breast cancer ([place]) 1999   RT LUMPECTOMY  . Carcinoma of right breast treated with adjuvant chemotherapy ([place]) 1999   RT LUMPECTOMY  . CHF (congestive heart failure) ([place])   . Colon cancer ([place]) 2010  . Colon cancer ([place])   . COPD (chronic obstructive pulmonary disease) ([place])   . Hyperlipemia   . Hypertension   . MRSA pneumonia ([place])   . Osteoporosis   . Radiation 1999   BREAST CA  . Right adrenal mass [facility])    Family History  Problem Relation Age of Onset  . Heart attack Mother   . Breast cancer Other   . Breast cancer Sister 66  . Breast cancer Sister 78   Past Surgical History:  Procedure Laterality Date  . ABDOMINAL HYSTERECTOMY    . APPENDECTOMY    . BREAST SURGERY Right    lumpectomy x2 with lymph nodes  . colectomy     partial  . IMAGE GUIDED SINUS SURGERY     Patient Active Problem List   Diagnosis Date Noted  . Leukocytosis 12/29/2016  .  Cough 12/29/2016  . Hypoxia 12/06/2015  . COPD exacerbation ([place]) 12/03/2015  . Elevated troponin 12/03/2015  . Elevated antinuclear antibody (ANA) level 05/15/2015  . Shortness of breath 05/12/2015  . Hyperlipemia 05/12/2015  . COPD (chronic obstructive pulmonary disease) ([place]) 05/12/2015  . HTN (hypertension) 05/12/2015  . Osteoporosis 05/12/2015  . Right adrenal mass ([place]) 05/12/2015  . B12 deficiency 05/12/2015  . Allergic rhinitis 05/12/2015  . Breast cancer ([place]) 05/12/2015  . Colon cancer ([place]) 05/12/2015  . Closed head injury 12/15/2014      Prior to Admission medications   Medication Sig Start Date End Date Taking? Authorizing Provider  albuterol (PROVENTIL) (2.5 MG/3ML) 0.083% nebulizer solution Take 2.5 mg by nebulization every 6 (six) hours as needed for wheezing or shortness of breath.    [provider]  ALPRAZolam Duanne Moron) 0.5 MG tablet Take 1 tablet by mouth 2 (two) times daily as needed for anxiety.  04/19/15   [provider]  aspirin EC 81 MG tablet Take 1 tablet (81 mg total) by mouth daily. 06/28/16   Fritzi Mandes, MD  atenolol (TENORMIN) 50 MG tablet Take 1 tablet (50 mg total) by mouth daily. Patient taking differently: Take 25 mg by mouth daily.  05/18/15   Tama High III,  MD  azelastine (ASTELIN) 0.1 % nasal spray Place 1 spray into both nostrils 2 (two) times daily. Use in each nostril as directed    [provider]  cholecalciferol (VITAMIN D) 1000 units tablet Take 1,000 Units by mouth daily.    [provider]  escitalopram (LEXAPRO) 10 MG tablet Take 1 tablet by mouth daily. 04/12/15   [provider]  HYDROcodone-acetaminophen (NORCO) 10-325 MG per tablet Take 0.5-1 tablets by mouth 3 (three) times daily as needed for moderate pain or severe pain.  04/28/15   [provider]  hydroxychloroquine (PLAQUENIL) 200 MG tablet Take 400 mg by mouth daily. Reported on 12/06/2015    [provider]  losartan  (COZAAR) 50 MG tablet Take 1 tablet (50 mg total) by mouth daily. 05/18/15   Adin Hector, MD  meclizine (ANTIVERT) 12.5 MG tablet Take 1 tablet (12.5 mg total) by mouth 3 (three) times daily as needed for dizziness. 09/08/16   Merlyn Lot, MD  montelukast (SINGULAIR) 10 MG tablet Take 10 mg by mouth at bedtime.    [provider]  PROAIR HFA 108 (90 BASE) MCG/ACT inhaler Take 2 puffs by mouth every 6 (six) hours as needed for wheezing.  04/12/15   [provider]  SPIRIVA HANDIHALER 18 MCG inhalation capsule Take 1 Inhaler by mouth daily. 04/24/15   [provider]  traMADol (ULTRAM) 50 MG tablet Take 50 mg by mouth every 6 (six) hours as needed.    [provider]    Allergies Coreg [carvedilol]    Social History Social History   Tobacco Use  . Smoking status: Current Some Day Smoker    Packs/day: 0.25    Types: Cigarettes  . Smokeless tobacco: Never Used  Substance Use Topics  . Alcohol use: No  . Drug use: No    Review of Systems Patient denies headaches, rhinorrhea, blurry vision, numbness, shortness of breath, chest pain, edema, cough, abdominal pain, nausea, vomiting, diarrhea, dysuria, fevers, rashes or hallucinations unless otherwise stated above in HPI. ____________________________________________   PHYSICAL EXAM:  VITAL SIGNS: Vitals:   09/23/17 1211  BP: (!) 166/82  Pulse: 83  Resp: 16  Temp: 98.1 F (36.7 C)  SpO2: 94%    Constitutional: Alert and oriented. Elderly and frail; appearing and in no acute distress. Eyes: Conjunctivae are normal.  Head: Atraumatic. Nose: No congestion/rhinnorhea. Mouth/Throat: Mucous membranes are moist.   Neck: No stridor. Painless ROM.  Cardiovascular: Normal rate, regular rhythm. Grossly normal heart sounds.  Good peripheral circulation. Respiratory: Normal respiratory effort.  No retractions. Lungs CTAB. Gastrointestinal: Soft but with +++ TTP of RUQ. No rebound ttp. No masses.  No distention. No abdominal bruits. No CVA tenderness. Musculoskeletal: No lower extremity tenderness nor edema.  No joint effusions. Neurologic:  Normal speech and language. No gross focal neurologic deficits are appreciated. No facial droop Skin:  Skin is warm, dry and intact. No rash noted. Psychiatric: Mood and affect are normal. Speech and behavior are normal.  ____________________________________________   LABS (all labs ordered are listed, but only abnormal results are displayed)  Results for orders placed or performed during the hospital encounter of 09/23/17 (from the past 24 hour(s))  Lipase, blood     Status: None   Collection Time: 09/23/17 12:07 PM  Result Value Ref Range   Lipase 28 11 - 51 U/L  Comprehensive metabolic panel     Status: Abnormal   Collection Time: 09/23/17 12:07 PM  Result Value Ref Range  Sodium 135 135 - 145 mmol/L   Potassium 4.3 3.5 - 5.1 mmol/L   Chloride 95 (L) 101 - 111 mmol/L   CO2 32 22 - 32 mmol/L   Glucose, Bld 119 (H) 65 - 99 mg/dL   BUN 10 6 - 20 mg/dL   Creatinine, Ser 0.67 0.44 - 1.00 mg/dL   Calcium 9.6 8.9 - 10.3 mg/dL   Total Protein 7.3 6.5 - 8.1 g/dL   Albumin 4.4 3.5 - 5.0 g/dL   AST 22 15 - 41 U/L   ALT 13 (L) 14 - 54 U/L   Alkaline Phosphatase 57 38 - 126 U/L   Total Bilirubin 1.0 0.3 - 1.2 mg/dL   GFR calc non Af Amer >60 >60 mL/min   GFR calc Af Amer >60 >60 mL/min   Anion gap 8 5 - 15  CBC     Status: None   Collection Time: 09/23/17 12:07 PM  Result Value Ref Range   WBC 9.1 3.6 - 11.0 K/uL   RBC 4.89 3.80 - 5.20 MIL/uL   Hemoglobin 13.8 12.0 - 16.0 g/dL   HCT 41.2 35.0 - 47.0 %   MCV 84.4 80.0 - 100.0 fL   MCH 28.2 26.0 - 34.0 pg   MCHC 33.5 32.0 - 36.0 g/dL   RDW 13.8 11.5 - 14.5 %   Platelets 274 150 - 440 K/uL   ____________________________________________  EKG My review and personal interpretation at Time: 15:49   Indication: abdominal pain  Rate: 70  Rhythm: sinus Axis: normal Other: no stemi,  normal intervals, non specific st changes consistent with previous EKG ____________________________________________  RADIOLOGY  I personally reviewed all radiographic images ordered to evaluate for the above acute complaints and reviewed radiology reports and findings.  These findings were personally discussed with the patient.  Please see medical record for radiology report.  ____________________________________________   PROCEDURES  Procedure(s) performed:  Procedures    Critical Care performed: no ____________________________________________   INITIAL IMPRESSION / ASSESSMENT AND PLAN / ED COURSE  Pertinent labs & imaging results that were available during my care of the patient were reviewed by me and considered in my medical decision making (see chart for details).  DDX: cholelithiasis, cholecystitis, malignancy, diverticulitis, constipation  MADA SADIK is a 76 y.o. who presents to the ED with indigestion and abdominal pain as described.  Blood work and CT imaging ordered for the above differential.  Blood work is reassuring.  She has no evidence of active GI bleed symptoms.  CT imaging shows no evidence of cholelithiasis or cholecystitis.  There is some area of probable gastritis some possible duodenitis but the patient is tolerating oral hydration.  No evidence of ACS.  She is hemodynamically stable and in no acute distress with repeat abdominal exam being soft and benign I do believe that the patient is appropriate for trial of outpatient management with oral Protonix, MiraLAX for constipation and told to stop taking any NSAIDs.  Patient has seen outpatient GI in the past and agrees to follow-up with them. we discussed strict return precautions.  Have discussed with the patient and available family all diagnostics and treatments performed thus far and all questions were answered to the best of my ability. The patient demonstrates understanding and agreement with plan.        ____________________________________________   FINAL CLINICAL IMPRESSION(S) / ED DIAGNOSES  Final diagnoses:  Right upper quadrant abdominal pain  RUQ abdominal pain      NEW MEDICATIONS STARTED DURING  THIS VISIT:  This SmartLink is deprecated. Use AVSMEDLIST instead to display the medication list for a patient.   Note:  This document was prepared using Dragon voice recognition software and may include unintentional dictation errors.    Merlyn Lot, MD 09/23/17 [phone]

## 2017-09-23 NOTE — Discharge Instructions (Signed)

## 2017-09-30 DIAGNOSIS — Z09 Encounter for follow-up examination after completed treatment for conditions other than malignant neoplasm: Secondary | ICD-10-CM | POA: Diagnosis not present

## 2017-09-30 DIAGNOSIS — B0223 Postherpetic polyneuropathy: Secondary | ICD-10-CM | POA: Diagnosis not present

## 2017-10-04 DIAGNOSIS — J449 Chronic obstructive pulmonary disease, unspecified: Secondary | ICD-10-CM | POA: Diagnosis not present

## 2017-10-17 DIAGNOSIS — R911 Solitary pulmonary nodule: Secondary | ICD-10-CM | POA: Diagnosis not present

## 2017-10-17 DIAGNOSIS — M5412 Radiculopathy, cervical region: Secondary | ICD-10-CM | POA: Diagnosis not present

## 2017-10-17 DIAGNOSIS — I10 Essential (primary) hypertension: Secondary | ICD-10-CM | POA: Diagnosis not present

## 2017-10-17 DIAGNOSIS — J439 Emphysema, unspecified: Secondary | ICD-10-CM | POA: Diagnosis not present

## 2017-10-17 DIAGNOSIS — R1011 Right upper quadrant pain: Secondary | ICD-10-CM | POA: Diagnosis not present

## 2017-10-17 DIAGNOSIS — M5136 Other intervertebral disc degeneration, lumbar region: Secondary | ICD-10-CM | POA: Diagnosis not present

## 2017-10-17 DIAGNOSIS — M5030 Other cervical disc degeneration, unspecified cervical region: Secondary | ICD-10-CM | POA: Diagnosis not present

## 2017-10-17 DIAGNOSIS — M5416 Radiculopathy, lumbar region: Secondary | ICD-10-CM | POA: Diagnosis not present

## 2017-10-17 DIAGNOSIS — J9611 Chronic respiratory failure with hypoxia: Secondary | ICD-10-CM | POA: Diagnosis not present

## 2017-10-23 DIAGNOSIS — E538 Deficiency of other specified B group vitamins: Secondary | ICD-10-CM | POA: Diagnosis not present

## 2017-10-23 DIAGNOSIS — R918 Other nonspecific abnormal finding of lung field: Secondary | ICD-10-CM | POA: Diagnosis not present

## 2017-10-23 DIAGNOSIS — J449 Chronic obstructive pulmonary disease, unspecified: Secondary | ICD-10-CM | POA: Diagnosis not present

## 2017-10-23 DIAGNOSIS — R0609 Other forms of dyspnea: Secondary | ICD-10-CM | POA: Diagnosis not present

## 2017-10-23 DIAGNOSIS — J479 Bronchiectasis, uncomplicated: Secondary | ICD-10-CM | POA: Diagnosis not present

## 2017-10-30 ENCOUNTER — Ambulatory Visit
Admission: RE | Admit: 2017-10-30 | Discharge: 2017-10-30 | Disposition: A | Discharge status: Home / Self Care | Payer: PPO | Source: Ambulatory Visit | Attending: Specialist | Admitting: Specialist

## 2017-10-30 DIAGNOSIS — E782 Mixed hyperlipidemia: Secondary | ICD-10-CM | POA: Diagnosis not present

## 2017-10-30 DIAGNOSIS — J850 Gangrene and necrosis of lung: Secondary | ICD-10-CM | POA: Diagnosis not present

## 2017-10-30 DIAGNOSIS — R0609 Other forms of dyspnea: Secondary | ICD-10-CM

## 2017-10-30 DIAGNOSIS — J439 Emphysema, unspecified: Secondary | ICD-10-CM | POA: Diagnosis not present

## 2017-10-30 DIAGNOSIS — I700 Atherosclerosis of aorta: Secondary | ICD-10-CM | POA: Diagnosis not present

## 2017-10-30 DIAGNOSIS — R911 Solitary pulmonary nodule: Secondary | ICD-10-CM | POA: Diagnosis not present

## 2017-10-30 DIAGNOSIS — I119 Hypertensive heart disease without heart failure: Secondary | ICD-10-CM | POA: Diagnosis not present

## 2017-10-30 DIAGNOSIS — I2510 Atherosclerotic heart disease of native coronary artery without angina pectoris: Secondary | ICD-10-CM | POA: Insufficient documentation

## 2017-10-30 DIAGNOSIS — E538 Deficiency of other specified B group vitamins: Secondary | ICD-10-CM | POA: Diagnosis not present

## 2017-10-30 DIAGNOSIS — R918 Other nonspecific abnormal finding of lung field: Secondary | ICD-10-CM | POA: Insufficient documentation

## 2017-10-30 DIAGNOSIS — M47816 Spondylosis without myelopathy or radiculopathy, lumbar region: Secondary | ICD-10-CM | POA: Diagnosis not present

## 2017-10-30 DIAGNOSIS — M810 Age-related osteoporosis without current pathological fracture: Secondary | ICD-10-CM | POA: Diagnosis not present

## 2017-10-30 DIAGNOSIS — I10 Essential (primary) hypertension: Secondary | ICD-10-CM | POA: Diagnosis not present

## 2017-10-30 DIAGNOSIS — M5136 Other intervertebral disc degeneration, lumbar region: Secondary | ICD-10-CM | POA: Diagnosis not present

## 2017-10-30 DIAGNOSIS — E279 Disorder of adrenal gland, unspecified: Secondary | ICD-10-CM | POA: Diagnosis not present

## 2017-10-30 DIAGNOSIS — J9611 Chronic respiratory failure with hypoxia: Secondary | ICD-10-CM | POA: Diagnosis not present

## 2017-10-30 DIAGNOSIS — M0579 Rheumatoid arthritis with rheumatoid factor of multiple sites without organ or systems involvement: Secondary | ICD-10-CM | POA: Diagnosis not present

## 2017-11-03 DIAGNOSIS — J449 Chronic obstructive pulmonary disease, unspecified: Secondary | ICD-10-CM | POA: Diagnosis not present

## 2017-11-20 DIAGNOSIS — E782 Mixed hyperlipidemia: Secondary | ICD-10-CM | POA: Diagnosis not present

## 2017-11-20 DIAGNOSIS — I10 Essential (primary) hypertension: Secondary | ICD-10-CM | POA: Diagnosis not present

## 2017-11-20 DIAGNOSIS — E538 Deficiency of other specified B group vitamins: Secondary | ICD-10-CM | POA: Diagnosis not present

## 2017-11-26 DIAGNOSIS — E279 Disorder of adrenal gland, unspecified: Secondary | ICD-10-CM | POA: Diagnosis not present

## 2017-11-26 DIAGNOSIS — M0579 Rheumatoid arthritis with rheumatoid factor of multiple sites without organ or systems involvement: Secondary | ICD-10-CM | POA: Diagnosis not present

## 2017-11-26 DIAGNOSIS — J9611 Chronic respiratory failure with hypoxia: Secondary | ICD-10-CM | POA: Diagnosis not present

## 2017-11-26 DIAGNOSIS — E538 Deficiency of other specified B group vitamins: Secondary | ICD-10-CM | POA: Diagnosis not present

## 2017-11-26 DIAGNOSIS — I2510 Atherosclerotic heart disease of native coronary artery without angina pectoris: Secondary | ICD-10-CM | POA: Diagnosis not present

## 2017-11-26 DIAGNOSIS — M5136 Other intervertebral disc degeneration, lumbar region: Secondary | ICD-10-CM | POA: Diagnosis not present

## 2017-11-26 DIAGNOSIS — R911 Solitary pulmonary nodule: Secondary | ICD-10-CM | POA: Diagnosis not present

## 2017-11-26 DIAGNOSIS — I119 Hypertensive heart disease without heart failure: Secondary | ICD-10-CM | POA: Diagnosis not present

## 2017-11-26 DIAGNOSIS — M810 Age-related osteoporosis without current pathological fracture: Secondary | ICD-10-CM | POA: Diagnosis not present

## 2017-11-26 DIAGNOSIS — J439 Emphysema, unspecified: Secondary | ICD-10-CM | POA: Diagnosis not present

## 2017-11-26 DIAGNOSIS — E782 Mixed hyperlipidemia: Secondary | ICD-10-CM | POA: Diagnosis not present

## 2017-11-26 DIAGNOSIS — I10 Essential (primary) hypertension: Secondary | ICD-10-CM | POA: Diagnosis not present

## 2017-12-04 DIAGNOSIS — J449 Chronic obstructive pulmonary disease, unspecified: Secondary | ICD-10-CM | POA: Diagnosis not present

## 2017-12-11 ENCOUNTER — Ambulatory Visit: Payer: PPO | Admitting: Radiation Oncology

## 2017-12-22 ENCOUNTER — Ambulatory Visit
Admission: RE | Admit: 2017-12-22 | Discharge: 2017-12-22 | Disposition: A | Discharge status: Home / Self Care | Payer: PPO | Source: Ambulatory Visit | Attending: Radiation Oncology | Admitting: Radiation Oncology

## 2017-12-22 ENCOUNTER — Other Ambulatory Visit: Payer: Self-pay

## 2017-12-22 ENCOUNTER — Encounter: Payer: Self-pay | Admitting: Radiation Oncology

## 2017-12-22 VITALS — BP 144/81 | HR 87 | Temp 97.6°F | Wt 104.8 lb

## 2017-12-22 DIAGNOSIS — Z923 Personal history of irradiation: Secondary | ICD-10-CM | POA: Diagnosis not present

## 2017-12-22 DIAGNOSIS — J9601 Acute respiratory failure with hypoxia: Secondary | ICD-10-CM | POA: Insufficient documentation

## 2017-12-22 DIAGNOSIS — Z9049 Acquired absence of other specified parts of digestive tract: Secondary | ICD-10-CM | POA: Insufficient documentation

## 2017-12-22 DIAGNOSIS — E279 Disorder of adrenal gland, unspecified: Secondary | ICD-10-CM | POA: Diagnosis not present

## 2017-12-22 DIAGNOSIS — I10 Essential (primary) hypertension: Secondary | ICD-10-CM | POA: Diagnosis not present

## 2017-12-22 DIAGNOSIS — Z8614 Personal history of Methicillin resistant Staphylococcus aureus infection: Secondary | ICD-10-CM | POA: Diagnosis not present

## 2017-12-22 DIAGNOSIS — Z9221 Personal history of antineoplastic chemotherapy: Secondary | ICD-10-CM | POA: Insufficient documentation

## 2017-12-22 DIAGNOSIS — E785 Hyperlipidemia, unspecified: Secondary | ICD-10-CM | POA: Insufficient documentation

## 2017-12-22 DIAGNOSIS — Z853 Personal history of malignant neoplasm of breast: Secondary | ICD-10-CM | POA: Insufficient documentation

## 2017-12-22 DIAGNOSIS — F419 Anxiety disorder, unspecified: Secondary | ICD-10-CM | POA: Insufficient documentation

## 2017-12-22 DIAGNOSIS — Z90710 Acquired absence of both cervix and uterus: Secondary | ICD-10-CM | POA: Diagnosis not present

## 2017-12-22 DIAGNOSIS — I509 Heart failure, unspecified: Secondary | ICD-10-CM | POA: Diagnosis not present

## 2017-12-22 DIAGNOSIS — M810 Age-related osteoporosis without current pathological fracture: Secondary | ICD-10-CM | POA: Diagnosis not present

## 2017-12-22 DIAGNOSIS — Z7982 Long term (current) use of aspirin: Secondary | ICD-10-CM | POA: Insufficient documentation

## 2017-12-22 DIAGNOSIS — J449 Chronic obstructive pulmonary disease, unspecified: Secondary | ICD-10-CM | POA: Insufficient documentation

## 2017-12-22 DIAGNOSIS — I2510 Atherosclerotic heart disease of native coronary artery without angina pectoris: Secondary | ICD-10-CM | POA: Insufficient documentation

## 2017-12-22 DIAGNOSIS — Z803 Family history of malignant neoplasm of breast: Secondary | ICD-10-CM | POA: Diagnosis not present

## 2017-12-22 DIAGNOSIS — E538 Deficiency of other specified B group vitamins: Secondary | ICD-10-CM | POA: Diagnosis not present

## 2017-12-22 DIAGNOSIS — Z85038 Personal history of other malignant neoplasm of large intestine: Secondary | ICD-10-CM | POA: Insufficient documentation

## 2017-12-22 DIAGNOSIS — F17210 Nicotine dependence, cigarettes, uncomplicated: Secondary | ICD-10-CM | POA: Diagnosis not present

## 2017-12-22 DIAGNOSIS — R918 Other nonspecific abnormal finding of lung field: Secondary | ICD-10-CM | POA: Insufficient documentation

## 2017-12-22 DIAGNOSIS — Z79899 Other long term (current) drug therapy: Secondary | ICD-10-CM | POA: Diagnosis not present

## 2017-12-22 DIAGNOSIS — J45909 Unspecified asthma, uncomplicated: Secondary | ICD-10-CM | POA: Insufficient documentation

## 2017-12-22 DIAGNOSIS — Z8701 Personal history of pneumonia (recurrent): Secondary | ICD-10-CM | POA: Insufficient documentation

## 2017-12-22 NOTE — Consult Note (Signed)
NEW PATIENT EVALUATION  Name: Robin Hudson  MRN: [id number]  Date:   12/22/2017     DOB: 07-16-1941   This 77 y.o. 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  REFERRING PHYSICIAN: Adin Hector, MD  CHIEF COMPLAINT:  Chief Complaint  Patient presents with  . Lung Mass    initial evaluation    DIAGNOSIS: The encounter diagnosis was Lung mass.   PREVIOUS INVESTIGATIONS:  CT scans and serial fashion as well as PET CT scans reviewed Clinical notes reviewed   HPI: Patient is a 76 year old female with multiple comorbidities including COPD chronic hypoxic respiratory failure hypertension coronary artery disease and B-12 deficiency. She been followed by her PMD as well as pulmonology for a PET positive left upper lobe lesion. This new nodular density was first appreciated back in August 2018. It was confirmed is a 1.7 cm hypermetabolic mass in the spirit segment of the left lower lobe new since 2017. Recent CT scan showed interval increase in the left upper lobe lesion now measuring 1.9 cm and demonstrating central necrosis. This PET/CT images of malignancy. She's been scheduled for CT biopsy although that was canceled by the patient. I been asked by pulmonology to evaluate the patient for possible SB RT. She is fairly asymptomatic at this time specifically denies cough hemoptysis chest tightness or dysphagia.  PLANNED TREATMENT REGIMEN: SB RT  PAST MEDICAL HISTORY:  has a past medical history of Allergic rhinitis, Anxiety, Asthma, B12 deficiency, Breast cancer ([place]) (1999), Carcinoma of right breast treated with adjuvant chemotherapy ([place]) (1999), CHF (congestive heart failure) ([place]), Colon cancer ([place]) (2010), Colon cancer ([place]), COPD (chronic obstructive pulmonary disease) ([place]), Hyperlipemia, Hypertension, MRSA pneumonia ([place]), Osteoporosis, Radiation (1999), and Right adrenal mass ([place]).    PAST  SURGICAL HISTORY:  Past Surgical History:  Procedure Laterality Date  . ABDOMINAL HYSTERECTOMY    . APPENDECTOMY    . BREAST SURGERY Right    lumpectomy x2 with lymph nodes  . CARPAL TUNNEL RELEASE Left 06/04/2016   Procedure: CARPAL TUNNEL RELEASE;  Surgeon: Hessie Knows, MD;  Location: [facility] ORS;  Service: Orthopedics;  Laterality: Left;  . colectomy     partial  . IMAGE GUIDED SINUS SURGERY    . OPEN REDUCTION INTERNAL FIXATION (ORIF) DISTAL RADIAL FRACTURE Left 06/04/2016   Procedure: OPEN REDUCTION INTERNAL FIXATION (ORIF) DISTAL RADIAL FRACTURE;  Surgeon: Hessie Knows, MD;  Location: [facility] ORS;  Service: Orthopedics;  Laterality: Left;    FAMILY HISTORY: family history includes Breast cancer in her other; Breast cancer (age of onset: 58) in her sister; Breast cancer (age of onset: 33) in her sister; Heart attack in her mother.  SOCIAL HISTORY:  reports that she has been smoking cigarettes.  She has been smoking about 0.25 packs per day. she has never used smokeless tobacco. She reports that she does not drink alcohol or use drugs.  ALLERGIES: Coreg [carvedilol]  MEDICATIONS:  Current Outpatient Medications  Medication Sig Dispense Refill  . albuterol (PROAIR HFA) 108 (90 Base) MCG/ACT inhaler INHALE 2 PUFFS BY MOUTH INTO THE LUNGS EVERY 6 HOURS AS NEEDED FOR WHEEZING    . cyanocobalamin (,VITAMIN B-12,) 1000 MCG/ML injection Inject into the muscle.    . escitalopram (LEXAPRO) 20 MG tablet Take by mouth.    . Fluticasone-Salmeterol (ADVAIR DISKUS) 250-50 MCG/DOSE AEPB INHALE 1 PUFF INTO THE LUNGS EVERY 12 HOURS    . gabapentin (NEURONTIN)  100 MG capsule Take by mouth.    Marland Kitchen HYDROcodone-acetaminophen (NORCO) 7.5-325 MG tablet Take by mouth.    Marland Kitchen tiZANidine (ZANAFLEX) 2 MG tablet 1-2 po bid prn    . albuterol (PROVENTIL) (2.5 MG/3ML) 0.083% nebulizer solution Take 2.5 mg by nebulization every 6 (six) hours as needed for wheezing or shortness of breath.    . ALPRAZolam (XANAX) 0.5 MG  tablet Take 1 tablet by mouth 2 (two) times daily as needed for anxiety.   3  . aspirin EC 81 MG tablet Take 1 tablet (81 mg total) by mouth daily. 30 tablet 2  . atenolol (TENORMIN) 50 MG tablet Take 1 tablet (50 mg total) by mouth daily. (Patient taking differently: Take 25 mg by mouth daily. ) 30 tablet 11  . azelastine (ASTELIN) 0.1 % nasal spray Place 1 spray into both nostrils 2 (two) times daily. Use in each nostril as directed    . butalbital-acetaminophen-caffeine (FIORICET, ESGIC) 50-325-40 MG tablet TK 1 T PO  TID PRF HEADACHE  0  . cholecalciferol (VITAMIN D) 1000 units tablet Take 1,000 Units by mouth daily.    . hydroxychloroquine (PLAQUENIL) 200 MG tablet Take 400 mg by mouth daily. Reported on 12/06/2015    . lidocaine (LIDODERM) 5 % Place 1 patch every 12 (twelve) hours onto the skin. Remove & Discard patch within 12 hours or as directed by MD 10 patch 0  . losartan (COZAAR) 50 MG tablet Take 1 tablet (50 mg total) by mouth daily. 30 tablet 11  . meclizine (ANTIVERT) 12.5 MG tablet Take 1 tablet (12.5 mg total) by mouth 3 (three) times daily as needed for dizziness. 6 tablet 0  . montelukast (SINGULAIR) 10 MG tablet Take 10 mg by mouth at bedtime.    . pantoprazole (PROTONIX) 20 MG tablet Take 1 tablet (20 mg total) 2 (two) times daily by mouth. 60 tablet 0  . polyethylene glycol (MIRALAX / GLYCOLAX) packet Take 17 g daily by mouth. Mix one tablespoon with 8oz of your favorite juice or water every day until you are having soft formed stools. Then start taking once daily if you didn't have a stool the day before. 30 each 0  . SPIRIVA HANDIHALER 18 MCG inhalation capsule Take 1 Inhaler by mouth daily.  11  . traMADol (ULTRAM) 50 MG tablet Take 50 mg by mouth every 6 (six) hours as needed.     No current facility-administered medications for this encounter.     ECOG PERFORMANCE STATUS:  0 - Asymptomatic  REVIEW OF SYSTEMS:  Patient denies any weight loss, fatigue, weakness, fever,  chills or night sweats. Patient denies any loss of vision, blurred vision. Patient denies any ringing  of the ears or hearing loss. No irregular heartbeat. Patient denies heart murmur or history of fainting. Patient denies any chest pain or pain radiating to her upper extremities. Patient denies any shortness of breath, difficulty breathing at night, cough or hemoptysis. Patient denies any swelling in the lower legs. Patient denies any nausea vomiting, vomiting of blood, or coffee ground material in the vomitus. Patient denies any stomach pain. Patient states has had normal bowel movements no significant constipation or diarrhea. Patient denies any dysuria, hematuria or significant nocturia. Patient denies any problems walking, swelling in the joints or loss of balance. Patient denies any skin changes, loss of hair or loss of weight. Patient denies any excessive worrying or anxiety or significant depression. Patient denies any problems with insomnia. Patient denies excessive thirst, polyuria, polydipsia.  Patient denies any swollen glands, patient denies easy bruising or easy bleeding. Patient denies any recent infections, allergies or URI. Patient "s visual fields have not changed significantly in recent time.    PHYSICAL EXAM: BP (!) 144/81   Pulse 87   Temp 97.6 F (36.4 C)   Wt 104 lb 13.3 oz (47.6 kg)   BMI 17.99 kg/m  Well-developed well-nourished patient in NAD. HEENT reveals PERLA, EOMI, discs not visualized.  Oral cavity is clear. No oral mucosal lesions are identified. Neck is clear without evidence of cervical or supraclavicular adenopathy. Lungs are clear to A&P. Cardiac examination is essentially unremarkable with regular rate and rhythm without murmur rub or thrill. Abdomen is benign with no organomegaly or masses noted. Motor sensory and DTR levels are equal and symmetric in the upper and lower extremities. Cranial nerves II through XII are grossly intact. Proprioception is intact. No  peripheral adenopathy or edema is identified. No motor or sensory levels are noted. Crude visual fields are within normal range.  LABORATORY DATA: No CT biopsy was performed    RADIOLOGY RESULTS: CT scans and a serial fashion as well as PET CT scan are reviewed and compatible with the above-stated findings   IMPRESSION: Stage I non-small cell lung cancer of the superior segment of the left lower lobe in 77 year old female  PLAN: At this time patient on the patient's reluctance to undergo CT biopsy based on a poor pulmonary status I would opt to treat with SB RT 6000 cGy in 5 fractions. Would use for D study in simulation as well as PET CT fusion study. Risks and benefits of treatment including possible fatigue possible development of cough skin reaction all were discussed in detail with the patient. I personally set up and ordered CT simulation for later this week. Patient seems to comprehend my treatment plan well.  I would like to take this opportunity to thank you for allowing me to participate in the care of your patient.Noreene Filbert, MD

## 2017-12-25 ENCOUNTER — Ambulatory Visit: Payer: PPO

## 2017-12-25 ENCOUNTER — Other Ambulatory Visit: Payer: Self-pay

## 2017-12-30 ENCOUNTER — Ambulatory Visit
Admission: RE | Admit: 2017-12-30 | Discharge: 2017-12-30 | Disposition: A | Discharge status: Home / Self Care | Payer: PPO | Source: Ambulatory Visit | Attending: Radiation Oncology | Admitting: Radiation Oncology

## 2017-12-30 DIAGNOSIS — Z510 Encounter for antineoplastic radiation therapy: Secondary | ICD-10-CM | POA: Insufficient documentation

## 2017-12-30 DIAGNOSIS — C3432 Malignant neoplasm of lower lobe, left bronchus or lung: Secondary | ICD-10-CM | POA: Insufficient documentation

## 2017-12-30 DIAGNOSIS — F17210 Nicotine dependence, cigarettes, uncomplicated: Secondary | ICD-10-CM | POA: Diagnosis not present

## 2017-12-30 DIAGNOSIS — Z720 Tobacco use: Secondary | ICD-10-CM | POA: Insufficient documentation

## 2017-12-30 DIAGNOSIS — R918 Other nonspecific abnormal finding of lung field: Secondary | ICD-10-CM | POA: Diagnosis not present

## 2017-12-31 DIAGNOSIS — E538 Deficiency of other specified B group vitamins: Secondary | ICD-10-CM | POA: Diagnosis not present

## 2017-12-31 DIAGNOSIS — Z510 Encounter for antineoplastic radiation therapy: Secondary | ICD-10-CM | POA: Diagnosis not present

## 2017-12-31 DIAGNOSIS — E038 Other specified hypothyroidism: Secondary | ICD-10-CM | POA: Diagnosis not present

## 2017-12-31 DIAGNOSIS — R918 Other nonspecific abnormal finding of lung field: Secondary | ICD-10-CM | POA: Diagnosis not present

## 2017-12-31 DIAGNOSIS — F17210 Nicotine dependence, cigarettes, uncomplicated: Secondary | ICD-10-CM | POA: Diagnosis not present

## 2018-01-04 DIAGNOSIS — J449 Chronic obstructive pulmonary disease, unspecified: Secondary | ICD-10-CM | POA: Diagnosis not present

## 2018-01-06 DIAGNOSIS — M542 Cervicalgia: Secondary | ICD-10-CM | POA: Diagnosis not present

## 2018-01-06 DIAGNOSIS — M65331 Trigger finger, right middle finger: Secondary | ICD-10-CM | POA: Diagnosis not present

## 2018-01-06 DIAGNOSIS — M059 Rheumatoid arthritis with rheumatoid factor, unspecified: Secondary | ICD-10-CM | POA: Diagnosis not present

## 2018-01-06 DIAGNOSIS — C3412 Malignant neoplasm of upper lobe, left bronchus or lung: Secondary | ICD-10-CM | POA: Diagnosis not present

## 2018-01-06 DIAGNOSIS — M47814 Spondylosis without myelopathy or radiculopathy, thoracic region: Secondary | ICD-10-CM | POA: Diagnosis not present

## 2018-01-06 DIAGNOSIS — M810 Age-related osteoporosis without current pathological fracture: Secondary | ICD-10-CM | POA: Diagnosis not present

## 2018-01-06 DIAGNOSIS — M546 Pain in thoracic spine: Secondary | ICD-10-CM | POA: Diagnosis not present

## 2018-01-12 ENCOUNTER — Ambulatory Visit: Payer: PPO

## 2018-01-14 ENCOUNTER — Ambulatory Visit
Admission: RE | Admit: 2018-01-14 | Discharge: 2018-01-14 | Disposition: A | Discharge status: Home / Self Care | Payer: PPO | Source: Ambulatory Visit | Attending: Radiation Oncology | Admitting: Radiation Oncology

## 2018-01-14 DIAGNOSIS — Z510 Encounter for antineoplastic radiation therapy: Secondary | ICD-10-CM | POA: Diagnosis not present

## 2018-01-19 ENCOUNTER — Ambulatory Visit
Admission: RE | Admit: 2018-01-19 | Discharge: 2018-01-19 | Disposition: A | Discharge status: Home / Self Care | Payer: PPO | Source: Ambulatory Visit | Attending: Radiation Oncology | Admitting: Radiation Oncology

## 2018-01-19 DIAGNOSIS — C3432 Malignant neoplasm of lower lobe, left bronchus or lung: Secondary | ICD-10-CM | POA: Diagnosis not present

## 2018-01-19 DIAGNOSIS — Z510 Encounter for antineoplastic radiation therapy: Secondary | ICD-10-CM | POA: Insufficient documentation

## 2018-01-19 DIAGNOSIS — Z720 Tobacco use: Secondary | ICD-10-CM | POA: Diagnosis not present

## 2018-01-20 IMAGING — CT CT ANGIO CHEST
3 of 7 series · 18 of 46 positions shown · IV contrast (APPLIED)
Comparison: Chest radiograph performed earlier today at [DATE] a.m.,
and CTA of the chest performed 09/23/2015

CLINICAL DATA: Acute onset of nausea and shortness of breath. High
blood pressure. Assess for aortic dissection. Initial encounter.

EXAM:
CT ANGIOGRAPHY CHEST WITH CONTRAST
TECHNIQUE: Multidetector CT imaging of the chest was performed using the
standard protocol during bolus administration of intravenous
contrast. Multiplanar CT image reconstructions and MIPs were
obtained to evaluate the vascular anatomy.
CONTRAST:  100 mL of Isovue 370 IV contrast

[Series 5: axial arterial · axial · arterial · 0.59mm/px · z∈[-772,-508]mm · 13 of 104 slices shown]
[im 8/104  lung]
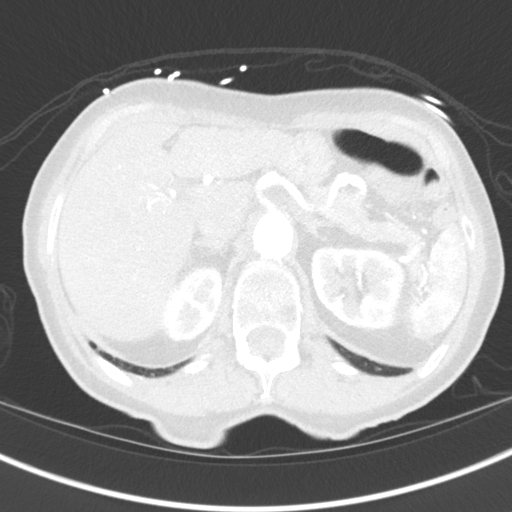
[im 15/104  soft-tissue]
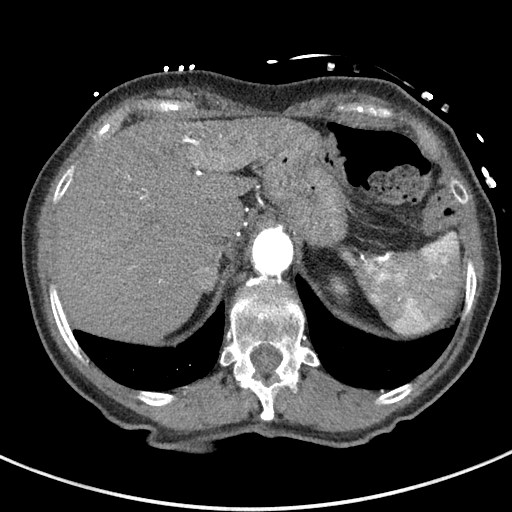
[im 23/104  lung]
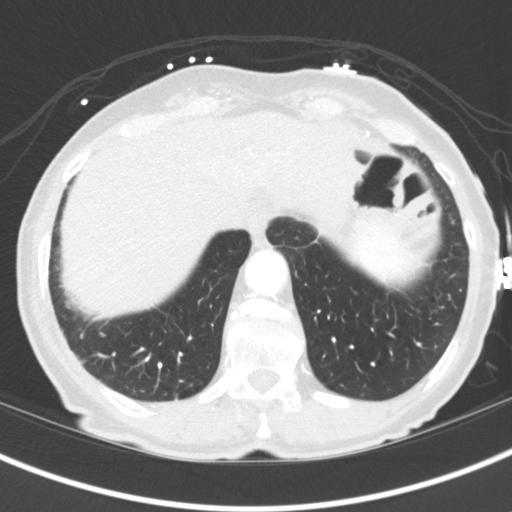
[im 30/104  soft-tissue]
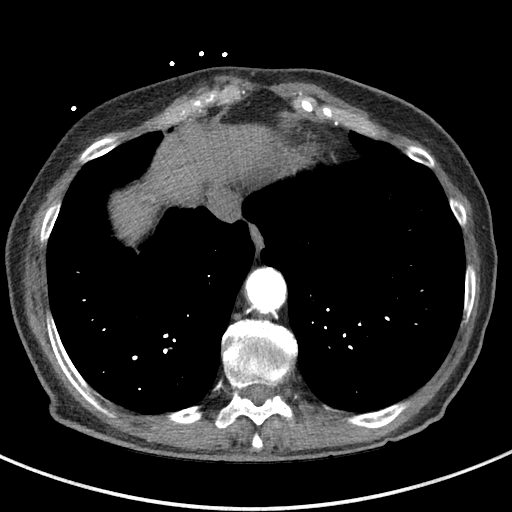
[im 37/104  lung]
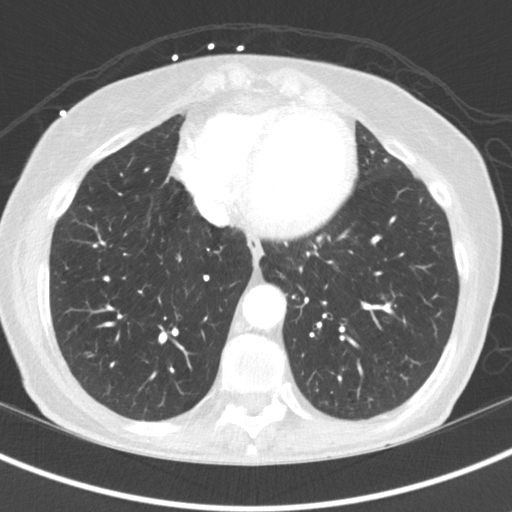
[im 45/104  soft-tissue]
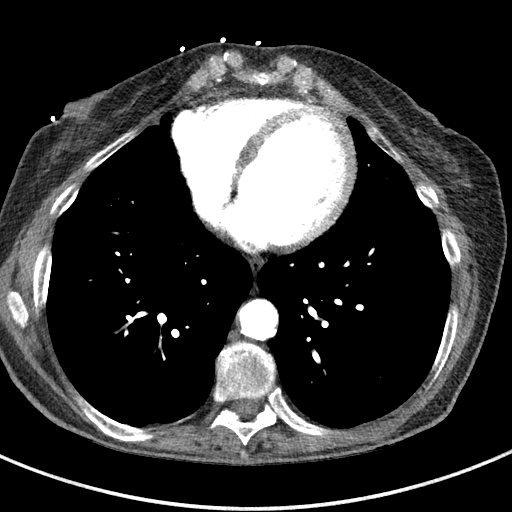
[im 52/104  lung]
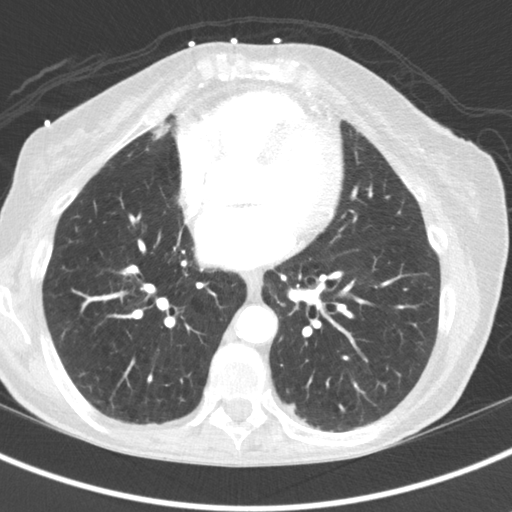
[im 59/104  soft-tissue]
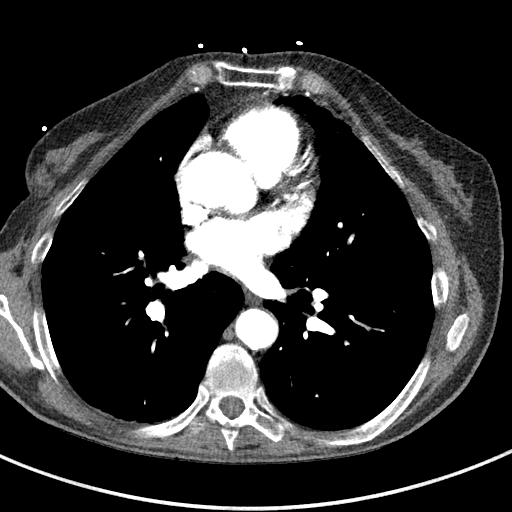
[im 67/104  lung]
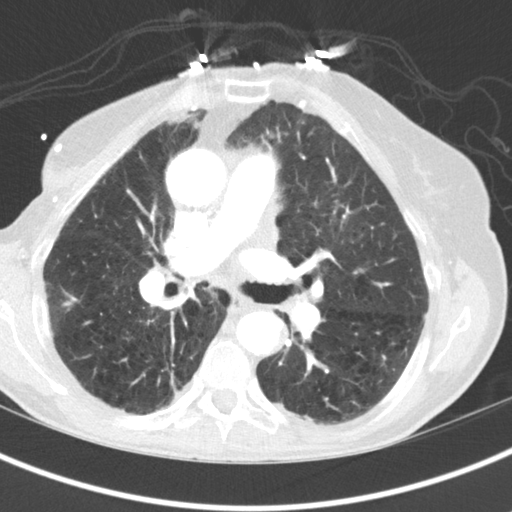
[im 74/104  soft-tissue]
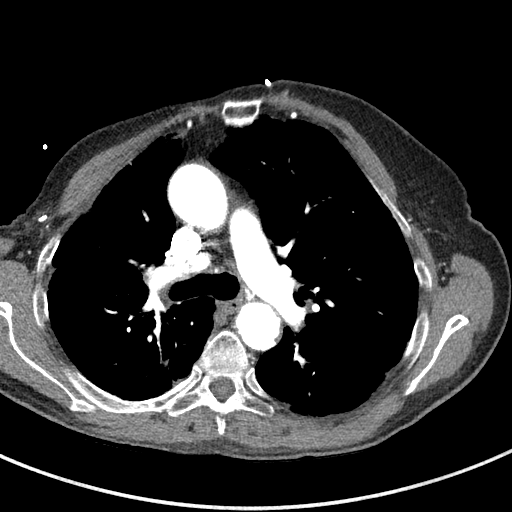
[im 81/104  lung]
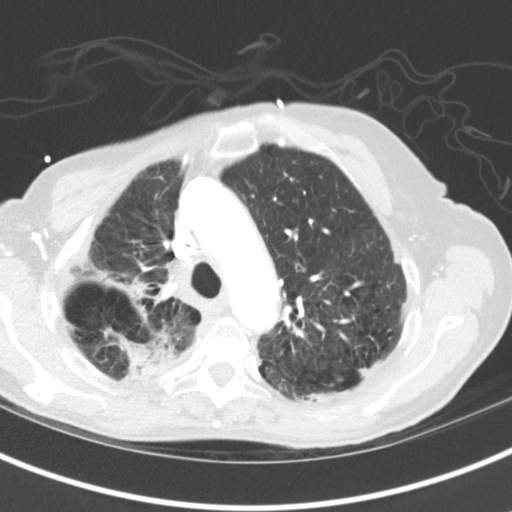
[im 89/104  soft-tissue]
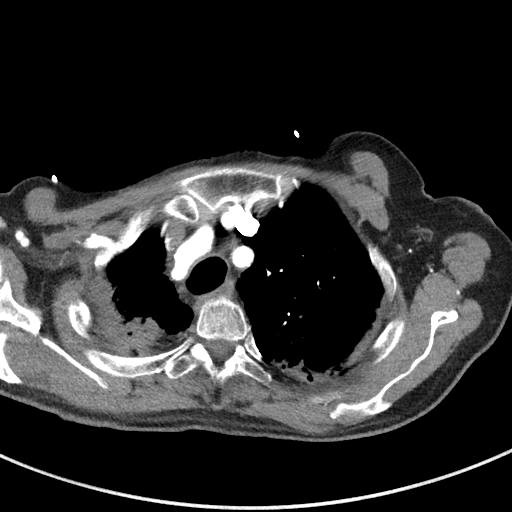
[im 96/104  lung]
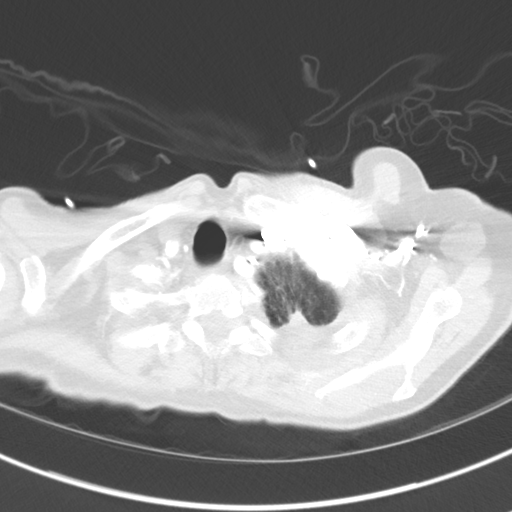

[Series 6: lung · axial · 0.59mm/px · z∈[-760,-720]mm · 2 of 63 slices shown]
[im 8/63  soft-tissue]
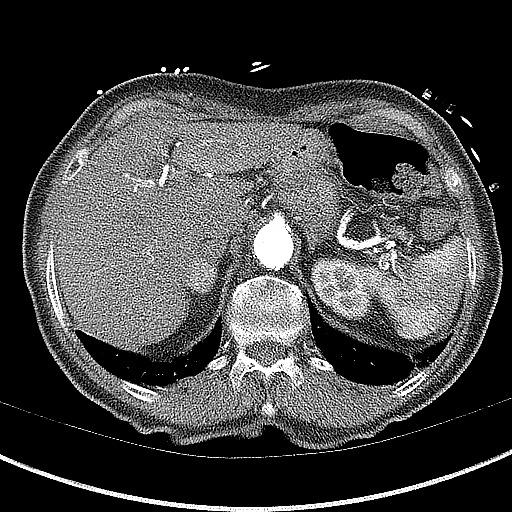
[im 16/63  soft-tissue]
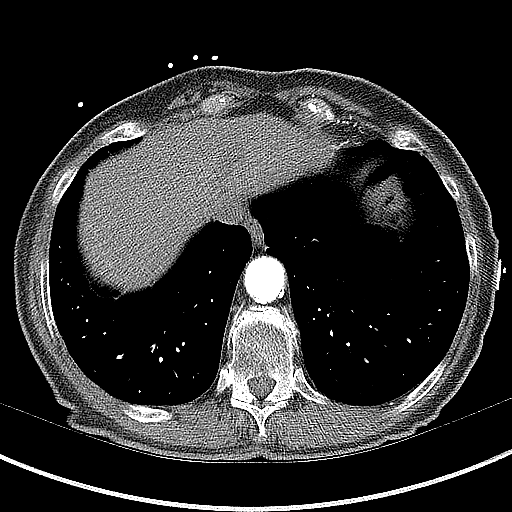

[Series 7: coronals · coronal · 0.66mm/px · 3 of 125 slices shown]
[im 32/125  soft-tissue]
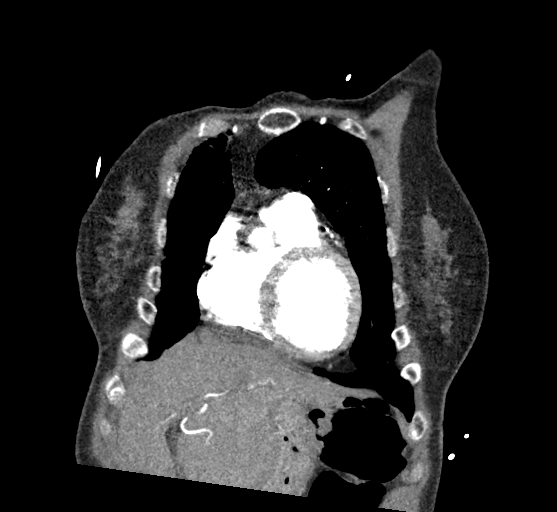
[im 63/125  soft-tissue]
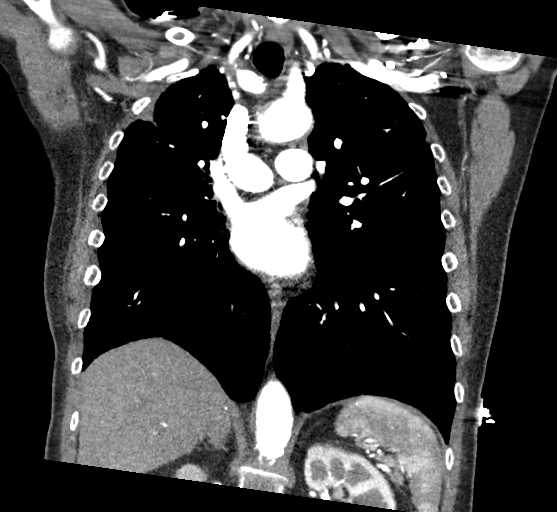
[im 94/125  soft-tissue]
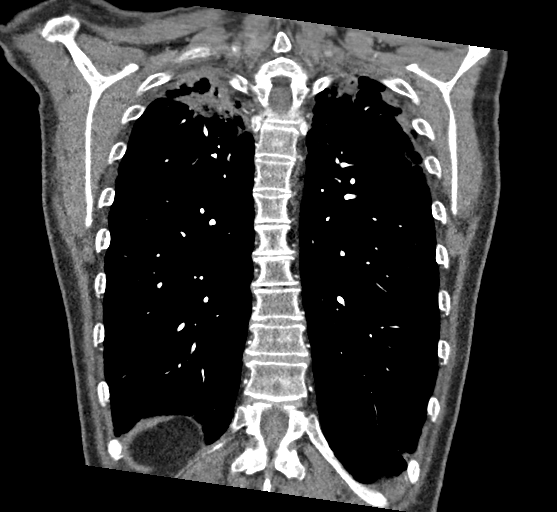

[18 of 46 positions shown; findings below may reference images not displayed]

FINDINGS: There is no evidence of aortic dissection. There is no evidence of
aneurysmal dilatation. Scattered calcification is seen along the
aortic arch and descending thoracic aorta. The great vessels are
grossly unremarkable in appearance.

There is no evidence of pulmonary embolus.

Biapical scarring is noted, with a 5 mm nodule at the left lung
apex. Underlying emphysematous change is noted bilaterally, most
prominent at the upper lung lobes. There is no evidence of pleural
effusion or pneumothorax. No dominant masses are identified; no
abnormal focal contrast enhancement is seen.

Scattered coronary artery calcifications are seen. No mediastinal
lymphadenopathy is seen. No pericardial effusion is identified. No
axillary lymphadenopathy is seen. The visualized portions of the
thyroid gland are unremarkable in appearance.

The visualized portions of the liver and spleen are unremarkable.
The visualized portions of the pancreas, adrenal glands and kidneys
are within normal limits. A 2.3 cm right adrenal nodule is stable in
appearance from 1490 and likely benign.

No acute osseous abnormalities are seen.

Review of the MIP images confirms the above findings.
IMPRESSION: 1. No evidence of aortic dissection. No evidence of aneurysmal
dilatation. Scattered calcification along the aortic arch and
descending thoracic aorta.
2. No evidence for pulmonary embolus.
3. Biapical scarring, with a 5 mm nodule at the left lung apex. No
follow-up needed if patient is low-risk. Non-contrast chest CT can
be considered in 12 months if patient is high-risk. This
recommendation follows the consensus statement: Guidelines for
Management of Incidental Pulmonary Nodules Detected on CT
Images:From the [HOSPITAL] 4164; published online before
print (10.1148/radiol.[id number]).
4. Underlying emphysematous change noted bilaterally, most prominent
at the upper lung lobes.
5. Scattered coronary artery calcifications seen.
6. Stable 2.3 cm right adrenal nodule is likely benign.

## 2018-01-21 ENCOUNTER — Ambulatory Visit
Admission: RE | Admit: 2018-01-21 | Discharge: 2018-01-21 | Disposition: A | Discharge status: Home / Self Care | Payer: PPO | Source: Ambulatory Visit | Attending: Radiation Oncology | Admitting: Radiation Oncology

## 2018-01-21 DIAGNOSIS — Z510 Encounter for antineoplastic radiation therapy: Secondary | ICD-10-CM | POA: Diagnosis not present

## 2018-01-26 ENCOUNTER — Ambulatory Visit
Admission: RE | Admit: 2018-01-26 | Discharge: 2018-01-26 | Disposition: A | Discharge status: Home / Self Care | Payer: PPO | Source: Ambulatory Visit | Attending: Radiation Oncology | Admitting: Radiation Oncology

## 2018-01-26 DIAGNOSIS — Z510 Encounter for antineoplastic radiation therapy: Secondary | ICD-10-CM | POA: Diagnosis not present

## 2018-01-28 ENCOUNTER — Ambulatory Visit
Admission: RE | Admit: 2018-01-28 | Discharge: 2018-01-28 | Disposition: A | Discharge status: Home / Self Care | Payer: PPO | Source: Ambulatory Visit | Attending: Radiation Oncology | Admitting: Radiation Oncology

## 2018-01-28 DIAGNOSIS — F17210 Nicotine dependence, cigarettes, uncomplicated: Secondary | ICD-10-CM | POA: Diagnosis not present

## 2018-01-28 DIAGNOSIS — Z510 Encounter for antineoplastic radiation therapy: Secondary | ICD-10-CM | POA: Diagnosis not present

## 2018-01-28 DIAGNOSIS — R918 Other nonspecific abnormal finding of lung field: Secondary | ICD-10-CM | POA: Diagnosis not present

## 2018-02-01 DIAGNOSIS — J449 Chronic obstructive pulmonary disease, unspecified: Secondary | ICD-10-CM | POA: Diagnosis not present

## 2018-02-02 DIAGNOSIS — M5136 Other intervertebral disc degeneration, lumbar region: Secondary | ICD-10-CM | POA: Diagnosis not present

## 2018-02-02 DIAGNOSIS — E538 Deficiency of other specified B group vitamins: Secondary | ICD-10-CM | POA: Diagnosis not present

## 2018-02-02 DIAGNOSIS — M5412 Radiculopathy, cervical region: Secondary | ICD-10-CM | POA: Diagnosis not present

## 2018-02-02 DIAGNOSIS — M5030 Other cervical disc degeneration, unspecified cervical region: Secondary | ICD-10-CM | POA: Diagnosis not present

## 2018-02-02 DIAGNOSIS — M62838 Other muscle spasm: Secondary | ICD-10-CM | POA: Diagnosis not present

## 2018-02-02 DIAGNOSIS — M25551 Pain in right hip: Secondary | ICD-10-CM | POA: Diagnosis not present

## 2018-02-02 DIAGNOSIS — M5416 Radiculopathy, lumbar region: Secondary | ICD-10-CM | POA: Diagnosis not present

## 2018-02-10 IMAGING — CR DG CHEST 2V
3 series · 3 of 3 positions shown · non-contrast
Comparison: CTA chest dated 06/28/2016

CLINICAL DATA: Shortness of breath

EXAM:
CHEST  2 VIEW

[chest lat (1 of 2)]
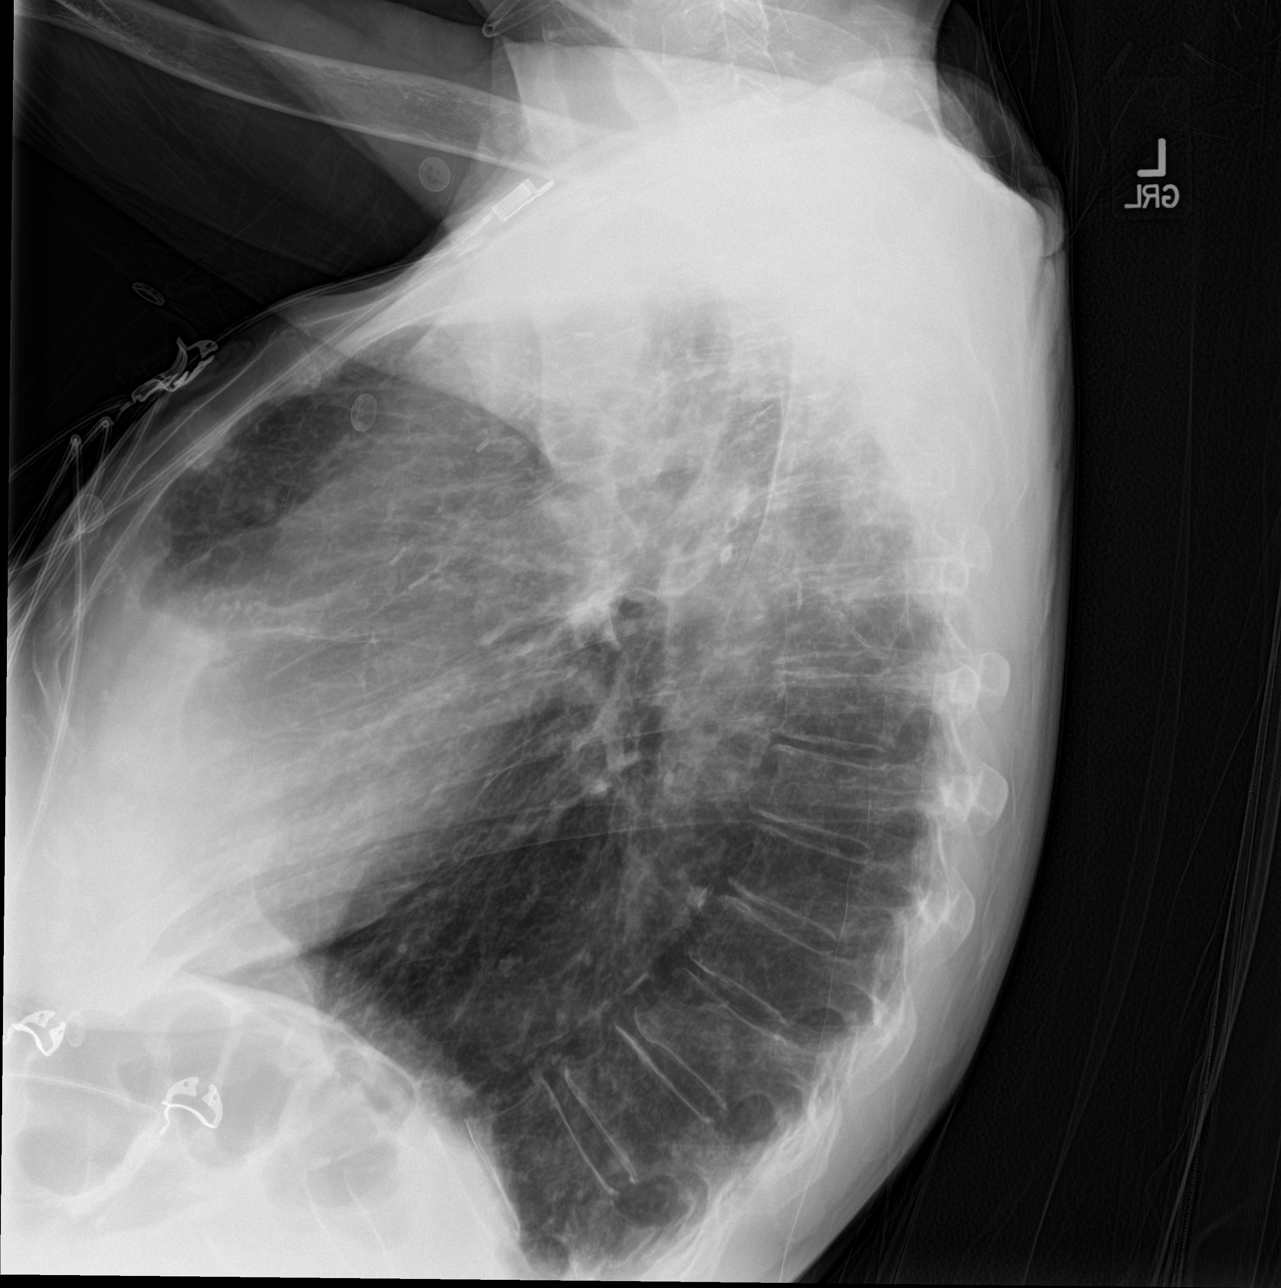

[chest ap]
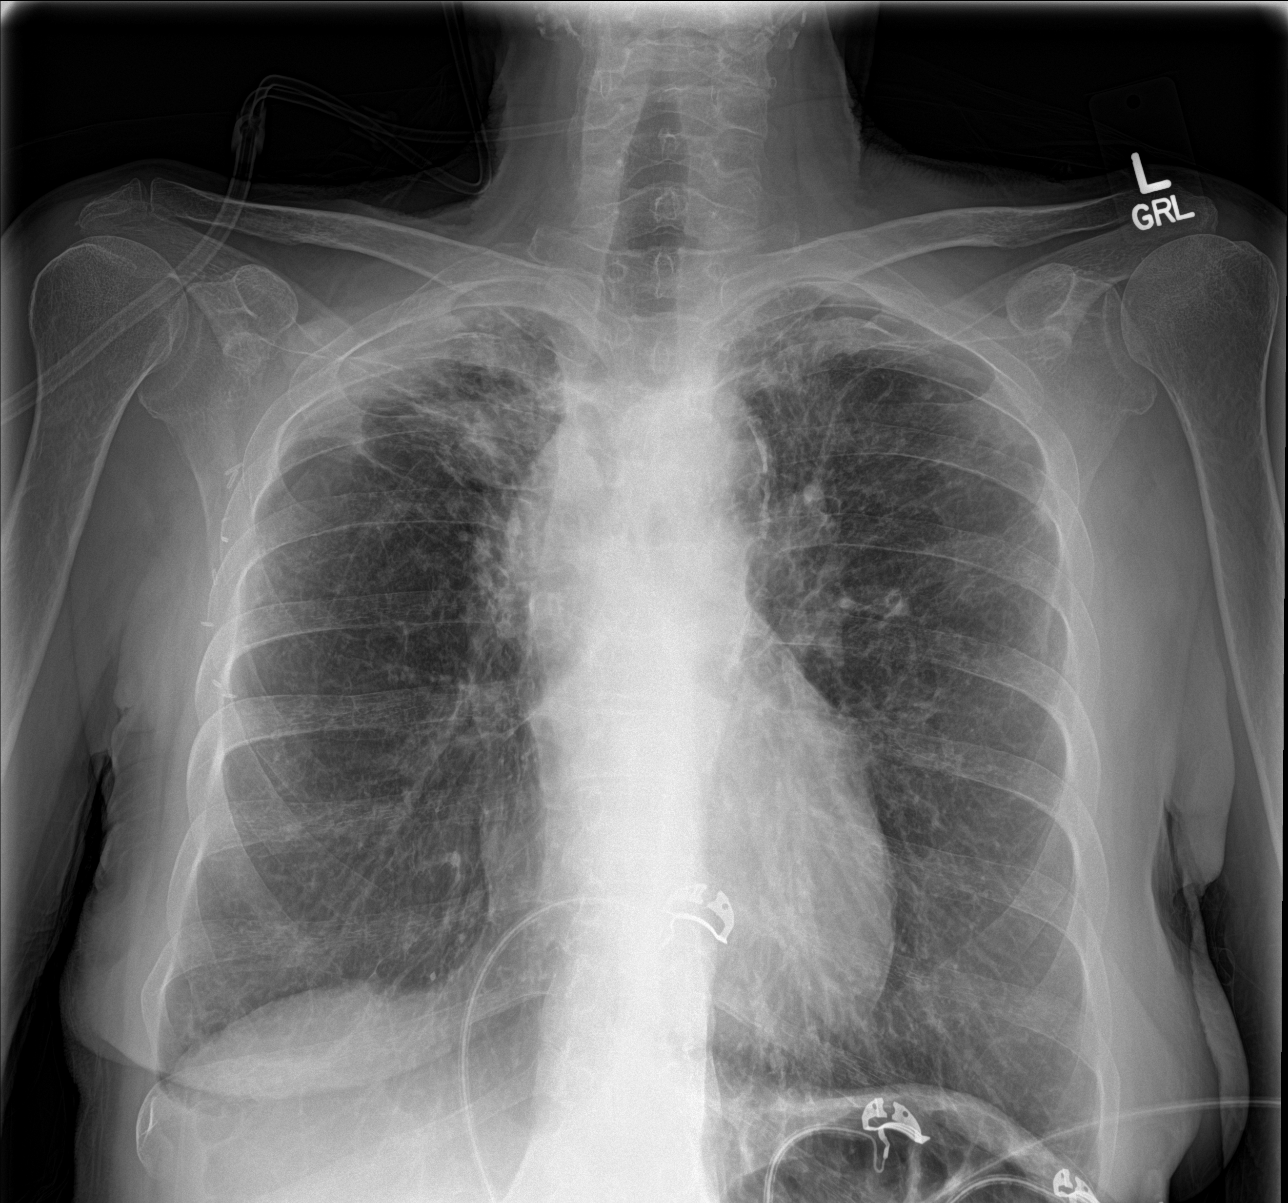

[chest lat (2 of 2)]
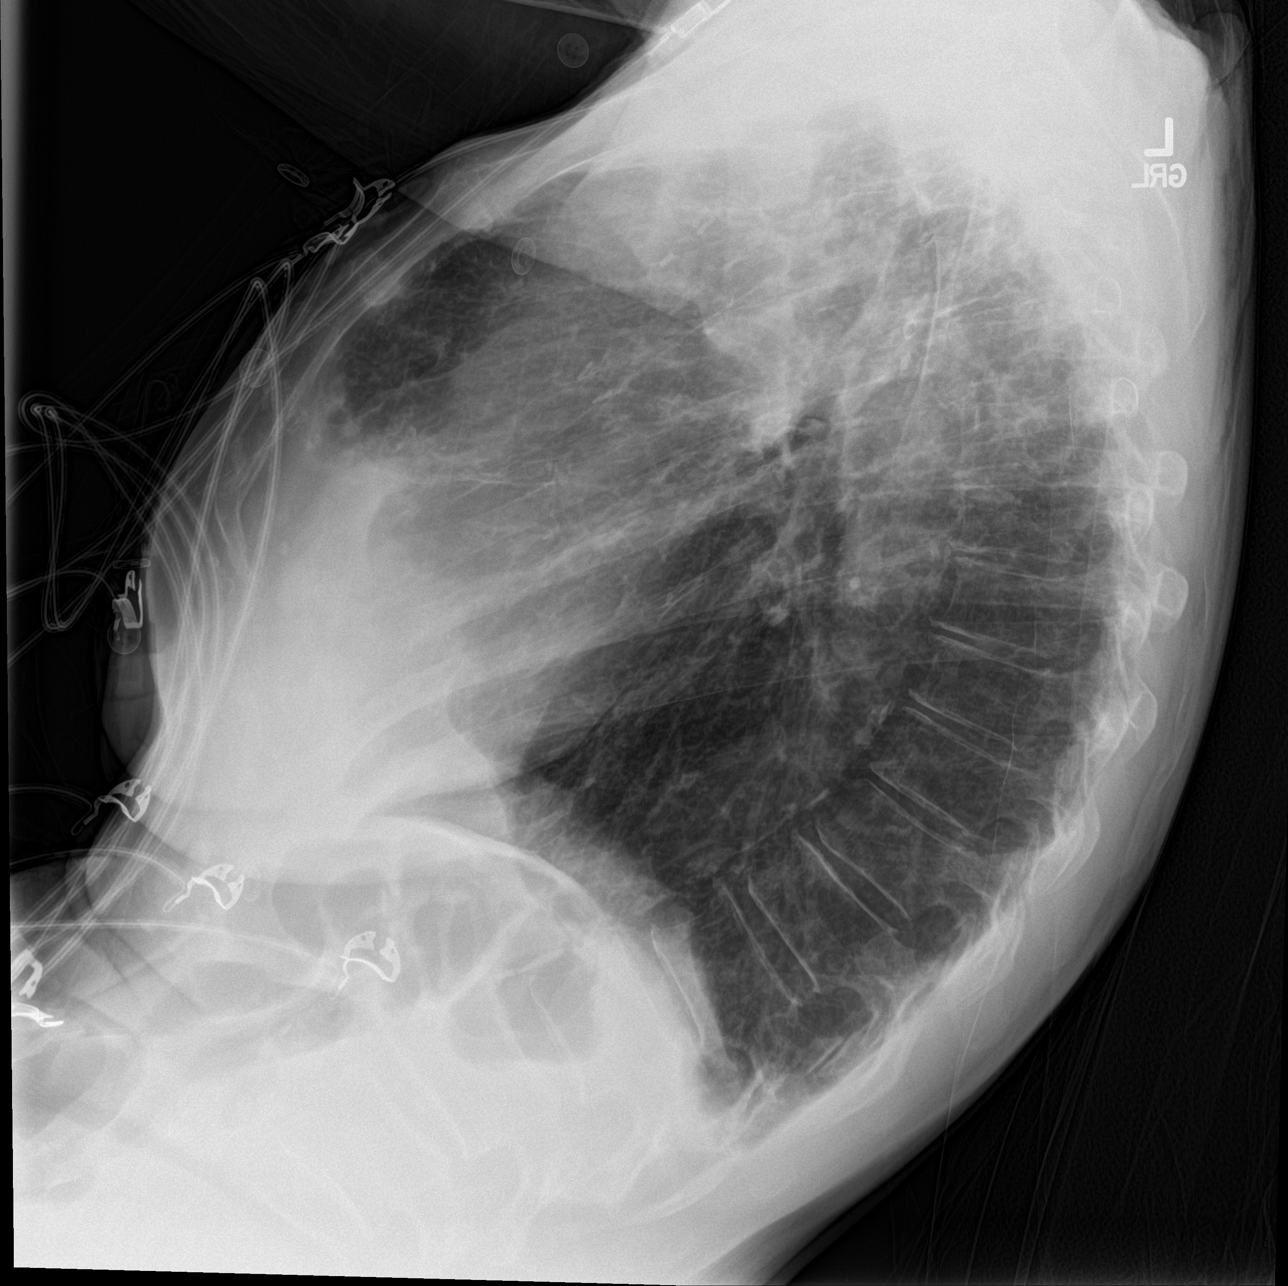

[3 of 3 positions shown; findings below may reference images not displayed]

FINDINGS: Biapical pleural-parenchymal scarring, right greater than left. No
focal consolidation. No pleural effusion or pneumothorax.

The heart is normal in size.

Mild degenerative changes of the visualized thoracolumbar spine.
IMPRESSION: No evidence of acute cardiopulmonary disease.

## 2018-02-26 DIAGNOSIS — M0579 Rheumatoid arthritis with rheumatoid factor of multiple sites without organ or systems involvement: Secondary | ICD-10-CM | POA: Diagnosis not present

## 2018-02-26 DIAGNOSIS — J9611 Chronic respiratory failure with hypoxia: Secondary | ICD-10-CM | POA: Diagnosis not present

## 2018-02-26 DIAGNOSIS — J069 Acute upper respiratory infection, unspecified: Secondary | ICD-10-CM | POA: Diagnosis not present

## 2018-02-26 DIAGNOSIS — E279 Disorder of adrenal gland, unspecified: Secondary | ICD-10-CM | POA: Diagnosis not present

## 2018-02-26 DIAGNOSIS — J439 Emphysema, unspecified: Secondary | ICD-10-CM | POA: Diagnosis not present

## 2018-02-26 DIAGNOSIS — R0600 Dyspnea, unspecified: Secondary | ICD-10-CM | POA: Diagnosis not present

## 2018-02-26 DIAGNOSIS — I10 Essential (primary) hypertension: Secondary | ICD-10-CM | POA: Diagnosis not present

## 2018-02-26 DIAGNOSIS — E538 Deficiency of other specified B group vitamins: Secondary | ICD-10-CM | POA: Diagnosis not present

## 2018-02-26 DIAGNOSIS — I119 Hypertensive heart disease without heart failure: Secondary | ICD-10-CM | POA: Diagnosis not present

## 2018-02-26 DIAGNOSIS — I2510 Atherosclerotic heart disease of native coronary artery without angina pectoris: Secondary | ICD-10-CM | POA: Diagnosis not present

## 2018-02-26 DIAGNOSIS — F325 Major depressive disorder, single episode, in full remission: Secondary | ICD-10-CM | POA: Diagnosis not present

## 2018-02-26 DIAGNOSIS — M5136 Other intervertebral disc degeneration, lumbar region: Secondary | ICD-10-CM | POA: Diagnosis not present

## 2018-02-26 DIAGNOSIS — E782 Mixed hyperlipidemia: Secondary | ICD-10-CM | POA: Diagnosis not present

## 2018-03-04 ENCOUNTER — Other Ambulatory Visit: Payer: Self-pay | Admitting: *Deleted

## 2018-03-04 ENCOUNTER — Encounter: Payer: Self-pay | Admitting: Radiation Oncology

## 2018-03-04 ENCOUNTER — Ambulatory Visit
Admission: RE | Admit: 2018-03-04 | Discharge: 2018-03-04 | Disposition: A | Discharge status: Home / Self Care | Payer: PPO | Source: Ambulatory Visit | Attending: Radiation Oncology | Admitting: Radiation Oncology

## 2018-03-04 ENCOUNTER — Other Ambulatory Visit: Payer: Self-pay

## 2018-03-04 VITALS — BP 132/68 | Temp 96.8°F | Resp 18 | Wt 103.0 lb

## 2018-03-04 DIAGNOSIS — Z08 Encounter for follow-up examination after completed treatment for malignant neoplasm: Secondary | ICD-10-CM | POA: Diagnosis not present

## 2018-03-04 DIAGNOSIS — Z85118 Personal history of other malignant neoplasm of bronchus and lung: Secondary | ICD-10-CM | POA: Insufficient documentation

## 2018-03-04 DIAGNOSIS — R918 Other nonspecific abnormal finding of lung field: Secondary | ICD-10-CM

## 2018-03-04 DIAGNOSIS — J449 Chronic obstructive pulmonary disease, unspecified: Secondary | ICD-10-CM | POA: Diagnosis not present

## 2018-03-04 DIAGNOSIS — Z923 Personal history of irradiation: Secondary | ICD-10-CM | POA: Insufficient documentation

## 2018-03-04 DIAGNOSIS — C3432 Malignant neoplasm of lower lobe, left bronchus or lung: Secondary | ICD-10-CM | POA: Diagnosis present

## 2018-03-04 NOTE — Progress Notes (Signed)
Radiation Oncology Follow up Note  Name: Robin Hudson   Date:   03/04/2018 MRN:  [id number] DOB: May 18, 1941    This 77 y.o. female presents to the clinic today for one-month follow-up status post SB RT for stage I non-small cell lung cancer the left lower lobe.  REFERRING PROVIDER: Adin Hector, MD  HPI: patient is a 77 year old female now 1 month out having completed SB RT treatment.to her left lower lobe for presumed stage I non-small cell lung cancer. Seen today in routine follow-up she is doing well. She specifically denies cough hemoptysis chest tightness or any change her primary status. She's having no dysphagia.  COMPLICATIONS OF TREATMENT: none  FOLLOW UP COMPLIANCE: keeps appointments   PHYSICAL EXAM:  BP 132/68   Temp (!) 96.8 F (36 C)   Resp 18   Wt 102 lb 15.3 oz (46.7 kg)   BMI 17.67 kg/m  Thin slightly cachectic female in NAD. Well-developed well-nourished patient in NAD. HEENT reveals PERLA, EOMI, discs not visualized.  Oral cavity is clear. No oral mucosal lesions are identified. Neck is clear without evidence of cervical or supraclavicular adenopathy. Lungs are clear to A&P. Cardiac examination is essentially unremarkable with regular rate and rhythm without murmur rub or thrill. Abdomen is benign with no organomegaly or masses noted. Motor sensory and DTR levels are equal and symmetric in the upper and lower extremities. Cranial nerves II through XII are grossly intact. Proprioception is intact. No peripheral adenopathy or edema is identified. No motor or sensory levels are noted. Crude visual fields are within normal range.  RADIOLOGY RESULTS: no current films for review  PLAN: present time patient is doing well recovering nicely without side effect or complaint from her SB RT. I've asked to see her back in 3 months with a CT scan of the chest with contrast prior to her visit. Patient knows to call with any concerns at any time.  I would like to take this  opportunity to thank you for allowing me to participate in the care of your patient.Noreene Filbert, MD

## 2018-04-03 DIAGNOSIS — J449 Chronic obstructive pulmonary disease, unspecified: Secondary | ICD-10-CM | POA: Diagnosis not present

## 2018-05-04 DIAGNOSIS — J449 Chronic obstructive pulmonary disease, unspecified: Secondary | ICD-10-CM | POA: Diagnosis not present

## 2018-05-26 DIAGNOSIS — Z0000 Encounter for general adult medical examination without abnormal findings: Secondary | ICD-10-CM | POA: Diagnosis not present

## 2018-05-26 DIAGNOSIS — R300 Dysuria: Secondary | ICD-10-CM | POA: Diagnosis not present

## 2018-05-26 DIAGNOSIS — M0579 Rheumatoid arthritis with rheumatoid factor of multiple sites without organ or systems involvement: Secondary | ICD-10-CM | POA: Diagnosis not present

## 2018-05-26 DIAGNOSIS — J439 Emphysema, unspecified: Secondary | ICD-10-CM | POA: Diagnosis not present

## 2018-05-26 DIAGNOSIS — F325 Major depressive disorder, single episode, in full remission: Secondary | ICD-10-CM | POA: Diagnosis not present

## 2018-05-26 DIAGNOSIS — M5417 Radiculopathy, lumbosacral region: Secondary | ICD-10-CM | POA: Diagnosis not present

## 2018-05-26 DIAGNOSIS — J9611 Chronic respiratory failure with hypoxia: Secondary | ICD-10-CM | POA: Diagnosis not present

## 2018-05-26 DIAGNOSIS — E538 Deficiency of other specified B group vitamins: Secondary | ICD-10-CM | POA: Diagnosis not present

## 2018-05-26 DIAGNOSIS — E782 Mixed hyperlipidemia: Secondary | ICD-10-CM | POA: Diagnosis not present

## 2018-05-26 DIAGNOSIS — I2510 Atherosclerotic heart disease of native coronary artery without angina pectoris: Secondary | ICD-10-CM | POA: Diagnosis not present

## 2018-05-26 DIAGNOSIS — I119 Hypertensive heart disease without heart failure: Secondary | ICD-10-CM | POA: Diagnosis not present

## 2018-05-26 DIAGNOSIS — I10 Essential (primary) hypertension: Secondary | ICD-10-CM | POA: Diagnosis not present

## 2018-05-26 DIAGNOSIS — C3432 Malignant neoplasm of lower lobe, left bronchus or lung: Secondary | ICD-10-CM | POA: Diagnosis not present

## 2018-05-26 DIAGNOSIS — Z85038 Personal history of other malignant neoplasm of large intestine: Secondary | ICD-10-CM | POA: Diagnosis not present

## 2018-06-03 DIAGNOSIS — J449 Chronic obstructive pulmonary disease, unspecified: Secondary | ICD-10-CM | POA: Diagnosis not present

## 2018-06-03 DIAGNOSIS — M5136 Other intervertebral disc degeneration, lumbar region: Secondary | ICD-10-CM | POA: Diagnosis not present

## 2018-06-03 DIAGNOSIS — M5416 Radiculopathy, lumbar region: Secondary | ICD-10-CM | POA: Diagnosis not present

## 2018-07-02 ENCOUNTER — Ambulatory Visit: Payer: PPO

## 2018-07-04 DIAGNOSIS — J449 Chronic obstructive pulmonary disease, unspecified: Secondary | ICD-10-CM | POA: Diagnosis not present

## 2018-07-09 ENCOUNTER — Ambulatory Visit: Payer: PPO | Admitting: Radiation Oncology

## 2018-07-09 ENCOUNTER — Other Ambulatory Visit: Payer: Self-pay | Admitting: *Deleted

## 2018-07-12 ENCOUNTER — Other Ambulatory Visit: Payer: Self-pay

## 2018-07-12 ENCOUNTER — Encounter: Payer: Self-pay | Admitting: *Deleted

## 2018-07-12 ENCOUNTER — Emergency Department
Admission: EM | Admit: 2018-07-12 | Discharge: 2018-07-12 | Disposition: A | Discharge status: Home / Self Care | Payer: PPO | Attending: Emergency Medicine | Admitting: Emergency Medicine

## 2018-07-12 ENCOUNTER — Emergency Department: Payer: PPO

## 2018-07-12 DIAGNOSIS — Z7982 Long term (current) use of aspirin: Secondary | ICD-10-CM | POA: Diagnosis not present

## 2018-07-12 DIAGNOSIS — R634 Abnormal weight loss: Secondary | ICD-10-CM | POA: Diagnosis not present

## 2018-07-12 DIAGNOSIS — F17210 Nicotine dependence, cigarettes, uncomplicated: Secondary | ICD-10-CM | POA: Diagnosis not present

## 2018-07-12 DIAGNOSIS — Z85038 Personal history of other malignant neoplasm of large intestine: Secondary | ICD-10-CM | POA: Diagnosis not present

## 2018-07-12 DIAGNOSIS — K5903 Drug induced constipation: Secondary | ICD-10-CM | POA: Diagnosis not present

## 2018-07-12 DIAGNOSIS — Z853 Personal history of malignant neoplasm of breast: Secondary | ICD-10-CM | POA: Insufficient documentation

## 2018-07-12 DIAGNOSIS — Z79899 Other long term (current) drug therapy: Secondary | ICD-10-CM | POA: Insufficient documentation

## 2018-07-12 DIAGNOSIS — I10 Essential (primary) hypertension: Secondary | ICD-10-CM | POA: Insufficient documentation

## 2018-07-12 DIAGNOSIS — Z923 Personal history of irradiation: Secondary | ICD-10-CM | POA: Diagnosis not present

## 2018-07-12 DIAGNOSIS — J449 Chronic obstructive pulmonary disease, unspecified: Secondary | ICD-10-CM | POA: Diagnosis not present

## 2018-07-12 DIAGNOSIS — T40605A Adverse effect of unspecified narcotics, initial encounter: Secondary | ICD-10-CM | POA: Diagnosis not present

## 2018-07-12 DIAGNOSIS — J45909 Unspecified asthma, uncomplicated: Secondary | ICD-10-CM | POA: Insufficient documentation

## 2018-07-12 DIAGNOSIS — K5900 Constipation, unspecified: Secondary | ICD-10-CM | POA: Diagnosis not present

## 2018-07-12 DIAGNOSIS — R109 Unspecified abdominal pain: Secondary | ICD-10-CM | POA: Diagnosis not present

## 2018-07-12 LAB — COMPREHENSIVE METABOLIC PANEL
ALBUMIN: 3.9 g/dL (ref 3.5–5.0)
ALK PHOS: 52 U/L (ref 38–126)
ALT: 9 U/L (ref 0–44)
ANION GAP: 7 (ref 5–15)
AST: 21 U/L (ref 15–41)
BUN: 13 mg/dL (ref 8–23)
CALCIUM: 8.7 mg/dL — AB (ref 8.9–10.3)
CHLORIDE: 97 mmol/L — AB (ref 98–111)
CO2: 30 mmol/L (ref 22–32)
Creatinine, Ser: 0.69 mg/dL (ref 0.44–1.00)
GFR calc non Af Amer: >60 mL/min (ref >60)
Glucose, Bld: 91 mg/dL (ref 70–99)
POTASSIUM: 4.1 mmol/L (ref 3.5–5.1)
SODIUM: 134 mmol/L — AB (ref 135–145)
Total Bilirubin: 0.6 mg/dL (ref 0.3–1.2)
Total Protein: 6.8 g/dL (ref 6.5–8.1)

## 2018-07-12 LAB — CBC WITH DIFFERENTIAL/PLATELET
BASOS PCT: 1 %
Basophils Absolute: 0 10*3/uL (ref 0–0.1)
EOS ABS: 0.1 10*3/uL (ref 0–0.7)
EOS PCT: 1 %
HCT: 38 % (ref 35.0–47.0)
Hemoglobin: 13.1 g/dL (ref 12.0–16.0)
LYMPHS ABS: 0.9 10*3/uL — AB (ref 1.0–3.6)
Lymphocytes Relative: 14 %
MCH: 29.7 pg (ref 26.0–34.0)
MCHC: 34.4 g/dL (ref 32.0–36.0)
MCV: 86.4 fL (ref 80.0–100.0)
MONO ABS: 0.3 10*3/uL (ref 0.2–0.9)
Monocytes Relative: 5 %
Neutro Abs: 5.2 10*3/uL (ref 1.4–6.5)
Neutrophils Relative %: 79 %
PLATELETS: 269 10*3/uL (ref 150–440)
RBC: 4.39 MIL/uL (ref 3.80–5.20)
RDW: 13.8 % (ref 11.5–14.5)
WBC: 6.6 10*3/uL (ref 3.6–11.0)

## 2018-07-12 LAB — LIPASE, BLOOD: Lipase: 30 U/L (ref 11–51)

## 2018-07-12 MED ORDER — DOCUSATE SODIUM 50 MG/5ML PO LIQD
50.0000 mg | Freq: Once | ORAL | Status: AC
  Administered 2018-07-12: 50 mg
  Filled 2018-07-12: qty 10

## 2018-07-12 NOTE — ED Triage Notes (Signed)
Pt c/o constipation x 1 week. Pt states her abdomen is swollen and when she tries to have a BM very small amount is resulted. Pt states she is taking a stool softener and it has made her stools softer, but not increased amount. Pt has taken a laxative once and an enema several days ago. Pt denies n/v but complains of decreased appetite. Pt states she has the urge to have a BM every time she stands but has been unable.

## 2018-07-12 NOTE — ED Notes (Signed)
Pt had large bowel movement.

## 2018-07-12 NOTE — Discharge Instructions (Addendum)
Constipation: Take colace twice a day everyday. Take senna once a day at bedtime. Take daily probiotics. Drink plenty of fluids and eat a diet rich in fiber. If you go more than 3 days without a bowel movement, take 1 cap full of Miralax in the morning and one in the evening up to 5 days.   Make sure to call your GI doctor tomorrow morning to have a colonoscopy scheduled to make sure that the colon cancer has not returned.  Return to the emergency room if you have new or worsening abdominal pain, vomiting, or abdominal distention

## 2018-07-12 NOTE — ED Provider Notes (Signed)
[facility] Emergency Department Provider Note  ____________________________________________  Time seen: Approximately 4:25 PM  I have reviewed the triage vital signs and the nursing notes.   HISTORY  Chief Complaint Constipation   HPI Robin Hudson is a 77 y.o. female with a history of colon cancer status post partial colectomy in 2016, chronic back pain on narcotics and several abdominal surgeries including hysterectomy and appendectomy who presents for evaluation of constipation.  Patient reports that for the last week she has had very small bowel movements.  She reports that her stool is softer since being started on a stool softener.  She has noted some bloating and has had intermittent sharp abdominal pain.  Patient also endorses a 40 pound weight loss over the last 7 to 8 months.  She reports that she was told by her doctor that is because she is not eating but patient thinks that she eats plenty.  She is passing gas and denies prior history of SBO.  She denies nausea or vomiting, fever or chills.  Past Medical History:  Diagnosis Date  . Allergic rhinitis   . Anxiety   . Asthma   . B12 deficiency   . Breast cancer ([place]) 1999   RT LUMPECTOMY  . Carcinoma of right breast treated with adjuvant chemotherapy ([place]) 1999   RT LUMPECTOMY  . CHF (congestive heart failure) ([place])   . Colon cancer ([place]) 2010  . Colon cancer ([place])   . COPD (chronic obstructive pulmonary disease) ([place])   . Hyperlipemia   . Hypertension   . MRSA pneumonia ([place])   . Osteoporosis   . Radiation 1999   BREAST CA  . Right adrenal mass [facility])     Patient Active Problem List   Diagnosis Date Noted  . Leukocytosis 12/29/2016  . Cough 12/29/2016  . Hypoxia 12/06/2015  . COPD exacerbation ([place]) 12/03/2015  . Elevated troponin 12/03/2015  . Elevated antinuclear antibody (ANA) level 05/15/2015  . Shortness of breath 05/12/2015  . Hyperlipemia 05/12/2015  . COPD (chronic  obstructive pulmonary disease) ([place]) 05/12/2015  . HTN (hypertension) 05/12/2015  . Osteoporosis 05/12/2015  . Right adrenal mass ([place]) 05/12/2015  . B12 deficiency 05/12/2015  . Allergic rhinitis 05/12/2015  . Breast cancer ([place]) 05/12/2015  . Colon cancer ([place]) 05/12/2015  . Closed head injury 12/15/2014    Past Surgical History:  Procedure Laterality Date  . ABDOMINAL HYSTERECTOMY    . APPENDECTOMY    . BREAST SURGERY Right    lumpectomy x2 with lymph nodes  . CARPAL TUNNEL RELEASE Left 06/04/2016   Procedure: CARPAL TUNNEL RELEASE;  Surgeon: Hessie Knows, MD;  Location: [facility] ORS;  Service: Orthopedics;  Laterality: Left;  . colectomy     partial  . IMAGE GUIDED SINUS SURGERY    . OPEN REDUCTION INTERNAL FIXATION (ORIF) DISTAL RADIAL FRACTURE Left 06/04/2016   Procedure: OPEN REDUCTION INTERNAL FIXATION (ORIF) DISTAL RADIAL FRACTURE;  Surgeon: Hessie Knows, MD;  Location: [facility] ORS;  Service: Orthopedics;  Laterality: Left;    Prior to Admission medications   Medication Sig Start Date End Date Taking? Authorizing Provider  albuterol (PROAIR HFA) 108 (90 Base) MCG/ACT inhaler INHALE 2 PUFFS BY MOUTH INTO THE LUNGS EVERY 6 HOURS AS NEEDED FOR WHEEZING 10/31/17   [provider]  albuterol (PROVENTIL) (2.5 MG/3ML) 0.083% nebulizer solution Take 2.5 mg by nebulization every 6 (six) hours as needed for wheezing or shortness of breath.    [provider]  ALPRAZolam Duanne Moron) 0.5  MG tablet Take 1 tablet by mouth 2 (two) times daily as needed for anxiety.  04/19/15   [provider]  aspirin EC 81 MG tablet Take 1 tablet (81 mg total) by mouth daily. 06/28/16   Fritzi Mandes, MD  atenolol (TENORMIN) 50 MG tablet Take 1 tablet (50 mg total) by mouth daily. Patient taking differently: Take 25 mg by mouth daily.  05/18/15   Tama High III, MD  azelastine (ASTELIN) 0.1 % nasal spray Place 1 spray into both nostrils 2 (two) times daily. Use in each nostril as directed     [provider]  butalbital-acetaminophen-caffeine (FIORICET, ESGIC) 50-325-40 MG tablet TK 1 T PO  TID PRF HEADACHE 12/19/17   [provider]  cholecalciferol (VITAMIN D) 1000 units tablet Take 1,000 Units by mouth daily.    [provider]  cyanocobalamin (,VITAMIN B-12,) 1000 MCG/ML injection Inject into the muscle. 03/26/16   [provider]  Fluticasone-Salmeterol (ADVAIR DISKUS) 250-50 MCG/DOSE AEPB INHALE 1 PUFF INTO THE LUNGS EVERY 12 HOURS 12/22/17   [provider]  gabapentin (NEURONTIN) 100 MG capsule Take by mouth. 10/17/17 10/17/18  [provider]  HYDROcodone-acetaminophen ([place]) 7.5-325 MG tablet Take by mouth. 12/09/17   [provider]  hydroxychloroquine (PLAQUENIL) 200 MG tablet Take 400 mg by mouth daily. Reported on 12/06/2015    [provider]  losartan (COZAAR) 50 MG tablet Take 1 tablet (50 mg total) by mouth daily. 05/18/15   Adin Hector, MD  meclizine (ANTIVERT) 12.5 MG tablet Take 1 tablet (12.5 mg total) by mouth 3 (three) times daily as needed for dizziness. 09/08/16   Merlyn Lot, MD  montelukast (SINGULAIR) 10 MG tablet Take 10 mg by mouth at bedtime.    [provider]  pantoprazole (PROTONIX) 20 MG tablet Take 1 tablet (20 mg total) 2 (two) times daily by mouth. 09/23/17 03/04/18  Merlyn Lot, MD  polyethylene glycol [facility] / Floria Raveling) packet Take 17 g daily by mouth. Mix one tablespoon with 8oz of your favorite juice or water every day until you are having soft formed stools. Then start taking once daily if you didn't have a stool the day before. 09/23/17   Merlyn Lot, MD  SPIRIVA HANDIHALER 18 MCG inhalation capsule Take 1 Inhaler by mouth daily. 04/24/15   [provider]  tiZANidine (ZANAFLEX) 2 MG tablet 1-2 po bid prn 10/09/17   [provider]  traMADol (ULTRAM) 50 MG tablet Take 50 mg by mouth every 6 (six) hours as needed.    [provider]    Allergies Ace inhibitors; Alendronate; Coreg [carvedilol]; Duloxetine; Morphine; Pregabalin; Statins; and Verapamil  Family History  Problem Relation Age of Onset  . Heart attack Mother   . Breast cancer Other   . Breast cancer Sister 76  . Breast cancer Sister 74    Social History Social History   Tobacco Use  . Smoking status: Current Some Day Smoker    Packs/day: 0.25    Types: Cigarettes  . Smokeless tobacco: Never Used  Substance Use Topics  . Alcohol use: No  . Drug use: No    Review of Systems  Constitutional: Negative for fever. Eyes: Negative for visual changes. ENT: Negative for sore throat. Neck: No neck pain  Cardiovascular: Negative for chest pain. Respiratory: Negative for shortness of breath. Gastrointestinal: + abdominal pain and constipation. No vomiting or diarrhea. Genitourinary: Negative for dysuria. Musculoskeletal: Negative for back pain. Skin: Negative for rash. Neurological:  Negative for headaches, weakness or numbness. Psych: No SI or HI  ____________________________________________   PHYSICAL EXAM:  VITAL SIGNS: ED Triage Vitals  Enc Vitals Group     BP 07/12/18 1552 (!) 149/74     Pulse Rate 07/12/18 1552 78     Resp 07/12/18 1552 (!) 24     Temp 07/12/18 1552 98.2 F (36.8 C)     Temp Source 07/12/18 1552 Oral     SpO2 07/12/18 1552 97 %     Weight 07/12/18 1551 100 lb (45.4 kg)     Height 07/12/18 1551 5\' 4"  (1.626 m)     Head Circumference --      Peak Flow --      Pain Score 07/12/18 1550 0     Pain Loc --      Pain Edu? --      Excl. in [place]? --     Constitutional: Alert and oriented. Well appearing and in no apparent distress. HEENT:      Head: Normocephalic and atraumatic.         Eyes: Conjunctivae are normal. Sclera is non-icteric.       Mouth/Throat: Mucous membranes are moist.       Neck: Supple with no signs of meningismus. Cardiovascular: Regular rate and rhythm. No murmurs, gallops, or  rubs. 2+ symmetrical distal pulses are present in all extremities. No JVD. Respiratory: Normal respiratory effort. Lungs are clear to auscultation bilaterally. No wheezes, crackles, or rhonchi.  Gastrointestinal: Soft, mild diffuse tenderness to palpationnon tender, non distended with positive bowel sounds. No rebound or guarding. Genitourinary: No CVA tenderness. Musculoskeletal: Nontender with normal range of motion in all extremities. No edema, cyanosis, or erythema of extremities. Neurologic: Normal speech and language. Face is symmetric. Moving all extremities. No gross focal neurologic deficits are appreciated. Skin: Skin is warm, dry and intact. No rash noted. Psychiatric: Mood and affect are normal. Speech and behavior are normal.  ____________________________________________   LABS (all labs ordered are listed, but only abnormal results are displayed)  Labs Reviewed  CBC WITH DIFFERENTIAL/PLATELET - Abnormal; Notable for the following components:      Result Value   Lymphs Abs 0.9 (*)    All other components within normal limits  COMPREHENSIVE METABOLIC PANEL - Abnormal; Notable for the following components:   Sodium 134 (*)    Chloride 97 (*)    Calcium 8.7 (*)    All other components within normal limits  LIPASE, BLOOD  URINALYSIS, COMPLETE (UACMP) WITH MICROSCOPIC   ____________________________________________  EKG  none ____________________________________________  RADIOLOGY  I have personally reviewed the images performed during this visit and I agree with the Radiologist's read.   Interpretation by Radiologist:  Dg Abdomen Acute W/chest  Result Date: 07/12/2018 CLINICAL DATA:  Abdominal pain, constipation, history CHF, colon cancer, breast cancer, asthma, hypertension EXAM: DG ABDOMEN ACUTE W/ 1V CHEST COMPARISON:  CT chest 10/30/2017, CT abdomen and pelvis 09/25/2017 FINDINGS: Normal heart size, mediastinal contours, and pulmonary vascularity. Biapical  scarring much greater on RIGHT unchanged. Underlying emphysematous changes with chronic elevation of the hila. No acute infiltrate, pleural effusion or pneumothorax. Atherosclerotic calcification aorta. Gas and stool throughout colon to rectum. Nonobstructive bowel gas pattern. No bowel dilatation, bowel wall thickening, or free air. Bones demineralized. Scattered surgical clips in abdomen. No urinary tract calcifications. IMPRESSION: Changes of COPD with BILATERAL upper lobe scarring and volume loss greater on RIGHT. No acute abdominal findings. Electronically Signed   By: Crist Infante.D.  On: 07/12/2018 16:58      ____________________________________________   PROCEDURES  Procedure(s) performed: None Procedures Critical Care performed:  None ____________________________________________   INITIAL IMPRESSION / ASSESSMENT AND PLAN / ED COURSE  77 y.o. female with a history of colon cancer status post partial colectomy in 2016, chronic back pain on narcotics and several abdominal surgeries including hysterectomy and appendectomy who presents for evaluation of constipation x 1 week.  Patient is well-appearing, no distress, she has normal vital signs, her abdomen is flat, nondistended, positive bowel sounds, she has mild diffuse tenderness with no localized guarding or rebound.  Differential diagnoses including constipation especially in the setting of chronic opiate use, however recurrent colon cancer is also of concern especially with a 40 pound weight loss over the last several months.  Patient does not recall when her last colonoscopy was done but she says it was done here and there is no record of it therefore I am concerned he might have been several years ago.  She did have a CT abdomen pelvis with contrast in November 2018 with did not show any masses.  However I would not expect a small intraluminal mass to appear on CT scan. KUB showing gas and stool in the colon with no signs of  obstruction. Will check labs and give an enema. If patient gets relief from enema will refer back to GI for colonoscopy to rule out recurrent malignancy.  Clinical Course as of Jul 12 1853  Nancy Fetter Jul 12, 2018  1831 Patient received an enema and had a large bowel movement with relief of her symptoms.  At this time I will discharge her home on senna and Colace daily and close follow-up with her GI doctor for repeat colonoscopy.  Discussed return precautions for nausea, vomiting, distention, or not passing flatus.   [CV]    Clinical Course User Index [CV] Alfred Levins [place], MD     As part of my medical decision making, I reviewed the following data within the [place] notes reviewed and incorporated, Labs reviewed , EKG interpreted , Old chart reviewed, Radiograph reviewed , Notes from prior ED visits and [place] Controlled Substance Database    Pertinent labs & imaging results that were available during my care of the patient were reviewed by me and considered in my medical decision making (see chart for details).    ____________________________________________   FINAL CLINICAL IMPRESSION(S) / ED DIAGNOSES  Final diagnoses:  Drug-induced constipation      NEW MEDICATIONS STARTED DURING THIS VISIT:  ED Discharge Orders    None       Note:  This document was prepared using Dragon voice recognition software and may include unintentional dictation errors.    Alfred Levins, [place], MD 07/12/18 [phone]

## 2018-07-15 ENCOUNTER — Ambulatory Visit: Admission: RE | Admit: 2018-07-15 | Payer: PPO | Source: Ambulatory Visit

## 2018-07-22 ENCOUNTER — Ambulatory Visit: Payer: PPO | Attending: Radiation Oncology | Admitting: Radiation Oncology

## 2018-07-29 DIAGNOSIS — E538 Deficiency of other specified B group vitamins: Secondary | ICD-10-CM | POA: Diagnosis not present

## 2018-07-29 DIAGNOSIS — J441 Chronic obstructive pulmonary disease with (acute) exacerbation: Secondary | ICD-10-CM | POA: Diagnosis not present

## 2018-07-29 DIAGNOSIS — C3432 Malignant neoplasm of lower lobe, left bronchus or lung: Secondary | ICD-10-CM | POA: Diagnosis not present

## 2018-08-04 DIAGNOSIS — J449 Chronic obstructive pulmonary disease, unspecified: Secondary | ICD-10-CM | POA: Diagnosis not present

## 2018-08-08 ENCOUNTER — Other Ambulatory Visit: Payer: Self-pay

## 2018-08-08 ENCOUNTER — Encounter: Payer: Self-pay | Admitting: Emergency Medicine

## 2018-08-08 ENCOUNTER — Inpatient Hospital Stay
Admission: EM | Admit: 2018-08-08 | Discharge: 2018-08-10 | DRG: 190 | Disposition: A | Discharge status: Home Health | Payer: PPO | Attending: Internal Medicine | Admitting: Internal Medicine

## 2018-08-08 ENCOUNTER — Emergency Department: Payer: PPO

## 2018-08-08 DIAGNOSIS — Z853 Personal history of malignant neoplasm of breast: Secondary | ICD-10-CM

## 2018-08-08 DIAGNOSIS — Z9221 Personal history of antineoplastic chemotherapy: Secondary | ICD-10-CM

## 2018-08-08 DIAGNOSIS — Z7951 Long term (current) use of inhaled steroids: Secondary | ICD-10-CM

## 2018-08-08 DIAGNOSIS — R531 Weakness: Secondary | ICD-10-CM | POA: Diagnosis not present

## 2018-08-08 DIAGNOSIS — Z79899 Other long term (current) drug therapy: Secondary | ICD-10-CM

## 2018-08-08 DIAGNOSIS — Z9981 Dependence on supplemental oxygen: Secondary | ICD-10-CM

## 2018-08-08 DIAGNOSIS — Z8614 Personal history of Methicillin resistant Staphylococcus aureus infection: Secondary | ICD-10-CM

## 2018-08-08 DIAGNOSIS — R0602 Shortness of breath: Secondary | ICD-10-CM | POA: Diagnosis not present

## 2018-08-08 DIAGNOSIS — R918 Other nonspecific abnormal finding of lung field: Secondary | ICD-10-CM | POA: Diagnosis not present

## 2018-08-08 DIAGNOSIS — F17210 Nicotine dependence, cigarettes, uncomplicated: Secondary | ICD-10-CM | POA: Diagnosis not present

## 2018-08-08 DIAGNOSIS — M810 Age-related osteoporosis without current pathological fracture: Secondary | ICD-10-CM | POA: Diagnosis present

## 2018-08-08 DIAGNOSIS — I110 Hypertensive heart disease with heart failure: Secondary | ICD-10-CM | POA: Diagnosis present

## 2018-08-08 DIAGNOSIS — C3490 Malignant neoplasm of unspecified part of unspecified bronchus or lung: Secondary | ICD-10-CM | POA: Diagnosis not present

## 2018-08-08 DIAGNOSIS — Z7952 Long term (current) use of systemic steroids: Secondary | ICD-10-CM

## 2018-08-08 DIAGNOSIS — Z923 Personal history of irradiation: Secondary | ICD-10-CM

## 2018-08-08 DIAGNOSIS — Z888 Allergy status to other drugs, medicaments and biological substances status: Secondary | ICD-10-CM

## 2018-08-08 DIAGNOSIS — J209 Acute bronchitis, unspecified: Secondary | ICD-10-CM | POA: Diagnosis present

## 2018-08-08 DIAGNOSIS — I10 Essential (primary) hypertension: Secondary | ICD-10-CM | POA: Diagnosis not present

## 2018-08-08 DIAGNOSIS — Z885 Allergy status to narcotic agent status: Secondary | ICD-10-CM

## 2018-08-08 DIAGNOSIS — Z7982 Long term (current) use of aspirin: Secondary | ICD-10-CM | POA: Diagnosis not present

## 2018-08-08 DIAGNOSIS — J9601 Acute respiratory failure with hypoxia: Secondary | ICD-10-CM | POA: Diagnosis not present

## 2018-08-08 DIAGNOSIS — J440 Chronic obstructive pulmonary disease with acute lower respiratory infection: Secondary | ICD-10-CM | POA: Diagnosis not present

## 2018-08-08 DIAGNOSIS — J441 Chronic obstructive pulmonary disease with (acute) exacerbation: Principal | ICD-10-CM | POA: Diagnosis present

## 2018-08-08 DIAGNOSIS — Z85038 Personal history of other malignant neoplasm of large intestine: Secondary | ICD-10-CM

## 2018-08-08 DIAGNOSIS — I509 Heart failure, unspecified: Secondary | ICD-10-CM | POA: Diagnosis not present

## 2018-08-08 DIAGNOSIS — J9621 Acute and chronic respiratory failure with hypoxia: Secondary | ICD-10-CM | POA: Diagnosis not present

## 2018-08-08 DIAGNOSIS — E785 Hyperlipidemia, unspecified: Secondary | ICD-10-CM | POA: Diagnosis present

## 2018-08-08 DIAGNOSIS — K219 Gastro-esophageal reflux disease without esophagitis: Secondary | ICD-10-CM | POA: Diagnosis present

## 2018-08-08 DIAGNOSIS — Z716 Tobacco abuse counseling: Secondary | ICD-10-CM | POA: Diagnosis not present

## 2018-08-08 DIAGNOSIS — J9620 Acute and chronic respiratory failure, unspecified whether with hypoxia or hypercapnia: Secondary | ICD-10-CM | POA: Diagnosis not present

## 2018-08-08 LAB — URINALYSIS, COMPLETE (UACMP) WITH MICROSCOPIC
Bilirubin Urine: NEGATIVE
Glucose, UA: NEGATIVE mg/dL
HGB URINE DIPSTICK: NEGATIVE
Ketones, ur: NEGATIVE mg/dL
LEUKOCYTES UA: NEGATIVE
NITRITE: NEGATIVE
PROTEIN: NEGATIVE mg/dL
Specific Gravity, Urine: 1.016 (ref 1.005–1.030)
pH: 7 (ref 5.0–8.0)

## 2018-08-08 LAB — CBC
HCT: 39.2 % (ref 35.0–47.0)
Hemoglobin: 13.4 g/dL (ref 12.0–16.0)
MCH: 29.8 pg (ref 26.0–34.0)
MCHC: 34.2 g/dL (ref 32.0–36.0)
MCV: 87 fL (ref 80.0–100.0)
PLATELETS: 283 10*3/uL (ref 150–440)
RBC: 4.51 MIL/uL (ref 3.80–5.20)
RDW: 14.3 % (ref 11.5–14.5)
WBC: 7.6 10*3/uL (ref 3.6–11.0)

## 2018-08-08 LAB — BASIC METABOLIC PANEL
Anion gap: 10 (ref 5–15)
BUN: 14 mg/dL (ref 8–23)
CALCIUM: 9.2 mg/dL (ref 8.9–10.3)
CHLORIDE: 96 mmol/L — AB (ref 98–111)
CO2: 31 mmol/L (ref 22–32)
CREATININE: 0.72 mg/dL (ref 0.44–1.00)
GFR calc non Af Amer: >60 mL/min (ref >60)
GLUCOSE: 115 mg/dL — AB (ref 70–99)
Potassium: 3.5 mmol/L (ref 3.5–5.1)
Sodium: 137 mmol/L (ref 135–145)

## 2018-08-08 LAB — MRSA PCR SCREENING: MRSA by PCR: NEGATIVE

## 2018-08-08 LAB — TROPONIN I: Troponin I: <0.03 ng/mL (ref <0.03)

## 2018-08-08 MED ORDER — PREDNISONE 20 MG PO TABS
60.0000 mg | ORAL_TABLET | Freq: Once | ORAL | Status: AC
  Administered 2018-08-08: 60 mg via ORAL
  Filled 2018-08-08: qty 3

## 2018-08-08 MED ORDER — METHYLPREDNISOLONE SODIUM SUCC 125 MG IJ SOLR
60.0000 mg | Freq: Three times a day (TID) | INTRAMUSCULAR | Status: DC
  Administered 2018-08-08 – 2018-08-10 (×6): 60 mg via INTRAVENOUS
  Filled 2018-08-08 (×6): qty 2

## 2018-08-08 MED ORDER — GUAIFENESIN-DM 100-10 MG/5ML PO SYRP: 5.0000 mL | ORAL_SOLUTION | ORAL | Status: DC | PRN

## 2018-08-08 MED ORDER — MECLIZINE HCL 12.5 MG PO TABS
12.5000 mg | ORAL_TABLET | Freq: Three times a day (TID) | ORAL | Status: DC | PRN
  Filled 2018-08-08: qty 1

## 2018-08-08 MED ORDER — ALPRAZOLAM 0.5 MG PO TABS
0.5000 mg | ORAL_TABLET | Freq: Two times a day (BID) | ORAL | Status: DC | PRN
  Administered 2018-08-09 – 2018-08-10 (×3): 0.5 mg via ORAL
  Filled 2018-08-08 (×4): qty 1

## 2018-08-08 MED ORDER — HYDROCODONE-ACETAMINOPHEN 5-325 MG PO TABS
1.0000 | ORAL_TABLET | Freq: Once | ORAL | Status: AC
  Administered 2018-08-08: 1 via ORAL
  Filled 2018-08-08: qty 1

## 2018-08-08 MED ORDER — IOHEXOL 350 MG/ML SOLN
75.0000 mL | Freq: Once | INTRAVENOUS | Status: AC | PRN
  Administered 2018-08-08: 75 mL via INTRAVENOUS

## 2018-08-08 MED ORDER — BUTALBITAL-APAP-CAFFEINE 50-325-40 MG PO TABS: 1.0000 | ORAL_TABLET | Freq: Four times a day (QID) | ORAL | Status: DC | PRN

## 2018-08-08 MED ORDER — ATENOLOL 50 MG PO TABS
50.0000 mg | ORAL_TABLET | Freq: Every day | ORAL | Status: DC
  Administered 2018-08-09 – 2018-08-10 (×2): 50 mg via ORAL
  Filled 2018-08-08 (×3): qty 1

## 2018-08-08 MED ORDER — VITAMIN D 1000 UNITS PO TABS
1000.0000 [IU] | ORAL_TABLET | Freq: Every day | ORAL | Status: DC
  Administered 2018-08-09 – 2018-08-10 (×2): 1000 [IU] via ORAL
  Filled 2018-08-08 (×2): qty 1

## 2018-08-08 MED ORDER — LOSARTAN POTASSIUM 50 MG PO TABS
50.0000 mg | ORAL_TABLET | Freq: Every day | ORAL | Status: DC
  Administered 2018-08-09 – 2018-08-10 (×2): 50 mg via ORAL
  Filled 2018-08-08 (×2): qty 1

## 2018-08-08 MED ORDER — ASPIRIN EC 81 MG PO TBEC
81.0000 mg | DELAYED_RELEASE_TABLET | Freq: Every day | ORAL | Status: DC
  Administered 2018-08-08 – 2018-08-10 (×3): 81 mg via ORAL
  Filled 2018-08-08 (×3): qty 1

## 2018-08-08 MED ORDER — AZELASTINE HCL 0.1 % NA SOLN
1.0000 | Freq: Two times a day (BID) | NASAL | Status: DC
  Administered 2018-08-08 – 2018-08-09 (×2): 1 via NASAL
  Filled 2018-08-08: qty 30

## 2018-08-08 MED ORDER — HYDROCODONE-ACETAMINOPHEN 7.5-325 MG PO TABS
1.0000 | ORAL_TABLET | Freq: Three times a day (TID) | ORAL | Status: DC | PRN
  Administered 2018-08-08 – 2018-08-10 (×5): 1 via ORAL
  Filled 2018-08-08 (×5): qty 1

## 2018-08-08 MED ORDER — HYDROXYCHLOROQUINE SULFATE 200 MG PO TABS
400.0000 mg | ORAL_TABLET | Freq: Every day | ORAL | Status: DC
  Administered 2018-08-08 – 2018-08-10 (×3): 400 mg via ORAL
  Filled 2018-08-08 (×4): qty 2

## 2018-08-08 MED ORDER — POLYETHYLENE GLYCOL 3350 17 G PO PACK
17.0000 g | PACK | Freq: Every day | ORAL | Status: DC
  Administered 2018-08-09 – 2018-08-10 (×2): 17 g via ORAL
  Filled 2018-08-08 (×2): qty 1

## 2018-08-08 MED ORDER — PANTOPRAZOLE SODIUM 20 MG PO TBEC
20.0000 mg | DELAYED_RELEASE_TABLET | Freq: Two times a day (BID) | ORAL | Status: DC
  Administered 2018-08-08 – 2018-08-10 (×4): 20 mg via ORAL
  Filled 2018-08-08 (×5): qty 1

## 2018-08-08 MED ORDER — IPRATROPIUM-ALBUTEROL 0.5-2.5 (3) MG/3ML IN SOLN
3.0000 mL | Freq: Once | RESPIRATORY_TRACT | Status: AC
  Administered 2018-08-08: 3 mL via RESPIRATORY_TRACT
  Filled 2018-08-08: qty 3

## 2018-08-08 MED ORDER — NICOTINE 21 MG/24HR TD PT24
21.0000 mg | MEDICATED_PATCH | Freq: Every day | TRANSDERMAL | Status: DC
  Filled 2018-08-08 (×2): qty 1

## 2018-08-08 MED ORDER — IPRATROPIUM-ALBUTEROL 0.5-2.5 (3) MG/3ML IN SOLN
3.0000 mL | Freq: Four times a day (QID) | RESPIRATORY_TRACT | Status: DC
  Administered 2018-08-08 – 2018-08-10 (×7): 3 mL via RESPIRATORY_TRACT
  Filled 2018-08-08 (×7): qty 3
  Filled 2018-08-08: qty 39

## 2018-08-08 MED ORDER — SODIUM CHLORIDE 0.9 % IV BOLUS
500.0000 mL | Freq: Once | INTRAVENOUS | Status: AC
  Administered 2018-08-08: 500 mL via INTRAVENOUS

## 2018-08-08 MED ORDER — ALBUTEROL SULFATE (2.5 MG/3ML) 0.083% IN NEBU: 2.5000 mg | INHALATION_SOLUTION | Freq: Four times a day (QID) | RESPIRATORY_TRACT | Status: DC | PRN

## 2018-08-08 MED ORDER — SODIUM CHLORIDE 0.9 % IV SOLN
1.0000 g | INTRAVENOUS | Status: DC
  Administered 2018-08-08 – 2018-08-09 (×2): 1 g via INTRAVENOUS
  Filled 2018-08-08 (×2): qty 1
  Filled 2018-08-08: qty 10

## 2018-08-08 MED ORDER — CYANOCOBALAMIN 1000 MCG/ML IJ SOLN: 1000.0000 ug | INTRAMUSCULAR | Status: DC

## 2018-08-08 NOTE — ED Triage Notes (Addendum)
Pt arrived via POV with family with reports of increased shortness of breath x 2-3 days and weakness for the same time period.  Pt wears 2.5L continuous and states her SOB is worse than usual. Denies vomiting or diarrhea, son states that she has been c/o being sick on her stomach.  Pt states she took a pain pill for her back this morning around 0600. Pt states she has recently been on prednisone for back pain, but finished the taper.

## 2018-08-08 NOTE — ED Triage Notes (Signed)
FIRST NURSE NOTE-here for [facility]. Labored at check in. On home O2. On abx for PNA but getting worse. Room saved for pt

## 2018-08-08 NOTE — ED Provider Notes (Signed)
[facility] Emergency Department Provider Note    First MD Initiated Contact with Patient 08/08/18 1155     (approximate)  I have reviewed the triage vital signs and the nursing notes.   HISTORY  Chief Complaint Shortness of Breath and Weakness    HPI Robin Hudson is a 77 y.o. female history of COPD emphysema remote history of lung cancer as well as some congestive heart failure presents with worsening shortness of breath exertional dyspnea requiring increased O2 at home.  Patient typically only requires oxygen at night and is been having to use it throughout the day.  Was recently started on doxycycline for pneumonia.  Is still having a dry cough.  Denies any fevers.  No chest pain.  Denies any leg swelling.    Past Medical History:  Diagnosis Date  . Allergic rhinitis   . Anxiety   . Asthma   . B12 deficiency   . Breast cancer ([place]) 1999   RT LUMPECTOMY  . Carcinoma of right breast treated with adjuvant chemotherapy ([place]) 1999   RT LUMPECTOMY  . CHF (congestive heart failure) ([place])   . Colon cancer ([place]) 2010  . Colon cancer ([place])   . COPD (chronic obstructive pulmonary disease) ([place])   . Hyperlipemia   . Hypertension   . MRSA pneumonia ([place])   . Osteoporosis   . Radiation 1999   BREAST CA  . Right adrenal mass [facility])    Family History  Problem Relation Age of Onset  . Heart attack Mother   . Breast cancer Other   . Breast cancer Sister 64  . Breast cancer Sister 74   Past Surgical History:  Procedure Laterality Date  . ABDOMINAL HYSTERECTOMY    . APPENDECTOMY    . BREAST SURGERY Right    lumpectomy x2 with lymph nodes  . CARPAL TUNNEL RELEASE Left 06/04/2016   Procedure: CARPAL TUNNEL RELEASE;  Surgeon: Hessie Knows, MD;  Location: [facility] ORS;  Service: Orthopedics;  Laterality: Left;  . colectomy     partial  . IMAGE GUIDED SINUS SURGERY    . OPEN REDUCTION INTERNAL FIXATION (ORIF) DISTAL RADIAL FRACTURE Left 06/04/2016   Procedure: OPEN REDUCTION INTERNAL FIXATION (ORIF) DISTAL RADIAL FRACTURE;  Surgeon: Hessie Knows, MD;  Location: [facility] ORS;  Service: Orthopedics;  Laterality: Left;   Patient Active Problem List   Diagnosis Date Noted  . Leukocytosis 12/29/2016  . Cough 12/29/2016  . Hypoxia 12/06/2015  . COPD exacerbation ([place]) 12/03/2015  . Elevated troponin 12/03/2015  . Elevated antinuclear antibody (ANA) level 05/15/2015  . Shortness of breath 05/12/2015  . Hyperlipemia 05/12/2015  . COPD (chronic obstructive pulmonary disease) ([place]) 05/12/2015  . HTN (hypertension) 05/12/2015  . Osteoporosis 05/12/2015  . Right adrenal mass ([place]) 05/12/2015  . B12 deficiency 05/12/2015  . Allergic rhinitis 05/12/2015  . Breast cancer ([place]) 05/12/2015  . Colon cancer ([place]) 05/12/2015  . Closed head injury 12/15/2014      Prior to Admission medications   Medication Sig Start Date End Date Taking? Authorizing Provider  albuterol (PROAIR HFA) 108 (90 Base) MCG/ACT inhaler INHALE 2 PUFFS BY MOUTH INTO THE LUNGS EVERY 6 HOURS AS NEEDED FOR WHEEZING 10/31/17  Yes [provider]  albuterol (PROVENTIL) (2.5 MG/3ML) 0.083% nebulizer solution Take 2.5 mg by nebulization every 6 (six) hours as needed for wheezing or shortness of breath.   Yes [provider]  ALPRAZolam Duanne Moron) 0.5 MG tablet Take 1 tablet by mouth 2 (two) times daily  as needed for anxiety.  04/19/15  Yes [provider]  aspirin EC 81 MG tablet Take 1 tablet (81 mg total) by mouth daily. 06/28/16  Yes Fritzi Mandes, MD  atenolol (TENORMIN) 50 MG tablet Take 1 tablet (50 mg total) by mouth daily. Patient taking differently: Take 25 mg by mouth daily.  05/18/15  Yes Tama High III, MD  azelastine (ASTELIN) 0.1 % nasal spray Place 1 spray into both nostrils 2 (two) times daily. Use in each nostril as directed   Yes [provider]  cholecalciferol (VITAMIN D) 1000 units tablet Take 1,000 Units by mouth daily.   Yes  [provider]  cyanocobalamin (,VITAMIN B-12,) 1000 MCG/ML injection Inject 1,000 mcg into the muscle every 30 (thirty) days.  03/26/16  Yes [provider]  Fluticasone-Salmeterol (ADVAIR DISKUS) 250-50 MCG/DOSE AEPB INHALE 1 PUFF INTO THE LUNGS EVERY 12 HOURS 12/22/17  Yes [provider]  HYDROcodone-acetaminophen (NORCO) 7.5-325 MG tablet Take 1 tablet by mouth 3 (three) times daily as needed for moderate pain.   Yes [provider]  hydroxychloroquine (PLAQUENIL) 200 MG tablet Take 400 mg by mouth daily.   Yes [provider]  losartan (COZAAR) 50 MG tablet Take 1 tablet (50 mg total) by mouth daily. 05/18/15  Yes Tama High III, MD  predniSONE (DELTASONE) 10 MG tablet Take 10 mg by mouth daily with breakfast.   Yes [provider]  butalbital-acetaminophen-caffeine (FIORICET, ESGIC) 50-325-40 MG tablet Take 1 tablet by mouth every 6 (six) hours as needed.  12/19/17   [provider]  meclizine (ANTIVERT) 12.5 MG tablet Take 1 tablet (12.5 mg total) by mouth 3 (three) times daily as needed for dizziness. Patient not taking: Reported on 08/08/2018 09/08/16   Merlyn Lot, MD  pantoprazole (PROTONIX) 20 MG tablet Take 1 tablet (20 mg total) 2 (two) times daily by mouth. 09/23/17 03/04/18  Merlyn Lot, MD  polyethylene glycol [facility] Floria Raveling) packet Take 17 g daily by mouth. Mix one tablespoon with 8oz of your favorite juice or water every day until you are having soft formed stools. Then start taking once daily if you didn't have a stool the day before. Patient not taking: Reported on 08/08/2018 09/23/17   Merlyn Lot, MD    Allergies Ace inhibitors; Alendronate; Coreg [carvedilol]; Duloxetine; Morphine; Pregabalin; Statins; and Verapamil    Social History Social History   Tobacco Use  . Smoking status: Current Some Day Smoker    Packs/day: 0.25    Types: Cigarettes  . Smokeless tobacco: Never Used  Substance  Use Topics  . Alcohol use: No  . Drug use: No    Review of Systems Patient denies headaches, rhinorrhea, blurry vision, numbness, shortness of breath, chest pain, edema, cough, abdominal pain, nausea, vomiting, diarrhea, dysuria, fevers, rashes or hallucinations unless otherwise stated above in HPI. ____________________________________________   PHYSICAL EXAM:  VITAL SIGNS: Vitals:   08/08/18 1441 08/08/18 1500  BP: (!) 169/88 (!) 165/83  Pulse: 76 68  Resp: (!) 24 20  Temp:    SpO2: 99% 100%    Constitutional: Alert and oriented.  Frail-appearing Eyes: Conjunctivae are normal.  Head: Atraumatic. Nose: No congestion/rhinnorhea. Mouth/Throat: Mucous membranes are moist.   Neck: No stridor. Painless ROM.  Cardiovascular: Normal rate, regular rhythm. Grossly normal heart sounds.  Good peripheral circulation. Respiratory: Mild tachypnea with prolonged expiratory phase, diminished breath sounds throughout with faint expiratory wheeze. Gastrointestinal: Soft and nontender. No distention. No abdominal bruits. No CVA tenderness.  Genitourinary:  Musculoskeletal: No lower extremity tenderness nor edema.  No joint effusions. Neurologic:  Normal speech and language. No gross focal neurologic deficits are appreciated. No facial droop Skin:  Skin is warm, dry and intact. No rash noted. Psychiatric: Mood and affect are normal. Speech and behavior are normal.  ____________________________________________   LABS (all labs ordered are listed, but only abnormal results are displayed)  Results for orders placed or performed during the hospital encounter of 08/08/18 (from the past 24 hour(s))  Basic metabolic panel     Status: Abnormal   Collection Time: 08/08/18 11:57 AM  Result Value Ref Range   Sodium 137 135 - 145 mmol/L   Potassium 3.5 3.5 - 5.1 mmol/L   Chloride 96 (L) 98 - 111 mmol/L   CO2 31 22 - 32 mmol/L   Glucose, Bld 115 (H) 70 - 99 mg/dL   BUN 14 8 - 23 mg/dL   Creatinine,  Ser 0.72 0.44 - 1.00 mg/dL   Calcium 9.2 8.9 - 10.3 mg/dL   GFR calc non Af Amer >60 >60 mL/min   GFR calc Af Amer >60 >60 mL/min   Anion gap 10 5 - 15  CBC     Status: None   Collection Time: 08/08/18 11:57 AM  Result Value Ref Range   WBC 7.6 3.6 - 11.0 K/uL   RBC 4.51 3.80 - 5.20 MIL/uL   Hemoglobin 13.4 12.0 - 16.0 g/dL   HCT 39.2 35.0 - 47.0 %   MCV 87.0 80.0 - 100.0 fL   MCH 29.8 26.0 - 34.0 pg   MCHC 34.2 32.0 - 36.0 g/dL   RDW 14.3 11.5 - 14.5 %   Platelets 283 150 - 440 K/uL  Troponin I     Status: None   Collection Time: 08/08/18 11:57 AM  Result Value Ref Range   Troponin I <0.03 <0.03 ng/mL  Urinalysis, Complete w Microscopic     Status: Abnormal   Collection Time: 08/08/18  2:41 PM  Result Value Ref Range   Color, Urine STRAW (A) YELLOW   APPearance CLEAR (A) CLEAR   Specific Gravity, Urine 1.016 1.005 - 1.030   pH 7.0 5.0 - 8.0   Glucose, UA NEGATIVE NEGATIVE mg/dL   Hgb urine dipstick NEGATIVE NEGATIVE   Bilirubin Urine NEGATIVE NEGATIVE   Ketones, ur NEGATIVE NEGATIVE mg/dL   Protein, ur NEGATIVE NEGATIVE mg/dL   Nitrite NEGATIVE NEGATIVE   Leukocytes, UA NEGATIVE NEGATIVE   RBC / HPF 0-5 0 - 5 RBC/hpf   WBC, UA 0-5 0 - 5 WBC/hpf   Bacteria, UA RARE (A) NONE SEEN   Squamous Epithelial / LPF 0-5 0 - 5   Mucus PRESENT    ____________________________________________  EKG My review and personal interpretation at Time: 11:51   Indication: sob  Rate: 65  Rhythm: sinus Axis: normal Other: nonspeicifc st abnormality ____________________________________________  RADIOLOGY  I personally reviewed all radiographic images ordered to evaluate for the above acute complaints and reviewed radiology reports and findings.  These findings were personally discussed with the patient.  Please see medical record for radiology report.  ____________________________________________   PROCEDURES  Procedure(s) performed:  Procedures    Critical Care performed:  no ____________________________________________   INITIAL IMPRESSION / ASSESSMENT AND PLAN / ED COURSE  Pertinent labs & imaging results that were available during my care of the patient were reviewed by me and considered in my medical decision making (see chart for details).   DDX: Asthma, copd, CHF, pna,  ptx, malignancy, Pe, anemia   YASMEN CORTNER is a 77 y.o. who presents to the ED with generalized weakness and shortness of breath that described above.  She is afebrile with mild increased work of breathing and respiratory distress.  Requiring increased O2 at home.  Blood work sent for the above differential.  Patient is high risk by Wells criteria given her stable appearing chest x-ray will order CT angiogram to evaluate for PE.  Doubt heart failure or ACS.  He realizes as well as prednisone.  Clinical Course as of Aug 09 1519  Sat Aug 08, 2018  1500 Patient did not have hypoxic episode with ambulated with did have worsening dyspnea significant tachypnea use of accessory muscles speaking short sentences with only short ambulation.  Did improve with increasing O2 supplementation.  Now better at rest.  Gust results of CT imaging with patient and family.   [PR]    Clinical Course User Index [PR] Merlyn Lot, MD     As part of my medical decision making, I reviewed the following data within the [place] notes reviewed and incorporated, Labs reviewed, notes from prior ED visits .   ____________________________________________   FINAL CLINICAL IMPRESSION(S) / ED DIAGNOSES  Final diagnoses:  COPD exacerbation ([place])  Weakness      NEW MEDICATIONS STARTED DURING THIS VISIT:  New Prescriptions   No medications on file     Note:  This document was prepared using Dragon voice recognition software and may include unintentional dictation errors.    Merlyn Lot, MD 08/08/18 1520

## 2018-08-08 NOTE — Progress Notes (Signed)
Family Meeting Note  Advance Directive:yes  Today a meeting took place with the Patient, husband at bedside     The following clinical team members were present during this meeting:MD  The following were discussed:Patient's diagnosis: Acute respiratory failure, acute COPD exacerbation, malignant lung nodule, possible urinary tract infection, bronchitis, other comorbidities as documented below and plan of care discussed in detail with the patient and husband at bedside.  They both verbalized understanding of the plan.   Allergic rhinitis    . Anxiety   . Asthma   . B12 deficiency   . Breast cancer ([place]) 1999   RT LUMPECTOMY  . Carcinoma of right breast treated with adjuvant chemotherapy ([place]) 1999   RT LUMPECTOMY  . CHF (congestive heart failure) ([place])   . Colon cancer ([place]) 2010  . Colon cancer ([place])   . COPD (chronic obstructive pulmonary disease) ([place])   . Hyperlipemia   . Hypertension   . MRSA pneumonia ([place])   . Osteoporosis   . Radiation 1999   BREAST CA  . Right adrenal mass ([place]       Patient's progosis: Unable to determine and Goals for treatment: Full Code, husband and son are healthcare power of attorney  Additional follow-up to be provided: Hospitalist  Time spent during discussion:69min  Nicholes Mango, MD

## 2018-08-08 NOTE — ED Notes (Signed)
Walked the patient to the restroom and around the room. SPO2 98% 2 lpm, decreased to 94 while walking and she became very short of breath. Increased back to 100% after placed back into the bed.

## 2018-08-08 NOTE — ED Notes (Signed)
Patient transported to CT 

## 2018-08-08 NOTE — H&P (Signed)
[facility] [place] NAME: Robin Hudson    MR#:  [id number]  DATE OF BIRTH:  1941/09/29  DATE OF ADMISSION:  08/08/2018  PRIMARY CARE PHYSICIAN: Adin Hector, MD   REQUESTING/REFERRING PHYSICIAN: Quentin Cornwall, MD  CHIEF COMPLAINT:   Shortness of breath HISTORY OF PRESENT ILLNESS:  Robin Hudson  is a 77 y.o. female with a known history of COPD, malignant pulmonary nodule getting radiation therapy by radiation oncologist, continues to smoke, hypertension hyperlipidemia, history of breast and colon cancer currently reporting cancer free is presenting to the ED with a chief complaint of worsening of shortness of breath.  Patient was seen by primary care physician recently and was started on doxycycline for possible pneumonia.  Patient is not feeling better and came into the hospital.  Patient was diffusely wheezing.  CT chest with no pulmonary embolism.  Hospitalist team is called to admit the patient for COPD exacerbation and possible UTI.  Husband at bedside  PAST MEDICAL HISTORY:   Past Medical History:  Diagnosis Date  . Allergic rhinitis   . Anxiety   . Asthma   . B12 deficiency   . Breast cancer ([place]) 1999   RT LUMPECTOMY  . Carcinoma of right breast treated with adjuvant chemotherapy ([place]) 1999   RT LUMPECTOMY  . CHF (congestive heart failure) ([place])   . Colon cancer ([place]) 2010  . Colon cancer ([place])   . COPD (chronic obstructive pulmonary disease) ([place])   . Hyperlipemia   . Hypertension   . MRSA pneumonia ([place])   . Osteoporosis   . Radiation 1999   BREAST CA  . Right adrenal mass ([place])     PAST SURGICAL HISTOIRY:   Past Surgical History:  Procedure Laterality Date  . ABDOMINAL HYSTERECTOMY    . APPENDECTOMY    . BREAST SURGERY Right    lumpectomy x2 with lymph nodes  . CARPAL TUNNEL RELEASE Left 06/04/2016   Procedure: CARPAL TUNNEL RELEASE;  Surgeon: Hessie Knows, MD;  Location: [facility] ORS;  Service: Orthopedics;   Laterality: Left;  . colectomy     partial  . IMAGE GUIDED SINUS SURGERY    . OPEN REDUCTION INTERNAL FIXATION (ORIF) DISTAL RADIAL FRACTURE Left 06/04/2016   Procedure: OPEN REDUCTION INTERNAL FIXATION (ORIF) DISTAL RADIAL FRACTURE;  Surgeon: Hessie Knows, MD;  Location: [facility] ORS;  Service: Orthopedics;  Laterality: Left;    SOCIAL HISTORY:   Social History   Tobacco Use  . Smoking status: Current Some Day Smoker    Packs/day: 0.25    Types: Cigarettes  . Smokeless tobacco: Never Used  Substance Use Topics  . Alcohol use: No    FAMILY HISTORY:   Family History  Problem Relation Age of Onset  . Heart attack Mother   . Breast cancer Other   . Breast cancer Sister 80  . Breast cancer Sister 64    DRUG ALLERGIES:   Allergies  Allergen Reactions  . Ace Inhibitors     Other reaction(s): Cough  . Alendronate     Other reaction(s): Other (See Comments) GI UPSET  . Coreg [Carvedilol] Other (See Comments)    syncope  . Duloxetine Other (See Comments)  . Morphine Other (See Comments)    confusion  . Pregabalin     Other reaction(s): Other (See Comments) intolerant  . Statins Other (See Comments)  . Verapamil     Other reaction(s): Other (See Comments) CONSTIPATION    REVIEW OF SYSTEMS:  CONSTITUTIONAL: No fever, fatigue or weakness.  EYES: No blurred or double vision.  EARS, NOSE, AND THROAT: No tinnitus or ear pain.  RESPIRATORY: Reporting cough, shortness of breath, wheezing, denies hemoptysis.  CARDIOVASCULAR: No chest pain, orthopnea, edema.  GASTROINTESTINAL: No nausea, vomiting, diarrhea or abdominal pain.  GENITOURINARY: No dysuria, hematuria.  ENDOCRINE: No polyuria, nocturia,  HEMATOLOGY: No anemia, easy bruising or bleeding SKIN: No rash or lesion. MUSCULOSKELETAL: No joint pain or arthritis.   NEUROLOGIC: No tingling, numbness, weakness.  PSYCHIATRY: No anxiety or depression.   MEDICATIONS AT HOME:   Prior to Admission medications   Medication  Sig Start Date End Date Taking? Authorizing Provider  albuterol (PROAIR HFA) 108 (90 Base) MCG/ACT inhaler INHALE 2 PUFFS BY MOUTH INTO THE LUNGS EVERY 6 HOURS AS NEEDED FOR WHEEZING 10/31/17  Yes [provider]  albuterol (PROVENTIL) (2.5 MG/3ML) 0.083% nebulizer solution Take 2.5 mg by nebulization every 6 (six) hours as needed for wheezing or shortness of breath.   Yes [provider]  ALPRAZolam Duanne Moron) 0.5 MG tablet Take 1 tablet by mouth 2 (two) times daily as needed for anxiety.  04/19/15  Yes [provider]  aspirin EC 81 MG tablet Take 1 tablet (81 mg total) by mouth daily. 06/28/16  Yes Fritzi Mandes, MD  atenolol (TENORMIN) 50 MG tablet Take 1 tablet (50 mg total) by mouth daily. Patient taking differently: Take 25 mg by mouth daily.  05/18/15  Yes Tama High III, MD  azelastine (ASTELIN) 0.1 % nasal spray Place 1 spray into both nostrils 2 (two) times daily. Use in each nostril as directed   Yes [provider]  cholecalciferol (VITAMIN D) 1000 units tablet Take 1,000 Units by mouth daily.   Yes [provider]  cyanocobalamin (,VITAMIN B-12,) 1000 MCG/ML injection Inject 1,000 mcg into the muscle every 30 (thirty) days.  03/26/16  Yes [provider]  Fluticasone-Salmeterol (ADVAIR DISKUS) 250-50 MCG/DOSE AEPB INHALE 1 PUFF INTO THE LUNGS EVERY 12 HOURS 12/22/17  Yes [provider]  HYDROcodone-acetaminophen (NORCO) 7.5-325 MG tablet Take 1 tablet by mouth 3 (three) times daily as needed for moderate pain.   Yes [provider]  hydroxychloroquine (PLAQUENIL) 200 MG tablet Take 400 mg by mouth daily.   Yes [provider]  losartan (COZAAR) 50 MG tablet Take 1 tablet (50 mg total) by mouth daily. 05/18/15  Yes Tama High III, MD  predniSONE (DELTASONE) 10 MG tablet Take 10 mg by mouth daily with breakfast.   Yes [provider]  butalbital-acetaminophen-caffeine (FIORICET, ESGIC) 50-325-40 MG tablet  Take 1 tablet by mouth every 6 (six) hours as needed.  12/19/17   [provider]  meclizine (ANTIVERT) 12.5 MG tablet Take 1 tablet (12.5 mg total) by mouth 3 (three) times daily as needed for dizziness. Patient not taking: Reported on 08/08/2018 09/08/16   Merlyn Lot, MD  pantoprazole (PROTONIX) 20 MG tablet Take 1 tablet (20 mg total) 2 (two) times daily by mouth. 09/23/17 03/04/18  Merlyn Lot, MD  polyethylene glycol [facility] / Floria Raveling) packet Take 17 g daily by mouth. Mix one tablespoon with 8oz of your favorite juice or water every day until you are having soft formed stools. Then start taking once daily if you didn't have a stool the day before. Patient not taking: Reported on 08/08/2018 09/23/17   Merlyn Lot, MD      VITAL SIGNS:  Blood pressure (!) 164/79, pulse (!) 58, temperature (!) 97.4 F (36.3 C),  temperature source Oral, resp. rate 20, height 5\' 4"  (1.626 m), weight 44.9 kg, SpO2 99 %.  PHYSICAL EXAMINATION:  GENERAL:  77 y.o.-year-old patient lying in the bed with no acute distress.  EYES: Pupils equal, round, reactive to light and accommodation. No scleral icterus. Extraocular muscles intact.  HEENT: Head atraumatic, normocephalic. Oropharynx and nasopharynx clear.  NECK:  Supple, no jugular venous distention. No thyroid enlargement, no tenderness.  LUNGS: Moderately diminished breath sounds with diffuse wheezing, no rales no rhonchi or crepitations.  No use of accessory muscles of respiration.  CARDIOVASCULAR: S1, S2 normal. No murmurs, rubs, or gallops.  ABDOMEN: Soft, nontender, nondistended. Bowel sounds present. No organomegaly or mass.  EXTREMITIES: No pedal edema, cyanosis, or clubbing.  NEUROLOGIC: Cranial nerves II through XII are intact. Muscle strength globally weak in all extremities. Sensation intact. Gait not checked.  PSYCHIATRIC: The patient is alert and oriented x 3.  SKIN: No obvious rash, lesion, or ulcer.   LABORATORY PANEL:    CBC Recent Labs  Lab 08/08/18 1157  WBC 7.6  HGB 13.4  HCT 39.2  PLT 283   ------------------------------------------------------------------------------------------------------------------  Chemistries  Recent Labs  Lab 08/08/18 1157  NA 137  K 3.5  CL 96*  CO2 31  GLUCOSE 115*  BUN 14  CREATININE 0.72  CALCIUM 9.2   ------------------------------------------------------------------------------------------------------------------  Cardiac Enzymes Recent Labs  Lab 08/08/18 1157  TROPONINI <0.03   ------------------------------------------------------------------------------------------------------------------  RADIOLOGY:  Dg Chest 2 View  Result Date: 08/08/2018 CLINICAL DATA:  Acute shortness of breath for 3 days. EXAM: CHEST - 2 VIEW COMPARISON:  07/12/2018 and prior radiographs. 10/30/2017 chest CT and prior studies FINDINGS: Cardiomediastinal silhouette is unchanged. Biapical pleuroparenchymal opacities/scarring again noted. Hyperinflation again noted. No new mass, pneumothorax or pleural effusion noted. IMPRESSION: 1. No evidence of acute abnormality 2. Hyperinflation and bi apical pleuroparenchymal opacities/scarring again noted. Electronically Signed   By: Margarette Canada M.D.   On: 08/08/2018 12:33   Ct Angio Chest Pe W And/or Wo Contrast  Result Date: 08/08/2018 CLINICAL DATA:  77 year old female with acute shortness of breath and weakness for 2 days. EXAM: CT ANGIOGRAPHY CHEST WITH CONTRAST TECHNIQUE: Multidetector CT imaging of the chest was performed using the standard protocol during bolus administration of intravenous contrast. Multiplanar CT image reconstructions and MIPs were obtained to evaluate the vascular anatomy. CONTRAST:  49mL OMNIPAQUE IOHEXOL 350 MG/ML SOLN COMPARISON:  None. FINDINGS: Cardiovascular: This is a technically satisfactory study. No pulmonary emboli are identified. Mild cardiomegaly noted. Coronary artery and aortic atherosclerotic  calcifications noted without thoracic aortic aneurysm. A small pericardial effusion is unchanged. Mediastinum/Nodes: No enlarged mediastinal, hilar, or axillary lymph nodes. Thyroid gland, trachea, and esophagus demonstrate no significant findings. Lungs/Pleura: A 1.2 x 1.6 cm SUPERIOR segment LEFT LOWER lobe nodule (series 6: Image 23) has a more solid appearance when compared to 10/30/2017 but is relatively unchanged from 07/17/2017 PET CT. A 1.3 x 1.5 cm peripheral nodular opacity within the SUPERIOR segment of the RIGHT LOWER lobe (6:35) is also more confluent/solid-appearing. Bilateral UPPER lung confluence opacities/scarring are unchanged. No new pulmonary opacities are identified. No pleural effusion or pneumothorax. Upper Abdomen: No acute abnormality. Musculoskeletal: No acute abnormalities. No suspicious focal bony lesions. Review of the MIP images confirms the above findings. IMPRESSION: 1. No evidence of acute abnormality. No evidence of pulmonary emboli or thoracic aortic aneurysm. 2. 1.2 x 1.6 cm SUPERIOR segment LEFT LOWER lobe nodule and 1.3 x 1.5 cm SUPERIOR segment RIGHT LOWER lobe nodule, both which appear  more confluence/solid. Consider close CT follow-up or PET-CT. 3. Emphysema/COPD changes with unchanged bilateral UPPER lung opacity/scarring. 4. Cardiomegaly, coronary artery and Aortic Atherosclerosis (ICD10-I70.0). Electronically Signed   By: Margarette Canada M.D.   On: 08/08/2018 13:53    EKG:   Orders placed or performed during the hospital encounter of 08/08/18  . ED EKG  . ED EKG  . EKG 12-Lead  . EKG 12-Lead    IMPRESSION AND PLAN:     Acute respiratory distress secondary to COPD exacerbation with continued smoking history Admit to MedSurg unit Failed outpatient antibiotics doxycycline for possible pneumonia  Solu-Medrol, bronchodilator treatment IV Rocephin   Acute bronchitis IV Rocephin and bronchodilator treatments   Hypertension Continue home medication  atenolol Hyper lipidemia continue statin  Malignant pulmonary nodule Seeing radiation oncology Dr. Donella Stade continue follow-up  History of breast cancer and colon cancer status post chemotherapy Follow-up with oncology for surveillance as recommended   Tobacco abuse disorder counseled patient to quit smoking for 5 minutes.  Patient continues to smoke 1 pack a day.  Agreeable to nicotine patch      All the records are reviewed and case discussed with ED provider. Management plans discussed with the patient, family and they are in agreement.  CODE STATUS: fc   TOTAL TIME TAKING CARE OF THIS PATIENT: 43 minutes.   Note: This dictation was prepared with Dragon dictation along with smaller phrase technology. Any transcriptional errors that result from this process are unintentional.  Nicholes Mango M.D on 08/08/2018 at 3:58 PM  Between 7am to 6pm - Pager - [phone]  After 6pm go to [email or web address] - password [email or web address] [facility]  [facility]  Office  [phone]  CC: Primary care physician; Adin Hector, MD

## 2018-08-08 NOTE — ED Notes (Signed)
Pt reports taking hydrocodone 7.5 at 530 this am.

## 2018-08-09 LAB — CBC
HCT: 35.2 % (ref 35.0–47.0)
HEMOGLOBIN: 11.9 g/dL — AB (ref 12.0–16.0)
MCH: 29.3 pg (ref 26.0–34.0)
MCHC: 33.8 g/dL (ref 32.0–36.0)
MCV: 86.7 fL (ref 80.0–100.0)
PLATELETS: 261 10*3/uL (ref 150–440)
RBC: 4.06 MIL/uL (ref 3.80–5.20)
RDW: 13.9 % (ref 11.5–14.5)
WBC: 10.3 10*3/uL (ref 3.6–11.0)

## 2018-08-09 LAB — CREATININE, SERUM: CREATININE: 0.69 mg/dL (ref 0.44–1.00)

## 2018-08-09 MED ORDER — ENOXAPARIN SODIUM 30 MG/0.3ML ~~LOC~~ SOLN
30.0000 mg | SUBCUTANEOUS | Status: DC
  Administered 2018-08-09: 30 mg via SUBCUTANEOUS
  Filled 2018-08-09: qty 0.3

## 2018-08-09 MED ORDER — SODIUM CHLORIDE 0.9 % IV SOLN
INTRAVENOUS | Status: DC | PRN
  Administered 2018-08-09: 250 mL via INTRAVENOUS

## 2018-08-09 NOTE — Progress Notes (Addendum)
[facility] [place] NAME: Kezia Benevides    MR#:  [id number]  DATE OF BIRTH:  10-16-1941  SUBJECTIVE:  CHIEF COMPLAINT:   Chief Complaint  Patient presents with  . Shortness of Breath  . Weakness  Patient seen and evaluated today Has wheezing Occasional cough No fever and chills  REVIEW OF SYSTEMS:    ROS  CONSTITUTIONAL: No documented fever. No fatigue, weakness. No weight gain, no weight loss.  EYES: No blurry or double vision.  ENT: No tinnitus. No postnasal drip. No redness of the oropharynx.  RESPIRATORY: Has occasional cough, has wheeze, no hemoptysis.  Has dyspnea.  CARDIOVASCULAR: No chest pain. No orthopnea. No palpitations. No syncope.  GASTROINTESTINAL: No nausea, no vomiting or diarrhea. No abdominal pain. No melena or hematochezia.  GENITOURINARY: No dysuria or hematuria.  ENDOCRINE: No polyuria or nocturia. No heat or cold intolerance.  HEMATOLOGY: No anemia. No bruising. No bleeding.  INTEGUMENTARY: No rashes. No lesions.  MUSCULOSKELETAL: No arthritis. No swelling. No gout.  NEUROLOGIC: No numbness, tingling, or ataxia. No seizure-type activity.  PSYCHIATRIC: No anxiety. No insomnia. No ADD.   DRUG ALLERGIES:   Allergies  Allergen Reactions  . Ace Inhibitors     Other reaction(s): Cough  . Alendronate     Other reaction(s): Other (See Comments) GI UPSET  . Coreg [Carvedilol] Other (See Comments)    syncope  . Duloxetine Other (See Comments)  . Morphine Other (See Comments)    confusion  . Pregabalin     Other reaction(s): Other (See Comments) intolerant  . Statins Other (See Comments)  . Verapamil     Other reaction(s): Other (See Comments) CONSTIPATION    VITALS:  Blood pressure 113/61, pulse 82, temperature 98.1 F (36.7 C), temperature source Oral, resp. rate 18, height 5\' 4"  (1.626 m), weight 44.9 kg, SpO2 95 %.  PHYSICAL EXAMINATION:   Physical Exam  GENERAL:  77 y.o.-year-old patient lying  in the bed with no acute distress.  EYES: Pupils equal, round, reactive to light and accommodation. No scleral icterus. Extraocular muscles intact.  HEENT: Head atraumatic, normocephalic. Oropharynx and nasopharynx clear.  NECK:  Supple, no jugular venous distention. No thyroid enlargement, no tenderness.  LUNGS: Decreased breath sounds bilaterally, lateral wheezing, no rales, rhonchi. No use of accessory muscles of respiration.  CARDIOVASCULAR: S1, S2 normal. No murmurs, rubs, or gallops.  ABDOMEN: Soft, nontender, nondistended. Bowel sounds present. No organomegaly or mass.  EXTREMITIES: No cyanosis, clubbing or edema b/l.    NEUROLOGIC: Cranial nerves II through XII are intact. No focal Motor or sensory deficits b/l.   PSYCHIATRIC: The patient is alert and oriented x 3.  SKIN: No obvious rash, lesion, or ulcer.   LABORATORY PANEL:   CBC Recent Labs  Lab 08/08/18 1157  WBC 7.6  HGB 13.4  HCT 39.2  PLT 283   ------------------------------------------------------------------------------------------------------------------ Chemistries  Recent Labs  Lab 08/08/18 1157  NA 137  K 3.5  CL 96*  CO2 31  GLUCOSE 115*  BUN 14  CREATININE 0.72  CALCIUM 9.2   ------------------------------------------------------------------------------------------------------------------  Cardiac Enzymes Recent Labs  Lab 08/08/18 1157  TROPONINI <0.03   ------------------------------------------------------------------------------------------------------------------  RADIOLOGY:  Dg Chest 2 View  Result Date: 08/08/2018 CLINICAL DATA:  Acute shortness of breath for 3 days. EXAM: CHEST - 2 VIEW COMPARISON:  07/12/2018 and prior radiographs. 10/30/2017 chest CT and prior studies FINDINGS: Cardiomediastinal silhouette is unchanged. Biapical pleuroparenchymal opacities/scarring again noted. Hyperinflation again noted. No new mass,  pneumothorax or pleural effusion noted. IMPRESSION: 1. No evidence  of acute abnormality 2. Hyperinflation and bi apical pleuroparenchymal opacities/scarring again noted. Electronically Signed   By: Margarette Canada M.D.   On: 08/08/2018 12:33   Ct Angio Chest Pe W And/or Wo Contrast  Result Date: 08/08/2018 CLINICAL DATA:  77 year old female with acute shortness of breath and weakness for 2 days. EXAM: CT ANGIOGRAPHY CHEST WITH CONTRAST TECHNIQUE: Multidetector CT imaging of the chest was performed using the standard protocol during bolus administration of intravenous contrast. Multiplanar CT image reconstructions and MIPs were obtained to evaluate the vascular anatomy. CONTRAST:  49mL OMNIPAQUE IOHEXOL 350 MG/ML SOLN COMPARISON:  None. FINDINGS: Cardiovascular: This is a technically satisfactory study. No pulmonary emboli are identified. Mild cardiomegaly noted. Coronary artery and aortic atherosclerotic calcifications noted without thoracic aortic aneurysm. A small pericardial effusion is unchanged. Mediastinum/Nodes: No enlarged mediastinal, hilar, or axillary lymph nodes. Thyroid gland, trachea, and esophagus demonstrate no significant findings. Lungs/Pleura: A 1.2 x 1.6 cm SUPERIOR segment LEFT LOWER lobe nodule (series 6: Image 23) has a more solid appearance when compared to 10/30/2017 but is relatively unchanged from 07/17/2017 PET CT. A 1.3 x 1.5 cm peripheral nodular opacity within the SUPERIOR segment of the RIGHT LOWER lobe (6:35) is also more confluent/solid-appearing. Bilateral UPPER lung confluence opacities/scarring are unchanged. No new pulmonary opacities are identified. No pleural effusion or pneumothorax. Upper Abdomen: No acute abnormality. Musculoskeletal: No acute abnormalities. No suspicious focal bony lesions. Review of the MIP images confirms the above findings. IMPRESSION: 1. No evidence of acute abnormality. No evidence of pulmonary emboli or thoracic aortic aneurysm. 2. 1.2 x 1.6 cm SUPERIOR segment LEFT LOWER lobe nodule and 1.3 x 1.5 cm SUPERIOR  segment RIGHT LOWER lobe nodule, both which appear more confluence/solid. Consider close CT follow-up or PET-CT. 3. Emphysema/COPD changes with unchanged bilateral UPPER lung opacity/scarring. 4. Cardiomegaly, coronary artery and Aortic Atherosclerosis (ICD10-I70.0). Electronically Signed   By: Margarette Canada M.D.   On: 08/08/2018 13:53     ASSESSMENT AND PLAN:   8-year-old female patient with history of pulmonary nodule, COPD, breast cancer, colon cancer, hypertension currently under hospitalist service for shortness of breath and wheezing  -Acute on chronic respiratory failure Continue oxygen via nasal cannula Nebulization treatments  -Acute COPD exacerbation Continue IV Solu-Medrol and bronchodilators IV Rocephin antibiotic  -Acute on chronic bronchitis Continue antibiotics MRSA PCR negative  -Malignant pulmonary nodule Tobacco cessation counseled to the patient for 6 minutes Nicotine patch offered Follows up with radiation oncology Dr. Donella Stade as outpatient Periodic imaging studies  -DVT prophylaxis subcu Lovenox daily  All the records are reviewed and case discussed with Care Management/Social Worker. Management plans discussed with the patient, family and they are in agreement.  CODE STATUS: Full code  DVT Prophylaxis: SCDs  TOTAL TIME TAKING CARE OF THIS PATIENT: 34 minutes.   POSSIBLE D/C IN 1 to 2 DAYS, DEPENDING ON CLINICAL CONDITION.  Saundra Shelling M.D on 08/09/2018 at 11:42 AM  Between 7am to 6pm - Pager - [phone]  After 6pm go to [email or web address] - password [email or web address] [facility]  [facility] [place]  Office  [phone]  CC: Primary care physician; Adin Hector, MD  Note: This dictation was prepared with Dragon dictation along with smaller phrase technology. Any transcriptional errors that result from this process are unintentional.

## 2018-08-09 NOTE — Evaluation (Signed)
Physical Therapy Evaluation Patient Details Name: Robin Hudson MRN: [id number] DOB: January 27, 1941 Today's Date: 08/09/2018   History of Present Illness  Patient was seen by primary care physician recently and was started on doxycycline for possible pneumonia.  Patient is not feeling better and came into the hospital.  Patient was diffusely wheezing.  CT chest with no pulmonary embolism.  Hospitalist team is called to admit the patient for COPD exacerbation and possible UTI.    Clinical Impression  Patient is 77 yr old female needing modified independent assist for bed mobility, transfers and ambulation with RW and 3 l O2. Marland Kitchen Her strength is WFL UE and LE and she ambulates with a slow gait speed with O2 saturation rate above 90%. She will benefit from skilled PT to improve mobility and gait.     Follow Up Recommendations No PT follow up    Equipment Recommendations  Rolling walker with 5" wheels    Recommendations for Other Services       Precautions / Restrictions Precautions Required Braces or Orthoses: (O2 3 L) Restrictions Weight Bearing Restrictions: No      Mobility  Bed Mobility Overal bed mobility: Modified Independent                Transfers Overall transfer level: Modified independent Equipment used: Rolling walker (2 wheeled)                Ambulation/Gait Ambulation/Gait assistance: Modified independent (Device/Increase time) Gait Distance (Feet): (300) Assistive device: Rolling walker (2 wheeled) Gait Pattern/deviations: Decreased step length - right;Decreased step length - left Gait velocity: (. 4 m/sec) Gait velocity interpretation: <1.31 ft/sec, indicative of household Conservation officer, historic buildings [place] (Stroke Patients Only)       Balance Overall balance assessment: Modified Independent                                           Pertinent Vitals/Pain Pain Assessment:  Faces Pain Score: 5  Pain Location: (back) Pain Descriptors / Indicators: Aching Pain Intervention(s): Limited activity within patient's tolerance    Home Living Family/patient expects to be discharged to:: Private residence Living Arrangements: Spouse/significant other   Type of Home: House Home Access: Stairs to enter   Technical brewer of Steps: 1 Home Layout: One level        Prior Function Level of Independence: Independent               Hand Dominance        Extremity/Trunk Assessment   Upper Extremity Assessment Upper Extremity Assessment: Overall WFL for tasks assessed    Lower Extremity Assessment Lower Extremity Assessment: Overall WFL for tasks assessed       Communication   Communication: No difficulties  Cognition Arousal/Alertness: Awake/alert Behavior During Therapy: WFL for tasks assessed/performed Overall Cognitive Status: Within Functional Limits for tasks assessed                                        General Comments      Exercises     Assessment/Plan    PT Assessment    PT Problem List         PT Treatment  Interventions      PT Goals (Current goals can be found in the Care Plan section)  Acute Rehab PT Goals Patient Stated Goal: (to go home) PT Goal Formulation: With patient Potential to Achieve Goals: Good    Frequency     Barriers to discharge        Co-evaluation               AM-PAC PT "6 Clicks" Daily Activity  Outcome Measure Difficulty turning over in bed (including adjusting bedclothes, sheets and blankets)?: A Little Difficulty moving from lying on back to sitting on the side of the bed? : A Little Difficulty sitting down on and standing up from a chair with arms (e.g., wheelchair, bedside commode, etc,.)?: A Little Help needed moving to and from a bed to chair (including a wheelchair)?: A Little Help needed walking in hospital room?: A Little Help needed climbing 3-5 steps  with a railing? : A Little 6 Click Score: 18    End of Session Equipment Utilized During Treatment: Gait belt;Oxygen Activity Tolerance: Patient tolerated treatment well;Patient limited by pain     PT Visit Diagnosis: Difficulty in walking, not elsewhere classified (R26.2);Pain Pain - part of body: (back)    Time: 1030-1108 PT Time Calculation (min) (ACUTE ONLY): 38 min   Charges:   PT Evaluation $PT Eval Low Complexity: 1 Low PT Treatments $Gait Training: 8-22 mins $Therapeutic Activity: 8-22 mins          Arelia Sneddon S, PT DPT 08/09/2018, 12:12 PM

## 2018-08-09 NOTE — Progress Notes (Signed)
PHARMACIST - PHYSICIAN COMMUNICATION  CONCERNING:  Enoxaparin (Lovenox) for DVT Prophylaxis    RECOMMENDATION: Patient was prescribed enoxaprin 40mg  q24 hours for VTE prophylaxis.   Filed Weights   08/08/18 1149  Weight: 99 lb (44.9 kg)    Body mass index is 16.99 kg/m.  Estimated Creatinine Clearance: 42.4 mL/min (by C-G formula based on SCr of 0.72 mg/dL).  Patient is candidate for enoxaparin 30mg  every 24 hours based on Weight less then 45kg for female   DESCRIPTION: Pharmacy has adjusted enoxaparin dose.  Patient is now receiving enoxaparin 30mg  every 24 hours.  Pernell Dupre, PharmD, BCPS Clinical Pharmacist 08/09/2018 11:43 AM

## 2018-08-09 NOTE — Evaluation (Signed)
Occupational Therapy Evaluation Patient Details Name: Robin Hudson MRN: [id number] DOB: [birth year] Today's Date: 08/09/2018    History of Present Illness Pt. is a 77 y.o. female who was admitted to [facility] with COPD exacerbation, and possible UTI.   Clinical Impression   Pt. Presents with limited activity tolerance which limits her ability to complete basic ADL and IADL functioning. Pt. Resides at home with her husband. Pt. Was independent with ADLs, and IADL functioning: including meal preparation, and medication management. Pt. Was able to drive. Pt. has home O2 that she uses as needed. Pt. education was provided about energy conservation, work simplification techniques during ADLs, and IADLs. Reviewed strategies as pt. Fatigues when cooking at home. Pt. Could benefit from OT services for ADL training, A/E training, and pt. education about Energy conservation, and work simplification techniques, home modification, and DME. Pt. plans to return home with her husband upon discharge.     Follow Up Recommendations  No OT follow up    Equipment Recommendations       Recommendations for Other Services       Precautions / Restrictions Precautions Required Braces or Orthoses: (O2 3 L) Restrictions Weight Bearing Restrictions: No      Mobility Bed Mobility Overal bed mobility: Modified Independent                Transfers Overall transfer level: Modified independent Equipment used: Rolling walker (2 wheeled)             General transfer comment: Mobility per PT    Balance Overall balance assessment: Modified Independent                                         ADL either performed or assessed with clinical judgement   ADL Overall ADL's : Needs assistance/impaired Eating/Feeding: Independent;Set up   Grooming: Set up;Independent   Upper Body Bathing: Set up;Independent   Lower Body Bathing: Set up;Min guard   Upper Body Dressing : Set  up;Independent   Lower Body Dressing: Set up;Min guard                 General ADL Comments: Pt. eductaion was provided about energay conservation, work simplification techniques.     Vision Baseline Vision/History: Wears glasses Wears Glasses: At all times Patient Visual Report: No change from baseline       Perception     Praxis      Pertinent Vitals/Pain Pain Assessment: No/denies pain Pain Score: 5  Pain Location: (back) Pain Descriptors / Indicators: Aching Pain Intervention(s): Limited activity within patient's tolerance     Hand Dominance Right   Extremity/Trunk Assessment Upper Extremity Assessment Upper Extremity Assessment: Overall WFL for tasks assessed     Communication Communication Communication: No difficulties   Cognition Arousal/Alertness: Awake/alert Behavior During Therapy: WFL for tasks assessed/performed Overall Cognitive Status: Within Functional Limits for tasks assessed                                     General Comments       Exercises     Shoulder Instructions      Home Living Family/patient expects to be discharged to:: Private residence Living Arrangements: Spouse/significant other Available Help at Discharge: Family Type of Home: House Home Access: Stairs to enter [employer] of Steps:  1   Home Layout: One level     Bathroom Shower/Tub: Walk-in shower         Home Equipment: (Home O2 (pt. reports as needed).)          Prior Functioning/Environment Level of Independence: Independent                 OT Problem List: Decreased activity tolerance;Pain;Decreased knowledge of use of DME or AE      OT Treatment/Interventions: Self-care/ADL training;Therapeutic activities;DME and/or AE instruction;Energy conservation;Patient/family education    OT Goals(Current goals can be found in the care plan section) Acute Rehab OT Goals Patient Stated Goal: To return home OT Goal  Formulation: With patient Potential to Achieve Goals: Good  OT Frequency: Min 1X/week   Barriers to D/C:            Co-evaluation              AM-PAC PT "6 Clicks" Daily Activity     Outcome Measure Help from another person eating meals?: None Help from another person taking care of personal grooming?: None Help from another person toileting, which includes using toliet, bedpan, or urinal?: A Little Help from another person bathing (including washing, rinsing, drying)?: A Little Help from another person to put on and taking off regular upper body clothing?: None Help from another person to put on and taking off regular lower body clothing?: A Little 6 Click Score: 21   End of Session Equipment Utilized During Treatment: Gait belt  Activity Tolerance: Patient tolerated treatment well Patient left: in bed;with call bell/phone within reach;with bed alarm set  OT Visit Diagnosis: Muscle weakness (generalized) (M62.81)                Time: 8006-3494 OT Time Calculation (min): 15 min Charges:  OT General Charges $OT Visit: 1 Visit OT Evaluation $OT Eval Low Complexity: 1 Low Harrel Carina, MS, OTR/L   Harrel Carina 08/09/2018, 12:44 PM

## 2018-08-10 LAB — URINE CULTURE: Culture: <10000 — AB

## 2018-08-10 MED ORDER — AMOXICILLIN-POT CLAVULANATE 875-125 MG PO TABS: 1.0000 | ORAL_TABLET | Freq: Two times a day (BID) | ORAL | 0 refills | Status: AC

## 2018-08-10 MED ORDER — PREDNISONE 10 MG PO TABS: 10.0000 mg | ORAL_TABLET | Freq: Every day | ORAL | 0 refills | Status: DC

## 2018-08-10 NOTE — Discharge Summary (Addendum)
[place] [facility] [place] NAME: Robin Hudson    MR#:  [id number]  DATE OF BIRTH:  01-02-1941  DATE OF ADMISSION:  08/08/2018 ADMITTING PHYSICIAN: Robin Mango, MD  DATE OF DISCHARGE: 08/10/2018  PRIMARY CARE PHYSICIAN: Robin Hector, MD   ADMISSION DIAGNOSIS:  Weakness [R53.1] COPD exacerbation (HCC) [J44.1] Acute bronchitis Hypertension Malignant pulmonary nodule History of breast and colon cancer DISCHARGE DIAGNOSIS:  Active Problems:   Acute exacerbation of chronic obstructive pulmonary disease (COPD) (HCC) Malignant pulmonary nodule Hypertension History of breast and colon cancer  SECONDARY DIAGNOSIS:   Past Medical History:  Diagnosis Date  . Allergic rhinitis   . Anxiety   . Asthma   . B12 deficiency   . Breast cancer ([place]) 1999   RT LUMPECTOMY  . Carcinoma of right breast treated with adjuvant chemotherapy ([place]) 1999   RT LUMPECTOMY  . CHF (congestive heart failure) ([place])   . Colon cancer ([place]) 2010  . Colon cancer ([place])   . COPD (chronic obstructive pulmonary disease) ([place])   . Hyperlipemia   . Hypertension   . MRSA pneumonia ([place])   . Osteoporosis   . Radiation 1999   BREAST CA  . Right adrenal mass [facility])      ADMITTING HISTORY Robin Hudson  is a 77 y.o. female with a known history of COPD, malignant pulmonary nodule getting radiation therapy by radiation oncologist, continues to smoke, hypertension hyperlipidemia, history of breast and colon cancer currently reporting cancer free is presenting to the ED with a chief complaint of worsening of shortness of breath.  Patient was seen by primary care physician recently and was started on doxycycline for possible pneumonia.  Patient is not feeling better and came into the hospital.  Patient was diffusely wheezing.  CT chest with no pulmonary embolism.  Hospitalist team is called to admit the patient for COPD exacerbation and possible UTI.  Husband at [place]:  Patient admitted to medical floor.  Patient received IV Solu-Medrol and nebulization treatments aggressively.  Patient was started on IV Rocephin antibiotic.  Smoking cessation counseled to the patient and nicotine patch offered.  Shortness of breath and wheezing improved with Solu-Medrol and ablation treatments.  Bronchitis improved with antibiotics.  Patient was back to baseline oxygen by nasal cannula at 2 L.  She will be discharged home with home health services.  CONSULTS OBTAINED:  None  DRUG ALLERGIES:   Allergies  Allergen Reactions  . Ace Inhibitors     Other reaction(s): Cough  . Alendronate     Other reaction(s): Other (See Comments) GI UPSET  . Coreg [Carvedilol] Other (See Comments)    syncope  . Duloxetine Other (See Comments)  . Morphine Other (See Comments)    confusion  . Pregabalin     Other reaction(s): Other (See Comments) intolerant  . Statins Other (See Comments)  . Verapamil     Other reaction(s): Other (See Comments) CONSTIPATION    DISCHARGE MEDICATIONS:   Allergies as of 08/10/2018      Reactions   Ace Inhibitors    Other reaction(s): Cough   Alendronate    Other reaction(s): Other (See Comments) GI UPSET   Coreg [carvedilol] Other (See Comments)   syncope   Duloxetine Other (See Comments)   Morphine Other (See Comments)   confusion   Pregabalin    Other reaction(s): Other (See Comments) intolerant   Statins Other (See Comments)   Verapamil  Other reaction(s): Other (See Comments) CONSTIPATION      Medication List    STOP taking these medications   meclizine 12.5 MG tablet Commonly known as:  ANTIVERT   polyethylene glycol packet Commonly known as:  MIRALAX / GLYCOLAX     TAKE these medications   ADVAIR DISKUS 250-50 MCG/DOSE Aepb Generic drug:  Fluticasone-Salmeterol INHALE 1 PUFF INTO THE LUNGS EVERY 12 HOURS   albuterol (2.5 MG/3ML) 0.083% nebulizer solution Commonly known as:  PROVENTIL Take 2.5 mg by  nebulization every 6 (six) hours as needed for wheezing or shortness of breath.   PROAIR HFA 108 (90 Base) MCG/ACT inhaler Generic drug:  albuterol INHALE 2 PUFFS BY MOUTH INTO THE LUNGS EVERY 6 HOURS AS NEEDED FOR WHEEZING   ALPRAZolam 0.5 MG tablet Commonly known as:  XANAX Take 1 tablet by mouth 2 (two) times daily as needed for anxiety.   amoxicillin-clavulanate 875-125 MG tablet Commonly known as:  AUGMENTIN Take 1 tablet by mouth 2 (two) times daily for 5 days.   aspirin EC 81 MG tablet Take 1 tablet (81 mg total) by mouth daily.   atenolol 50 MG tablet Commonly known as:  TENORMIN Take 1 tablet (50 mg total) by mouth daily. What changed:  how much to take   azelastine 0.1 % nasal spray Commonly known as:  ASTELIN Place 1 spray into both nostrils 2 (two) times daily. Use in each nostril as directed   butalbital-acetaminophen-caffeine 50-325-40 MG tablet Commonly known as:  FIORICET, ESGIC Take 1 tablet by mouth every 6 (six) hours as needed.   cholecalciferol 1000 units tablet Commonly known as:  VITAMIN D Take 1,000 Units by mouth daily.   cyanocobalamin 1000 MCG/ML injection Commonly known as:  (VITAMIN B-12) Inject 1,000 mcg into the muscle every 30 (thirty) days.   HYDROcodone-acetaminophen 7.5-325 MG tablet Commonly known as:  NORCO Take 1 tablet by mouth 3 (three) times daily as needed for moderate pain.   hydroxychloroquine 200 MG tablet Commonly known as:  PLAQUENIL Take 400 mg by mouth daily.   losartan 50 MG tablet Commonly known as:  COZAAR Take 1 tablet (50 mg total) by mouth daily.   pantoprazole 20 MG tablet Commonly known as:  PROTONIX Take 1 tablet (20 mg total) 2 (two) times daily by mouth.   predniSONE 10 MG tablet Commonly known as:  DELTASONE Take 1 tablet (10 mg total) by mouth daily. Label  & dispense according to the schedule below.  6 tablets day one, then 5 table day 2, then 4 tablets day 3, then 3 tablets day 4, 2 tablets day 5,  then 1 tablet day 6, then stop What changed:    when to take this  additional instructions       Today  Patient seen and evaluated today No shortness of breath No wheezing Tolerating diet well VITAL SIGNS:  Blood pressure 126/75, pulse 74, temperature 98.1 F (36.7 C), temperature source Oral, resp. rate 20, height 5' 4.02" (1.626 m), weight 44.9 kg, SpO2 100 %.  I/O:    Intake/Output Summary (Last 24 hours) at 08/10/2018 1307 Last data filed at 08/10/2018 0957 Gross per 24 hour  Intake 690.85 ml  Output 1150 ml  Net -459.15 ml    PHYSICAL EXAMINATION:  Physical Exam  GENERAL:  77 y.o.-year-old patient lying in the bed with no acute distress.  LUNGS: Normal breath sounds bilaterally, no wheezing, rales,rhonchi or crepitation. No use of accessory muscles of respiration.  CARDIOVASCULAR: S1, S2 normal.  No murmurs, rubs, or gallops.  ABDOMEN: Soft, non-tender, non-distended. Bowel sounds present. No organomegaly or mass.  NEUROLOGIC: Moves all 4 extremities. PSYCHIATRIC: The patient is alert and oriented x 3.  SKIN: No obvious rash, lesion, or ulcer.   DATA REVIEW:   CBC Recent Labs  Lab 08/09/18 1156  WBC 10.3  HGB 11.9*  HCT 35.2  PLT 261    Chemistries  Recent Labs  Lab 08/08/18 1157 08/09/18 1156  NA 137  --   K 3.5  --   CL 96*  --   CO2 31  --   GLUCOSE 115*  --   BUN 14  --   CREATININE 0.72 0.69  CALCIUM 9.2  --     Cardiac Enzymes Recent Labs  Lab 08/08/18 1157  TROPONINI <0.03    Microbiology Results  Results for orders placed or performed during the hospital encounter of 08/08/18  Urine culture     Status: Abnormal   Collection Time: 08/08/18  2:41 PM  Result Value Ref Range Status   Specimen Description   Final    URINE, RANDOM Performed at [facility], [address], [place], [place] [address]    Special Requests   Final    NONE Performed at [facility], [address] [place], [place] [address]     Culture (A)  Final    <10,000 COLONIES/mL INSIGNIFICANT GROWTH Performed at [facility], [place] [address], [place], [place] [address]    Report Status 08/10/2018 FINAL  Final  MRSA PCR Screening     Status: None   Collection Time: 08/08/18  4:41 PM  Result Value Ref Range Status   MRSA by PCR NEGATIVE NEGATIVE Final    Comment:        The GeneXpert MRSA Assay (FDA approved for NASAL specimens only), is one component of a comprehensive MRSA colonization surveillance program. It is not intended to diagnose MRSA infection nor to guide or monitor treatment for MRSA infections. Performed at [facility], [place], [place] [address]     RADIOLOGY:  Ct Angio Chest Pe W And/or Wo Contrast  Result Date: 08/08/2018 CLINICAL DATA:  77 year old female with acute shortness of breath and weakness for 2 days. EXAM: CT ANGIOGRAPHY CHEST WITH CONTRAST TECHNIQUE: Multidetector CT imaging of the chest was performed using the standard protocol during bolus administration of intravenous contrast. Multiplanar CT image reconstructions and MIPs were obtained to evaluate the vascular anatomy. CONTRAST:  54mL OMNIPAQUE IOHEXOL 350 MG/ML SOLN COMPARISON:  None. FINDINGS: Cardiovascular: This is a technically satisfactory study. No pulmonary emboli are identified. Mild cardiomegaly noted. Coronary artery and aortic atherosclerotic calcifications noted without thoracic aortic aneurysm. A small pericardial effusion is unchanged. Mediastinum/Nodes: No enlarged mediastinal, hilar, or axillary lymph nodes. Thyroid gland, trachea, and esophagus demonstrate no significant findings. Lungs/Pleura: A 1.2 x 1.6 cm SUPERIOR segment LEFT LOWER lobe nodule (series 6: Image 23) has a more solid appearance when compared to 10/30/2017 but is relatively unchanged from 07/17/2017 PET CT. A 1.3 x 1.5 cm peripheral nodular opacity within the SUPERIOR segment of the RIGHT LOWER lobe (6:35) is also more  confluent/solid-appearing. Bilateral UPPER lung confluence opacities/scarring are unchanged. No new pulmonary opacities are identified. No pleural effusion or pneumothorax. Upper Abdomen: No acute abnormality. Musculoskeletal: No acute abnormalities. No suspicious focal bony lesions. Review of the MIP images confirms the above findings. IMPRESSION: 1. No evidence of acute abnormality. No evidence of pulmonary emboli or thoracic  aortic aneurysm. 2. 1.2 x 1.6 cm SUPERIOR segment LEFT LOWER lobe nodule and 1.3 x 1.5 cm SUPERIOR segment RIGHT LOWER lobe nodule, both which appear more confluence/solid. Consider close CT follow-up or PET-CT. 3. Emphysema/COPD changes with unchanged bilateral UPPER lung opacity/scarring. 4. Cardiomegaly, coronary artery and Aortic Atherosclerosis (ICD10-I70.0). Electronically Signed   By: Margarette Canada M.D.   On: 08/08/2018 13:53    Follow up with PCP in 1 week.  Management plans discussed with the patient, family and they are in agreement.  CODE STATUS: Full code Code Status History    Date Active Date Inactive Code Status Order ID Comments User Context   12/29/2016 0146 12/29/2016 1429 Full Code [id number]  Lance Coon, MD Inpatient   07/19/2016 2256 07/20/2016 1635 Full Code [id number]  Harvie Bridge, DO Inpatient   12/06/2015 0200 12/07/2015 1534 Full Code [id number]  Harrie Foreman, MD Inpatient   12/03/2015 0049 12/04/2015 1721 Full Code [id number]  Hower, Aaron Mose, MD ED   05/12/2015 1509 05/18/2015 1239 Full Code [id number]  Dustin Flock, MD Inpatient      TOTAL TIME TAKING CARE OF THIS PATIENT ON DAY OF DISCHARGE: more than 34 minutes.   Saundra Shelling M.D on 08/10/2018 at 1:07 PM  Between 7am to 6pm - Pager - [phone]  After 6pm go to [email or web address] - password [email or web address] [facility]  [facility]  Office  [phone]  CC: Primary care physician; Robin Hector, MD  Note: This dictation was prepared with Dragon dictation along with smaller phrase  technology. Any transcriptional errors that result from this process are unintentional.

## 2018-08-10 NOTE — Discharge Planning (Signed)
Patient's IV removed.  RN assessment and VS revealed stability for DC to home with [facility].  Discharge papers given, explained and educated.  Informed of suggested FU appt and appt made. Scripts printed and given.  Ambulated prior to DC and maintained SPO2 of 90% or greater on 2L [place] chronic.  Once ready, will be wheeled to front and family transporting home via car.

## 2018-08-10 NOTE — Care Management Note (Signed)
Case Management Note  Patient Details  Name: Robin Hudson MRN: [id number] Date of Birth: 12-13-1940   Patient admitted from home, lives with husband, with COPD exacerbation.  Home health offered for COPD protocol, patient agreable to home health services.  Choice of home health agency offered, patient has no preference.  Home health set up with [facility].  PT recommended RW with 5 inch wheels, offered to get recommended DME but patient states she already has a walker at home that her children obtained for her and her husband.  Patient also states she has a wheelchair. COPD protocol signed by Dr. Estanislado Pandy and given to [facility] with [facility].  Home O2 with [employer]- Husband has her home oxygen tank at the bedside for patient to be discharged with.  Patient verified PCP as Dr. Ramonita Lab.  Primary pharmacy [facility].  Patient states that she is currently in the Medicare coverage gap because of her prescription for Advair.  Her Advair is currently $99 but she is able to afford this as of now.  Patient is able to drive herself to appointments or her husband is also available to drive if necessary.   Subjective/Objective:                   Action/Plan:   Expected Discharge Date:  08/10/18               Expected Discharge Plan:  [name]  In-House Referral:     Discharge planning Services  CM Consult  Post Acute Care Choice:  Home Health Choice offered to:  Patient, Spouse  DME Arranged:    DME Agency:     HH Arranged:  RN, PT [place] Agency:  [place]  Status of Service:  Completed, signed off  If discussed at [place] of Stay Meetings, dates discussed:    Additional Comments:  [name], RN 08/10/2018, 10:51 AM

## 2018-08-10 NOTE — Progress Notes (Signed)
Initial Nutrition Assessment  DOCUMENTATION CODES:    INTERVENTION:  Pt discharged before being seen   NUTRITION DIAGNOSIS:    GOAL:      MONITOR:   REASON FOR ASSESSMENT:   Consult Assessment of nutrition requirement/status  ASSESSMENT: Pt was out of room at first visit and then discharged. Unable to see pt    Pt admitted for COPD exaberation. Pt w/ malignant pulmonary nodule seeing outpt radiation. PMH: HLD, colon cancer w/ partial colectomy, breast cancer w/ rt lumpectomy, GERD   Pt was out of room at first visit and then discharged. Unable to see pt  Medications Reviewed and Include: Cholecalciferol, cyanocbalamin, protonix, miralax, methylprednisolone, hydrocodone-acetaminophen.  Labs reviewed and include: Glucose 115 (H)  NUTRITION - FOCUSED PHYSICAL EXAM:   Diet Order:   Diet Order            Diet Heart Room service appropriate? Yes; Fluid consistency: Thin  Diet effective now              EDUCATION NEEDS:  Skin:  Skin Assessment: Reviewed RN Assessment(dry, ecchymosis rt/left arm)  Last BM:  9/20(per RN note)  Height:   Ht Readings from Last 1 Encounters:  08/09/18 5' 4.02" (1.626 m)    Weight:   Wt Readings from Last 1 Encounters:  08/09/18 44.9 kg    Ideal Body Weight:  54.5 kg  BMI:  Body mass index is 16.98 kg/m.  Estimated Nutritional Needs:   Kcal:  Protein:  Fluid:      Lajuan Lines, RD, LDN  After Hours/Weekend Pager: [phone]

## 2018-08-11 LAB — HIV ANTIBODY (ROUTINE TESTING W REFLEX): HIV SCREEN 4TH GENERATION: NONREACTIVE

## 2018-08-19 DIAGNOSIS — R918 Other nonspecific abnormal finding of lung field: Secondary | ICD-10-CM | POA: Diagnosis not present

## 2018-08-19 DIAGNOSIS — J439 Emphysema, unspecified: Secondary | ICD-10-CM | POA: Diagnosis not present

## 2018-08-19 DIAGNOSIS — F329 Major depressive disorder, single episode, unspecified: Secondary | ICD-10-CM | POA: Diagnosis not present

## 2018-08-19 DIAGNOSIS — I10 Essential (primary) hypertension: Secondary | ICD-10-CM | POA: Diagnosis not present

## 2018-08-19 DIAGNOSIS — Z09 Encounter for follow-up examination after completed treatment for conditions other than malignant neoplasm: Secondary | ICD-10-CM | POA: Diagnosis not present

## 2018-09-03 DIAGNOSIS — J449 Chronic obstructive pulmonary disease, unspecified: Secondary | ICD-10-CM | POA: Diagnosis not present

## 2018-09-04 DIAGNOSIS — M5416 Radiculopathy, lumbar region: Secondary | ICD-10-CM | POA: Diagnosis not present

## 2018-09-04 DIAGNOSIS — M5136 Other intervertebral disc degeneration, lumbar region: Secondary | ICD-10-CM | POA: Diagnosis not present

## 2018-09-04 DIAGNOSIS — M62838 Other muscle spasm: Secondary | ICD-10-CM | POA: Diagnosis not present

## 2018-09-12 DIAGNOSIS — J441 Chronic obstructive pulmonary disease with (acute) exacerbation: Secondary | ICD-10-CM | POA: Diagnosis not present

## 2018-09-12 DIAGNOSIS — R0602 Shortness of breath: Secondary | ICD-10-CM | POA: Diagnosis not present

## 2018-09-13 ENCOUNTER — Observation Stay
Admission: EM | Admit: 2018-09-13 | Discharge: 2018-09-14 | Disposition: A | Discharge status: Home / Self Care | Payer: PPO | Attending: Internal Medicine | Admitting: Internal Medicine

## 2018-09-13 ENCOUNTER — Emergency Department: Payer: PPO

## 2018-09-13 ENCOUNTER — Encounter: Payer: Self-pay | Admitting: Emergency Medicine

## 2018-09-13 ENCOUNTER — Other Ambulatory Visit: Payer: Self-pay

## 2018-09-13 DIAGNOSIS — Z7951 Long term (current) use of inhaled steroids: Secondary | ICD-10-CM | POA: Insufficient documentation

## 2018-09-13 DIAGNOSIS — Z9011 Acquired absence of right breast and nipple: Secondary | ICD-10-CM | POA: Diagnosis not present

## 2018-09-13 DIAGNOSIS — D649 Anemia, unspecified: Secondary | ICD-10-CM | POA: Diagnosis not present

## 2018-09-13 DIAGNOSIS — F419 Anxiety disorder, unspecified: Secondary | ICD-10-CM | POA: Insufficient documentation

## 2018-09-13 DIAGNOSIS — F17210 Nicotine dependence, cigarettes, uncomplicated: Secondary | ICD-10-CM | POA: Insufficient documentation

## 2018-09-13 DIAGNOSIS — E43 Unspecified severe protein-calorie malnutrition: Secondary | ICD-10-CM | POA: Insufficient documentation

## 2018-09-13 DIAGNOSIS — J9601 Acute respiratory failure with hypoxia: Secondary | ICD-10-CM | POA: Insufficient documentation

## 2018-09-13 DIAGNOSIS — Z885 Allergy status to narcotic agent status: Secondary | ICD-10-CM | POA: Diagnosis not present

## 2018-09-13 DIAGNOSIS — R739 Hyperglycemia, unspecified: Secondary | ICD-10-CM | POA: Diagnosis not present

## 2018-09-13 DIAGNOSIS — Z85118 Personal history of other malignant neoplasm of bronchus and lung: Secondary | ICD-10-CM | POA: Insufficient documentation

## 2018-09-13 DIAGNOSIS — Z85038 Personal history of other malignant neoplasm of large intestine: Secondary | ICD-10-CM | POA: Diagnosis not present

## 2018-09-13 DIAGNOSIS — R0602 Shortness of breath: Secondary | ICD-10-CM | POA: Diagnosis not present

## 2018-09-13 DIAGNOSIS — I509 Heart failure, unspecified: Secondary | ICD-10-CM | POA: Diagnosis not present

## 2018-09-13 DIAGNOSIS — E785 Hyperlipidemia, unspecified: Secondary | ICD-10-CM | POA: Insufficient documentation

## 2018-09-13 DIAGNOSIS — E538 Deficiency of other specified B group vitamins: Secondary | ICD-10-CM | POA: Insufficient documentation

## 2018-09-13 DIAGNOSIS — Z888 Allergy status to other drugs, medicaments and biological substances status: Secondary | ICD-10-CM | POA: Diagnosis not present

## 2018-09-13 DIAGNOSIS — F329 Major depressive disorder, single episode, unspecified: Secondary | ICD-10-CM | POA: Insufficient documentation

## 2018-09-13 DIAGNOSIS — Z853 Personal history of malignant neoplasm of breast: Secondary | ICD-10-CM | POA: Diagnosis not present

## 2018-09-13 DIAGNOSIS — Z9221 Personal history of antineoplastic chemotherapy: Secondary | ICD-10-CM | POA: Insufficient documentation

## 2018-09-13 DIAGNOSIS — J441 Chronic obstructive pulmonary disease with (acute) exacerbation: Secondary | ICD-10-CM | POA: Diagnosis not present

## 2018-09-13 DIAGNOSIS — Z7982 Long term (current) use of aspirin: Secondary | ICD-10-CM | POA: Diagnosis not present

## 2018-09-13 DIAGNOSIS — I110 Hypertensive heart disease with heart failure: Secondary | ICD-10-CM | POA: Insufficient documentation

## 2018-09-13 DIAGNOSIS — Z79899 Other long term (current) drug therapy: Secondary | ICD-10-CM | POA: Insufficient documentation

## 2018-09-13 LAB — MAGNESIUM: Magnesium: 1.9 mg/dL (ref 1.7–2.4)

## 2018-09-13 LAB — COMPREHENSIVE METABOLIC PANEL
ALT: 13 U/L (ref 0–44)
ANION GAP: 8 (ref 5–15)
AST: 19 U/L (ref 15–41)
Albumin: 3.7 g/dL (ref 3.5–5.0)
Alkaline Phosphatase: 59 U/L (ref 38–126)
BUN: 13 mg/dL (ref 8–23)
CALCIUM: 8.6 mg/dL — AB (ref 8.9–10.3)
CHLORIDE: 101 mmol/L (ref 98–111)
CO2: 30 mmol/L (ref 22–32)
CREATININE: 0.67 mg/dL (ref 0.44–1.00)
Glucose, Bld: 108 mg/dL — ABNORMAL HIGH (ref 70–99)
Potassium: 3.8 mmol/L (ref 3.5–5.1)
SODIUM: 139 mmol/L (ref 135–145)
Total Bilirubin: 0.4 mg/dL (ref 0.3–1.2)
Total Protein: 6.4 g/dL — ABNORMAL LOW (ref 6.5–8.1)

## 2018-09-13 LAB — CBC WITH DIFFERENTIAL/PLATELET
ABS IMMATURE GRANULOCYTES: 0.08 10*3/uL — AB (ref 0.00–0.07)
Basophils Absolute: 0.1 10*3/uL (ref 0.0–0.1)
Basophils Relative: 1 %
Eosinophils Absolute: 0.2 10*3/uL (ref 0.0–0.5)
Eosinophils Relative: 1 %
HCT: 36.5 % (ref 36.0–46.0)
Hemoglobin: 11.3 g/dL — ABNORMAL LOW (ref 12.0–15.0)
IMMATURE GRANULOCYTES: 1 %
LYMPHS ABS: 1.4 10*3/uL (ref 0.7–4.0)
Lymphocytes Relative: 11 %
MCH: 28.3 pg (ref 26.0–34.0)
MCHC: 31 g/dL (ref 30.0–36.0)
MCV: 91.3 fL (ref 80.0–100.0)
MONOS PCT: 10 %
Monocytes Absolute: 1.2 10*3/uL — ABNORMAL HIGH (ref 0.1–1.0)
NEUTROS ABS: 10.1 10*3/uL — AB (ref 1.7–7.7)
NEUTROS PCT: 76 %
PLATELETS: 224 10*3/uL (ref 150–400)
RBC: 4 MIL/uL (ref 3.87–5.11)
RDW: 13.6 % (ref 11.5–15.5)
WBC: 13.1 10*3/uL — ABNORMAL HIGH (ref 4.0–10.5)
nRBC: 0 % (ref 0.0–0.2)

## 2018-09-13 LAB — TROPONIN I: Troponin I: <0.03 ng/mL (ref <0.03)

## 2018-09-13 LAB — MRSA PCR SCREENING: MRSA BY PCR: NEGATIVE

## 2018-09-13 LAB — PREALBUMIN: PREALBUMIN: 15.5 mg/dL — AB (ref 18–38)

## 2018-09-13 LAB — PHOSPHORUS: PHOSPHORUS: 3.2 mg/dL (ref 2.5–4.6)

## 2018-09-13 MED ORDER — LOSARTAN POTASSIUM 50 MG PO TABS
50.0000 mg | ORAL_TABLET | Freq: Every day | ORAL | Status: DC
  Administered 2018-09-13 – 2018-09-14 (×2): 50 mg via ORAL
  Filled 2018-09-13 (×2): qty 1

## 2018-09-13 MED ORDER — ONDANSETRON HCL 4 MG PO TABS: 4.0000 mg | ORAL_TABLET | Freq: Four times a day (QID) | ORAL | Status: DC | PRN

## 2018-09-13 MED ORDER — ALPRAZOLAM 0.5 MG PO TABS
0.5000 mg | ORAL_TABLET | Freq: Two times a day (BID) | ORAL | Status: DC | PRN
  Administered 2018-09-13 – 2018-09-14 (×2): 0.5 mg via ORAL
  Filled 2018-09-13 (×2): qty 1

## 2018-09-13 MED ORDER — ENOXAPARIN SODIUM 40 MG/0.4ML ~~LOC~~ SOLN
40.0000 mg | SUBCUTANEOUS | Status: DC
  Administered 2018-09-13: 40 mg via SUBCUTANEOUS
  Filled 2018-09-13: qty 0.4

## 2018-09-13 MED ORDER — HYDROCODONE-ACETAMINOPHEN 5-325 MG PO TABS
1.0000 | ORAL_TABLET | Freq: Once | ORAL | Status: AC
  Administered 2018-09-13: 1 via ORAL
  Filled 2018-09-13: qty 1

## 2018-09-13 MED ORDER — HYDROCODONE-ACETAMINOPHEN 10-325 MG PO TABS
1.0000 | ORAL_TABLET | ORAL | Status: DC | PRN
  Administered 2018-09-13 – 2018-09-14 (×4): 1 via ORAL
  Filled 2018-09-13 (×4): qty 1

## 2018-09-13 MED ORDER — ACETAMINOPHEN 325 MG PO TABS: 650.0000 mg | ORAL_TABLET | Freq: Four times a day (QID) | ORAL | Status: DC | PRN

## 2018-09-13 MED ORDER — VITAMIN D3 25 MCG (1000 UNIT) PO TABS
1000.0000 [IU] | ORAL_TABLET | Freq: Every day | ORAL | Status: DC
  Administered 2018-09-13 – 2018-09-14 (×2): 1000 [IU] via ORAL
  Filled 2018-09-13 (×2): qty 1

## 2018-09-13 MED ORDER — ACETAMINOPHEN 650 MG RE SUPP: 650.0000 mg | Freq: Four times a day (QID) | RECTAL | Status: DC | PRN

## 2018-09-13 MED ORDER — IPRATROPIUM-ALBUTEROL 0.5-2.5 (3) MG/3ML IN SOLN
3.0000 mL | Freq: Once | RESPIRATORY_TRACT | Status: AC
  Administered 2018-09-13: 3 mL via RESPIRATORY_TRACT
  Filled 2018-09-13: qty 3

## 2018-09-13 MED ORDER — IPRATROPIUM-ALBUTEROL 0.5-2.5 (3) MG/3ML IN SOLN: 3.0000 mL | RESPIRATORY_TRACT | Status: DC | PRN

## 2018-09-13 MED ORDER — LIDOCAINE 5 % EX PTCH
MEDICATED_PATCH | CUTANEOUS | Status: AC
  Administered 2018-09-13: 1 via TRANSDERMAL
  Filled 2018-09-13: qty 1

## 2018-09-13 MED ORDER — ATENOLOL 25 MG PO TABS
25.0000 mg | ORAL_TABLET | Freq: Every day | ORAL | Status: DC
  Administered 2018-09-13 – 2018-09-14 (×2): 25 mg via ORAL
  Filled 2018-09-13 (×2): qty 1

## 2018-09-13 MED ORDER — MELATONIN 5 MG PO TABS
5.0000 mg | ORAL_TABLET | Freq: Every evening | ORAL | Status: DC | PRN
  Filled 2018-09-13: qty 1

## 2018-09-13 MED ORDER — HYDROXYCHLOROQUINE SULFATE 200 MG PO TABS
400.0000 mg | ORAL_TABLET | Freq: Every day | ORAL | Status: DC
  Administered 2018-09-13 – 2018-09-14 (×2): 400 mg via ORAL
  Filled 2018-09-13 (×2): qty 2

## 2018-09-13 MED ORDER — MOMETASONE FURO-FORMOTEROL FUM 200-5 MCG/ACT IN AERO
2.0000 | INHALATION_SPRAY | Freq: Two times a day (BID) | RESPIRATORY_TRACT | Status: DC
  Administered 2018-09-13 (×2): 2 via RESPIRATORY_TRACT
  Filled 2018-09-13: qty 8.8

## 2018-09-13 MED ORDER — INFLUENZA VAC SPLIT HIGH-DOSE 0.5 ML IM SUSY
0.5000 mL | PREFILLED_SYRINGE | INTRAMUSCULAR | Status: DC
  Filled 2018-09-13: qty 0.5

## 2018-09-13 MED ORDER — ENOXAPARIN SODIUM 30 MG/0.3ML ~~LOC~~ SOLN
30.0000 mg | SUBCUTANEOUS | Status: DC
  Administered 2018-09-14: 30 mg via SUBCUTANEOUS
  Filled 2018-09-13: qty 0.3

## 2018-09-13 MED ORDER — LIDOCAINE 5 % EX PTCH
1.0000 | MEDICATED_PATCH | CUTANEOUS | Status: DC
  Administered 2018-09-13 – 2018-09-14 (×2): 1 via TRANSDERMAL
  Filled 2018-09-13: qty 1

## 2018-09-13 MED ORDER — BISACODYL 5 MG PO TBEC: 5.0000 mg | DELAYED_RELEASE_TABLET | Freq: Every day | ORAL | Status: DC | PRN

## 2018-09-13 MED ORDER — SENNOSIDES-DOCUSATE SODIUM 8.6-50 MG PO TABS: 1.0000 | ORAL_TABLET | Freq: Every evening | ORAL | Status: DC | PRN

## 2018-09-13 MED ORDER — ONDANSETRON HCL 4 MG/2ML IJ SOLN
4.0000 mg | Freq: Four times a day (QID) | INTRAMUSCULAR | Status: DC | PRN
  Administered 2018-09-13: 4 mg via INTRAVENOUS
  Filled 2018-09-13: qty 2

## 2018-09-13 MED ORDER — IPRATROPIUM-ALBUTEROL 0.5-2.5 (3) MG/3ML IN SOLN
3.0000 mL | Freq: Four times a day (QID) | RESPIRATORY_TRACT | Status: DC
  Administered 2018-09-13 – 2018-09-14 (×4): 3 mL via RESPIRATORY_TRACT
  Filled 2018-09-13 (×4): qty 3

## 2018-09-13 MED ORDER — METHYLPREDNISOLONE SODIUM SUCC 125 MG IJ SOLR
60.0000 mg | Freq: Two times a day (BID) | INTRAMUSCULAR | Status: AC
  Administered 2018-09-13 (×2): 60 mg via INTRAVENOUS
  Filled 2018-09-13 (×2): qty 2

## 2018-09-13 MED ORDER — BUTALBITAL-APAP-CAFFEINE 50-325-40 MG PO TABS
1.0000 | ORAL_TABLET | Freq: Four times a day (QID) | ORAL | Status: DC | PRN
  Filled 2018-09-13: qty 1

## 2018-09-13 MED ORDER — PREDNISONE 20 MG PO TABS
40.0000 mg | ORAL_TABLET | Freq: Every day | ORAL | Status: DC
  Administered 2018-09-14: 40 mg via ORAL
  Filled 2018-09-13: qty 2

## 2018-09-13 NOTE — ED Triage Notes (Signed)
EMS pt to Rm 25 from home with report of increasing shortness of breath over the last few days. Pt on O2 at 2l/min via [place] all the time. Uses an oxygen concentrator but felt it may not have been working well so changed to her O2 tank. EMS reports BP of 140/92, SR at 84 with O2 sats of 93%. CO2 30 to 35. 20 gauge SL to left wrist. Gave 125 mg of solumedrol and 2 duonebs. Wheezing and rhonchi throughout.

## 2018-09-13 NOTE — ED Notes (Signed)
Pt requesting something to put on her painful lower back. States at home she uses Aspercreme with lidocaine OTC. Will ask admitting MD for orders.

## 2018-09-13 NOTE — ED Notes (Signed)
Ambulated pt in room. Pt 92% on 2L. Pt noticed to be short of breath while ambulating. MD notified.

## 2018-09-13 NOTE — Progress Notes (Signed)
PHARMACIST - PHYSICIAN COMMUNICATION  CONCERNING:  Enoxaparin (Lovenox) for DVT Prophylaxis    RECOMMENDATION: Patient was prescribed enoxaprin 40mg  q24 hours for VTE prophylaxis.   Filed Weights   09/13/18 0116 09/13/18 0607  Weight: 99 lb (44.9 kg) 94 lb 3.2 oz (42.7 kg)    Body mass index is 16.17 kg/m.  Estimated Creatinine Clearance: 40.3 mL/min (by C-G formula based on SCr of 0.67 mg/dL).   Patient is candidate for enoxaparin 30mg  every 24 hours based on  Weight less then 45kg for female.   DESCRIPTION: Pharmacy has adjusted enoxaparin dose.  Patient is now receiving enoxaparin 30mg  every 24 hours.  Pernell Dupre, PharmD, BCPS Clinical Pharmacist 09/13/2018 7:49 AM

## 2018-09-13 NOTE — ED Provider Notes (Signed)
[facility] Emergency Department Provider Note   ____________________________________________   First MD Initiated Contact with Patient 09/13/18 0113     (approximate)  I have reviewed the triage vital signs and the nursing notes.   HISTORY  Chief Complaint Shortness of breath   HPI Robin Hudson is a 77 y.o. female brought to the ED from home via EMS with a chief complaint of shortness of breath.  Patient has a history of COPD, has oxygen at home which she uses as needed.  Complains of a 1 day history of nonproductive cough with progressive shortness of breath.  Received 2 DuoNeb's and 125 mg IV Solu-Medrol per EMS prior to arrival.  Denies associated fever, chills, chest pain, abdominal pain, nausea or vomiting.  Denies recent travel or trauma.   Past Medical History:  Diagnosis Date  . Allergic rhinitis   . Anxiety   . Asthma   . B12 deficiency   . Breast cancer ([place]) 1999   RT LUMPECTOMY  . Carcinoma of right breast treated with adjuvant chemotherapy ([place]) 1999   RT LUMPECTOMY  . CHF (congestive heart failure) ([place])   . Colon cancer ([place]) 2010  . Colon cancer ([place])   . COPD (chronic obstructive pulmonary disease) ([place])   . Hyperlipemia   . Hypertension   . MRSA pneumonia ([place])   . Osteoporosis   . Radiation 1999   BREAST CA  . Right adrenal mass [facility])     Patient Active Problem List   Diagnosis Date Noted  . Acute exacerbation of chronic obstructive pulmonary disease (COPD) ([place]) 08/08/2018  . Leukocytosis 12/29/2016  . Cough 12/29/2016  . Hypoxia 12/06/2015  . COPD exacerbation ([place]) 12/03/2015  . Elevated troponin 12/03/2015  . Elevated antinuclear antibody (ANA) level 05/15/2015  . Shortness of breath 05/12/2015  . Hyperlipemia 05/12/2015  . COPD (chronic obstructive pulmonary disease) ([place]) 05/12/2015  . HTN (hypertension) 05/12/2015  . Osteoporosis 05/12/2015  . Right adrenal mass ([place]) 05/12/2015  . B12 deficiency  05/12/2015  . Allergic rhinitis 05/12/2015  . Breast cancer ([place]) 05/12/2015  . Colon cancer ([address]) 05/12/2015  . Closed head injury 12/15/2014    Past Surgical History:  Procedure Laterality Date  . ABDOMINAL HYSTERECTOMY    . APPENDECTOMY    . BREAST SURGERY Right    lumpectomy x2 with lymph nodes  . CARPAL TUNNEL RELEASE Left 06/04/2016   Procedure: CARPAL TUNNEL RELEASE;  Surgeon: Hessie Knows, MD;  Location: [facility] ORS;  Service: Orthopedics;  Laterality: Left;  . colectomy     partial  . IMAGE GUIDED SINUS SURGERY    . OPEN REDUCTION INTERNAL FIXATION (ORIF) DISTAL RADIAL FRACTURE Left 06/04/2016   Procedure: OPEN REDUCTION INTERNAL FIXATION (ORIF) DISTAL RADIAL FRACTURE;  Surgeon: Hessie Knows, MD;  Location: [facility] ORS;  Service: Orthopedics;  Laterality: Left;    Prior to Admission medications   Medication Sig Start Date End Date Taking? Authorizing Provider  albuterol (PROAIR HFA) 108 (90 Base) MCG/ACT inhaler INHALE 2 PUFFS BY MOUTH INTO THE LUNGS EVERY 6 HOURS AS NEEDED FOR WHEEZING 10/31/17   [provider]  albuterol (PROVENTIL) (2.5 MG/3ML) 0.083% nebulizer solution Take 2.5 mg by nebulization every 6 (six) hours as needed for wheezing or shortness of breath.    [provider]  ALPRAZolam Duanne Moron) 0.5 MG tablet Take 1 tablet by mouth 2 (two) times daily as needed for anxiety.  04/19/15   [provider]  aspirin EC 81 MG tablet Take 1  tablet (81 mg total) by mouth daily. 06/28/16   Fritzi Mandes, MD  atenolol (TENORMIN) 50 MG tablet Take 1 tablet (50 mg total) by mouth daily. Patient taking differently: Take 25 mg by mouth daily.  05/18/15   Tama High III, MD  azelastine (ASTELIN) 0.1 % nasal spray Place 1 spray into both nostrils 2 (two) times daily. Use in each nostril as directed    [provider]  butalbital-acetaminophen-caffeine (FIORICET, ESGIC) 50-325-40 MG tablet Take 1 tablet by mouth every 6 (six) hours as needed.  12/19/17    [provider]  cholecalciferol (VITAMIN D) 1000 units tablet Take 1,000 Units by mouth daily.    [provider]  cyanocobalamin (,VITAMIN B-12,) 1000 MCG/ML injection Inject 1,000 mcg into the muscle every 30 (thirty) days.  03/26/16   [provider]  Fluticasone-Salmeterol (ADVAIR DISKUS) 250-50 MCG/DOSE AEPB INHALE 1 PUFF INTO THE LUNGS EVERY 12 HOURS 12/22/17   [provider]  HYDROcodone-acetaminophen (NORCO) 7.5-325 MG tablet Take 1 tablet by mouth 3 (three) times daily as needed for moderate pain.    [provider]  hydroxychloroquine (PLAQUENIL) 200 MG tablet Take 400 mg by mouth daily.    [provider]  losartan (COZAAR) 50 MG tablet Take 1 tablet (50 mg total) by mouth daily. 05/18/15   Tama High III, MD  pantoprazole (PROTONIX) 20 MG tablet Take 1 tablet (20 mg total) 2 (two) times daily by mouth. 09/23/17 03/04/18  Merlyn Lot, MD  predniSONE (DELTASONE) 10 MG tablet Take 1 tablet (10 mg total) by mouth daily. Label  & dispense according to the schedule below.  6 tablets day one, then 5 table day 2, then 4 tablets day 3, then 3 tablets day 4, 2 tablets day 5, then 1 tablet day 6, then stop 08/10/18   Saundra Shelling, MD    Allergies Ace inhibitors; Alendronate; Coreg [carvedilol]; Duloxetine; Morphine; Pregabalin; Statins; and Verapamil  Family History  Problem Relation Age of Onset  . Heart attack Mother   . Breast cancer Other   . Breast cancer Sister 57  . Breast cancer Sister 59    Social History Social History   Tobacco Use  . Smoking status: Current Some Day Smoker    Packs/day: 0.25    Types: Cigarettes  . Smokeless tobacco: Never Used  Substance Use Topics  . Alcohol use: No  . Drug use: No    Review of Systems  Constitutional: No fever/chills Eyes: No visual changes. ENT: No sore throat. Cardiovascular: Denies chest pain. Respiratory: Positive for cough and shortness of  breath. Gastrointestinal: No abdominal pain.  No nausea, no vomiting.  No diarrhea.  No constipation. Genitourinary: Negative for dysuria. Musculoskeletal: Negative for back pain. Skin: Negative for rash. Neurological: Negative for headaches, focal weakness or numbness.   ____________________________________________   PHYSICAL EXAM:  VITAL SIGNS: ED Triage Vitals  Enc Vitals Group     BP      Pulse      Resp      Temp      Temp src      SpO2      Weight      Height      Head Circumference      Peak Flow      Pain Score      Pain Loc      Pain Edu?      Excl. in [place]?     Constitutional: Alert and oriented.  Cachectic  appearing and in mild acute distress. Eyes: Conjunctivae are normal. PERRL. EOMI. Head: Atraumatic. Nose: No congestion/rhinnorhea. Mouth/Throat: Mucous membranes are moist.  Oropharynx non-erythematous. Neck: No stridor.   Cardiovascular: Normal rate, regular rhythm. Grossly normal heart sounds.  Good peripheral circulation. Respiratory: Normal respiratory effort.  No retractions. Lungs with scattered rhonchi. Gastrointestinal: Soft and nontender. No distention. No abdominal bruits. No CVA tenderness. Musculoskeletal: No lower extremity tenderness nor edema.  No joint effusions. Neurologic:  Normal speech and language. No gross focal neurologic deficits are appreciated.  Skin:  Skin is warm, dry and intact. No rash noted. Psychiatric: Mood and affect are normal. Speech and behavior are normal.  ____________________________________________   LABS (all labs ordered are listed, but only abnormal results are displayed)  Labs Reviewed  CBC WITH DIFFERENTIAL/PLATELET - Abnormal; Notable for the following components:      Result Value   WBC 13.1 (*)    Hemoglobin 11.3 (*)    Neutro Abs 10.1 (*)    Monocytes Absolute 1.2 (*)    Abs Immature Granulocytes 0.08 (*)    All other components within normal limits  COMPREHENSIVE METABOLIC PANEL - Abnormal;  Notable for the following components:   Glucose, Bld 108 (*)    Calcium 8.6 (*)    Total Protein 6.4 (*)    All other components within normal limits  TROPONIN I   ____________________________________________  EKG  ED ECG REPORT I, Harlene Petralia J, the attending physician, personally viewed and interpreted this ECG.   Date: 09/13/2018  EKG Time: 0118  Rate: 94  Rhythm: normal EKG, normal sinus rhythm  Axis: Normal  Intervals:none  ST&T Change: ST depression inferior laterally New ST depression V3-V6 compared to 07/2018  ____________________________________________  RADIOLOGY  ED MD interpretation: Scarring; no acute cardiopulmonary process  Official radiology report(s): Dg Chest Port 1 View  Result Date: 09/13/2018 CLINICAL DATA:  Shortness of breath EXAM: PORTABLE CHEST 1 VIEW COMPARISON:  Chest radiograph and chest CT August 08, 2018 FINDINGS: There is scarring with volume loss/asymmetric pleural thickening in the right apex. A lesser degree of scarring is noted in the left apex. There is also scarring in the right base. There is no frank edema or consolidation. The heart size is normal. The pulmonary vascularity is stable with a degree of distortion due to scarring. No adenopathy evident. There are surgical clips in the right axilla with evidence of prior mastectomy on the right. There is aortic atherosclerosis. No bone lesions. IMPRESSION: Areas of scarring in the upper lobes, more on the right than on the left. There is asymmetric apical pleural thickening on the right. No edema or consolidation. Stable cardiac silhouette. There is aortic atherosclerosis. Aortic Atherosclerosis (ICD10-I70.0). Electronically Signed   By: Lowella Grip III M.D.   On: 09/13/2018 02:18    ____________________________________________   PROCEDURES  Procedure(s) performed: None  Procedures  Critical Care performed: Yes, see critical care note(s)   CRITICAL CARE Performed by: Paulette Blanch   Total critical care time: 45 minutes  Critical care time was exclusive of separately billable procedures and treating other patients.  Critical care was necessary to treat or prevent imminent or life-threatening deterioration.  Critical care was time spent personally by me on the following activities: development of treatment plan with patient and/or surrogate as well as nursing, discussions with consultants, evaluation of patient's response to treatment, examination of patient, obtaining history from patient or surrogate, ordering and performing treatments and interventions, ordering and review of laboratory studies, ordering and  review of radiographic studies, pulse oximetry and re-evaluation of patient's condition.  ____________________________________________   INITIAL IMPRESSION / ASSESSMENT AND PLAN / ED COURSE  As part of my medical decision making, I reviewed the following data within the [place] notes reviewed and incorporated, Labs reviewed, EKG interpreted, Radiograph reviewed  and Notes from prior ED visits   77 year old female with COPD and CHF who presents with COPD exacerbation. Differential includes, but is not limited to, viral syndrome, bronchitis including COPD exacerbation, pneumonia, reactive airway disease including asthma, CHF including exacerbation with or without pulmonary/interstitial edema, pneumothorax, ACS, thoracic trauma, and pulmonary embolism.    Clinical Course as of Sep 14 451  Nancy Fetter Sep 13, 2018  0329 Tried to ambulate patient on her oxygen.  Oxygenation on 2 L remained 92%.  However, patient became quite tachypneic with audible wheezing.  Will administer third DuoNeb and discuss with hospitalist to evaluate patient in the emergency department for admission.   [JS]    Clinical Course User Index [JS] Paulette Blanch, MD     ____________________________________________   FINAL CLINICAL IMPRESSION(S) / ED  DIAGNOSES  Final diagnoses:  Shortness of breath  COPD exacerbation [facility])     ED Discharge Orders    None       Note:  This document was prepared using Dragon voice recognition software and may include unintentional dictation errors.    Paulette Blanch, MD 09/13/18 [phone]

## 2018-09-13 NOTE — H&P (Signed)
[facility] NAME: Robin Hudson    MR#:  [id number]  DATE OF BIRTH:  Mar 14, 1941  DATE OF ADMISSION:  09/13/2018  PRIMARY CARE PHYSICIAN: Adin Hector, MD   REQUESTING/REFERRING PHYSICIAN: Paulette Blanch, MD  CHIEF COMPLAINT:   Chief Complaint  Patient presents with  . Shortness of Breath    HISTORY OF PRESENT ILLNESS:  Robin Hudson  is a 77 y.o. female with a known history of COPD (2L [place] qHS + PRN) p/w acute COPD exacerbation, acute on chronic hypoxemic respiratory failure. AAOx3, good historian. Pt tells me she went to the grocery store @~1100AM on Saturday 09/12/2018. She noted some mild SOB/exertional dyspnea and decreased exercise tolerance. She states she had her portable oxygen generator with her, which she used. She went home and changed her oxygen over to a tank. She then turned on the oven to make something. She states she opened the oven, and the fumes worsened her SOB. She states that her dyspnea then proceeded to progressively increase over the course of the remainder of the evening. She endorses some mild cough, largely dry but occasionally productive of clear sputum. She denies chest pain or hemoptysis. She was wheezing on arrival to ED, and continues to exhibit some mild end-expiratory wheezing at the time of my assessment; however, she does not exhibit conversational dyspnea, tripoding, retractions or accessory muscle use. She is tachypneic and uncomfortable, but is not in distress. She has not required BiPAP, and has a stable SpO2 on room air (but has been placed on [place] for comfort). She continues to smoke, albeit minimally, ~1 pack/wk at max, though I have advised complete cessation. She otherwise endorses some lower back pain (chronic), but is otherwise w/o complaint.  PAST MEDICAL HISTORY:   Past Medical History:  Diagnosis Date  . Allergic rhinitis   . Anxiety   . Asthma   . B12 deficiency   . Breast cancer  ([place]) 1999   RT LUMPECTOMY  . Carcinoma of right breast treated with adjuvant chemotherapy ([place]) 1999   RT LUMPECTOMY  . CHF (congestive heart failure) ([place])   . Colon cancer ([place]) 2010  . Colon cancer ([place])   . COPD (chronic obstructive pulmonary disease) ([place])   . Hyperlipemia   . Hypertension   . MRSA pneumonia ([place])   . Osteoporosis   . Radiation 1999   BREAST CA  . Right adrenal mass ([place])     PAST SURGICAL HISTORY:   Past Surgical History:  Procedure Laterality Date  . ABDOMINAL HYSTERECTOMY    . APPENDECTOMY    . BREAST SURGERY Right    lumpectomy x2 with lymph nodes  . CARPAL TUNNEL RELEASE Left 06/04/2016   Procedure: CARPAL TUNNEL RELEASE;  Surgeon: Hessie Knows, MD;  Location: [facility] ORS;  Service: Orthopedics;  Laterality: Left;  . colectomy     partial  . IMAGE GUIDED SINUS SURGERY    . OPEN REDUCTION INTERNAL FIXATION (ORIF) DISTAL RADIAL FRACTURE Left 06/04/2016   Procedure: OPEN REDUCTION INTERNAL FIXATION (ORIF) DISTAL RADIAL FRACTURE;  Surgeon: Hessie Knows, MD;  Location: [facility] ORS;  Service: Orthopedics;  Laterality: Left;    SOCIAL HISTORY:   Social History   Tobacco Use  . Smoking status: Current Some Day Smoker    Packs/day: 0.25    Types: Cigarettes  . Smokeless tobacco: Never Used  Substance Use Topics  . Alcohol use: No    FAMILY HISTORY:   Family  History  Problem Relation Age of Onset  . Heart attack Mother   . Breast cancer Other   . Breast cancer Sister 56  . Breast cancer Sister 49    DRUG ALLERGIES:   Allergies  Allergen Reactions  . Ace Inhibitors     Other reaction(s): Cough  . Alendronate     Other reaction(s): Other (See Comments) GI UPSET  . Coreg [Carvedilol] Other (See Comments)    syncope  . Duloxetine Other (See Comments)  . Morphine Other (See Comments)    confusion  . Pregabalin     Other reaction(s): Other (See Comments) intolerant  . Statins Other (See Comments)  . Verapamil     Other reaction(s): Other  (See Comments) CONSTIPATION    REVIEW OF SYSTEMS:   Review of Systems  Constitutional: Negative for chills, diaphoresis, fever, malaise/fatigue and weight loss.  HENT: Negative for congestion, ear pain, hearing loss, nosebleeds, sinus pain, sore throat and tinnitus.   Eyes: Negative for blurred vision, double vision and photophobia.  Respiratory: Positive for cough, sputum production, shortness of breath and wheezing. Negative for hemoptysis.   Cardiovascular: Negative for chest pain, palpitations, orthopnea, claudication, leg swelling and PND.  Gastrointestinal: Negative for abdominal pain, blood in stool, constipation, diarrhea, heartburn, melena, nausea and vomiting.  Genitourinary: Negative for dysuria, frequency, hematuria and urgency.  Musculoskeletal: Positive for back pain. Negative for joint pain, myalgias and neck pain.  Skin: Negative for itching and rash.  Neurological: Negative for dizziness, tingling, tremors, sensory change, speech change, focal weakness, seizures, loss of consciousness, weakness and headaches.  Psychiatric/Behavioral: Negative for memory loss. The patient does not have insomnia.    MEDICATIONS AT HOME:   Prior to Admission medications   Medication Sig Start Date End Date Taking? Authorizing Provider  albuterol (PROAIR HFA) 108 (90 Base) MCG/ACT inhaler INHALE 2 PUFFS BY MOUTH INTO THE LUNGS EVERY 6 HOURS AS NEEDED FOR WHEEZING 10/31/17   [provider]  albuterol (PROVENTIL) (2.5 MG/3ML) 0.083% nebulizer solution Take 2.5 mg by nebulization every 6 (six) hours as needed for wheezing or shortness of breath.    [provider]  ALPRAZolam Duanne Moron) 0.5 MG tablet Take 1 tablet by mouth 2 (two) times daily as needed for anxiety.  04/19/15   [provider]  aspirin EC 81 MG tablet Take 1 tablet (81 mg total) by mouth daily. 06/28/16   Fritzi Mandes, MD  atenolol (TENORMIN) 50 MG tablet Take 1 tablet (50 mg total) by mouth daily. Patient  taking differently: Take 25 mg by mouth daily.  05/18/15   Tama High III, MD  azelastine (ASTELIN) 0.1 % nasal spray Place 1 spray into both nostrils 2 (two) times daily. Use in each nostril as directed    [provider]  butalbital-acetaminophen-caffeine (FIORICET, ESGIC) 50-325-40 MG tablet Take 1 tablet by mouth every 6 (six) hours as needed.  12/19/17   [provider]  cholecalciferol (VITAMIN D) 1000 units tablet Take 1,000 Units by mouth daily.    [provider]  cyanocobalamin (,VITAMIN B-12,) 1000 MCG/ML injection Inject 1,000 mcg into the muscle every 30 (thirty) days.  03/26/16   [provider]  Fluticasone-Salmeterol (ADVAIR DISKUS) 250-50 MCG/DOSE AEPB INHALE 1 PUFF INTO THE LUNGS EVERY 12 HOURS 12/22/17   [provider]  HYDROcodone-acetaminophen (NORCO) 7.5-325 MG tablet Take 1 tablet by mouth 3 (three) times daily as needed for moderate pain.    [provider]  hydroxychloroquine (PLAQUENIL) 200 MG tablet Take  400 mg by mouth daily.    [provider]  losartan (COZAAR) 50 MG tablet Take 1 tablet (50 mg total) by mouth daily. 05/18/15   Tama High III, MD  pantoprazole (PROTONIX) 20 MG tablet Take 1 tablet (20 mg total) 2 (two) times daily by mouth. 09/23/17 03/04/18  Merlyn Lot, MD  predniSONE (DELTASONE) 10 MG tablet Take 1 tablet (10 mg total) by mouth daily. Label  & dispense according to the schedule below.  6 tablets day one, then 5 table day 2, then 4 tablets day 3, then 3 tablets day 4, 2 tablets day 5, then 1 tablet day 6, then stop 08/10/18   Saundra Shelling, MD      VITAL SIGNS:  Blood pressure 122/60, pulse 95, temperature 97.7 F (36.5 C), temperature source Oral, resp. rate 14, height 5\' 4"  (1.626 m), weight 42.7 kg, SpO2 99 %.  PHYSICAL EXAMINATION:  Physical Exam  Constitutional: She is oriented to person, place, and time. She appears well-developed. She is active and cooperative.  Non-toxic  appearance. She does not have a sickly appearance. She does not appear ill. No distress. She is not intubated. Nasal cannula in place.  HENT:  Head: Normocephalic and atraumatic.  Mouth/Throat: Oropharynx is clear and moist. No oropharyngeal exudate or posterior oropharyngeal edema.  Eyes: Conjunctivae, EOM and lids are normal. No scleral icterus.  Neck: Neck supple. No JVD present. No thyromegaly present.  Cardiovascular: Normal rate, regular rhythm, S1 normal, S2 normal and normal heart sounds.  No extrasystoles are present. Exam reveals no gallop, no S3, no S4, no distant heart sounds and no friction rub.  No murmur heard. Pulmonary/Chest: No accessory muscle usage or stridor. Tachypnea noted. No apnea and no bradypnea. She is not intubated. No respiratory distress. She has decreased breath sounds in the right upper field, the right middle field, the right lower field, the left upper field, the left middle field and the left lower field. She has wheezes in the right upper field, the right middle field, the right lower field, the left upper field, the left middle field and the left lower field. She has no rhonchi. She has no rales.  Abdominal: Soft. Bowel sounds are normal. She exhibits no distension. There is no tenderness. There is no rigidity, no rebound and no guarding.  Musculoskeletal: Normal range of motion. She exhibits no edema or tenderness.       Right lower leg: Normal. She exhibits no tenderness and no edema.       Left lower leg: Normal. She exhibits no tenderness and no edema.  Lymphadenopathy:    She has no cervical adenopathy.  Neurological: She is alert and oriented to person, place, and time. She is not disoriented.  Skin: Skin is warm, dry and intact. No rash noted. She is not diaphoretic. No erythema.  Psychiatric: She has a normal mood and affect. Her speech is normal and behavior is normal. Judgment and thought content normal. Her mood appears not anxious. She is not  agitated. Cognition and memory are normal.   LABORATORY PANEL:   CBC Recent Labs  Lab 09/13/18 0120  WBC 13.1*  HGB 11.3*  HCT 36.5  PLT 224   ------------------------------------------------------------------------------------------------------------------  Chemistries  Recent Labs  Lab 09/13/18 0120  NA 139  K 3.8  CL 101  CO2 30  GLUCOSE 108*  BUN 13  CREATININE 0.67  CALCIUM 8.6*  MG 1.9  AST 19  ALT 13  ALKPHOS 59  BILITOT 0.4   ------------------------------------------------------------------------------------------------------------------  Cardiac Enzymes Recent Labs  Lab 09/13/18 0120  TROPONINI <0.03   ------------------------------------------------------------------------------------------------------------------  RADIOLOGY:  Dg Chest Port 1 View  Result Date: 09/13/2018 CLINICAL DATA:  Shortness of breath EXAM: PORTABLE CHEST 1 VIEW COMPARISON:  Chest radiograph and chest CT August 08, 2018 FINDINGS: There is scarring with volume loss/asymmetric pleural thickening in the right apex. A lesser degree of scarring is noted in the left apex. There is also scarring in the right base. There is no frank edema or consolidation. The heart size is normal. The pulmonary vascularity is stable with a degree of distortion due to scarring. No adenopathy evident. There are surgical clips in the right axilla with evidence of prior mastectomy on the right. There is aortic atherosclerosis. No bone lesions. IMPRESSION: Areas of scarring in the upper lobes, more on the right than on the left. There is asymmetric apical pleural thickening on the right. No edema or consolidation. Stable cardiac silhouette. There is aortic atherosclerosis. Aortic Atherosclerosis (ICD10-I70.0). Electronically Signed   By: Lowella Grip III M.D.   On: 09/13/2018 02:18   IMPRESSION AND PLAN:   A/P: 15F p/w mild acute COPD exacerbation, acute on chronic hypoxemic respiratory failure.  Hyperglycemia, hypocalcemia, hypoproteinemia, leukocytosis, normocytic anemia, low back pain. -Acute COPD exacerbation, acute on chronic hypoxemic respiratory failure: SOB/wheezing as per HPI. Continued wheezing on exam, albeit mild. (-) conversational dyspnea, tripoding, retractions, accessory muscle use. (-) BiPAP, (-) change in sputum quantity/quality or character. Mild exacerbation. Nebs, steroids (IV, transition to PO), incentive spirometry, pulmonary toileting, O2. ABx not indicated (mild exacerbation). c/w LABA/ICS. -Hypocalcemia: Ionized calcium. -Hypoproteinemia: Prealbumin. -Leukocytosis, hyperglycemia: Likely 2/2 steroids. -Normocytic anemia: Likely anemia of chronic disease, no evidence of acute blood loss at present time. -Low back pain: Lidoderm patch. -c/w other home meds. -FEN/GI: Cardiac diet. -DVT PPx: Lovenox. -Code status: Full code. -Disposition: Admission, > 2 midnights.   All the records are reviewed and case discussed with ED provider. Management plans discussed with the patient, family and they are in agreement.  CODE STATUS: Full code.  TOTAL TIME TAKING CARE OF THIS PATIENT: 75 minutes.    Arta Silence M.D on 09/13/2018 at 9:41 AM  Between 7am to 6pm - Pager - [phone]  After 6pm go to [email or web address] - password [email or web address] [facility]  [facility] [place] [facility]  Office  [phone]  CC: Primary care physician; Adin Hector, MD   Note: This dictation was prepared with Dragon dictation along with smaller phrase technology. Any transcriptional errors that result from this process are unintentional.

## 2018-09-13 NOTE — Progress Notes (Signed)
Family Meeting Note  Advance Directive:yes  Today a meeting took place with the Patient.  Admitted this morning for COPD exacerbation, on chronic oxygen 2 L all the time.  She was cleaning the dog when this morning with oxygen on and started to have labs from the open because of the oxygen.  Patient's breathing got worse since then.  She told me that she saw her son died recently of cancer being on the vent and she decided that time, she does not want to be a full code wants to be DNR and does not want CPR or intubation.  The following clinical team members were present during this meeting:MD  The following were discussed:Patient's diagnosis: , Patient's progosis:  Additional follow-up to be provided:   Time spent during discussion: 39 minutes  Epifanio Lesches, MD

## 2018-09-13 NOTE — Plan of Care (Signed)
  Problem: Clinical Measurements: Goal: Respiratory complications will improve Outcome: Progressing   Problem: Nutrition: Goal: Adequate nutrition will be maintained Outcome: Progressing   Problem: Education: Goal: Knowledge of disease or condition will improve Outcome: Progressing

## 2018-09-13 NOTE — Plan of Care (Signed)
  Problem: Health Behavior/Discharge Planning: Goal: Ability to manage health-related needs will improve Outcome: Progressing   Problem: Safety: Goal: Ability to remain free from injury will improve Outcome: Progressing   

## 2018-09-13 NOTE — Progress Notes (Signed)
svn given. Patient tolerated well. Has requested not to be awakened if sleeping however for 2 am tx

## 2018-09-14 DIAGNOSIS — J9601 Acute respiratory failure with hypoxia: Secondary | ICD-10-CM | POA: Diagnosis present

## 2018-09-14 DIAGNOSIS — J441 Chronic obstructive pulmonary disease with (acute) exacerbation: Secondary | ICD-10-CM | POA: Diagnosis not present

## 2018-09-14 DIAGNOSIS — R739 Hyperglycemia, unspecified: Secondary | ICD-10-CM | POA: Diagnosis not present

## 2018-09-14 MED ORDER — AZITHROMYCIN 250 MG PO TABS: ORAL_TABLET | ORAL | 0 refills | Status: AC

## 2018-09-14 MED ORDER — PREDNISONE 10 MG (21) PO TBPK: ORAL_TABLET | ORAL | 0 refills | Status: DC

## 2018-09-14 NOTE — Discharge Summary (Signed)
Robin Hudson, is a 77 y.o. female  DOB [birth year]  MRN [id number].  Admission date:  09/13/2018  Admitting Physician  Arta Silence, MD  Discharge Date:  09/14/2018   Primary MD  Adin Hector, MD  Recommendations for primary care physician for things to follow:   Follow-up with PCP in 1 week Follow-up with Dr. Raul Del as scheduled   Admission Diagnosis  Shortness of breath [R06.02] COPD exacerbation ([place]) [J44.1]   Discharge Diagnosis  Shortness of breath [R06.02] COPD exacerbation ([place]) [J44.1]    Active Problems:   Acute hypoxemic respiratory failure ([place])      Past Medical History:  Diagnosis Date  . Allergic rhinitis   . Anxiety   . Asthma   . B12 deficiency   . Breast cancer ([place]) 1999   RT LUMPECTOMY  . Carcinoma of right breast treated with adjuvant chemotherapy ([place]) 1999   RT LUMPECTOMY  . CHF (congestive heart failure) ([place])   . Colon cancer ([place]) 2010  . Colon cancer ([place])   . COPD (chronic obstructive pulmonary disease) ([place])   . Hyperlipemia   . Hypertension   . MRSA pneumonia ([place])   . Osteoporosis   . Radiation 1999   BREAST CA  . Right adrenal mass [facility])     Past Surgical History:  Procedure Laterality Date  . ABDOMINAL HYSTERECTOMY    . APPENDECTOMY    . BREAST SURGERY Right    lumpectomy x2 with lymph nodes  . CARPAL TUNNEL RELEASE Left 06/04/2016   Procedure: CARPAL TUNNEL RELEASE;  Surgeon: Hessie Knows, MD;  Location: [facility] ORS;  Service: Orthopedics;  Laterality: Left;  . colectomy     partial  . IMAGE GUIDED SINUS SURGERY    . OPEN REDUCTION INTERNAL FIXATION ([facility]) DISTAL RADIAL FRACTURE Left 06/04/2016   Procedure: OPEN REDUCTION INTERNAL FIXATION (ORIF) DISTAL RADIAL FRACTURE;  Surgeon: Hessie Knows, MD;  Location: [facility] ORS;  Service: Orthopedics;  Laterality:  Left;       History of present illness and  Hospital Course:     Kindly see H&P for history of present illness and admission details, please review complete Labs, Consult reports and Test reports for all details in brief  HPI  from the history and physical done on the day of admission 77 year old female with history of,, chronic respiratory failure on 2 L of oxygen at night and also as needed comes in because of shortness of breath, wheezing.  Has history of COPD, B12 deficiency, history of breast cancer, colon cancer.  Hospital Course   #1 acute COPD exacerbation: Received IV steroids, bronchodilators, continue oxygen, patient did have a lot of wheezing yesterday but minimal wheezing today.  He is up to go home.  Continue her bronchodilators at home, discharged home with prednisone Dosepak, azithromycin, patient is on oxygen 2 L advised her to continue oxygen nightly and as needed, follows up with Dr. Raul Del as an outpatient. 2.  Chronic low back pain, patient follows up with Dr. Dr. Sharlet Salina. #3. the patient: Continue B12 supplements. 4.  Degenerative disc issues, lumbosacral pain: 5.  History of depression 6.  Patient found to have new lung nodules in the right lung, history of lung cancer in the left lung.  Patient is to follow-up with pulmonary as an outpatient for PET scan. 7.  Malnutrition severe malnutrition, patient told me that she is losing weight even though she is eating well.  Continue Ensure, will ask dietitian to see her before she  goes. #8 anxiety: Continue Xanax. 9.  Essential hypertension: Controlled, continue beta-blockers.    Discharge Condition: Stable   Follow UP  Follow-up Information    Tama High III, MD. Schedule an appointment as soon as possible for a visit in 1 week(s).   Specialty:  Internal Medicine Contact information: [place] [address] [phone]             Discharge Instructions  and  Discharge Medications       Allergies as of 09/14/2018      Reactions   Ace Inhibitors    Other reaction(s): Cough   Alendronate    Other reaction(s): Other (See Comments) GI UPSET   Coreg [carvedilol] Other (See Comments)   syncope   Duloxetine Other (See Comments)   Morphine Other (See Comments)   confusion   Pregabalin    Other reaction(s): Other (See Comments) intolerant   Statins Other (See Comments)   Verapamil    Other reaction(s): Other (See Comments) CONSTIPATION      Medication List    TAKE these medications   ADVAIR DISKUS 250-50 MCG/DOSE Aepb Generic drug:  Fluticasone-Salmeterol INHALE 1 PUFF INTO THE LUNGS EVERY 12 HOURS   albuterol (2.5 MG/3ML) 0.083% nebulizer solution Commonly known as:  PROVENTIL Take 2.5 mg by nebulization every 6 (six) hours as needed for wheezing or shortness of breath.   PROAIR HFA 108 (90 Base) MCG/ACT inhaler Generic drug:  albuterol INHALE 2 PUFFS BY MOUTH INTO THE LUNGS EVERY 6 HOURS AS NEEDED FOR WHEEZING   ALPRAZolam 0.5 MG tablet Commonly known as:  XANAX Take 1 tablet by mouth 2 (two) times daily as needed for anxiety.   aspirin EC 81 MG tablet Take 1 tablet (81 mg total) by mouth daily.   atenolol 50 MG tablet Commonly known as:  TENORMIN Take 1 tablet (50 mg total) by mouth daily. What changed:  how much to take   azelastine 0.1 % nasal spray Commonly known as:  ASTELIN Place 1 spray into both nostrils 2 (two) times daily. Use in each nostril as directed   azithromycin 250 MG tablet Commonly known as:  ZITHROMAX Take 2 tablets (500 mg) on  Day 1,  followed by 1 tablet (250 mg) once daily on Days 2 through 5.   butalbital-acetaminophen-caffeine 50-325-40 MG tablet Commonly known as:  FIORICET, ESGIC Take 1 tablet by mouth every 6 (six) hours as needed.   cholecalciferol 1000 units tablet Commonly known as:  VITAMIN D Take 1,000 Units by mouth daily.   cyanocobalamin 1000 MCG/ML injection Commonly known as:  (VITAMIN  B-12) Inject 1,000 mcg into the muscle every 30 (thirty) days.   HYDROcodone-acetaminophen 7.5-325 MG tablet Commonly known as:  NORCO Take 1 tablet by mouth 3 (three) times daily as needed for moderate pain.   hydroxychloroquine 200 MG tablet Commonly known as:  PLAQUENIL Take 400 mg by mouth daily.   losartan 50 MG tablet Commonly known as:  COZAAR Take 1 tablet (50 mg total) by mouth daily.   pantoprazole 20 MG tablet Commonly known as:  PROTONIX Take 1 tablet (20 mg total) 2 (two) times daily by mouth.   predniSONE 10 MG tablet Commonly known as:  DELTASONE Take 1 tablet (10 mg total) by mouth daily. Label  & dispense according to the schedule below.  6 tablets day one, then 5 table day 2, then 4 tablets day 3, then 3 tablets day 4, 2 tablets day 5, then 1 tablet  day 6, then stop What changed:  Another medication with the same name was added. Make sure you understand how and when to take each.   predniSONE 10 MG (21) Tbpk tablet Commonly known as:  STERAPRED UNI-PAK 21 TAB Taper by 10 mg daily What changed:  You were already taking a medication with the same name, and this prescription was added. Make sure you understand how and when to take each.         Diet and Activity recommendation: See Discharge Instructions above   Consults obtained -stable   Major procedures and Radiology Reports - PLEASE review detailed and final reports for all details, in brief -      Dg Chest Port 1 View  Result Date: 09/13/2018 CLINICAL DATA:  Shortness of breath EXAM: PORTABLE CHEST 1 VIEW COMPARISON:  Chest radiograph and chest CT August 08, 2018 FINDINGS: There is scarring with volume loss/asymmetric pleural thickening in the right apex. A lesser degree of scarring is noted in the left apex. There is also scarring in the right base. There is no frank edema or consolidation. The heart size is normal. The pulmonary vascularity is stable with a degree of distortion due to scarring.  No adenopathy evident. There are surgical clips in the right axilla with evidence of prior mastectomy on the right. There is aortic atherosclerosis. No bone lesions. IMPRESSION: Areas of scarring in the upper lobes, more on the right than on the left. There is asymmetric apical pleural thickening on the right. No edema or consolidation. Stable cardiac silhouette. There is aortic atherosclerosis. Aortic Atherosclerosis (ICD10-I70.0). Electronically Signed   By: Lowella Grip III M.D.   On: 09/13/2018 02:18    Micro Results     Recent Results (from the past 240 hour(s))  MRSA PCR Screening     Status: None   Collection Time: 09/13/18  6:22 AM  Result Value Ref Range Status   MRSA by PCR NEGATIVE NEGATIVE Final    Comment:        The GeneXpert MRSA Assay (FDA approved for NASAL specimens only), is one component of a comprehensive MRSA colonization surveillance program. It is not intended to diagnose MRSA infection nor to guide or monitor treatment for MRSA infections. Performed at [facility], [address] [place], [place] [address]        Today   Subjective:   Robin Hudson today has no headache,no chest abdominal pain,no new weakness tingling or numbness, feels much better wants to go home today.  Objective:   Blood pressure (!) 114/52, pulse 74, temperature 97.7 F (36.5 C), temperature source Oral, resp. rate 19, height 5\' 4"  (1.626 m), weight 42.7 kg, SpO2 100 %.   Intake/Output Summary (Last 24 hours) at 09/14/2018 0842 Last data filed at 09/14/2018 0736 Gross per 24 hour  Intake 480 ml  Output 500 ml  Net -20 ml    Exam Awake Alert, Oriented x 3, No new F.N deficits, Normal affect [place].AT,PERRAL Supple Neck,No JVD, No cervical lymphadenopathy appriciated.  Symmetrical Chest wall movement, Good air movement bilaterally, CTAB RRR,No Gallops,Rubs or new Murmurs, No Parasternal Heave +ve B.Sounds, Abd Soft, Non tender, No organomegaly appriciated,  No rebound -guarding or rigidity. No Cyanosis, Clubbing or edema, No new Rash or bruise  Data Review   CBC w Diff:  Lab Results  Component Value Date   WBC 13.1 (H) 09/13/2018   HGB 11.3 (L) 09/13/2018   HGB 11.1 (L) 03/10/2015   HCT 36.5 09/13/2018  HCT 35.1 03/10/2015   PLT 224 09/13/2018   PLT 397 03/10/2015   LYMPHOPCT 11 09/13/2018   LYMPHOPCT 8.3 03/10/2015   MONOPCT 10 09/13/2018   MONOPCT 4.6 03/10/2015   EOSPCT 1 09/13/2018   EOSPCT 0.1 03/10/2015   BASOPCT 1 09/13/2018   BASOPCT 0.3 03/10/2015    CMP:  Lab Results  Component Value Date   NA 139 09/13/2018   NA 135 03/11/2015   K 3.8 09/13/2018   K 3.6 03/11/2015   CL 101 09/13/2018   CL 94 (L) 03/11/2015   CO2 30 09/13/2018   CO2 33 (H) 03/11/2015   BUN 13 09/13/2018   BUN 19 03/11/2015   CREATININE 0.67 09/13/2018   CREATININE 0.64 03/11/2015   PROT 6.4 (L) 09/13/2018   PROT 5.9 (L) 03/06/2015   ALBUMIN 3.7 09/13/2018   ALBUMIN 2.2 (L) 03/06/2015   BILITOT 0.4 09/13/2018   BILITOT 0.9 03/06/2015   ALKPHOS 59 09/13/2018   ALKPHOS 75 03/06/2015   AST 19 09/13/2018   AST 15 03/06/2015   ALT 13 09/13/2018   ALT 11 (L) 03/06/2015  .   Total Time in preparing paper work, data evaluation and todays exam - 35 minutes  Epifanio Lesches M.D on 09/14/2018 at 8:42 AM    Note: This dictation was prepared with Dragon dictation along with smaller phrase technology. Any transcriptional errors that result from this process are unintentional.

## 2018-09-14 NOTE — Care Management Note (Signed)
Case Management Note  Patient Details  Name: Robin Hudson MRN: [id number] Date of Birth: [birth year]  Subjective/Objective:    Patient from home; lives with husband.  Admitted with acute hypoxemic respiratory failure.  Discharging today.  Received IV steroids.  Chronic 2l O2.  Obtains oxygen from [place].  Has a walker and wheelchair at home if needed.  Denies difficulty obtaining medications or with transportation.  Husband will transport home today.  Current with Dr. Caryl Comes.  Declines need for home health services at this time.  Asked patient to have husband bring portable O2 for discharge.               Action/Plan: No needs identified at this time by care management.    Expected Discharge Date:  09/14/18               Expected Discharge Plan:  Home/Self Care  In-House Referral:     Discharge planning Services  CM Consult  Post Acute Care Choice:    Choice offered to:     DME Arranged:    DME Agency:     HH Arranged:    HH Agency:     Status of Service:  Completed, signed off  If discussed at [employer] of Stay Meetings, dates discussed:    Additional Comments:  Robin Rafter, RN 09/14/2018, 9:33 AM

## 2018-09-14 NOTE — Care Management Obs Status (Signed)
MEDICARE OBSERVATION STATUS NOTIFICATION   Patient Details  Name: Robin Hudson MRN: [id number] Date of Birth: [birth year]   Medicare Observation Status Notification Given:  Yes    [name], RN 09/14/2018, 9:57 AM

## 2018-09-14 NOTE — Progress Notes (Signed)
Patient discharged home today with husband, she has her personal oxygen tank at bedside and will use that, IV has been removed, patient educated about follow up appointments and medication regimen upon return home. She will pick her medications up at her pharmacy, [facility], [place]. Patient requested the inhaler she had used here in the hospital however it was not present in her bin this morning, and never sent a refill by pharmacy. Informed patient that she would be charged for another one unfortunately if she wished to take it home, and apologized for the inconvenience. No other concerns noted.

## 2018-09-14 NOTE — Plan of Care (Signed)

## 2018-09-14 NOTE — Care Management CC44 (Signed)
Condition Code 44 Documentation Completed  Patient Details  Name: MIIA BLANKS MRN: [id number] Date of Birth: 1941-07-06   Condition Code 44 given:  Yes Patient signature on Condition Code 44 notice:  Yes Documentation of 2 MD's agreement:  Yes Code 44 added to claim:  Yes    [name], RN 09/14/2018, 9:58 AM

## 2018-09-15 LAB — CALCIUM, IONIZED: CALCIUM, IONIZED, SERUM: 4.8 mg/dL (ref 4.5–5.6)

## 2018-09-18 ENCOUNTER — Telehealth: Payer: Self-pay

## 2018-09-18 DIAGNOSIS — C3432 Malignant neoplasm of lower lobe, left bronchus or lung: Secondary | ICD-10-CM | POA: Diagnosis not present

## 2018-09-18 DIAGNOSIS — J439 Emphysema, unspecified: Secondary | ICD-10-CM | POA: Diagnosis not present

## 2018-09-18 DIAGNOSIS — E538 Deficiency of other specified B group vitamins: Secondary | ICD-10-CM | POA: Diagnosis not present

## 2018-09-18 DIAGNOSIS — R918 Other nonspecific abnormal finding of lung field: Secondary | ICD-10-CM | POA: Diagnosis not present

## 2018-09-18 NOTE — Telephone Encounter (Signed)
Flagged on EMMI report for having other questions or problems.  Called and spoke with patient, however she was unable to talk due to needing to leave to go to a doctor's appointment.  Will attempt once more on 09/21/18.

## 2018-09-23 ENCOUNTER — Telehealth: Payer: Self-pay

## 2018-09-23 ENCOUNTER — Inpatient Hospital Stay
Admission: EM | Admit: 2018-09-23 | Discharge: 2018-09-26 | DRG: 190 | Disposition: A | Discharge status: Home / Self Care | Payer: PPO | Attending: Internal Medicine | Admitting: Internal Medicine

## 2018-09-23 ENCOUNTER — Other Ambulatory Visit: Payer: Self-pay

## 2018-09-23 ENCOUNTER — Emergency Department: Payer: PPO

## 2018-09-23 DIAGNOSIS — Z8249 Family history of ischemic heart disease and other diseases of the circulatory system: Secondary | ICD-10-CM

## 2018-09-23 DIAGNOSIS — Z9221 Personal history of antineoplastic chemotherapy: Secondary | ICD-10-CM

## 2018-09-23 DIAGNOSIS — Z90710 Acquired absence of both cervix and uterus: Secondary | ICD-10-CM

## 2018-09-23 DIAGNOSIS — Z681 Body mass index (BMI) 19 or less, adult: Secondary | ICD-10-CM

## 2018-09-23 DIAGNOSIS — Z803 Family history of malignant neoplasm of breast: Secondary | ICD-10-CM | POA: Diagnosis not present

## 2018-09-23 DIAGNOSIS — I110 Hypertensive heart disease with heart failure: Secondary | ICD-10-CM | POA: Diagnosis not present

## 2018-09-23 DIAGNOSIS — Z7951 Long term (current) use of inhaled steroids: Secondary | ICD-10-CM

## 2018-09-23 DIAGNOSIS — Z66 Do not resuscitate: Secondary | ICD-10-CM | POA: Diagnosis present

## 2018-09-23 DIAGNOSIS — M069 Rheumatoid arthritis, unspecified: Secondary | ICD-10-CM | POA: Diagnosis present

## 2018-09-23 DIAGNOSIS — T380X5A Adverse effect of glucocorticoids and synthetic analogues, initial encounter: Secondary | ICD-10-CM | POA: Diagnosis present

## 2018-09-23 DIAGNOSIS — E43 Unspecified severe protein-calorie malnutrition: Secondary | ICD-10-CM | POA: Diagnosis not present

## 2018-09-23 DIAGNOSIS — Z888 Allergy status to other drugs, medicaments and biological substances status: Secondary | ICD-10-CM

## 2018-09-23 DIAGNOSIS — I700 Atherosclerosis of aorta: Secondary | ICD-10-CM | POA: Diagnosis not present

## 2018-09-23 DIAGNOSIS — J441 Chronic obstructive pulmonary disease with (acute) exacerbation: Secondary | ICD-10-CM | POA: Diagnosis not present

## 2018-09-23 DIAGNOSIS — Z7982 Long term (current) use of aspirin: Secondary | ICD-10-CM

## 2018-09-23 DIAGNOSIS — Z885 Allergy status to narcotic agent status: Secondary | ICD-10-CM

## 2018-09-23 DIAGNOSIS — D72829 Elevated white blood cell count, unspecified: Secondary | ICD-10-CM | POA: Diagnosis present

## 2018-09-23 DIAGNOSIS — F17210 Nicotine dependence, cigarettes, uncomplicated: Secondary | ICD-10-CM | POA: Diagnosis not present

## 2018-09-23 DIAGNOSIS — E785 Hyperlipidemia, unspecified: Secondary | ICD-10-CM | POA: Diagnosis not present

## 2018-09-23 DIAGNOSIS — J9621 Acute and chronic respiratory failure with hypoxia: Secondary | ICD-10-CM | POA: Diagnosis not present

## 2018-09-23 DIAGNOSIS — Z9981 Dependence on supplemental oxygen: Secondary | ICD-10-CM | POA: Diagnosis not present

## 2018-09-23 DIAGNOSIS — Z7952 Long term (current) use of systemic steroids: Secondary | ICD-10-CM

## 2018-09-23 DIAGNOSIS — G8929 Other chronic pain: Secondary | ICD-10-CM | POA: Diagnosis present

## 2018-09-23 DIAGNOSIS — F419 Anxiety disorder, unspecified: Secondary | ICD-10-CM | POA: Diagnosis not present

## 2018-09-23 DIAGNOSIS — I509 Heart failure, unspecified: Secondary | ICD-10-CM | POA: Diagnosis not present

## 2018-09-23 DIAGNOSIS — Z9010 Acquired absence of unspecified breast and nipple: Secondary | ICD-10-CM

## 2018-09-23 DIAGNOSIS — Z79891 Long term (current) use of opiate analgesic: Secondary | ICD-10-CM

## 2018-09-23 DIAGNOSIS — R0602 Shortness of breath: Secondary | ICD-10-CM | POA: Diagnosis not present

## 2018-09-23 DIAGNOSIS — M810 Age-related osteoporosis without current pathological fracture: Secondary | ICD-10-CM | POA: Diagnosis present

## 2018-09-23 DIAGNOSIS — M545 Low back pain: Secondary | ICD-10-CM | POA: Diagnosis not present

## 2018-09-23 DIAGNOSIS — R000 Tachycardia, unspecified: Secondary | ICD-10-CM | POA: Diagnosis not present

## 2018-09-23 DIAGNOSIS — Z853 Personal history of malignant neoplasm of breast: Secondary | ICD-10-CM

## 2018-09-23 DIAGNOSIS — Z923 Personal history of irradiation: Secondary | ICD-10-CM

## 2018-09-23 DIAGNOSIS — Z85038 Personal history of other malignant neoplasm of large intestine: Secondary | ICD-10-CM

## 2018-09-23 DIAGNOSIS — E538 Deficiency of other specified B group vitamins: Secondary | ICD-10-CM | POA: Diagnosis present

## 2018-09-23 DIAGNOSIS — R05 Cough: Secondary | ICD-10-CM | POA: Diagnosis not present

## 2018-09-23 DIAGNOSIS — K219 Gastro-esophageal reflux disease without esophagitis: Secondary | ICD-10-CM | POA: Diagnosis present

## 2018-09-23 DIAGNOSIS — I10 Essential (primary) hypertension: Secondary | ICD-10-CM | POA: Diagnosis not present

## 2018-09-23 DIAGNOSIS — Z882 Allergy status to sulfonamides status: Secondary | ICD-10-CM

## 2018-09-23 LAB — CBC WITH DIFFERENTIAL/PLATELET
ABS IMMATURE GRANULOCYTES: 0.23 10*3/uL — AB (ref 0.00–0.07)
BASOS PCT: 0 %
Basophils Absolute: 0.1 10*3/uL (ref 0.0–0.1)
Eosinophils Absolute: 0.1 10*3/uL (ref 0.0–0.5)
Eosinophils Relative: 0 %
HCT: 43.5 % (ref 36.0–46.0)
Hemoglobin: 13.5 g/dL (ref 12.0–15.0)
Immature Granulocytes: 1 %
Lymphocytes Relative: 4 %
Lymphs Abs: 0.8 10*3/uL (ref 0.7–4.0)
MCH: 28.1 pg (ref 26.0–34.0)
MCHC: 31 g/dL (ref 30.0–36.0)
MCV: 90.6 fL (ref 80.0–100.0)
Monocytes Absolute: 0.8 10*3/uL (ref 0.1–1.0)
Monocytes Relative: 4 %
NEUTROS ABS: 17.3 10*3/uL — AB (ref 1.7–7.7)
NEUTROS PCT: 91 %
PLATELETS: 393 10*3/uL (ref 150–400)
RBC: 4.8 MIL/uL (ref 3.87–5.11)
RDW: 13.4 % (ref 11.5–15.5)
WBC: 19.3 10*3/uL — AB (ref 4.0–10.5)
nRBC: 0 % (ref 0.0–0.2)

## 2018-09-23 LAB — BLOOD GAS, ARTERIAL
ACID-BASE EXCESS: 12.6 mmol/L — AB (ref 0.0–2.0)
BICARBONATE: 39.2 mmol/L — AB (ref 20.0–28.0)
FIO2: 0.28
O2 SAT: 91.3 %
PO2 ART: 60 mmHg — AB (ref 83.0–108.0)
Patient temperature: 37
pCO2 arterial: 59 mmHg — ABNORMAL HIGH (ref 32.0–48.0)
pH, Arterial: 7.43 (ref 7.350–7.450)

## 2018-09-23 LAB — LACTIC ACID, PLASMA: Lactic Acid, Venous: 1 mmol/L (ref 0.5–1.9)

## 2018-09-23 LAB — BASIC METABOLIC PANEL
Anion gap: 11 (ref 5–15)
BUN: 11 mg/dL (ref 8–23)
CHLORIDE: 90 mmol/L — AB (ref 98–111)
CO2: 39 mmol/L — AB (ref 22–32)
Calcium: 9.4 mg/dL (ref 8.9–10.3)
Creatinine, Ser: 0.55 mg/dL (ref 0.44–1.00)
GFR calc Af Amer: >60 mL/min (ref >60)
GLUCOSE: 88 mg/dL (ref 70–99)
POTASSIUM: 4 mmol/L (ref 3.5–5.1)
Sodium: 140 mmol/L (ref 135–145)

## 2018-09-23 MED ORDER — SODIUM CHLORIDE 0.9 % IV BOLUS
500.0000 mL | Freq: Once | INTRAVENOUS | Status: AC
  Administered 2018-09-23: 500 mL via INTRAVENOUS

## 2018-09-23 MED ORDER — LEVOFLOXACIN IN D5W 500 MG/100ML IV SOLN
500.0000 mg | Freq: Once | INTRAVENOUS | Status: AC
  Administered 2018-09-24: 500 mg via INTRAVENOUS
  Filled 2018-09-23: qty 100

## 2018-09-23 MED ORDER — IPRATROPIUM-ALBUTEROL 0.5-2.5 (3) MG/3ML IN SOLN
RESPIRATORY_TRACT | Status: AC
  Filled 2018-09-23: qty 3

## 2018-09-23 MED ORDER — SODIUM CHLORIDE 0.9 % IV SOLN
500.0000 mg | Freq: Once | INTRAVENOUS | Status: AC
  Administered 2018-09-23: 500 mg via INTRAVENOUS
  Filled 2018-09-23: qty 500

## 2018-09-23 MED ORDER — IPRATROPIUM-ALBUTEROL 0.5-2.5 (3) MG/3ML IN SOLN
3.0000 mL | Freq: Once | RESPIRATORY_TRACT | Status: AC
  Administered 2018-09-23: 3 mL via RESPIRATORY_TRACT

## 2018-09-23 MED ORDER — ALBUTEROL SULFATE (2.5 MG/3ML) 0.083% IN NEBU
INHALATION_SOLUTION | RESPIRATORY_TRACT | Status: AC
  Filled 2018-09-23: qty 3

## 2018-09-23 MED ORDER — HYDROCODONE-ACETAMINOPHEN 5-325 MG PO TABS
ORAL_TABLET | ORAL | Status: AC
  Administered 2018-09-23: 1
  Filled 2018-09-23: qty 1

## 2018-09-23 MED ORDER — ALPRAZOLAM 0.5 MG PO TABS
0.5000 mg | ORAL_TABLET | Freq: Once | ORAL | Status: AC
  Administered 2018-09-23: 0.5 mg via ORAL
  Filled 2018-09-23: qty 1

## 2018-09-23 MED ORDER — SODIUM CHLORIDE 0.9 % IV SOLN
1.0000 g | Freq: Once | INTRAVENOUS | Status: AC
  Administered 2018-09-23: 1 g via INTRAVENOUS
  Filled 2018-09-23: qty 10

## 2018-09-23 MED ORDER — METHYLPREDNISOLONE SODIUM SUCC 125 MG IJ SOLR
80.0000 mg | Freq: Once | INTRAMUSCULAR | Status: AC
  Administered 2018-09-23: 80 mg via INTRAVENOUS
  Filled 2018-09-23: qty 2

## 2018-09-23 MED ORDER — MAGNESIUM SULFATE IN D5W 1-5 GM/100ML-% IV SOLN
1.0000 g | INTRAVENOUS | Status: AC
  Administered 2018-09-23: 1 g via INTRAVENOUS
  Filled 2018-09-23: qty 100

## 2018-09-23 MED ORDER — OXYCODONE-ACETAMINOPHEN 5-325 MG PO TABS
1.0000 | ORAL_TABLET | Freq: Once | ORAL | Status: AC
  Administered 2018-09-24: 1 via ORAL
  Filled 2018-09-23 (×2): qty 1

## 2018-09-23 MED ORDER — LEVOFLOXACIN IN D5W 250 MG/50ML IV SOLN
250.0000 mg | INTRAVENOUS | Status: DC
  Filled 2018-09-23: qty 50

## 2018-09-23 MED ORDER — ALBUTEROL SULFATE (2.5 MG/3ML) 0.083% IN NEBU
5.0000 mg | INHALATION_SOLUTION | Freq: Once | RESPIRATORY_TRACT | Status: AC
  Administered 2018-09-23: 5 mg via RESPIRATORY_TRACT

## 2018-09-23 NOTE — ED Provider Notes (Signed)
[facility] Emergency Department Provider Note  ____________________________________________  Time seen: Approximately 8:01 PM  I have reviewed the triage vital signs and the nursing notes.   HISTORY  Chief Complaint Shortness of Breath    HPI Robin Hudson is a 77 y.o. female with a history of breast cancer colon cancer hypertension and COPD on 2 L nasal cannula at night who complains of worsening shortness of breath and productive cough over the past 2 to 3 days.  Denies chest pain.  Feels like COPD.  Reports she recently completed a course of prednisone and azithromycin without improvement.  Symptoms are constant, waxing and waning, worse with exertion.  No pleuritic pain.  Denies a history of PE.  No fever but positive chills.      Past Medical History:  Diagnosis Date  . Allergic rhinitis   . Anxiety   . Asthma   . B12 deficiency   . Breast cancer ([place]) 1999   RT LUMPECTOMY  . Carcinoma of right breast treated with adjuvant chemotherapy ([place]) 1999   RT LUMPECTOMY  . CHF (congestive heart failure) ([place])   . Colon cancer ([place]) 2010  . Colon cancer ([place])   . COPD (chronic obstructive pulmonary disease) ([place])   . Hyperlipemia   . Hypertension   . MRSA pneumonia ([place])   . Osteoporosis   . Radiation 1999   BREAST CA  . Right adrenal mass [facility])      Patient Active Problem List   Diagnosis Date Noted  . Acute respiratory failure with hypoxia ([place]) 09/14/2018  . Acute hypoxemic respiratory failure ([place]) 09/13/2018  . Acute exacerbation of chronic obstructive pulmonary disease (COPD) ([place]) 08/08/2018  . Leukocytosis 12/29/2016  . Cough 12/29/2016  . Hypoxia 12/06/2015  . COPD exacerbation ([place]) 12/03/2015  . Elevated troponin 12/03/2015  . Elevated antinuclear antibody (ANA) level 05/15/2015  . Shortness of breath 05/12/2015  . Hyperlipemia 05/12/2015  . COPD (chronic obstructive pulmonary disease) ([place]) 05/12/2015  . HTN (hypertension)  05/12/2015  . Osteoporosis 05/12/2015  . Right adrenal mass ([place]) 05/12/2015  . B12 deficiency 05/12/2015  . Allergic rhinitis 05/12/2015  . Breast cancer ([place]) 05/12/2015  . Colon cancer ([place]) 05/12/2015  . Closed head injury 12/15/2014     Past Surgical History:  Procedure Laterality Date  . ABDOMINAL HYSTERECTOMY    . APPENDECTOMY    . BREAST SURGERY Right    lumpectomy x2 with lymph nodes  . CARPAL TUNNEL RELEASE Left 06/04/2016   Procedure: CARPAL TUNNEL RELEASE;  Surgeon: Hessie Knows, MD;  Location: [facility] ORS;  Service: Orthopedics;  Laterality: Left;  . colectomy     partial  . IMAGE GUIDED SINUS SURGERY    . OPEN REDUCTION INTERNAL FIXATION (ORIF) DISTAL RADIAL FRACTURE Left 06/04/2016   Procedure: OPEN REDUCTION INTERNAL FIXATION (ORIF) DISTAL RADIAL FRACTURE;  Surgeon: Hessie Knows, MD;  Location: [facility] ORS;  Service: Orthopedics;  Laterality: Left;     Prior to Admission medications   Medication Sig Start Date End Date Taking? Authorizing Provider  albuterol (PROAIR HFA) 108 (90 Base) MCG/ACT inhaler INHALE 2 PUFFS BY MOUTH INTO THE LUNGS EVERY 6 HOURS AS NEEDED FOR WHEEZING 10/31/17   [provider]  albuterol (PROVENTIL) (2.5 MG/3ML) 0.083% nebulizer solution Take 2.5 mg by nebulization every 6 (six) hours as needed for wheezing or shortness of breath.    [provider]  ALPRAZolam Duanne Moron) 0.5 MG tablet Take 1 tablet by mouth 2 (two) times daily as needed for  anxiety.  04/19/15   [provider]  aspirin EC 81 MG tablet Take 1 tablet (81 mg total) by mouth daily. 06/28/16   Fritzi Mandes, MD  atenolol (TENORMIN) 50 MG tablet Take 1 tablet (50 mg total) by mouth daily. Patient taking differently: Take 25 mg by mouth daily.  05/18/15   Tama High III, MD  azelastine (ASTELIN) 0.1 % nasal spray Place 1 spray into both nostrils 2 (two) times daily. Use in each nostril as directed    [provider]  butalbital-acetaminophen-caffeine  (FIORICET, ESGIC) 50-325-40 MG tablet Take 1 tablet by mouth every 6 (six) hours as needed.  12/19/17   [provider]  cholecalciferol (VITAMIN D) 1000 units tablet Take 1,000 Units by mouth daily.    [provider]  cyanocobalamin (,VITAMIN B-12,) 1000 MCG/ML injection Inject 1,000 mcg into the muscle every 30 (thirty) days.  03/26/16   [provider]  Fluticasone-Salmeterol (ADVAIR DISKUS) 250-50 MCG/DOSE AEPB INHALE 1 PUFF INTO THE LUNGS EVERY 12 HOURS 12/22/17   [provider]  HYDROcodone-acetaminophen (NORCO) 7.5-325 MG tablet Take 1 tablet by mouth 3 (three) times daily as needed for moderate pain.    [provider]  hydroxychloroquine (PLAQUENIL) 200 MG tablet Take 400 mg by mouth daily.    [provider]  losartan (COZAAR) 50 MG tablet Take 1 tablet (50 mg total) by mouth daily. 05/18/15   Tama High III, MD  pantoprazole (PROTONIX) 20 MG tablet Take 1 tablet (20 mg total) 2 (two) times daily by mouth. 09/23/17 03/04/18  Merlyn Lot, MD  predniSONE (DELTASONE) 10 MG tablet Take 1 tablet (10 mg total) by mouth daily. Label  & dispense according to the schedule below.  6 tablets day one, then 5 table day 2, then 4 tablets day 3, then 3 tablets day 4, 2 tablets day 5, then 1 tablet day 6, then stop 08/10/18   Saundra Shelling, MD  predniSONE (STERAPRED UNI-PAK 21 TAB) 10 MG (21) TBPK tablet Taper by 10 mg daily 09/14/18   Epifanio Lesches, MD     Allergies Ace inhibitors; Alendronate; Coreg [carvedilol]; Duloxetine; Morphine; Pregabalin; Statins; and Verapamil   Family History  Problem Relation Age of Onset  . Heart attack Mother   . Breast cancer Other   . Breast cancer Sister 29  . Breast cancer Sister 36    Social History Social History   Tobacco Use  . Smoking status: Current Some Day Smoker    Packs/day: 0.25    Types: Cigarettes  . Smokeless tobacco: Never Used  Substance Use Topics  . Alcohol use: No  . Drug  use: No    Review of Systems  Constitutional:   No fever positive chills.  ENT:   No sore throat. No rhinorrhea. Cardiovascular:   No chest pain or syncope. Respiratory:   Positive shortness of breath and productive cough. Gastrointestinal:   Negative for abdominal pain, vomiting and diarrhea.  Musculoskeletal:   Negative for focal pain or swelling All other systems reviewed and are negative except as documented above in ROS and HPI.  ____________________________________________   PHYSICAL EXAM:  VITAL SIGNS: ED Triage Vitals  Enc Vitals Group     BP 09/23/18 1751 (!) 150/88     Pulse Rate 09/23/18 1751 (!) 106     Resp 09/23/18 1751 (!) 28     Temp 09/23/18 1751 98.2 F (36.8 C)     Temp Source 09/23/18 1751 Oral  SpO2 09/23/18 1751 96 %     Weight 09/23/18 1753 99 lb (44.9 kg)     Height 09/23/18 1753 5\' 4"  (1.626 m)     Head Circumference --      Peak Flow --      Pain Score 09/23/18 1752 8     Pain Loc --      Pain Edu? --      Excl. in [place]? --     Vital signs reviewed, nursing assessments reviewed.   Constitutional:   Alert and oriented.  Ill-appearing.  Cachectic Eyes:   Conjunctivae are normal. EOMI. PERRL. ENT      Head:   Normocephalic and atraumatic.      Nose:   No congestion/rhinnorhea.       Mouth/Throat:   Dry mucous membranes, no pharyngeal erythema. No peritonsillar mass.       Neck:   No meningismus. Full ROM. Hematological/Lymphatic/Immunilogical:   No cervical lymphadenopathy. Cardiovascular:   Tachycardia heart rate 100. Symmetric bilateral radial and DP pulses.  No murmurs. Cap refill less than 2 seconds. Respiratory:   Increased work of breathing with accessory muscle use.  Tachypnea.  Diffuse expiratory wheezing with prolonged expiratory phase.  No focal crackles.  Symmetric air entry in all lung fields. Gastrointestinal:   Soft and nontender. Non distended. There is no CVA tenderness.  No rebound, rigidity, or guarding. Musculoskeletal:    Normal range of motion in all extremities. No joint effusions.  No lower extremity tenderness.  No edema. Neurologic:   Normal speech and language.  Motor grossly intact. No acute focal neurologic deficits are appreciated.  Skin:    Skin is warm, dry and intact. No rash noted.  No petechiae, purpura, or bullae.  ____________________________________________    LABS (pertinent positives/negatives) (all labs ordered are listed, but only abnormal results are displayed) Labs Reviewed  BASIC METABOLIC PANEL - Abnormal; Notable for the following components:      Result Value   Chloride 90 (*)    CO2 39 (*)    All other components within normal limits  CBC WITH DIFFERENTIAL/PLATELET - Abnormal; Notable for the following components:   WBC 19.3 (*)    Neutro Abs 17.3 (*)    Abs Immature Granulocytes 0.23 (*)    All other components within normal limits  BLOOD GAS, ARTERIAL - Abnormal; Notable for the following components:   pCO2 arterial 59 (*)    pO2, Arterial 60 (*)    Bicarbonate 39.2 (*)    Acid-Base Excess 12.6 (*)    All other components within normal limits  CULTURE, BLOOD (ROUTINE X 2)  CULTURE, BLOOD (ROUTINE X 2)  LACTIC ACID, PLASMA   ____________________________________________   EKG  Interpreted by me Sinus tachycardia rate 103, normal axis and intervals.  Normal QRS ST segments and T waves.  No evidence of right heart strain  ____________________________________________    RADIOLOGY  Dg Chest 2 View  Result Date: 09/23/2018 CLINICAL DATA:  Dyspnea and cough EXAM: CHEST - 2 VIEW COMPARISON:  09/13/2018 FINDINGS: COPD with hyperinflation. Severe apical scarring right greater than left is unchanged. No superimposed edema or pneumonia. No change from the recent study. IMPRESSION: Severe chronic lung disease without interval change. Electronically Signed   By: Franchot Gallo M.D.   On: 09/23/2018 18:40     ____________________________________________   PROCEDURES Procedures  ____________________________________________  DIFFERENTIAL DIAGNOSIS   COPD exacerbation, pneumothorax, pneumonia.  Doubt ACS PE dissection AAA pericarditis.  CLINICAL IMPRESSION / ASSESSMENT  AND PLAN / ED COURSE  Pertinent labs & imaging results that were available during my care of the patient were reviewed by me and considered in my medical decision making (see chart for details).    Patient presents with worsening respiratory symptoms, failed outpatient management.  Given her increased oxygen requirement at this time and persistent symptoms, will give bronchodilators steroids magnesium IV.  Patient will require hospitalization for further management.  Clinical Course as of Sep 23 2000  Wed Sep 23, 2018  1858 Afebrile, no focal findings on lung exam admits diffuse wheezing, chest x-ray without focal infiltrate or consolidation.  I think the leukocytosis is due to recent steroid use.  We will give ceftriaxone and azithromycin for the mortality benefit and COPD exacerbations.  The patient is not septic.  I doubt HCAP at this time despite her recent hospitalizations.  WBC(!): 19.3 [PS]    Clinical Course User Index [PS] Carrie Mew, MD     ____________________________________________   FINAL CLINICAL IMPRESSION(S) / ED DIAGNOSES    Final diagnoses:  COPD exacerbation ([place])  Acute on chronic respiratory failure with hypoxia [facility])     ED Discharge Orders    None      Portions of this note were generated with dragon dictation software. Dictation errors may occur despite best attempts at proofreading.    Carrie Mew, MD 09/23/18 2004

## 2018-09-23 NOTE — Telephone Encounter (Signed)
Erroneous encounter

## 2018-09-23 NOTE — H&P (Addendum)
[place] [facility] [place] NAME: Robin Hudson    MR#:  [id number]  DATE OF BIRTH:  [birth year]  DATE OF ADMISSION:  09/23/2018  PRIMARY CARE PHYSICIAN: Adin Hector, MD   REQUESTING/REFERRING PHYSICIAN:   CHIEF COMPLAINT:   Chief Complaint  Patient presents with  . Shortness of Breath    HISTORY OF PRESENT ILLNESS: Robin Hudson  is a 77 y.o. female with a known history of chronic back pain, breast cancer, colon cancer, hypertension and COPD on  2 L oxygen per nasal cannula, at night. Patient was brought to emergency room for worsening shortness of breath and productive cough going on for the past 7 to 10 days, despite finishing a course of prednisone and a azithromycin.  She denies fever at home, but she has had chills in the past 2 days.  Denies chest pain or palpitations.  Patient has had similar symptoms with prior episodes of COPD exacerbation and pneumonia. Blood test done emergency room, including CBC and CMP are grossly unremarkable except for elevated WBC at 19.3. Chest x-ray shows  COPD with hyperinflation. Severe apical scarring right greater than left is unchanged. She is admitted for further evaluation and treatment.   PAST MEDICAL HISTORY:   Past Medical History:  Diagnosis Date  . Allergic rhinitis   . Anxiety   . Asthma   . B12 deficiency   . Breast cancer ([place]) 1999   RT LUMPECTOMY  . Carcinoma of right breast treated with adjuvant chemotherapy ([place]) 1999   RT LUMPECTOMY  . CHF (congestive heart failure) ([place])   . Colon cancer ([place]) 2010  . Colon cancer ([place])   . COPD (chronic obstructive pulmonary disease) ([place])   . Hyperlipemia   . Hypertension   . MRSA pneumonia ([place])   . Osteoporosis   . Radiation 1999   BREAST CA  . Right adrenal mass ([place])     PAST SURGICAL HISTORY:  Past Surgical History:  Procedure Laterality Date  . ABDOMINAL HYSTERECTOMY    . APPENDECTOMY    . BREAST SURGERY Right     lumpectomy x2 with lymph nodes  . CARPAL TUNNEL RELEASE Left 06/04/2016   Procedure: CARPAL TUNNEL RELEASE;  Surgeon: Hessie Knows, MD;  Location: [facility] ORS;  Service: Orthopedics;  Laterality: Left;  . colectomy     partial  . IMAGE GUIDED SINUS SURGERY    . OPEN REDUCTION INTERNAL FIXATION (ORIF) DISTAL RADIAL FRACTURE Left 06/04/2016   Procedure: OPEN REDUCTION INTERNAL FIXATION (ORIF) DISTAL RADIAL FRACTURE;  Surgeon: Hessie Knows, MD;  Location: [facility] ORS;  Service: Orthopedics;  Laterality: Left;    SOCIAL HISTORY:  Social History   Tobacco Use  . Smoking status: Current Some Day Smoker    Packs/day: 0.25    Types: Cigarettes  . Smokeless tobacco: Never Used  Substance Use Topics  . Alcohol use: No    FAMILY HISTORY:  Family History  Problem Relation Age of Onset  . Heart attack Mother   . Breast cancer Other   . Breast cancer Sister 17  . Breast cancer Sister 76    DRUG ALLERGIES:  Allergies  Allergen Reactions  . Ace Inhibitors     Other reaction(s): Cough  . Alendronate     Other reaction(s): Other (See Comments) GI UPSET  . Coreg [Carvedilol] Other (See Comments)    syncope  . Duloxetine Other (See Comments)  . Morphine Other (See Comments)    confusion  .  Pregabalin     Other reaction(s): Other (See Comments) intolerant  . Statins Other (See Comments)  . Verapamil     Other reaction(s): Other (See Comments) CONSTIPATION    REVIEW OF SYSTEMS:   CONSTITUTIONAL: Positive for chills, fatigue and generalized weakness.  EYES: No changes in vision.  EARS, NOSE, AND THROAT: No tinnitus or ear pain.  RESPIRATORY: Positive for productive cough, shortness of breath, wheezing;  hemoptysis.  CARDIOVASCULAR: No chest pain, orthopnea, edema.  GASTROINTESTINAL: No nausea, vomiting, diarrhea or abdominal pain.  GENITOURINARY: No dysuria, hematuria.  ENDOCRINE: No polyuria, nocturia. HEMATOLOGY: No bleeding. SKIN: No rash or lesion. MUSCULOSKELETAL: No joint pain  at this time.   NEUROLOGIC: No focal weakness.  PSYCHIATRY: Positive history of anxiety and depression.   MEDICATIONS AT HOME:  Prior to Admission medications   Medication Sig Start Date End Date Taking? Authorizing Provider  albuterol (PROAIR HFA) 108 (90 Base) MCG/ACT inhaler Inhale 2 puffs into the lungs every 6 (six) hours as needed for wheezing or shortness of breath.  10/31/17  Yes [provider]  albuterol (PROVENTIL) (2.5 MG/3ML) 0.083% nebulizer solution Take 2.5 mg by nebulization every 6 (six) hours as needed for wheezing or shortness of breath.   Yes [provider]  ALPRAZolam Duanne Moron) 0.5 MG tablet Take 1 tablet by mouth 2 (two) times daily as needed for anxiety.  04/19/15  Yes [provider]  aspirin EC 81 MG tablet Take 1 tablet (81 mg total) by mouth daily. 06/28/16  Yes Fritzi Mandes, MD  atenolol (TENORMIN) 50 MG tablet Take 1 tablet (50 mg total) by mouth daily. Patient taking differently: Take 25 mg by mouth daily.  05/18/15  Yes Tama High III, MD  azelastine (ASTELIN) 0.1 % nasal spray Place 1 spray into both nostrils 2 (two) times daily. Use in each nostril as directed   Yes [provider]  butalbital-acetaminophen-caffeine (FIORICET, ESGIC) 50-325-40 MG tablet Take 1 tablet by mouth every 6 (six) hours as needed.  12/19/17  Yes [provider]  cholecalciferol (VITAMIN D) 1000 units tablet Take 1,000 Units by mouth daily.   Yes [provider]  escitalopram (LEXAPRO) 5 MG tablet Take 5 mg by mouth daily.   Yes [provider]  Fluticasone-Salmeterol (ADVAIR DISKUS) 250-50 MCG/DOSE AEPB Inhale 1 puff into the lungs 2 (two) times daily.  12/22/17  Yes [provider]  HYDROcodone-acetaminophen (NORCO) 7.5-325 MG tablet Take 1 tablet by mouth 3 (three) times daily as needed for moderate pain.   Yes [provider]  hydroxychloroquine (PLAQUENIL) 200 MG tablet Take 400 mg by mouth daily.   Yes  [provider]  losartan (COZAAR) 50 MG tablet Take 1 tablet (50 mg total) by mouth daily. 05/18/15  Yes Tama High III, MD  predniSONE (STERAPRED UNI-PAK 21 TAB) 10 MG (21) TBPK tablet Taper by 10 mg daily 09/14/18  Yes Epifanio Lesches, MD  pantoprazole (PROTONIX) 20 MG tablet Take 1 tablet (20 mg total) 2 (two) times daily by mouth. 09/23/17 03/04/18  Merlyn Lot, MD  predniSONE (DELTASONE) 10 MG tablet Take 1 tablet (10 mg total) by mouth daily. Label  & dispense according to the schedule below.  6 tablets day one, then 5 table day 2, then 4 tablets day 3, then 3 tablets day 4, 2 tablets day 5, then 1 tablet day 6, then stop 08/10/18   Saundra Shelling, MD      PHYSICAL EXAMINATION:   VITAL SIGNS: Blood pressure Marland Kitchen)  164/80, pulse (!) 103, temperature 98.2 F (36.8 C), temperature source Oral, resp. rate (!) 36, height 5\' 4"  (1.626 m), weight 44.9 kg, SpO2 91 %.  GENERAL:  77 y.o.-year-old patient lying in the bed with mild respiratory distress.  Patient looks chronically ill, cachectic. EYES: Pupils equal, round, reactive to light and accommodation. No scleral icterus. Extraocular muscles intact.  HEENT: Head atraumatic, normocephalic. Oropharynx and nasopharynx clear.  NECK:  Supple, no jugular venous distention. No thyroid enlargement, no tenderness.  LUNGS: Reduced breath sounds and wheezing noted bilaterally.  Mild use of accessory muscles of respiration is noted.  CARDIOVASCULAR: S1, S2 normal. No S3/S4.  ABDOMEN: Soft, nontender, nondistended. Bowel sounds present. No organomegaly or mass.  EXTREMITIES: No pedal edema, cyanosis, or clubbing.  NEUROLOGIC: No focal weakness. PSYCHIATRIC: The patient is alert and oriented x 3.  SKIN: No obvious rash, lesion, or ulcer.   LABORATORY PANEL:   CBC Recent Labs  Lab 09/23/18 1758  WBC 19.3*  HGB 13.5  HCT 43.5  PLT 393  MCV 90.6  MCH 28.1  MCHC 31.0  RDW 13.4  LYMPHSABS 0.8  MONOABS 0.8  EOSABS 0.1   BASOSABS 0.1   ------------------------------------------------------------------------------------------------------------------  Chemistries  Recent Labs  Lab 09/23/18 1758  NA 140  K 4.0  CL 90*  CO2 39*  GLUCOSE 88  BUN 11  CREATININE 0.55  CALCIUM 9.4   ------------------------------------------------------------------------------------------------------------------ estimated creatinine clearance is 42.4 mL/min (by C-G formula based on SCr of 0.55 mg/dL). ------------------------------------------------------------------------------------------------------------------ No results for input(s): TSH, T4TOTAL, T3FREE, THYROIDAB in the last 72 hours.  Invalid input(s): FREET3   Coagulation profile No results for input(s): INR, PROTIME in the last 168 hours. ------------------------------------------------------------------------------------------------------------------- No results for input(s): DDIMER in the last 72 hours. -------------------------------------------------------------------------------------------------------------------  Cardiac Enzymes No results for input(s): CKMB, TROPONINI, MYOGLOBIN in the last 168 hours.  Invalid input(s): CK ------------------------------------------------------------------------------------------------------------------ Invalid input(s): POCBNP  ---------------------------------------------------------------------------------------------------------------  Urinalysis    Component Value Date/Time   COLORURINE STRAW (A) 08/08/2018 1441   APPEARANCEUR CLEAR (A) 08/08/2018 1441   APPEARANCEUR Hazy 02/18/2015 0348   LABSPEC 1.016 08/08/2018 1441   LABSPEC 1.008 02/18/2015 0348   PHURINE 7.0 08/08/2018 1441   GLUCOSEU NEGATIVE 08/08/2018 1441   GLUCOSEU Negative 02/18/2015 0348   HGBUR NEGATIVE 08/08/2018 1441   BILIRUBINUR NEGATIVE 08/08/2018 1441   BILIRUBINUR Negative 02/18/2015 0348   KETONESUR NEGATIVE 08/08/2018 1441    PROTEINUR NEGATIVE 08/08/2018 1441   NITRITE NEGATIVE 08/08/2018 1441   LEUKOCYTESUR NEGATIVE 08/08/2018 1441   LEUKOCYTESUR Negative 02/18/2015 0348     RADIOLOGY: Dg Chest 2 View  Result Date: 09/23/2018 CLINICAL DATA:  Dyspnea and cough EXAM: CHEST - 2 VIEW COMPARISON:  09/13/2018 FINDINGS: COPD with hyperinflation. Severe apical scarring right greater than left is unchanged. No superimposed edema or pneumonia. No change from the recent study. IMPRESSION: Severe chronic lung disease without interval change. Electronically Signed   By: Franchot Gallo M.D.   On: 09/23/2018 18:40    EKG: Orders placed or performed during the hospital encounter of 09/13/18  . ED EKG  . ED EKG  . EKG 12-Lead  . EKG 12-Lead  . EKG    IMPRESSION AND PLAN:  1.  Acute COPD exacerbation.  Vision failed treatment with azithromycin and prednisone.  Will start Levaquin IV, steroids IV, nebulizer treatments and oxygen therapy.  Continue to monitor clinically closely. 2.  Acute on chronic respiratory failure with hypoxia, secondary to acute COPD exacerbation.  See treatment as above, under #1. 3.  Right lung nodules.  Patient is to follow-up with pulmonary specialist as outpatient for PET scan. 4.  Severe malnutrition.  Continue supplements per dietitian recommendations. 5.  Chronic back pain, continue home pain medications. 6.  Essential hypertension, stable, resume home medications.  All the records are reviewed and case discussed with ED provider. Management plans discussed with the patient, family and they are in agreement.  CODE STATUS: DNR Code Status History    Date Active Date Inactive Code Status Order ID Comments User Context   09/13/2018 1202 09/14/2018 1604 DNR [id number]  Epifanio Lesches, MD Inpatient   09/13/2018 0612 09/13/2018 1202 Full Code [id number]  Arta Silence, MD Inpatient   12/29/2016 0146 12/29/2016 1429 Full Code [id number]  Lance Coon, MD Inpatient   07/19/2016 2256  07/20/2016 1635 Full Code [id number]  Hugelmeyer, Ubaldo Glassing, DO Inpatient   12/06/2015 0200 12/07/2015 1534 Full Code [id number]  Harrie Foreman, MD Inpatient   12/03/2015 0049 12/04/2015 1721 Full Code [id number]  Hower, Aaron Mose, MD ED   05/12/2015 1509 05/18/2015 1239 Full Code [id number]  Dustin Flock, MD Inpatient    Questions for Most Recent Historical Code Status (Order [id number])    Question Answer Comment   In the event of cardiac or respiratory ARREST Do not call a "code blue"    In the event of cardiac or respiratory ARREST Do not perform Intubation, CPR, defibrillation or ACLS    In the event of cardiac or respiratory ARREST Use medication by any route, position, wound care, and other measures to relive pain and suffering. May use oxygen, suction and manual treatment of airway obstruction as needed for comfort.    Comments NMP        TOTAL TIME TAKING CARE OF THIS PATIENT: 50 minutes.    Amelia Jo M.D on 09/23/2018 at 10:46 PM  Between 7am to 6pm - Pager - [phone]  After 6pm go to [email or web address] - password [email or web address] [facility] [place] [facility]  [phone]  CC: Primary care physician; Adin Hector, MD

## 2018-09-23 NOTE — Telephone Encounter (Signed)
Reached upon second attempt.  Patient reports feeling weak, but no other concerns.  Encouraged her to call her PCP should she have any questions or issues that come up.

## 2018-09-23 NOTE — ED Notes (Signed)
Called floor to give report to floor. Floor wants to call this RN back when nurse is free

## 2018-09-23 NOTE — Progress Notes (Signed)
Pharmacy Antibiotic Note  Robin Hudson is a 77 y.o. female admitted on 09/23/2018 with AECOPD.  Pharmacy has been consulted for Levaquin dosing. Patient received azithromycin/ceftriaxone IV x 1 in ED  Plan: Will start levaquin 500 mg IV x 1 on 11/7 @ 2000 then: Levaquin 250 mg daily per CrCl 20 - 49 ml/min Last EKG 10/29 w/ QTc  402 WNL  Height: 5\' 4"  (162.6 cm) Weight: 99 lb (44.9 kg) IBW/kg (Calculated) : 54.7  Temp (24hrs), Avg:98.2 F (36.8 C), Min:98.2 F (36.8 C), Max:98.2 F (36.8 C)  Recent Labs  Lab 09/23/18 1748 09/23/18 1758  WBC  --  19.3*  CREATININE  --  0.55  LATICACIDVEN 1.0  --     Estimated Creatinine Clearance: 42.4 mL/min (by C-G formula based on SCr of 0.55 mg/dL).    Allergies  Allergen Reactions  . Ace Inhibitors     Other reaction(s): Cough  . Alendronate     Other reaction(s): Other (See Comments) GI UPSET  . Coreg [Carvedilol] Other (See Comments)    syncope  . Duloxetine Other (See Comments)  . Morphine Other (See Comments)    confusion  . Pregabalin     Other reaction(s): Other (See Comments) intolerant  . Statins Other (See Comments)  . Verapamil     Other reaction(s): Other (See Comments) CONSTIPATION    Thank you for allowing pharmacy to be a part of this patient's care.  Tobie Lords, PharmD, BCPS Clinical Pharmacist 09/23/2018

## 2018-09-24 ENCOUNTER — Other Ambulatory Visit: Payer: Self-pay

## 2018-09-24 DIAGNOSIS — E538 Deficiency of other specified B group vitamins: Secondary | ICD-10-CM | POA: Diagnosis present

## 2018-09-24 DIAGNOSIS — Z8249 Family history of ischemic heart disease and other diseases of the circulatory system: Secondary | ICD-10-CM | POA: Diagnosis not present

## 2018-09-24 DIAGNOSIS — E43 Unspecified severe protein-calorie malnutrition: Secondary | ICD-10-CM | POA: Diagnosis present

## 2018-09-24 DIAGNOSIS — I700 Atherosclerosis of aorta: Secondary | ICD-10-CM | POA: Diagnosis present

## 2018-09-24 DIAGNOSIS — Z681 Body mass index (BMI) 19 or less, adult: Secondary | ICD-10-CM | POA: Diagnosis not present

## 2018-09-24 DIAGNOSIS — D72829 Elevated white blood cell count, unspecified: Secondary | ICD-10-CM | POA: Diagnosis present

## 2018-09-24 DIAGNOSIS — Z66 Do not resuscitate: Secondary | ICD-10-CM | POA: Diagnosis present

## 2018-09-24 DIAGNOSIS — F17210 Nicotine dependence, cigarettes, uncomplicated: Secondary | ICD-10-CM | POA: Diagnosis present

## 2018-09-24 DIAGNOSIS — Z803 Family history of malignant neoplasm of breast: Secondary | ICD-10-CM | POA: Diagnosis not present

## 2018-09-24 DIAGNOSIS — I509 Heart failure, unspecified: Secondary | ICD-10-CM | POA: Diagnosis present

## 2018-09-24 DIAGNOSIS — I10 Essential (primary) hypertension: Secondary | ICD-10-CM | POA: Diagnosis not present

## 2018-09-24 DIAGNOSIS — E785 Hyperlipidemia, unspecified: Secondary | ICD-10-CM | POA: Diagnosis present

## 2018-09-24 DIAGNOSIS — Z85038 Personal history of other malignant neoplasm of large intestine: Secondary | ICD-10-CM | POA: Diagnosis not present

## 2018-09-24 DIAGNOSIS — K219 Gastro-esophageal reflux disease without esophagitis: Secondary | ICD-10-CM | POA: Diagnosis present

## 2018-09-24 DIAGNOSIS — G8929 Other chronic pain: Secondary | ICD-10-CM | POA: Diagnosis present

## 2018-09-24 DIAGNOSIS — I110 Hypertensive heart disease with heart failure: Secondary | ICD-10-CM | POA: Diagnosis present

## 2018-09-24 DIAGNOSIS — J441 Chronic obstructive pulmonary disease with (acute) exacerbation: Secondary | ICD-10-CM | POA: Diagnosis present

## 2018-09-24 DIAGNOSIS — M069 Rheumatoid arthritis, unspecified: Secondary | ICD-10-CM | POA: Diagnosis present

## 2018-09-24 DIAGNOSIS — F419 Anxiety disorder, unspecified: Secondary | ICD-10-CM | POA: Diagnosis present

## 2018-09-24 DIAGNOSIS — T380X5A Adverse effect of glucocorticoids and synthetic analogues, initial encounter: Secondary | ICD-10-CM | POA: Diagnosis present

## 2018-09-24 DIAGNOSIS — Z853 Personal history of malignant neoplasm of breast: Secondary | ICD-10-CM | POA: Diagnosis not present

## 2018-09-24 DIAGNOSIS — J9621 Acute and chronic respiratory failure with hypoxia: Secondary | ICD-10-CM | POA: Diagnosis present

## 2018-09-24 DIAGNOSIS — M545 Low back pain: Secondary | ICD-10-CM | POA: Diagnosis present

## 2018-09-24 DIAGNOSIS — Z9981 Dependence on supplemental oxygen: Secondary | ICD-10-CM | POA: Diagnosis not present

## 2018-09-24 DIAGNOSIS — M810 Age-related osteoporosis without current pathological fracture: Secondary | ICD-10-CM | POA: Diagnosis present

## 2018-09-24 LAB — CBC
HCT: 39.1 % (ref 36.0–46.0)
HEMOGLOBIN: 12.2 g/dL (ref 12.0–15.0)
MCH: 28 pg (ref 26.0–34.0)
MCHC: 31.2 g/dL (ref 30.0–36.0)
MCV: 89.9 fL (ref 80.0–100.0)
Platelets: 367 10*3/uL (ref 150–400)
RBC: 4.35 MIL/uL (ref 3.87–5.11)
RDW: 13.2 % (ref 11.5–15.5)
WBC: 18.3 10*3/uL — ABNORMAL HIGH (ref 4.0–10.5)
nRBC: 0 % (ref 0.0–0.2)

## 2018-09-24 LAB — BASIC METABOLIC PANEL
Anion gap: 10 (ref 5–15)
BUN: 10 mg/dL (ref 8–23)
CO2: 36 mmol/L — AB (ref 22–32)
CREATININE: 0.39 mg/dL — AB (ref 0.44–1.00)
Calcium: 8.8 mg/dL — ABNORMAL LOW (ref 8.9–10.3)
Chloride: 93 mmol/L — ABNORMAL LOW (ref 98–111)
GFR calc Af Amer: >60 mL/min (ref >60)
GFR calc non Af Amer: >60 mL/min (ref >60)
Glucose, Bld: 146 mg/dL — ABNORMAL HIGH (ref 70–99)
POTASSIUM: 4.2 mmol/L (ref 3.5–5.1)
Sodium: 139 mmol/L (ref 135–145)

## 2018-09-24 LAB — PROCALCITONIN: Procalcitonin: <0.1 ng/mL

## 2018-09-24 LAB — GLUCOSE, CAPILLARY: Glucose-Capillary: 137 mg/dL — ABNORMAL HIGH (ref 70–99)

## 2018-09-24 MED ORDER — ACETAMINOPHEN 325 MG PO TABS: 650.0000 mg | ORAL_TABLET | Freq: Four times a day (QID) | ORAL | Status: DC | PRN

## 2018-09-24 MED ORDER — AZELASTINE HCL 0.1 % NA SOLN
1.0000 | NASAL | Status: DC | PRN
  Filled 2018-09-24: qty 30

## 2018-09-24 MED ORDER — HYDROXYCHLOROQUINE SULFATE 200 MG PO TABS
400.0000 mg | ORAL_TABLET | Freq: Every day | ORAL | Status: DC
  Administered 2018-09-24 – 2018-09-26 (×3): 400 mg via ORAL
  Filled 2018-09-24 (×3): qty 2

## 2018-09-24 MED ORDER — TRAZODONE HCL 50 MG PO TABS: 25.0000 mg | ORAL_TABLET | Freq: Every evening | ORAL | Status: DC | PRN

## 2018-09-24 MED ORDER — ADULT MULTIVITAMIN W/MINERALS CH
1.0000 | ORAL_TABLET | Freq: Every day | ORAL | Status: DC
  Administered 2018-09-24 – 2018-09-26 (×3): 1 via ORAL
  Filled 2018-09-24 (×3): qty 1

## 2018-09-24 MED ORDER — DOCUSATE SODIUM 100 MG PO CAPS
100.0000 mg | ORAL_CAPSULE | Freq: Two times a day (BID) | ORAL | Status: DC
  Administered 2018-09-24 – 2018-09-26 (×4): 100 mg via ORAL
  Filled 2018-09-24 (×4): qty 1

## 2018-09-24 MED ORDER — ATENOLOL 25 MG PO TABS
25.0000 mg | ORAL_TABLET | Freq: Every day | ORAL | Status: DC
  Administered 2018-09-24 – 2018-09-26 (×3): 25 mg via ORAL
  Filled 2018-09-24 (×3): qty 1

## 2018-09-24 MED ORDER — ESCITALOPRAM OXALATE 10 MG PO TABS
5.0000 mg | ORAL_TABLET | Freq: Every day | ORAL | Status: DC
  Administered 2018-09-24 – 2018-09-26 (×3): 5 mg via ORAL
  Filled 2018-09-24 (×3): qty 0.5

## 2018-09-24 MED ORDER — ALPRAZOLAM 0.5 MG PO TABS
0.5000 mg | ORAL_TABLET | Freq: Two times a day (BID) | ORAL | Status: DC | PRN
  Administered 2018-09-25 – 2018-09-26 (×2): 0.5 mg via ORAL
  Filled 2018-09-24 (×2): qty 1

## 2018-09-24 MED ORDER — BISACODYL 5 MG PO TBEC
5.0000 mg | DELAYED_RELEASE_TABLET | Freq: Every day | ORAL | Status: DC | PRN
  Administered 2018-09-25: 5 mg via ORAL
  Filled 2018-09-24: qty 1

## 2018-09-24 MED ORDER — LOSARTAN POTASSIUM 50 MG PO TABS
50.0000 mg | ORAL_TABLET | Freq: Every day | ORAL | Status: DC
  Administered 2018-09-24 – 2018-09-26 (×2): 50 mg via ORAL
  Filled 2018-09-24 (×2): qty 1

## 2018-09-24 MED ORDER — FLUTICASONE FUROATE-VILANTEROL 200-25 MCG/INH IN AEPB
1.0000 | INHALATION_SPRAY | Freq: Every day | RESPIRATORY_TRACT | Status: DC
  Administered 2018-09-24 – 2018-09-26 (×3): 1 via RESPIRATORY_TRACT
  Filled 2018-09-24: qty 28

## 2018-09-24 MED ORDER — VITAMIN D 25 MCG (1000 UNIT) PO TABS
1000.0000 [IU] | ORAL_TABLET | Freq: Every day | ORAL | Status: DC
  Administered 2018-09-24 – 2018-09-26 (×3): 1000 [IU] via ORAL

## 2018-09-24 MED ORDER — ASPIRIN EC 81 MG PO TBEC
81.0000 mg | DELAYED_RELEASE_TABLET | Freq: Every day | ORAL | Status: DC
  Administered 2018-09-24 – 2018-09-26 (×3): 81 mg via ORAL
  Filled 2018-09-24 (×3): qty 1

## 2018-09-24 MED ORDER — IPRATROPIUM-ALBUTEROL 0.5-2.5 (3) MG/3ML IN SOLN
3.0000 mL | Freq: Four times a day (QID) | RESPIRATORY_TRACT | Status: DC
  Administered 2018-09-24 – 2018-09-26 (×10): 3 mL via RESPIRATORY_TRACT
  Filled 2018-09-24 (×10): qty 3

## 2018-09-24 MED ORDER — SODIUM CHLORIDE 0.9 % IV SOLN
Freq: Once | INTRAVENOUS | Status: AC
  Administered 2018-09-24: 02:00:00 via INTRAVENOUS

## 2018-09-24 MED ORDER — PANTOPRAZOLE SODIUM 20 MG PO TBEC
20.0000 mg | DELAYED_RELEASE_TABLET | Freq: Two times a day (BID) | ORAL | Status: DC
  Administered 2018-09-24 – 2018-09-26 (×5): 20 mg via ORAL
  Filled 2018-09-24 (×7): qty 1

## 2018-09-24 MED ORDER — HYDROCODONE-ACETAMINOPHEN 7.5-325 MG PO TABS
1.0000 | ORAL_TABLET | Freq: Three times a day (TID) | ORAL | Status: DC | PRN
  Administered 2018-09-24 – 2018-09-26 (×7): 1 via ORAL
  Filled 2018-09-24 (×7): qty 1

## 2018-09-24 MED ORDER — ENSURE ENLIVE PO LIQD
237.0000 mL | Freq: Two times a day (BID) | ORAL | Status: DC
  Administered 2018-09-24 – 2018-09-25 (×4): 237 mL via ORAL

## 2018-09-24 MED ORDER — ONDANSETRON HCL 4 MG PO TABS: 4.0000 mg | ORAL_TABLET | Freq: Four times a day (QID) | ORAL | Status: DC | PRN

## 2018-09-24 MED ORDER — METHYLPREDNISOLONE SODIUM SUCC 125 MG IJ SOLR
80.0000 mg | Freq: Two times a day (BID) | INTRAMUSCULAR | Status: DC
  Administered 2018-09-24 – 2018-09-25 (×4): 80 mg via INTRAVENOUS
  Filled 2018-09-24 (×5): qty 2

## 2018-09-24 MED ORDER — ACETAMINOPHEN 650 MG RE SUPP: 650.0000 mg | Freq: Four times a day (QID) | RECTAL | Status: DC | PRN

## 2018-09-24 MED ORDER — LIDOCAINE 5 % EX PTCH
1.0000 | MEDICATED_PATCH | CUTANEOUS | Status: DC
  Administered 2018-09-24 – 2018-09-25 (×2): 1 via TRANSDERMAL
  Filled 2018-09-24 (×3): qty 1

## 2018-09-24 MED ORDER — HEPARIN SODIUM (PORCINE) 5000 UNIT/ML IJ SOLN
5000.0000 [IU] | Freq: Three times a day (TID) | INTRAMUSCULAR | Status: DC
  Administered 2018-09-24 – 2018-09-26 (×7): 5000 [IU] via SUBCUTANEOUS
  Filled 2018-09-24 (×7): qty 1

## 2018-09-24 MED ORDER — ONDANSETRON HCL 4 MG/2ML IJ SOLN: 4.0000 mg | Freq: Four times a day (QID) | INTRAMUSCULAR | Status: DC | PRN

## 2018-09-24 NOTE — Progress Notes (Signed)
   09/24/18 1419  PT Visit Information  Last PT Received On 09/24/18  Reason Eval/Treat Not Completed Other (comment);Patient declined, no reason specified  Patient refused, stating, "I just can't do this right now, my back is killing me." RN informed of severe back pain (7/10); RN informed patient that she can't have any additional pain meds right now and encouraged activity, but patient declined.  Will re-attempt PT eval when patient is appropriate and medically ready.

## 2018-09-24 NOTE — Progress Notes (Signed)
[facility] [place] NAME: Robin Hudson    MR#:  [id number]  DATE OF BIRTH:  April 26, 1941  SUBJECTIVE:  CHIEF COMPLAINT:   Chief Complaint  Patient presents with  . Shortness of Breath   - complains of low back pain, has chronic pain issues. -Still has significant shortness of breath and cough  REVIEW OF SYSTEMS:  Review of Systems  Constitutional: Negative for chills, fever and malaise/fatigue.  HENT: Negative for ear discharge, hearing loss and nosebleeds.   Eyes: Negative for blurred vision and double vision.  Respiratory: Positive for cough and shortness of breath. Negative for wheezing.   Cardiovascular: Positive for orthopnea. Negative for chest pain and palpitations.  Gastrointestinal: Negative for abdominal pain, constipation, diarrhea, nausea and vomiting.  Genitourinary: Negative for dysuria.  Musculoskeletal: Positive for back pain and myalgias.  Neurological: Negative for dizziness, focal weakness, seizures, weakness and headaches.  Psychiatric/Behavioral: Negative for depression.    DRUG ALLERGIES:   Allergies  Allergen Reactions  . Ace Inhibitors     Other reaction(s): Cough  . Alendronate     Other reaction(s): Other (See Comments) GI UPSET  . Coreg [Carvedilol] Other (See Comments)    syncope  . Duloxetine Other (See Comments)  . Morphine Other (See Comments)    confusion  . Pregabalin     Other reaction(s): Other (See Comments) intolerant  . Statins Other (See Comments)  . Verapamil     Other reaction(s): Other (See Comments) CONSTIPATION    VITALS:  Blood pressure 139/80, pulse (!) 102, temperature (!) 97.5 F (36.4 C), temperature source Oral, resp. rate 16, height 5\' 4"  (1.626 m), weight 43.2 kg, SpO2 91 %.  PHYSICAL EXAMINATION:  Physical Exam  GENERAL:  77 y.o.-year-old ill nourished patient lying in the bed with no acute distress.  EYES: Pupils equal, round, reactive to light and  accommodation. No scleral icterus. Extraocular muscles intact.  HEENT: Head atraumatic, normocephalic. Oropharynx and nasopharynx clear.  NECK:  Supple, no jugular venous distention. No thyroid enlargement, no tenderness.  LUNGS: Moving air bilaterally with scant breath sounds posteriorly.  Scattered expiratory wheezing is heard, no rales,rhonchi or crepitation. No use of accessory muscles of respiration.  CARDIOVASCULAR: S1, S2 normal. No  rubs, or gallops.2/6 systolic murmur present  ABDOMEN: Soft, nontender, nondistended. Bowel sounds present. No organomegaly or mass.  EXTREMITIES: No pedal edema, cyanosis, or clubbing.  NEUROLOGIC: Cranial nerves II through XII are intact. Muscle strength 5/5 in all extremities. Sensation intact. Gait not checked. Global weakness noted. PSYCHIATRIC: The patient is alert and oriented x 3.  SKIN: No obvious rash, lesion, or ulcer.    LABORATORY PANEL:   CBC Recent Labs  Lab 09/24/18 0356  WBC 18.3*  HGB 12.2  HCT 39.1  PLT 367   ------------------------------------------------------------------------------------------------------------------  Chemistries  Recent Labs  Lab 09/24/18 0356  NA 139  K 4.2  CL 93*  CO2 36*  GLUCOSE 146*  BUN 10  CREATININE 0.39*  CALCIUM 8.8*   ------------------------------------------------------------------------------------------------------------------  Cardiac Enzymes No results for input(s): TROPONINI in the last 168 hours. ------------------------------------------------------------------------------------------------------------------  RADIOLOGY:  Dg Chest 2 View  Result Date: 09/23/2018 CLINICAL DATA:  Dyspnea and cough EXAM: CHEST - 2 VIEW COMPARISON:  09/13/2018 FINDINGS: COPD with hyperinflation. Severe apical scarring right greater than left is unchanged. No superimposed edema or pneumonia. No change from the recent study. IMPRESSION: Severe chronic lung disease without interval change.  Electronically Signed   By: Franchot Gallo  M.D.   On: 09/23/2018 18:40    EKG:   Orders placed or performed during the hospital encounter of 09/13/18  . ED EKG  . ED EKG  . EKG 12-Lead  . EKG 12-Lead  . EKG    ASSESSMENT AND PLAN:   78-year-old female with past medical history significant for history of breast cancer and colon cancer in remission, hypertension, chronic respiratory failure secondary to COPD on 2 L home oxygen, chronic low back pain due to degenerative disc disease presents to hospital secondary to worsening shortness of breath and cough.  1.  Acute on chronic COPD exacerbation-failed outpatient treatment -Continue IV steroids for today, nebs and inhalers -On Levaquin.  Procalcitonin is negative.  Continue for today.  Patient complains of productive cough. -Requiring 3 L oxygen at this time.  2.  Leukocytosis-likely secondary to being on steroids as outpatient prior to admission.  3.  Chronic low back pain-add a Lidoderm patch. -On Norco.  Also has rheumatoid arthritis and takes Plaquenil.  4.  Hypertension-on atenolol and losartan  5.  GERD-PPI  6.  DVT prophylaxis- SQ heparin  PT consulted, lives at home with husband     All the records are reviewed and case discussed with Care Management/Social Workerr. Management plans discussed with the patient, family and they are in agreement.  CODE STATUS: DNR  TOTAL TIME TAKING CARE OF THIS PATIENT: 38 minutes.   POSSIBLE D/C IN 1-2  DAYS, DEPENDING ON CLINICAL CONDITION.   Gladstone Lighter M.D on 09/24/2018 at 1:34 PM  Between 7am to 6pm - Pager - [phone]  After 6pm go to [email or web address] - password [email or web address] [facility]  Office  [phone]  CC: Primary care physician; Adin Hector, MD

## 2018-09-25 DIAGNOSIS — I10 Essential (primary) hypertension: Secondary | ICD-10-CM | POA: Diagnosis not present

## 2018-09-25 DIAGNOSIS — J9621 Acute and chronic respiratory failure with hypoxia: Secondary | ICD-10-CM | POA: Diagnosis not present

## 2018-09-25 DIAGNOSIS — E43 Unspecified severe protein-calorie malnutrition: Secondary | ICD-10-CM | POA: Diagnosis not present

## 2018-09-25 DIAGNOSIS — J441 Chronic obstructive pulmonary disease with (acute) exacerbation: Secondary | ICD-10-CM | POA: Diagnosis not present

## 2018-09-25 LAB — BASIC METABOLIC PANEL
Anion gap: 6 (ref 5–15)
BUN: 24 mg/dL — AB (ref 8–23)
CO2: 39 mmol/L — ABNORMAL HIGH (ref 22–32)
CREATININE: 0.62 mg/dL (ref 0.44–1.00)
Calcium: 8.9 mg/dL (ref 8.9–10.3)
Chloride: 93 mmol/L — ABNORMAL LOW (ref 98–111)
GFR calc Af Amer: >60 mL/min (ref >60)
Glucose, Bld: 111 mg/dL — ABNORMAL HIGH (ref 70–99)
Potassium: 4.9 mmol/L (ref 3.5–5.1)
SODIUM: 138 mmol/L (ref 135–145)

## 2018-09-25 LAB — GLUCOSE, CAPILLARY: GLUCOSE-CAPILLARY: 123 mg/dL — AB (ref 70–99)

## 2018-09-25 LAB — MAGNESIUM: MAGNESIUM: 2 mg/dL (ref 1.7–2.4)

## 2018-09-25 MED ORDER — ENSURE ENLIVE PO LIQD
237.0000 mL | Freq: Three times a day (TID) | ORAL | Status: DC
  Administered 2018-09-25: 237 mL via ORAL

## 2018-09-25 MED ORDER — METHYLPREDNISOLONE SODIUM SUCC 40 MG IJ SOLR
40.0000 mg | Freq: Two times a day (BID) | INTRAMUSCULAR | Status: DC
  Administered 2018-09-26 (×2): 40 mg via INTRAVENOUS
  Filled 2018-09-25 (×2): qty 1

## 2018-09-25 MED ORDER — LEVOFLOXACIN 500 MG PO TABS
250.0000 mg | ORAL_TABLET | Freq: Every day | ORAL | Status: DC
  Administered 2018-09-25: 250 mg via ORAL
  Filled 2018-09-25: qty 1

## 2018-09-25 NOTE — Care Management Important Message (Signed)
Important Message  Patient Details  Name: Robin Hudson MRN: [id number] Date of Birth: October 01, 1941   Medicare Important Message Given:  Yes    Juliann Pulse A Alveta Quintela 09/25/2018, 10:40 AM

## 2018-09-25 NOTE — Consult Note (Signed)
   [facility] CM Inpatient Consult   09/25/2018  NEIRA BENTSEN 30-Mar-1941 [id number]   Patient screened for [facility] Management services. Patient is reviewed for high risk score of 24% and for unplanned readmissions. Patient has had 3 hospitalizations in the past 6 months. Patient with [employer]. Chart review reveals patient presented with shortness of breath, acute on chronic COPD exacerbation.   Patient not active with [facility] at this time.   Will continue to follow progression plans.  Netta Cedars, MSN, [facility] Liaison Nurse [phone] Business mobile phone Toll free office [phone]

## 2018-09-25 NOTE — Progress Notes (Signed)
PT Cancellation Note  Patient Details Name: Robin Hudson MRN: [id number] DOB: 10/09/1941   Cancelled Treatment:    Reason Eval/Treat Not Completed: Patient declined PT eval again this afternoon secondary to back pain.  Pt stated was given a pain patch and would be agreeable to attempting PT eval tomorrow.  Will attempt to see pt at a future date as appropriate.     Linus Salmons PT, DPT 09/25/18, 4:14 PM

## 2018-09-25 NOTE — Progress Notes (Signed)
Initial Nutrition Assessment  DOCUMENTATION CODES:   Underweight, Non-severe (moderate) malnutrition in context of chronic illness  INTERVENTION:  -Ensure Enlive po TID, each supplement provides 350 kcal and 20 grams of protein -Magic cup TID with meals, each supplement provides 290 kcal and 9 grams of protein Pt prefers orange cream -Snacks   NUTRITION DIAGNOSIS:   Moderate Malnutrition related to chronic illness(COPD) as evidenced by mild fat depletion, mild muscle depletion, percent weight loss.   GOAL:   Patient will meet greater than or equal to 90% of their needs   MONITOR:   PO intake, Weight trends, Supplement acceptance  REASON FOR ASSESSMENT:   Other (Comment)(low BMI)    ASSESSMENT:  77 year old female on 2L O2 at home admitted w/ COPD exacerbation and SOB x 7days. Recent 10/28 [facility] w/ COPD hyperinflation. PMH: Breast Ca w/ rt lumpectomy, colon cancer, B12 deficiency, CHF, Osteoporosis, HLD, HTN, Appendectomy, Abdominal hysterectomy.  Pt sitting up in the bed and picking at lunch tray at visit; reports not caring for the mystery-meat meatloaf, but enjoying the broccoli and rice. Pt reports having a lot of pain in her back today and having a  difficult time getting the pain under control.   Pt endorses good appetite PTA and 3 meals a day. Breakfast pt reports liking eggs, bacon, and toast. Lunch is typically a sandwich w/ soup or chips and dinner pt and husband like to go out to eat or go to their son's house for a meal.   Pt reports UBW 147lbs and recalls that wt 68mo ago. Pt denies changes in appetite, eating habits, or n/v/d attributing to wt loses. Pt reports drinking Enusre at home and likes the strawberry flavor and open to increasing EE to TID during stay, Magic Cup orange cream, and snacks to include grilled cheese, fresh fruit, or chicken salad sandwiches.    NUTRITION - FOCUSED PHYSICAL EXAM:    Most Recent Value  Orbital Region  Moderate depletion   Upper Arm Region  Moderate depletion  Thoracic and Lumbar Region  Unable to assess [pt c/o back pain]  Buccal Region  Mild depletion  Temple Region  Mild depletion  Clavicle Bone Region  Moderate depletion  Clavicle and Acromion Bone Region  Mild depletion  Scapular Bone Region  Unable to assess  Dorsal Hand  Moderate depletion  Patellar Region  Unable to assess  Anterior Thigh Region  Unable to assess  Posterior Calf Region  Unable to assess  Edema (RD Assessment)  None  Hair  Reviewed  Eyes  Reviewed  Mouth  Reviewed  Skin  Reviewed  Nails  Reviewed       Diet Order:   Diet Order            Diet regular Room service appropriate? Yes; Fluid consistency: Thin  Diet effective now              EDUCATION NEEDS:   No education needs have been identified at this time  Skin:  Skin Assessment: Reviewed RN Assessment(ecchymosis)  Last BM:  11/6  Height:   Ht Readings from Last 1 Encounters:  09/23/18 5\' 4"  (1.626 m)    Weight:   Wt Readings from Last 1 Encounters:  09/25/18 43.5 kg   09/13/18 42.7 kg  08/09/18 44.9 kg  07/12/18 45.4 kg  03/04/18 46.7 kg    Ideal Body Weight:  54.7 kg  BMI:  Body mass index is 16.46 kg/m.  Estimated Nutritional Needs:   Kcal:  9747-1855  Protein:  57-69g  Fluid:  1.2-1.3L  Lajuan Lines, RD, LDN  After Hours/Weekend Pager: [phone]   Lajuan Lines, RD, LDN  After Hours/Weekend Pager: [phone]

## 2018-09-25 NOTE — Progress Notes (Addendum)
Pharmacy Antibiotic Note  Robin Hudson is a 77 y.o. female admitted on 09/23/2018 with AECOPD.  Pharmacy has been consulted for Levaquin dosing. Patient received azithromycin/ceftriaxone IV x 1 in ED  Plan: Patient received levaquin 500 mg IV x 1 on 11/7. Patient is tolerating other PO medications.   Will start  Levaquin 250 mg PO daily based on  CrCl 20 - 49 ml/min  EKG 10/29 w/ QTc  402 WNL  Height: 5\' 4"  (162.6 cm) Weight: 95 lb 14.4 oz (43.5 kg) IBW/kg (Calculated) : 54.7  Temp (24hrs), Avg:97.8 F (36.6 C), Min:97.5 F (36.4 C), Max:98.6 F (37 C)  Recent Labs  Lab 09/23/18 1748 09/23/18 1758 09/24/18 0356  WBC  --  19.3* 18.3*  CREATININE  --  0.55 0.39*  LATICACIDVEN 1.0  --   --     Estimated Creatinine Clearance: 41.1 mL/min (A) (by C-G formula based on SCr of 0.39 mg/dL (L)).    Allergies  Allergen Reactions  . Ace Inhibitors     Other reaction(s): Cough  . Alendronate     Other reaction(s): Other (See Comments) GI UPSET  . Coreg [Carvedilol] Other (See Comments)    syncope  . Duloxetine Other (See Comments)  . Morphine Other (See Comments)    confusion  . Pregabalin     Other reaction(s): Other (See Comments) intolerant  . Statins Other (See Comments)  . Verapamil     Other reaction(s): Other (See Comments) CONSTIPATION    Thank you for allowing pharmacy to be a part of this patient's care.  Pernell Dupre, PharmD, BCPS Clinical Pharmacist 09/25/2018 8:09 AM

## 2018-09-25 NOTE — Progress Notes (Signed)
[facility] [place] NAME: Robin Hudson    MR#:  [id number]  DATE OF BIRTH:  August 22, 1941  SUBJECTIVE:  CHIEF COMPLAINT:   Chief Complaint  Patient presents with  . Shortness of Breath   - breathing is improving - still complains of significant low back pain  REVIEW OF SYSTEMS:  Review of Systems  Constitutional: Positive for malaise/fatigue. Negative for chills and fever.  HENT: Negative for congestion, ear discharge, hearing loss and nosebleeds.   Eyes: Negative for blurred vision and double vision.  Respiratory: Negative for cough, shortness of breath and wheezing.   Cardiovascular: Negative for chest pain, palpitations and leg swelling.  Gastrointestinal: Negative for abdominal pain, constipation, diarrhea, nausea and vomiting.  Genitourinary: Negative for dysuria.  Musculoskeletal: Positive for back pain and myalgias.  Neurological: Negative for dizziness, focal weakness, seizures, weakness and headaches.  Psychiatric/Behavioral: Negative for depression.    DRUG ALLERGIES:   Allergies  Allergen Reactions  . Ace Inhibitors     Other reaction(s): Cough  . Alendronate     Other reaction(s): Other (See Comments) GI UPSET  . Coreg [Carvedilol] Other (See Comments)    syncope  . Duloxetine Other (See Comments)  . Morphine Other (See Comments)    confusion  . Pregabalin     Other reaction(s): Other (See Comments) intolerant  . Statins Other (See Comments)  . Verapamil     Other reaction(s): Other (See Comments) CONSTIPATION    VITALS:  Blood pressure 116/60, pulse 74, temperature (!) 97.5 F (36.4 C), temperature source Oral, resp. rate 18, height 5\' 4"  (1.626 m), weight 43.5 kg, SpO2 96 %.  PHYSICAL EXAMINATION:  Physical Exam  GENERAL:  77 y.o.-year-old ill nourished patient lying in the bed with no acute distress.  EYES: Pupils equal, round, reactive to light and accommodation. No scleral icterus. Extraocular  muscles intact.  HEENT: Head atraumatic, normocephalic. Oropharynx and nasopharynx clear.  NECK:  Supple, no jugular venous distention. No thyroid enlargement, no tenderness.  LUNGS: Moving air bilaterally with scant breath sounds posteriorly.  Scattered expiratory wheezing is heard, no rales,rhonchi or crepitation. No use of accessory muscles of respiration.  CARDIOVASCULAR: S1, S2 normal. No  rubs, or gallops.2/6 systolic murmur present  ABDOMEN: Soft, nontender, nondistended. Bowel sounds present. No organomegaly or mass.  EXTREMITIES: No pedal edema, cyanosis, or clubbing.  NEUROLOGIC: Cranial nerves II through XII are intact. Muscle strength 5/5 in all extremities. Sensation intact. Gait not checked. Global weakness noted. PSYCHIATRIC: The patient is alert and oriented x 3.  SKIN: No obvious rash, lesion, or ulcer.    LABORATORY PANEL:   CBC Recent Labs  Lab 09/24/18 0356  WBC 18.3*  HGB 12.2  HCT 39.1  PLT 367   ------------------------------------------------------------------------------------------------------------------  Chemistries  Recent Labs  Lab 09/24/18 0356  NA 139  K 4.2  CL 93*  CO2 36*  GLUCOSE 146*  BUN 10  CREATININE 0.39*  CALCIUM 8.8*   ------------------------------------------------------------------------------------------------------------------  Cardiac Enzymes No results for input(s): TROPONINI in the last 168 hours. ------------------------------------------------------------------------------------------------------------------  RADIOLOGY:  Dg Chest 2 View  Result Date: 09/23/2018 CLINICAL DATA:  Dyspnea and cough EXAM: CHEST - 2 VIEW COMPARISON:  09/13/2018 FINDINGS: COPD with hyperinflation. Severe apical scarring right greater than left is unchanged. No superimposed edema or pneumonia. No change from the recent study. IMPRESSION: Severe chronic lung disease without interval change. Electronically Signed   By: Franchot Gallo M.D.   On:  09/23/2018 18:40  EKG:   Orders placed or performed during the hospital encounter of 09/13/18  . ED EKG  . ED EKG  . EKG 12-Lead  . EKG 12-Lead  . EKG    ASSESSMENT AND PLAN:   77-year-old female with past medical history significant for history of breast cancer and colon cancer in remission, hypertension, chronic respiratory failure secondary to COPD on 2 L home oxygen, chronic low back pain due to degenerative disc disease presents to hospital secondary to worsening shortness of breath and cough.  1.  Acute on chronic COPD exacerbation-failed outpatient treatment -receiving IV steroids- decrease dose today, nebs and inhalers -On Levaquin.  Procalcitonin is negative.   Patient complains of productive cough. -Requiring 3 L oxygen at this time. On chronic 2l home o2 - improving  2.  Leukocytosis-likely secondary to being on steroids as outpatient prior to admission.  3.  Chronic low back pain-added a Lidoderm patch. K pad added -On Norco.  Also has rheumatoid arthritis and takes Plaquenil.  4.  Hypertension-on atenolol and losartan  5.  GERD-PPI  6.  DVT prophylaxis- SQ heparin  PT consulted, lives at home with husband     All the records are reviewed and case discussed with Care Management/Social Workerr. Management plans discussed with the patient, family and they are in agreement.  CODE STATUS: Full Code- discussed with patient at bedside on 09/25/18- RN Anderson Malta 1A present as well  TOTAL TIME TAKING CARE OF THIS PATIENT: 37 minutes.   POSSIBLE D/C TOMORROW, DEPENDING ON CLINICAL CONDITION.   Gladstone Lighter M.D on 09/25/2018 at 1:13 PM  Between 7am to 6pm - Pager - [phone]  After 6pm go to [email or web address] - password [email or web address] [facility]  Office  [phone]  CC: Primary care physician; Adin Hector, MD

## 2018-09-25 NOTE — Progress Notes (Signed)
PT Cancellation Note  Patient Details Name: Robin Hudson MRN: [id number] DOB: 17-Nov-1941   Cancelled Treatment:    Reason Eval/Treat Not Completed: Patient declined PT eval this AM secondary to back pain with nursing aware.  Pt stated would attempt PT eval in the PM.  Will attempt to see pt later this date as schedule allows.     Linus Salmons PT, DPT 09/25/18, 11:37 AM

## 2018-09-26 DIAGNOSIS — I10 Essential (primary) hypertension: Secondary | ICD-10-CM | POA: Diagnosis not present

## 2018-09-26 DIAGNOSIS — E43 Unspecified severe protein-calorie malnutrition: Secondary | ICD-10-CM | POA: Diagnosis not present

## 2018-09-26 DIAGNOSIS — J441 Chronic obstructive pulmonary disease with (acute) exacerbation: Secondary | ICD-10-CM | POA: Diagnosis not present

## 2018-09-26 DIAGNOSIS — J9621 Acute and chronic respiratory failure with hypoxia: Secondary | ICD-10-CM | POA: Diagnosis not present

## 2018-09-26 LAB — BASIC METABOLIC PANEL
Anion gap: 7 (ref 5–15)
BUN: 29 mg/dL — ABNORMAL HIGH (ref 8–23)
CO2: 37 mmol/L — ABNORMAL HIGH (ref 22–32)
Calcium: 8.7 mg/dL — ABNORMAL LOW (ref 8.9–10.3)
Chloride: 94 mmol/L — ABNORMAL LOW (ref 98–111)
Creatinine, Ser: 0.55 mg/dL (ref 0.44–1.00)
GFR calc Af Amer: >60 mL/min (ref >60)
GFR calc non Af Amer: >60 mL/min (ref >60)
Glucose, Bld: 113 mg/dL — ABNORMAL HIGH (ref 70–99)
Potassium: 4.6 mmol/L (ref 3.5–5.1)
Sodium: 138 mmol/L (ref 135–145)

## 2018-09-26 LAB — GLUCOSE, CAPILLARY: GLUCOSE-CAPILLARY: 96 mg/dL (ref 70–99)

## 2018-09-26 LAB — CBC
HCT: 34.4 % — ABNORMAL LOW (ref 36.0–46.0)
Hemoglobin: 10.8 g/dL — ABNORMAL LOW (ref 12.0–15.0)
MCH: 28.1 pg (ref 26.0–34.0)
MCHC: 31.4 g/dL (ref 30.0–36.0)
MCV: 89.6 fL (ref 80.0–100.0)
PLATELETS: 360 10*3/uL (ref 150–400)
RBC: 3.84 MIL/uL — AB (ref 3.87–5.11)
RDW: 13.2 % (ref 11.5–15.5)
WBC: 22.6 10*3/uL — ABNORMAL HIGH (ref 4.0–10.5)
nRBC: 0 % (ref 0.0–0.2)

## 2018-09-26 MED ORDER — PREDNISONE 10 MG (21) PO TBPK: ORAL_TABLET | ORAL | 0 refills | Status: DC

## 2018-09-26 MED ORDER — LIDOCAINE 5 % EX PTCH: 1.0000 | MEDICATED_PATCH | CUTANEOUS | 0 refills | Status: DC

## 2018-09-26 MED ORDER — LEVOFLOXACIN 250 MG PO TABS: 250.0000 mg | ORAL_TABLET | Freq: Every day | ORAL | 0 refills | Status: DC

## 2018-09-26 NOTE — Evaluation (Signed)
Physical Therapy Evaluation Patient Details Name: Robin Hudson MRN: [id number] DOB: 1941-01-17 Today's Date: 09/26/2018   History of Present Illness  Robin Hudson is a 77yo female who comes to [facility] on 11/6 c SOB x7d. PMH: chronic low back pain, BrCA, colon CA, HTN, COPD on 2L overnight.   Clinical Impression  Pt admitted with above diagnosis. Pt currently with functional limitations due to the deficits listed below (see "PT Problem List"). Upon entry, pt in bed, no family/caregiver present. The pt is awakened easily and agreeable to participate. The pt is alert and oriented x3, pleasant, conversational, and following simple commands consistently. Pt reports O2 use at night only, hence trial of AMB on room air is performed, with terminal SpO2: 77%- pt returned to O2 via [place]. Back pain is fairly limiting and severe, but pt is motivated to mobilize. Functional mobility assessment demonstrates increased effort/time requirements, poor tolerance, and need for physical assistance, whereas the patient performed these at a higher level of independence PTA. Pt will benefit from skilled PT intervention to increase independence and safety with basic mobility in preparation for discharge to the venue listed below.       Follow Up Recommendations Home health PT    Equipment Recommendations  None recommended by PT    Recommendations for Other Services       Precautions / Restrictions Precautions Precautions: Fall Restrictions Weight Bearing Restrictions: No      Mobility  Bed Mobility Overal bed mobility: Modified Independent             General bed mobility comments: additional time needed, moderate effoort only.   Transfers Overall transfer level: Needs assistance Equipment used: None Transfers: Sit to/from Stand Sit to Stand: Supervision         General transfer comment: additional time needed, moderate effort only, antalgic d/t back pain  Ambulation/Gait Ambulation/Gait  assistance: Min guard Gait Distance (Feet): 200 Feet Assistive device: None(intermittent wall/railing use) Gait Pattern/deviations: Antalgic Gait velocity: 0.68ms  Gait velocity interpretation: <1.31 ft/sec, indicative of household ambulator General Gait Details: termianl SpO2: 77% on RA   Stairs            Wheelchair Mobility    Modified Rankin (Stroke Patients Only)       Balance Overall balance assessment: Mild deficits observed, not formally tested(gait unsteady from severe low back pain)                                           Pertinent Vitals/Pain Pain Assessment: 0-10 Pain Score: 8  Pain Location: low back Pain Intervention(s): Limited activity within patient's tolerance;Monitored during session;Repositioned    Home Living Family/patient expects to be discharged to:: Private residence Living Arrangements: Spouse/significant other Available Help at Discharge: Family Type of Home: House Home Access: Stairs to enter Entrance Stairs-Rails: None ETechnical brewerof Steps: 1 Home Layout: One level Home Equipment: Walker - 2 wheels(home O2 )      Prior Function Level of Independence: Independent with assistive device(s)         Comments: used RW intermittently and would hold onto furniture when walking; reports being independent in bathing and dressing;      Hand Dominance   Dominant Hand: Right    Extremity/Trunk Assessment                Communication   Communication: HOH  Cognition Arousal/Alertness:  Awake/alert Behavior During Therapy: WFL for tasks assessed/performed Overall Cognitive Status: Within Functional Limits for tasks assessed                                        General Comments      Exercises     Assessment/Plan    PT Assessment Patient needs continued PT services  PT Problem List Decreased strength;Decreased range of motion;Decreased activity tolerance;Decreased  balance;Decreased mobility;Decreased coordination;Cardiopulmonary status limiting activity       PT Treatment Interventions Balance training;DME instruction;Gait training;Functional mobility training;Therapeutic activities;Therapeutic exercise;Patient/family education    PT Goals (Current goals can be found in the Care Plan section)  Acute Rehab PT Goals Patient Stated Goal: be more mobile while admitted PT Goal Formulation: With patient Time For Goal Achievement: 10/10/18 Potential to Achieve Goals: Fair    Frequency Min 2X/week   Barriers to discharge        Co-evaluation               AM-PAC PT "6 Clicks" Daily Activity  Outcome Measure Difficulty turning over in bed (including adjusting bedclothes, sheets and blankets)?: A Lot Difficulty moving from lying on back to sitting on the side of the bed? : A Lot Difficulty sitting down on and standing up from a chair with arms (e.g., wheelchair, bedside commode, etc,.)?: A Lot Help needed moving to and from a bed to chair (including a wheelchair)?: A Little Help needed walking in hospital room?: A Little Help needed climbing 3-5 steps with a railing? : A Little 6 Click Score: 15    End of Session Equipment Utilized During Treatment: Gait belt;Oxygen Activity Tolerance: Patient tolerated treatment well;Treatment limited secondary to medical complications (Comment)(desaturation with difficult resaturation) Patient left: in chair;with call bell/phone within reach;with SCD's reapplied Nurse Communication: Mobility status PT Visit Diagnosis: Unsteadiness on feet (R26.81);Difficulty in walking, not elsewhere classified (R26.2);Muscle weakness (generalized) (M62.81)    Time: 0801-0820 PT Time Calculation (min) (ACUTE ONLY): 19 min   Charges:   PT Evaluation $PT Eval Moderate Complexity: 1 Mod PT Treatments $Therapeutic Activity: 8-22 mins        11:02 AM, 09/26/18 [name], PT, DPT Physical Therapist - [facility]  [phone] ([place])    Buccola,Allan C 09/26/2018, 11:01 AM

## 2018-09-26 NOTE — Discharge Planning (Signed)
Patient IV and tele removed.  RN assessment and VS revealed stability for DC to home.  Patient refusing to have HH/PT set up.  Discharge papers given, explained and educated. Informed of suggested FU appt and patient agreed to call to set up, since discharging on weekend.  Prednisone script printed and given - rest e-scribed to [facility] on [address].  When ready, will be wheeled to front and family transporting home via car

## 2018-09-26 NOTE — Progress Notes (Signed)
   09/26/18 1109  PT Visit Information  Last PT Received On 09/26/18  Assistance Needed +1  History of Present Illness Robin Hudson is a 77yo female who comes to [facility] on 11/6 c SOB x7d. PMH: chronic low back pain, BrCA, colon CA, HTN, COPD on 2L overnight.    The following vitals assessed during functional mobility assessment 0810-0818, 09/26/18.    11:10 AM, 09/26/18 [name], PT, DPT Physical Therapist - [facility]  [phone] [facility])

## 2018-09-28 LAB — CULTURE, BLOOD (ROUTINE X 2)
CULTURE: NO GROWTH
Special Requests: ADEQUATE

## 2018-10-01 NOTE — Discharge Summary (Signed)
Robin Hudson, is a 77 y.o. female  DOB 1941-03-09  MRN [id number].  Admission date:  09/23/2018  Admitting Physician  Amelia Jo, MD  Discharge Date:  09/26/2018   Primary MD  Adin Hector, MD  Recommendations for primary care physician for things to follow:   Follow with PCP in 1 week   Admission Diagnosis  COPD exacerbation ([place]) [J44.1] Acute on chronic respiratory failure with hypoxia (HCC) [J96.21] COPD with acute exacerbation ([place]) [J44.1]   Discharge Diagnosis  COPD exacerbation ([place]) [J44.1] Acute on chronic respiratory failure with hypoxia ([place]) [J96.21] COPD with acute exacerbation (HCC) [J44.1]    Active Problems:   COPD with acute exacerbation ([place])      Past Medical History:  Diagnosis Date  . Allergic rhinitis   . Anxiety   . Asthma   . B12 deficiency   . Breast cancer ([place]) 1999   RT LUMPECTOMY  . Carcinoma of right breast treated with adjuvant chemotherapy ([place]) 1999   RT LUMPECTOMY  . CHF (congestive heart failure) ([place])   . Colon cancer ([place]) 2010  . Colon cancer ([place])   . COPD (chronic obstructive pulmonary disease) ([place])   . Hyperlipemia   . Hypertension   . MRSA pneumonia ([place])   . Osteoporosis   . Radiation 1999   BREAST CA  . Right adrenal mass [facility])     Past Surgical History:  Procedure Laterality Date  . ABDOMINAL HYSTERECTOMY    . APPENDECTOMY    . BREAST SURGERY Right    lumpectomy x2 with lymph nodes  . CARPAL TUNNEL RELEASE Left 06/04/2016   Procedure: CARPAL TUNNEL RELEASE;  Surgeon: Hessie Knows, MD;  Location: [facility] ORS;  Service: Orthopedics;  Laterality: Left;  . colectomy     partial  . IMAGE GUIDED SINUS SURGERY    . OPEN REDUCTION INTERNAL FIXATION (ORIF) DISTAL RADIAL FRACTURE Left 06/04/2016   Procedure: OPEN REDUCTION INTERNAL FIXATION (ORIF) DISTAL  RADIAL FRACTURE;  Surgeon: Hessie Knows, MD;  Location: [facility] ORS;  Service: Orthopedics;  Laterality: Left;       History of present illness and  Hospital Course:     Kindly see H&P for history of present illness and admission details, please review complete Labs, Consult reports and Test reports for all details in brief  HPI  from the history and physical done on the day of admission 64-year-old female patient with history of colon cancer, hypertension, COPD on 2 L of oxygen at night comes in because of shortness of breath, admitted for COPD exacerbation.   Hospital Course  #1.[facility] on chronic COPD exacerbation, failed outpatient therapy, patient received IV steroids, bronchodilators, Levaquin, patient procalcitonin has been negative, chest x-ray did not show pneumonia.  Patient is on chronic oxygen 2 L at home.  Patient's respiratory condition improved, discharged home in stable condition, patient is high risk for recurrent admissions, admitted 3 times the last 6 months.  #2. leukocytosis secondary to steroids. 3.  Chronic low back pain, patient has history of rheumatoid arthritis, takes Plaquenil, added Lidoderm patch at this time. 4.  Essential hypertension: Controlled, continue atenolol, losartan. #5/ generalized weakness, physical therapy at home health physical therapy but patient refused.   Discharge Condition: Stable  Follow UP  Follow-up Information    Adin Hector, MD Follow up.   Specialty:  Internal Medicine Why:   pt can call the PCP on Monday and make that appointment in 1 week Contact information: [place]  [place] [address] [phone]             Discharge Instructions  and  Discharge Medications     Allergies as of 09/26/2018      Reactions   Ace Inhibitors    Other reaction(s): Cough   Alendronate    Other reaction(s): Other (See Comments) GI UPSET   Coreg [carvedilol] Other (See Comments)   syncope   Duloxetine Other (See  Comments)   Morphine Other (See Comments)   confusion   Pregabalin    Other reaction(s): Other (See Comments) intolerant   Statins Other (See Comments)   Verapamil    Other reaction(s): Other (See Comments) CONSTIPATION      Medication List    TAKE these medications   ADVAIR DISKUS 250-50 MCG/DOSE Aepb Generic drug:  Fluticasone-Salmeterol Inhale 1 puff into the lungs 2 (two) times daily.   albuterol (2.5 MG/3ML) 0.083% nebulizer solution Commonly known as:  PROVENTIL Take 2.5 mg by nebulization every 6 (six) hours as needed for wheezing or shortness of breath.   PROAIR HFA 108 (90 Base) MCG/ACT inhaler Generic drug:  albuterol Inhale 2 puffs into the lungs every 6 (six) hours as needed for wheezing or shortness of breath.   ALPRAZolam 0.5 MG tablet Commonly known as:  XANAX Take 1 tablet by mouth 2 (two) times daily as needed for anxiety.   aspirin EC 81 MG tablet Take 1 tablet (81 mg total) by mouth daily.   atenolol 50 MG tablet Commonly known as:  TENORMIN Take 1 tablet (50 mg total) by mouth daily. What changed:  how much to take   azelastine 0.1 % nasal spray Commonly known as:  ASTELIN Place 1 spray into both nostrils 2 (two) times daily. Use in each nostril as directed   butalbital-acetaminophen-caffeine 50-325-40 MG tablet Commonly known as:  FIORICET, ESGIC Take 1 tablet by mouth every 6 (six) hours as needed.   cholecalciferol 1000 units tablet Commonly known as:  VITAMIN D Take 1,000 Units by mouth daily.   escitalopram 5 MG tablet Commonly known as:  LEXAPRO Take 5 mg by mouth daily.   HYDROcodone-acetaminophen 7.5-325 MG tablet Commonly known as:  NORCO Take 1 tablet by mouth 3 (three) times daily as needed for moderate pain.   hydroxychloroquine 200 MG tablet Commonly known as:  PLAQUENIL Take 400 mg by mouth daily.   levofloxacin 250 MG tablet Commonly known as:  LEVAQUIN Take 1 tablet (250 mg total) by mouth daily.   lidocaine 5  % Commonly known as:  LIDODERM Place 1 patch onto the skin daily. Remove & Discard patch within 12 hours or as directed by MD   losartan 50 MG tablet Commonly known as:  COZAAR Take 1 tablet (50 mg total) by mouth daily.   pantoprazole 20 MG tablet Commonly known as:  PROTONIX Take 1 tablet (20 mg total) 2 (two) times daily by mouth.   predniSONE 10 MG (21) Tbpk tablet Commonly known as:  STERAPRED UNI-PAK 21 TAB Taper by 10 mg daily What changed:  Another medication with the same name was removed. Continue taking this medication, and follow the directions you see here.         Diet and Activity recommendation: See Discharge Instructions above   Consults obtained -inpatient consult with [facility] case manager  Major procedures and Radiology Reports - PLEASE review detailed and final reports for all details, in brief -     Dg Chest 2 View  Result Date: 09/23/2018  CLINICAL DATA:  Dyspnea and cough EXAM: CHEST - 2 VIEW COMPARISON:  09/13/2018 FINDINGS: COPD with hyperinflation. Severe apical scarring right greater than left is unchanged. No superimposed edema or pneumonia. No change from the recent study. IMPRESSION: Severe chronic lung disease without interval change. Electronically Signed   By: Franchot Gallo M.D.   On: 09/23/2018 18:40   Dg Chest Port 1 View  Result Date: 09/13/2018 CLINICAL DATA:  Shortness of breath EXAM: PORTABLE CHEST 1 VIEW COMPARISON:  Chest radiograph and chest CT August 08, 2018 FINDINGS: There is scarring with volume loss/asymmetric pleural thickening in the right apex. A lesser degree of scarring is noted in the left apex. There is also scarring in the right base. There is no frank edema or consolidation. The heart size is normal. The pulmonary vascularity is stable with a degree of distortion due to scarring. No adenopathy evident. There are surgical clips in the right axilla with evidence of prior mastectomy on the right. There is aortic  atherosclerosis. No bone lesions. IMPRESSION: Areas of scarring in the upper lobes, more on the right than on the left. There is asymmetric apical pleural thickening on the right. No edema or consolidation. Stable cardiac silhouette. There is aortic atherosclerosis. Aortic Atherosclerosis (ICD10-I70.0). Electronically Signed   By: Lowella Grip III M.D.   On: 09/13/2018 02:18    Micro Results    Recent Results (from the past 240 hour(s))  Blood culture (routine x 2)     Status: None   Collection Time: 09/23/18  5:48 PM  Result Value Ref Range Status   Specimen Description BLOOD BLOOD RIGHT FOREARM  Final   Special Requests   Final    BOTTLES DRAWN AEROBIC AND ANAEROBIC Blood Culture adequate volume   Culture   Final    NO GROWTH 5 DAYS Performed at [facility], [address] [place], [place] [address]    Report Status 09/28/2018 FINAL  Final       Today   Subjective:   Robin Hudson today has no headache,no chest abdominal pain,no new weakness tingling or numbness, feels much better wants to go home today.   Objective:   Blood pressure (!) 129/58, pulse 63, temperature 97.7 F (36.5 C), temperature source Oral, resp. rate 16, height 5\' 4"  (1.626 m), weight 44.6 kg, SpO2 95 %.  No intake or output data in the 24 hours ending 10/01/18 1644  Exam Awake Alert, Oriented x 3, No new F.N deficits, Normal affect [place].AT,PERRAL Supple Neck,No JVD, No cervical lymphadenopathy appriciated.  Symmetrical Chest wall movement, Good air movement bilaterally, CTAB RRR,No Gallops,Rubs or new Murmurs, No Parasternal Heave +ve B.Sounds, Abd Soft, Non tender, No organomegaly appriciated, No rebound -guarding or rigidity. No Cyanosis, Clubbing or edema, No new Rash or bruise  Data Review   CBC w Diff:  Lab Results  Component Value Date   WBC 22.6 (H) 09/26/2018   HGB 10.8 (L) 09/26/2018   HGB 11.1 (L) 03/10/2015   HCT 34.4 (L) 09/26/2018   HCT 35.1 03/10/2015   PLT 360  09/26/2018   PLT 397 03/10/2015   LYMPHOPCT 4 09/23/2018   LYMPHOPCT 8.3 03/10/2015   MONOPCT 4 09/23/2018   MONOPCT 4.6 03/10/2015   EOSPCT 0 09/23/2018   EOSPCT 0.1 03/10/2015   BASOPCT 0 09/23/2018   BASOPCT 0.3 03/10/2015    CMP:  Lab Results  Component Value Date   NA 138 09/26/2018   NA 135 03/11/2015   K 4.6 09/26/2018  K 3.6 03/11/2015   CL 94 (L) 09/26/2018   CL 94 (L) 03/11/2015   CO2 37 (H) 09/26/2018   CO2 33 (H) 03/11/2015   BUN 29 (H) 09/26/2018   BUN 19 03/11/2015   CREATININE 0.55 09/26/2018   CREATININE 0.64 03/11/2015   PROT 6.4 (L) 09/13/2018   PROT 5.9 (L) 03/06/2015   ALBUMIN 3.7 09/13/2018   ALBUMIN 2.2 (L) 03/06/2015   BILITOT 0.4 09/13/2018   BILITOT 0.9 03/06/2015   ALKPHOS 59 09/13/2018   ALKPHOS 75 03/06/2015   AST 19 09/13/2018   AST 15 03/06/2015   ALT 13 09/13/2018   ALT 11 (L) 03/06/2015  .   Total Time in preparing paper work, data evaluation and todays exam - 35 minutes  Epifanio Lesches M.D on 09/26/2018 at 4:44 PM    Note: This dictation was prepared with Dragon dictation along with smaller phrase technology. Any transcriptional errors that result from this process are unintentional.

## 2018-10-04 DIAGNOSIS — J449 Chronic obstructive pulmonary disease, unspecified: Secondary | ICD-10-CM | POA: Diagnosis not present

## 2018-10-06 DIAGNOSIS — I119 Hypertensive heart disease without heart failure: Secondary | ICD-10-CM | POA: Diagnosis not present

## 2018-10-06 DIAGNOSIS — I2510 Atherosclerotic heart disease of native coronary artery without angina pectoris: Secondary | ICD-10-CM | POA: Diagnosis not present

## 2018-10-06 DIAGNOSIS — J439 Emphysema, unspecified: Secondary | ICD-10-CM | POA: Diagnosis not present

## 2018-10-06 DIAGNOSIS — I10 Essential (primary) hypertension: Secondary | ICD-10-CM | POA: Diagnosis not present

## 2018-10-06 DIAGNOSIS — I43 Cardiomyopathy in diseases classified elsewhere: Secondary | ICD-10-CM | POA: Diagnosis not present

## 2018-10-07 DIAGNOSIS — R079 Chest pain, unspecified: Secondary | ICD-10-CM | POA: Diagnosis not present

## 2018-10-08 ENCOUNTER — Inpatient Hospital Stay
Admit: 2018-10-08 | Discharge: 2018-10-08 | Disposition: A | Discharge status: Home / Self Care | Payer: PPO | Attending: Internal Medicine | Admitting: Internal Medicine

## 2018-10-08 ENCOUNTER — Encounter
Admission: EM | Disposition: A | Discharge status: Home / Self Care | Payer: Self-pay | Source: Home / Self Care | Attending: Internal Medicine

## 2018-10-08 ENCOUNTER — Inpatient Hospital Stay
Admission: EM | Admit: 2018-10-08 | Discharge: 2018-10-09 | DRG: 280 | Disposition: A | Discharge status: Home / Self Care | Payer: PPO | Attending: Internal Medicine | Admitting: Internal Medicine

## 2018-10-08 ENCOUNTER — Emergency Department: Payer: PPO

## 2018-10-08 ENCOUNTER — Other Ambulatory Visit: Payer: Self-pay

## 2018-10-08 ENCOUNTER — Encounter: Payer: Self-pay | Admitting: *Deleted

## 2018-10-08 DIAGNOSIS — R7989 Other specified abnormal findings of blood chemistry: Secondary | ICD-10-CM | POA: Diagnosis not present

## 2018-10-08 DIAGNOSIS — Z85038 Personal history of other malignant neoplasm of large intestine: Secondary | ICD-10-CM

## 2018-10-08 DIAGNOSIS — J9621 Acute and chronic respiratory failure with hypoxia: Secondary | ICD-10-CM | POA: Diagnosis not present

## 2018-10-08 DIAGNOSIS — I472 Ventricular tachycardia: Secondary | ICD-10-CM | POA: Diagnosis present

## 2018-10-08 DIAGNOSIS — M069 Rheumatoid arthritis, unspecified: Secondary | ICD-10-CM | POA: Diagnosis not present

## 2018-10-08 DIAGNOSIS — Z7982 Long term (current) use of aspirin: Secondary | ICD-10-CM

## 2018-10-08 DIAGNOSIS — J441 Chronic obstructive pulmonary disease with (acute) exacerbation: Secondary | ICD-10-CM | POA: Diagnosis present

## 2018-10-08 DIAGNOSIS — E782 Mixed hyperlipidemia: Secondary | ICD-10-CM | POA: Diagnosis present

## 2018-10-08 DIAGNOSIS — I2510 Atherosclerotic heart disease of native coronary artery without angina pectoris: Secondary | ICD-10-CM | POA: Diagnosis present

## 2018-10-08 DIAGNOSIS — R0789 Other chest pain: Secondary | ICD-10-CM | POA: Diagnosis not present

## 2018-10-08 DIAGNOSIS — J9601 Acute respiratory failure with hypoxia: Secondary | ICD-10-CM | POA: Diagnosis not present

## 2018-10-08 DIAGNOSIS — I509 Heart failure, unspecified: Secondary | ICD-10-CM | POA: Diagnosis not present

## 2018-10-08 DIAGNOSIS — R739 Hyperglycemia, unspecified: Secondary | ICD-10-CM | POA: Diagnosis not present

## 2018-10-08 DIAGNOSIS — Z7951 Long term (current) use of inhaled steroids: Secondary | ICD-10-CM | POA: Diagnosis not present

## 2018-10-08 DIAGNOSIS — Z853 Personal history of malignant neoplasm of breast: Secondary | ICD-10-CM

## 2018-10-08 DIAGNOSIS — I10 Essential (primary) hypertension: Secondary | ICD-10-CM | POA: Diagnosis not present

## 2018-10-08 DIAGNOSIS — Z888 Allergy status to other drugs, medicaments and biological substances status: Secondary | ICD-10-CM | POA: Diagnosis not present

## 2018-10-08 DIAGNOSIS — J9602 Acute respiratory failure with hypercapnia: Secondary | ICD-10-CM | POA: Diagnosis not present

## 2018-10-08 DIAGNOSIS — F17210 Nicotine dependence, cigarettes, uncomplicated: Secondary | ICD-10-CM | POA: Diagnosis present

## 2018-10-08 DIAGNOSIS — I4892 Unspecified atrial flutter: Secondary | ICD-10-CM | POA: Diagnosis not present

## 2018-10-08 DIAGNOSIS — Z9981 Dependence on supplemental oxygen: Secondary | ICD-10-CM

## 2018-10-08 DIAGNOSIS — R0989 Other specified symptoms and signs involving the circulatory and respiratory systems: Secondary | ICD-10-CM | POA: Diagnosis not present

## 2018-10-08 DIAGNOSIS — Z885 Allergy status to narcotic agent status: Secondary | ICD-10-CM

## 2018-10-08 DIAGNOSIS — Z923 Personal history of irradiation: Secondary | ICD-10-CM

## 2018-10-08 DIAGNOSIS — I4891 Unspecified atrial fibrillation: Secondary | ICD-10-CM | POA: Diagnosis not present

## 2018-10-08 DIAGNOSIS — Z79899 Other long term (current) drug therapy: Secondary | ICD-10-CM | POA: Diagnosis not present

## 2018-10-08 DIAGNOSIS — F419 Anxiety disorder, unspecified: Secondary | ICD-10-CM | POA: Diagnosis not present

## 2018-10-08 DIAGNOSIS — I700 Atherosclerosis of aorta: Secondary | ICD-10-CM | POA: Diagnosis not present

## 2018-10-08 DIAGNOSIS — I214 Non-ST elevation (NSTEMI) myocardial infarction: Secondary | ICD-10-CM | POA: Diagnosis not present

## 2018-10-08 DIAGNOSIS — R079 Chest pain, unspecified: Secondary | ICD-10-CM | POA: Diagnosis present

## 2018-10-08 DIAGNOSIS — E860 Dehydration: Secondary | ICD-10-CM | POA: Diagnosis not present

## 2018-10-08 DIAGNOSIS — M810 Age-related osteoporosis without current pathological fracture: Secondary | ICD-10-CM | POA: Diagnosis not present

## 2018-10-08 DIAGNOSIS — Z9221 Personal history of antineoplastic chemotherapy: Secondary | ICD-10-CM

## 2018-10-08 DIAGNOSIS — I110 Hypertensive heart disease with heart failure: Secondary | ICD-10-CM | POA: Diagnosis present

## 2018-10-08 DIAGNOSIS — D638 Anemia in other chronic diseases classified elsewhere: Secondary | ICD-10-CM | POA: Diagnosis present

## 2018-10-08 HISTORY — PX: LEFT HEART CATH AND CORONARY ANGIOGRAPHY: CATH118249

## 2018-10-08 LAB — GLUCOSE, CAPILLARY: GLUCOSE-CAPILLARY: 102 mg/dL — AB (ref 70–99)

## 2018-10-08 LAB — CBC
HCT: 34.3 % — ABNORMAL LOW (ref 36.0–46.0)
HEMATOCRIT: 29.9 % — AB (ref 36.0–46.0)
Hemoglobin: 10.6 g/dL — ABNORMAL LOW (ref 12.0–15.0)
Hemoglobin: 9.2 g/dL — ABNORMAL LOW (ref 12.0–15.0)
MCH: 28.2 pg (ref 26.0–34.0)
MCH: 28.4 pg (ref 26.0–34.0)
MCHC: 30.9 g/dL (ref 30.0–36.0)
MCHC: 30.8 g/dL (ref 30.0–36.0)
MCV: 91.2 fL (ref 80.0–100.0)
MCV: 92.3 fL (ref 80.0–100.0)
NRBC: 0 % (ref 0.0–0.2)
Platelets: 312 10*3/uL (ref 150–400)
Platelets: 253 10*3/uL (ref 150–400)
RBC: 3.76 MIL/uL — ABNORMAL LOW (ref 3.87–5.11)
RBC: 3.24 MIL/uL — ABNORMAL LOW (ref 3.87–5.11)
RDW: 13.6 % (ref 11.5–15.5)
RDW: 13.7 % (ref 11.5–15.5)
WBC: 11.8 10*3/uL — ABNORMAL HIGH (ref 4.0–10.5)
WBC: 9.7 10*3/uL (ref 4.0–10.5)
nRBC: 0 % (ref 0.0–0.2)

## 2018-10-08 LAB — HEPATIC FUNCTION PANEL
ALT: 18 U/L (ref 0–44)
AST: 29 U/L (ref 15–41)
Albumin: 3.1 g/dL — ABNORMAL LOW (ref 3.5–5.0)
Alkaline Phosphatase: 53 U/L (ref 38–126)
BILIRUBIN TOTAL: 0.3 mg/dL (ref 0.3–1.2)
Total Protein: 6 g/dL — ABNORMAL LOW (ref 6.5–8.1)

## 2018-10-08 LAB — BASIC METABOLIC PANEL
Anion gap: 6 (ref 5–15)
BUN: 19 mg/dL (ref 8–23)
CO2: 34 mmol/L — AB (ref 22–32)
CREATININE: 0.87 mg/dL (ref 0.44–1.00)
Calcium: 8.3 mg/dL — ABNORMAL LOW (ref 8.9–10.3)
Chloride: 102 mmol/L (ref 98–111)
GFR calc Af Amer: >60 mL/min (ref >60)
GLUCOSE: 117 mg/dL — AB (ref 70–99)
Potassium: 4.1 mmol/L (ref 3.5–5.1)
Sodium: 142 mmol/L (ref 135–145)

## 2018-10-08 LAB — ECHOCARDIOGRAM COMPLETE
Height: 64 in
Weight: 1583.78 oz

## 2018-10-08 LAB — BLOOD GAS, VENOUS
Acid-Base Excess: 9.4 mmol/L — ABNORMAL HIGH (ref 0.0–2.0)
BICARBONATE: 38.4 mmol/L — AB (ref 20.0–28.0)
Patient temperature: 37
pCO2, Ven: 78 mmHg (ref 44.0–60.0)
pH, Ven: 7.3 (ref 7.250–7.430)

## 2018-10-08 LAB — MRSA PCR SCREENING: MRSA BY PCR: NEGATIVE

## 2018-10-08 LAB — PREALBUMIN: PREALBUMIN: 15.5 mg/dL — AB (ref 18–38)

## 2018-10-08 LAB — HEPARIN LEVEL (UNFRACTIONATED): HEPARIN UNFRACTIONATED: 0.35 [IU]/mL (ref 0.30–0.70)

## 2018-10-08 LAB — PROTIME-INR
INR: 0.9
PROTHROMBIN TIME: 12.1 s (ref 11.4–15.2)

## 2018-10-08 LAB — TROPONIN I
TROPONIN I: 3.92 ng/mL — AB (ref <0.03)
TROPONIN I: 7.06 ng/mL — AB (ref <0.03)
Troponin I: 0.74 ng/mL (ref <0.03)

## 2018-10-08 LAB — PHOSPHORUS: Phosphorus: 3.1 mg/dL (ref 2.5–4.6)

## 2018-10-08 LAB — TSH: TSH: 1.199 u[IU]/mL (ref 0.350–4.500)

## 2018-10-08 LAB — APTT: aPTT: 30 seconds (ref 24–36)

## 2018-10-08 LAB — MAGNESIUM: MAGNESIUM: 2 mg/dL (ref 1.7–2.4)

## 2018-10-08 SURGERY — LEFT HEART CATH AND CORONARY ANGIOGRAPHY
Anesthesia: Moderate Sedation

## 2018-10-08 MED ORDER — AMIODARONE HCL IN DEXTROSE 360-4.14 MG/200ML-% IV SOLN: 60.0000 mg/h | INTRAVENOUS | Status: DC

## 2018-10-08 MED ORDER — ALPRAZOLAM 0.5 MG PO TABS
0.5000 mg | ORAL_TABLET | Freq: Two times a day (BID) | ORAL | Status: DC | PRN
  Administered 2018-10-08 – 2018-10-09 (×3): 0.5 mg via ORAL
  Filled 2018-10-08 (×3): qty 1

## 2018-10-08 MED ORDER — PREDNISONE 20 MG PO TABS: 40.0000 mg | ORAL_TABLET | Freq: Every day | ORAL | Status: DC

## 2018-10-08 MED ORDER — ONDANSETRON HCL 4 MG/2ML IJ SOLN: 4.0000 mg | Freq: Four times a day (QID) | INTRAMUSCULAR | Status: DC | PRN

## 2018-10-08 MED ORDER — SODIUM CHLORIDE 0.9 % IV SOLN: 250.0000 mL | INTRAVENOUS | Status: DC | PRN

## 2018-10-08 MED ORDER — IPRATROPIUM-ALBUTEROL 0.5-2.5 (3) MG/3ML IN SOLN: 3.0000 mL | RESPIRATORY_TRACT | Status: DC | PRN

## 2018-10-08 MED ORDER — DILTIAZEM HCL 25 MG/5ML IV SOLN
5.0000 mg | Freq: Once | INTRAVENOUS | Status: AC
  Administered 2018-10-08: 5 mg via INTRAVENOUS

## 2018-10-08 MED ORDER — ESCITALOPRAM OXALATE 10 MG PO TABS
5.0000 mg | ORAL_TABLET | Freq: Every day | ORAL | Status: DC
  Administered 2018-10-08 – 2018-10-09 (×2): 5 mg via ORAL
  Filled 2018-10-08 (×2): qty 0.5

## 2018-10-08 MED ORDER — PREDNISONE 10 MG PO TABS: 10.0000 mg | ORAL_TABLET | Freq: Every day | ORAL | Status: DC

## 2018-10-08 MED ORDER — IPRATROPIUM-ALBUTEROL 0.5-2.5 (3) MG/3ML IN SOLN
3.0000 mL | Freq: Once | RESPIRATORY_TRACT | Status: AC
  Administered 2018-10-08: 3 mL via RESPIRATORY_TRACT

## 2018-10-08 MED ORDER — SODIUM CHLORIDE 0.9 % IV BOLUS
1000.0000 mL | Freq: Once | INTRAVENOUS | Status: AC
  Administered 2018-10-08: 1000 mL via INTRAVENOUS

## 2018-10-08 MED ORDER — VITAMIN D3 25 MCG (1000 UNIT) PO TABS
1000.0000 [IU] | ORAL_TABLET | Freq: Every day | ORAL | Status: DC
  Administered 2018-10-08 – 2018-10-09 (×2): 1000 [IU] via ORAL
  Filled 2018-10-08 (×2): qty 1

## 2018-10-08 MED ORDER — DILTIAZEM HCL 25 MG/5ML IV SOLN
10.0000 mg | Freq: Once | INTRAVENOUS | Status: DC
  Filled 2018-10-08: qty 5

## 2018-10-08 MED ORDER — ASPIRIN 81 MG PO CHEW
324.0000 mg | CHEWABLE_TABLET | Freq: Once | ORAL | Status: AC
  Administered 2018-10-08: 324 mg via ORAL
  Filled 2018-10-08: qty 4

## 2018-10-08 MED ORDER — ACETAMINOPHEN 325 MG PO TABS: 650.0000 mg | ORAL_TABLET | ORAL | Status: DC | PRN

## 2018-10-08 MED ORDER — HYDROMORPHONE HCL 1 MG/ML IJ SOLN: 0.5000 mg | INTRAMUSCULAR | Status: DC | PRN

## 2018-10-08 MED ORDER — ASPIRIN 81 MG PO CHEW
81.0000 mg | CHEWABLE_TABLET | Freq: Every day | ORAL | Status: DC
  Administered 2018-10-08 – 2018-10-09 (×2): 81 mg via ORAL
  Filled 2018-10-08 (×2): qty 1

## 2018-10-08 MED ORDER — SODIUM CHLORIDE 0.9 % IV SOLN
500.0000 mg | INTRAVENOUS | Status: DC
  Administered 2018-10-08: 500 mg via INTRAVENOUS
  Filled 2018-10-08 (×2): qty 500

## 2018-10-08 MED ORDER — BISACODYL 5 MG PO TBEC: 5.0000 mg | DELAYED_RELEASE_TABLET | Freq: Every day | ORAL | Status: DC | PRN

## 2018-10-08 MED ORDER — METHYLPREDNISOLONE SODIUM SUCC 125 MG IJ SOLR
60.0000 mg | Freq: Two times a day (BID) | INTRAMUSCULAR | Status: DC
  Administered 2018-10-08 (×2): 60 mg via INTRAVENOUS
  Filled 2018-10-08 (×3): qty 2

## 2018-10-08 MED ORDER — SODIUM CHLORIDE 0.9% FLUSH
3.0000 mL | Freq: Two times a day (BID) | INTRAVENOUS | Status: DC
  Administered 2018-10-08 – 2018-10-09 (×3): 3 mL via INTRAVENOUS

## 2018-10-08 MED ORDER — LORAZEPAM 0.5 MG PO TABS
0.5000 mg | ORAL_TABLET | Freq: Once | ORAL | Status: AC
  Administered 2018-10-08: 0.5 mg via ORAL
  Filled 2018-10-08: qty 1

## 2018-10-08 MED ORDER — IOHEXOL 350 MG/ML SOLN
75.0000 mL | Freq: Once | INTRAVENOUS | Status: AC | PRN
  Administered 2018-10-08: 75 mL via INTRAVENOUS

## 2018-10-08 MED ORDER — SODIUM CHLORIDE 0.9 % WEIGHT BASED INFUSION
1.0000 mL/kg/h | INTRAVENOUS | Status: DC
  Administered 2018-10-08: 1 mL/kg/h via INTRAVENOUS

## 2018-10-08 MED ORDER — IPRATROPIUM-ALBUTEROL 0.5-2.5 (3) MG/3ML IN SOLN
RESPIRATORY_TRACT | Status: AC
  Administered 2018-10-08: 3 mL via RESPIRATORY_TRACT
  Filled 2018-10-08: qty 3

## 2018-10-08 MED ORDER — NITROGLYCERIN 0.4 MG SL SUBL: 0.4000 mg | SUBLINGUAL_TABLET | SUBLINGUAL | Status: DC | PRN

## 2018-10-08 MED ORDER — HEPARIN BOLUS VIA INFUSION
2600.0000 [IU] | Freq: Once | INTRAVENOUS | Status: AC
  Administered 2018-10-08: 2600 [IU] via INTRAVENOUS
  Filled 2018-10-08: qty 2600

## 2018-10-08 MED ORDER — IOPAMIDOL (ISOVUE-300) INJECTION 61%
INTRAVENOUS | Status: DC | PRN
  Administered 2018-10-08: 140 mL via INTRA_ARTERIAL

## 2018-10-08 MED ORDER — BUTALBITAL-APAP-CAFFEINE 50-325-40 MG PO TABS
1.0000 | ORAL_TABLET | Freq: Four times a day (QID) | ORAL | Status: DC | PRN
  Filled 2018-10-08: qty 1

## 2018-10-08 MED ORDER — AMIODARONE HCL IN DEXTROSE 360-4.14 MG/200ML-% IV SOLN: 30.0000 mg/h | INTRAVENOUS | Status: DC

## 2018-10-08 MED ORDER — AMIODARONE LOAD VIA INFUSION: 150.0000 mg | Freq: Once | INTRAVENOUS | Status: DC

## 2018-10-08 MED ORDER — METHYLPREDNISOLONE SODIUM SUCC 125 MG IJ SOLR
60.0000 mg | Freq: Once | INTRAMUSCULAR | Status: AC
  Administered 2018-10-08: 60 mg via INTRAVENOUS
  Filled 2018-10-08: qty 2

## 2018-10-08 MED ORDER — HEPARIN (PORCINE) 25000 UT/250ML-% IV SOLN
700.0000 [IU]/h | INTRAVENOUS | Status: DC
  Administered 2018-10-08: 700 [IU]/h via INTRAVENOUS
  Filled 2018-10-08: qty 250

## 2018-10-08 MED ORDER — SODIUM CHLORIDE 0.9 % WEIGHT BASED INFUSION: 3.0000 mL/kg/h | INTRAVENOUS | Status: DC

## 2018-10-08 MED ORDER — BUDESONIDE 0.25 MG/2ML IN SUSP
0.2500 mg | Freq: Two times a day (BID) | RESPIRATORY_TRACT | Status: DC
  Administered 2018-10-08 – 2018-10-09 (×3): 0.25 mg via RESPIRATORY_TRACT
  Filled 2018-10-08 (×3): qty 2

## 2018-10-08 MED ORDER — SODIUM CHLORIDE 0.9% FLUSH: 3.0000 mL | INTRAVENOUS | Status: DC | PRN

## 2018-10-08 MED ORDER — ATENOLOL 25 MG PO TABS: 25.0000 mg | ORAL_TABLET | Freq: Every day | ORAL | Status: DC

## 2018-10-08 MED ORDER — SODIUM CHLORIDE 0.9 % IV SOLN
100.0000 mg | Freq: Two times a day (BID) | INTRAVENOUS | Status: DC
  Administered 2018-10-08 (×2): 100 mg via INTRAVENOUS
  Filled 2018-10-08 (×4): qty 100

## 2018-10-08 MED ORDER — ENSURE ENLIVE PO LIQD
237.0000 mL | Freq: Three times a day (TID) | ORAL | Status: DC
  Administered 2018-10-09 (×2): 237 mL via ORAL

## 2018-10-08 MED ORDER — METOPROLOL TARTRATE 25 MG PO TABS
25.0000 mg | ORAL_TABLET | Freq: Two times a day (BID) | ORAL | Status: DC
  Administered 2018-10-09: 25 mg via ORAL
  Filled 2018-10-08 (×2): qty 1

## 2018-10-08 MED ORDER — IPRATROPIUM-ALBUTEROL 0.5-2.5 (3) MG/3ML IN SOLN
3.0000 mL | Freq: Four times a day (QID) | RESPIRATORY_TRACT | Status: DC
  Administered 2018-10-08 – 2018-10-09 (×6): 3 mL via RESPIRATORY_TRACT
  Filled 2018-10-08 (×6): qty 3

## 2018-10-08 MED ORDER — HYDROXYCHLOROQUINE SULFATE 200 MG PO TABS
400.0000 mg | ORAL_TABLET | Freq: Every day | ORAL | Status: DC
  Administered 2018-10-08 – 2018-10-09 (×2): 400 mg via ORAL
  Filled 2018-10-08 (×2): qty 2

## 2018-10-08 MED ORDER — PANTOPRAZOLE SODIUM 40 MG IV SOLR
40.0000 mg | Freq: Every day | INTRAVENOUS | Status: DC
  Administered 2018-10-08 – 2018-10-09 (×2): 40 mg via INTRAVENOUS
  Filled 2018-10-08 (×2): qty 40

## 2018-10-08 MED ORDER — LOSARTAN POTASSIUM 50 MG PO TABS
50.0000 mg | ORAL_TABLET | Freq: Every day | ORAL | Status: DC
  Administered 2018-10-08: 50 mg via ORAL
  Filled 2018-10-08: qty 1
  Filled 2018-10-08: qty 2

## 2018-10-08 MED ORDER — MOMETASONE FURO-FORMOTEROL FUM 200-5 MCG/ACT IN AERO
2.0000 | INHALATION_SPRAY | Freq: Two times a day (BID) | RESPIRATORY_TRACT | Status: DC
  Administered 2018-10-08 – 2018-10-09 (×3): 2 via RESPIRATORY_TRACT
  Filled 2018-10-08: qty 8.8

## 2018-10-08 MED ORDER — ASPIRIN 81 MG PO CHEW: 81.0000 mg | CHEWABLE_TABLET | ORAL | Status: DC

## 2018-10-08 MED ORDER — SENNOSIDES-DOCUSATE SODIUM 8.6-50 MG PO TABS: 1.0000 | ORAL_TABLET | Freq: Every evening | ORAL | Status: DC | PRN

## 2018-10-08 MED ORDER — METHYLPREDNISOLONE SODIUM SUCC 125 MG IJ SOLR: 60.0000 mg | Freq: Two times a day (BID) | INTRAMUSCULAR | Status: DC

## 2018-10-08 MED ORDER — HYDROCODONE-ACETAMINOPHEN 7.5-325 MG PO TABS
1.0000 | ORAL_TABLET | Freq: Three times a day (TID) | ORAL | Status: DC | PRN
  Administered 2018-10-08 – 2018-10-09 (×4): 1 via ORAL
  Filled 2018-10-08 (×4): qty 1

## 2018-10-08 MED ORDER — DILTIAZEM HCL 25 MG/5ML IV SOLN
INTRAVENOUS | Status: AC
  Administered 2018-10-08: 5 mg via INTRAVENOUS
  Filled 2018-10-08: qty 5

## 2018-10-08 SURGICAL SUPPLY — 8 items
CATH INFINITI 5FR ANG PIGTAIL (CATHETERS) ×3 IMPLANT
CATH INFINITI 5FR JL4 (CATHETERS) ×3 IMPLANT
CATH INFINITI JR4 5F (CATHETERS) ×3 IMPLANT
KIT MANI 3VAL PERCEP (MISCELLANEOUS) ×3 IMPLANT
NEEDLE PERC 18GX7CM (NEEDLE) ×3 IMPLANT
PACK CARDIAC CATH (CUSTOM PROCEDURE TRAY) ×3 IMPLANT
SHEATH AVANTI 5FR X 11CM (SHEATH) ×6 IMPLANT
WIRE GUIDERIGHT .035X150 (WIRE) ×3 IMPLANT

## 2018-10-08 NOTE — OR Nursing (Signed)
Pt declined avialable Norco (5/325) would rather wait until 6 pm when gets back to unit to get 7.5/325 as ordered. Repositioned and warm blanket applied to lower back

## 2018-10-08 NOTE — Progress Notes (Signed)
ANTICOAGULATION CONSULT NOTE - Initial Consult  Pharmacy Consult for heparin Indication: chest pain/ACS  Allergies  Allergen Reactions  . Ace Inhibitors     Other reaction(s): Cough  . Alendronate     Other reaction(s): Other (See Comments) GI UPSET  . Coreg [Carvedilol] Other (See Comments)    syncope  . Duloxetine Other (See Comments)  . Morphine Other (See Comments)    confusion  . Pregabalin     Other reaction(s): Other (See Comments) intolerant  . Statins Other (See Comments)  . Verapamil     Other reaction(s): Other (See Comments) CONSTIPATION    Patient Measurements: Height: 5\' 4"  (162.6 cm) Weight: 98 lb 15.8 oz (44.9 kg) IBW/kg (Calculated) : 54.7 Heparin Dosing Weight: 44.6 kg  Vital Signs: Temp: 98.4 F (36.9 C) (11/21 0800) Temp Source: Oral (11/21 0800) BP: 120/59 (11/21 1100) Pulse Rate: 78 (11/21 1100)  Labs: Recent Labs    10/08/18 0124 10/08/18 0538 10/08/18 0838 10/08/18 1107  HGB 10.6* 9.2*  --   --   HCT 34.3* 29.9*  --   --   PLT 312 253  --   --   APTT 30  --   --   --   LABPROT 12.1  --   --   --   INR 0.90  --   --   --   HEPARINUNFRC  --   --   --  0.35  CREATININE 0.87  --   --   --   TROPONINI 0.74* 3.92* 7.06*  --     Estimated Creatinine Clearance: 39 mL/min (by C-G formula based on SCr of 0.87 mg/dL).   Medical History: Past Medical History:  Diagnosis Date  . Allergic rhinitis   . Anxiety   . Asthma   . B12 deficiency   . Breast cancer ([place]) 1999   RT LUMPECTOMY  . Carcinoma of right breast treated with adjuvant chemotherapy ([place]) 1999   RT LUMPECTOMY  . CHF (congestive heart failure) ([place])   . Colon cancer ([place]) 2010  . Colon cancer ([place])   . COPD (chronic obstructive pulmonary disease) ([place])   . Hyperlipemia   . Hypertension   . MRSA pneumonia ([place])   . Osteoporosis   . Radiation 1999   BREAST CA  . 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 aspirin  81 mg Oral Daily  . [START ON  10/09/2018] aspirin  81 mg Oral Pre-Cath  . [START ON 10/09/2018] atenolol  25 mg Oral Daily  . budesonide (PULMICORT) nebulizer solution  0.25 mg Nebulization BID  . cholecalciferol  1,000 Units Oral Daily  . diltiazem  10 mg Intravenous Once  . escitalopram  5 mg Oral Daily  . feeding supplement (ENSURE ENLIVE)  237 mL Oral TID BM  . hydroxychloroquine  400 mg Oral Daily  . ipratropium-albuterol  3 mL Nebulization Q6H  . losartan  50 mg Oral Daily  . [START ON 10/10/2018] predniSONE  40 mg Oral Q breakfast   Followed by  . methylPREDNISolone (SOLU-MEDROL) injection  60 mg Intravenous Q12H  . mometasone-formoterol  2 puff Inhalation BID  . pantoprazole (PROTONIX) IV  40 mg Intravenous Daily  . sodium chloride flush  3 mL Intravenous Q12H    Assessment: Patient admitted for CP and was in afib w/ RVR, EKG also showing some ST depression. Initial trops 0.74. Patient not on PTA anticoagulation. Patient is being started on heparin drip for NSTEMI/afib  Goal  of Therapy:  Heparin level 0.3-0.7 units/ml Monitor platelets by anticoagulation protocol: Yes   Plan:  11/21 @ 1107 HL 0.35. Level is therapeutic x1. Will continue current infusion rate of 700 units/hr and recheck confirmatory level in 8 hours.    Will monitor daily CBC's and adjust per anti-Xa levels.  Pernell Dupre, PharmD, BCPS Clinical Pharmacist 10/08/2018 12:41 PM

## 2018-10-08 NOTE — ED Triage Notes (Signed)
Per EMS pt started with chest pain around 11pm. On arrival pt in afib with RVR. Pt given 5mg  Metoprolol. Pt states pain in central chest and radiates to neck. Pt states she is allergic to Morphine

## 2018-10-08 NOTE — Progress Notes (Signed)
Telephone report called to [facility] in specials recovery.  Patient to be transported for cardiac cath.

## 2018-10-08 NOTE — Progress Notes (Signed)
Telephone report called to Lyndee Leo, Therapist, sports.  Patient taken to room 242 via bed by [facility], CNA,

## 2018-10-08 NOTE — Progress Notes (Signed)
*  PRELIMINARY RESULTS* Echocardiogram 2D Echocardiogram has been performed.  Sherrie Sport 10/08/2018, 1:42 PM

## 2018-10-08 NOTE — Consult Note (Signed)
[facility] Consultation Note  Patient ID: Robin Hudson, MRN: [id number], DOB/AGE: February 10, 1941 77 y.o. Admit date: 10/08/2018   Date of Consult: 10/08/2018 Primary Physician: Adin Hector, MD Primary Cardiologist: Ubaldo Glassing  Chief Complaint: No chief complaint on file.  Reason for Consult: Chest pain  HPI: 77 y.o. female with known essential hypertension mixed hyperlipidemia and apparent coronary atherosclerosis by previous cardiac catheterization in the remote past.  The patient has had new onset substernal chest pressure and pain radiating into her back and causing her to feel weak and fatigued and short of breath.  This happened for several hours and was seen in the emergency room.  At that time EKG showed atrial fibrillation with rapid ventricular rate and diffuse ST depression.  This improved significantly with appropriate intravenous medication management and the patient has had full resolution with an EKG now showing normal sinus rhythm.  The patient was placed on heparin for elevated troponin of 3.32 consistent with non-ST elevation myocardial infarction.  Additionally she has had normal oxygenation at this time and normal hemodynamics  Past Medical History:  Diagnosis Date  . Allergic rhinitis   . Anxiety   . Asthma   . B12 deficiency   . Breast cancer ([place]) 1999   RT LUMPECTOMY  . Carcinoma of right breast treated with adjuvant chemotherapy ([place]) 1999   RT LUMPECTOMY  . CHF (congestive heart failure) ([place])   . Colon cancer ([place]) 2010  . Colon cancer ([place])   . COPD (chronic obstructive pulmonary disease) ([place])   . Hyperlipemia   . Hypertension   . MRSA pneumonia ([place])   . Osteoporosis   . Radiation 1999   BREAST CA  . Right adrenal mass [facility])       Surgical History:  Past Surgical History:  Procedure Laterality Date  . ABDOMINAL HYSTERECTOMY    . APPENDECTOMY    . BREAST SURGERY Right    lumpectomy x2 with lymph nodes  . CARPAL TUNNEL RELEASE Left  06/04/2016   Procedure: CARPAL TUNNEL RELEASE;  Surgeon: Hessie Knows, MD;  Location: [facility] ORS;  Service: Orthopedics;  Laterality: Left;  . colectomy     partial  . IMAGE GUIDED SINUS SURGERY    . OPEN REDUCTION INTERNAL FIXATION (ORIF) DISTAL RADIAL FRACTURE Left 06/04/2016   Procedure: OPEN REDUCTION INTERNAL FIXATION (ORIF) DISTAL RADIAL FRACTURE;  Surgeon: Hessie Knows, MD;  Location: [facility] ORS;  Service: Orthopedics;  Laterality: Left;     Home Meds: Prior to Admission medications   Medication Sig Start Date End Date Taking? Authorizing Provider  albuterol (PROAIR HFA) 108 (90 Base) MCG/ACT inhaler Inhale 2 puffs into the lungs every 6 (six) hours as needed for wheezing or shortness of breath.  10/31/17   [provider]  albuterol (PROVENTIL) (2.5 MG/3ML) 0.083% nebulizer solution Take 2.5 mg by nebulization every 6 (six) hours as needed for wheezing or shortness of breath.    [provider]  ALPRAZolam Duanne Moron) 0.5 MG tablet Take 1 tablet by mouth 2 (two) times daily as needed for anxiety.  04/19/15   [provider]  aspirin EC 81 MG tablet Take 1 tablet (81 mg total) by mouth daily. 06/28/16   Fritzi Mandes, MD  atenolol (TENORMIN) 50 MG tablet Take 1 tablet (50 mg total) by mouth daily. Patient taking differently: Take 25 mg by mouth daily.  05/18/15   Tama High III, MD  azelastine (ASTELIN) 0.1 % nasal spray Place 1 spray into both nostrils  2 (two) times daily. Use in each nostril as directed    [provider]  butalbital-acetaminophen-caffeine (FIORICET, ESGIC) 50-325-40 MG tablet Take 1 tablet by mouth every 6 (six) hours as needed.  12/19/17   [provider]  cholecalciferol (VITAMIN D) 1000 units tablet Take 1,000 Units by mouth daily.    [provider]  escitalopram (LEXAPRO) 5 MG tablet Take 5 mg by mouth daily.    [provider]  Fluticasone-Salmeterol (ADVAIR DISKUS) 250-50 MCG/DOSE AEPB Inhale 1 puff into the lungs  2 (two) times daily.  12/22/17   [provider]  HYDROcodone-acetaminophen (NORCO) 7.5-325 MG tablet Take 1 tablet by mouth 3 (three) times daily as needed for moderate pain.    [provider]  hydroxychloroquine (PLAQUENIL) 200 MG tablet Take 400 mg by mouth daily.    [provider]  levofloxacin (LEVAQUIN) 250 MG tablet Take 1 tablet (250 mg total) by mouth daily. 09/26/18   Epifanio Lesches, MD  lidocaine (LIDODERM) 5 % Place 1 patch onto the skin daily. Remove & Discard patch within 12 hours or as directed by MD 09/26/18   Epifanio Lesches, MD  losartan (COZAAR) 50 MG tablet Take 1 tablet (50 mg total) by mouth daily. 05/18/15   Tama High III, MD  pantoprazole (PROTONIX) 20 MG tablet Take 1 tablet (20 mg total) 2 (two) times daily by mouth. 09/23/17 03/04/18  Merlyn Lot, MD  predniSONE (STERAPRED UNI-PAK 21 TAB) 10 MG (21) TBPK tablet Taper by 10 mg daily 09/26/18   Epifanio Lesches, MD    Inpatient Medications:  . aspirin  81 mg Oral Daily  . [START ON 10/09/2018] atenolol  25 mg Oral Daily  . budesonide (PULMICORT) nebulizer solution  0.25 mg Nebulization BID  . cholecalciferol  1,000 Units Oral Daily  . diltiazem  10 mg Intravenous Once  . escitalopram  5 mg Oral Daily  . hydroxychloroquine  400 mg Oral Daily  . ipratropium-albuterol  3 mL Nebulization Q6H  . losartan  50 mg Oral Daily  . [START ON 10/10/2018] predniSONE  40 mg Oral Q breakfast   Followed by  . methylPREDNISolone (SOLU-MEDROL) injection  60 mg Intravenous Q12H  . mometasone-formoterol  2 puff Inhalation BID   . azithromycin Stopped (10/08/18 0510)  . heparin 700 Units/hr (10/08/18 8676)    Allergies:  Allergies  Allergen Reactions  . Ace Inhibitors     Other reaction(s): Cough  . Alendronate     Other reaction(s): Other (See Comments) GI UPSET  . Coreg [Carvedilol] Other (See Comments)    syncope  . Duloxetine Other (See Comments)  . Morphine Other (See  Comments)    confusion  . Pregabalin     Other reaction(s): Other (See Comments) intolerant  . Statins Other (See Comments)  . Verapamil     Other reaction(s): Other (See Comments) CONSTIPATION    Social History   Socioeconomic History  . Marital status: Married    Spouse name: Not on file  . Number of children: Not on file  . Years of education: Not on file  . Highest education level: Not on file  Occupational History  . Not on file  Social Needs  . Financial resource strain: Not very hard  . Food insecurity:    Worry: Never true    Inability: Never true  . Transportation needs:    Medical: No    Non-medical: No  Tobacco Use  . Smoking status: Current Some Day Smoker  Packs/day: 0.25    Types: Cigarettes  . Smokeless tobacco: Never Used  Substance and Sexual Activity  . Alcohol use: No  . Drug use: No  . Sexual activity: Not on file  Lifestyle  . Physical activity:    Days per week: 0 days    Minutes per session: 0 min  . Stress: To some extent  Relationships  . Social connections:    Talks on phone: Not on file    Gets together: Not on file    Attends religious service: Not on file    Active member of club or organization: Not on file    Attends meetings of clubs or organizations: Not on file    Relationship status: Not on file  . Intimate partner violence:    Fear of current or ex partner: Not on file    Emotionally abused: Not on file    Physically abused: Not on file    Forced sexual activity: Not on file  Other Topics Concern  . Not on file  Social History Narrative  . Not on file     Family History  Problem Relation Age of Onset  . Heart attack Mother   . Breast cancer Other   . Breast cancer Sister 62  . Breast cancer Sister 30     Review of Systems Positive for chest pain shortness of breath palpitations Negative for: General:  chills, fever, night sweats or weight changes.  Cardiovascular: PND orthopnea syncope dizziness   Dermatological skin lesions rashes Respiratory: Cough congestion Urologic: Frequent urination urination at night and hematuria Abdominal: negative for nausea, vomiting, diarrhea, bright red blood per rectum, melena, or hematemesis Neurologic: negative for visual changes, and/or hearing changes  All other systems reviewed and are otherwise negative except as noted above.  Labs: Recent Labs    10/08/18 0124 10/08/18 0538  TROPONINI 0.74* 3.92*   Lab Results  Component Value Date   WBC 9.7 10/08/2018   HGB 9.2 (L) 10/08/2018   HCT 29.9 (L) 10/08/2018   MCV 92.3 10/08/2018   PLT 253 10/08/2018    Recent Labs  Lab 10/08/18 0124  NA 142  K 4.1  CL 102  CO2 34*  BUN 19  CREATININE 0.87  CALCIUM 8.3*  PROT 6.0*  BILITOT 0.3  ALKPHOS 53  ALT 18  AST 29  GLUCOSE 117*   No results found for: CHOL, HDL, LDLCALC, TRIG No results found for: DDIMER  Radiology/Studies:  Dg Chest 2 View  Result Date: 09/23/2018 CLINICAL DATA:  Dyspnea and cough EXAM: CHEST - 2 VIEW COMPARISON:  09/13/2018 FINDINGS: COPD with hyperinflation. Severe apical scarring right greater than left is unchanged. No superimposed edema or pneumonia. No change from the recent study. IMPRESSION: Severe chronic lung disease without interval change. Electronically Signed   By: Franchot Gallo M.D.   On: 09/23/2018 18:40   Ct Angio Chest Pe W And/or Wo Contrast  Result Date: 10/08/2018 CLINICAL DATA:  77 y/o  F; EXAM: CT ANGIOGRAPHY CHEST WITH CONTRAST TECHNIQUE: Multidetector CT imaging of the chest was performed using the standard protocol during bolus administration of intravenous contrast. Multiplanar CT image reconstructions and MIPs were obtained to evaluate the vascular anatomy. CONTRAST:  33mL OMNIPAQUE IOHEXOL 350 MG/ML SOLN COMPARISON:  08/08/2018 CT angiogram of the chest. 07/17/2017 PET-CT. FINDINGS: Cardiovascular: Stable occlusion of the right upper lobe apical segment pulmonary artery (series 7, image  44). Satisfactory opacification of the pulmonary arteries to the segmental level. No evidence of acute  pulmonary embolism. Stable mild cardiomegaly. Stable coronary and aortic calcific atherosclerosis. Normal caliber thoracic aorta. No pericardial effusion. Mediastinum/Nodes: No enlarged mediastinal, hilar, or axillary lymph nodes. Thyroid gland, trachea, and esophagus demonstrate no significant findings. Lungs/Pleura: Stable left lower lobe superior segment 1.2 x 1.6 cm nodule (series 6, image 25). Increased size of peripheral consolidation within the right lower lobe measuring 21 x 18 mm (series 6, image 39). Interval development of multiple small nodular consolidations at the periphery of lower lobes bilaterally. Stable bronchiectasis and near complete collapse of the right upper lobe with dense consolidation. Stable left apex pleuroparenchymal consolidation. Stable moderate to severe emphysema the lungs. Upper Abdomen: No acute abnormality. Musculoskeletal: No chest wall abnormality. No acute or significant osseous findings. Review of the MIP images confirms the above findings. IMPRESSION: 1. No evidence of acute pulmonary embolism. 2. Interval development of multiple new nodular opacities at the periphery of the lower lobes likely representing pneumonia. 3. 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 4. Increased size of peripheral nodule in the right lower lobe, stable left lower lobe superior segment nodule. 5. Aortic Atherosclerosis (ICD10-I70.0) and Emphysema (ICD10-J43.9). Coronary artery calcific atherosclerosis. Electronically Signed   By: Kristine Garbe M.D.   On: 10/08/2018 02:34   Dg Chest Port 1 View  Result Date: 10/08/2018 CLINICAL DATA:  Atrial fibrillation.  Chest pain. EXAM: PORTABLE CHEST 1 VIEW COMPARISON:  September 23, 2018 FINDINGS: Pleuroparenchymal changes in the right apex.  Mild pulmonary venous congestion without overt edema. The cardiomediastinal silhouette is stable. No suspicious infiltrate. IMPRESSION: Mild pulmonary venous congestion. Chronic changes in the right apex. No other acute abnormalities. Electronically Signed   By: Dorise Bullion III M.D   On: 10/08/2018 01:54   Dg Chest Port 1 View  Result Date: 09/13/2018 CLINICAL DATA:  Shortness of breath EXAM: PORTABLE CHEST 1 VIEW COMPARISON:  Chest radiograph and chest CT August 08, 2018 FINDINGS: There is scarring with volume loss/asymmetric pleural thickening in the right apex. A lesser degree of scarring is noted in the left apex. There is also scarring in the right base. There is no frank edema or consolidation. The heart size is normal. The pulmonary vascularity is stable with a degree of distortion due to scarring. No adenopathy evident. There are surgical clips in the right axilla with evidence of prior mastectomy on the right. There is aortic atherosclerosis. No bone lesions. IMPRESSION: Areas of scarring in the upper lobes, more on the right than on the left. There is asymmetric apical pleural thickening on the right. No edema or consolidation. Stable cardiac silhouette. There is aortic atherosclerosis. Aortic Atherosclerosis (ICD10-I70.0). Electronically Signed   By: Lowella Grip III M.D.   On: 09/13/2018 02:18    EKG: Atrial fibrillation with rapid ventricular rate and nonspecific ST changes  Weights: Filed Weights   10/08/18 0127 10/08/18 0427  Weight: 44.6 kg 43.3 kg     Physical Exam: Blood pressure (!) 117/58, pulse 69, temperature 97.6 F (36.4 C), temperature source Oral, resp. rate 16, height 5\' 4"  (1.626 m), weight 43.3 kg, SpO2 99 %. Body mass index is 16.39 kg/m. General: Well developed, well nourished, in no acute distress. Head eyes ears nose throat: Normocephalic, atraumatic, sclera non-icteric, no xanthomas, nares are without discharge. No apparent thyromegaly and/or  mass  Lungs: Normal respiratory effort.  Few wheezes, no rales, no rhonchi.  Heart: RRR with normal S1 S2. no murmur gallop,  no rub, PMI is normal size and placement, carotid upstroke normal without bruit, jugular venous pressure is normal Abdomen: Soft, non-tender, non-distended with normoactive bowel sounds. No hepatomegaly. No rebound/guarding. No obvious abdominal masses. Abdominal aorta is normal size without bruit Extremities: No edema. no cyanosis, no clubbing, no ulcers  Peripheral : 2+ bilateral upper extremity pulses, 2+ bilateral femoral pulses, 2+ bilateral dorsal pedal pulse Neuro: Alert and oriented. No facial asymmetry. No focal deficit. Moves all extremities spontaneously. Musculoskeletal: Normal muscle tone with  kyphosis Psych:  Responds to questions appropriately with a normal affect.    Assessment: Hypertension hyperlipidemia coronary artery disease with a non-ST elevation myocardial infarction and atrial fibrillation with rapid ventricular rate now in normal sinus rhythm  Plan: 1.  Continue beta-blocker for heart rate control and maintenance of normal sinus rhythm 2.  Continue heparin for further risk reduction myocardial infarction as well as risk reduction for stroke with atrial fibrillation 3.  Continue medication management for hypertension control 4.  Echocardiogram for further risk reduction cardiovascular event and assessment for event of myocardial infarction 5.  Proceed to cardiac catheterization to assess coronary anatomy and further treatment thereof is necessary.  Patient understands risk and benefits of cardiac catheterization.  This includes a possibility of death stroke heart attack infection bleeding blood clot.  Patient is at low risk for conscious sedation  Signed, Corey Skains M.D. [facility] 10/08/2018, 8:53 AM

## 2018-10-08 NOTE — Progress Notes (Signed)
Patient transported via bed by Pam, orderly to specials procedure for cardiac cath.

## 2018-10-08 NOTE — Progress Notes (Signed)
Troponin result of 7.06 called to Dr. Nehemiah Massed.  Information acknowledged to order given.

## 2018-10-08 NOTE — H&P (Signed)
[facility] NAME: Robin Hudson    MR#:  [id number]  DATE OF BIRTH:  05-30-1941  DATE OF ADMISSION:  10/08/2018  PRIMARY CARE PHYSICIAN: Adin Hector, MD   REQUESTING/REFERRING PHYSICIAN: Gregor Hams, MD  CHIEF COMPLAINT:  No chief complaint on file.   HISTORY OF PRESENT ILLNESS:  Robin Hudson  is a 77 y.o. female with a known history of COPD (2L [address] qHS + PRN), Hx Br Ca + colon Ca, adrenal mass, pulmonary nodules, now p/w CP. AAOx3, good historian. States she sees Dr. Ubaldo Glassing of Cardiology annually, and sees Dr. Raul Del of Pulmonology regularly (next appointment in early December). She was admitted from 09/21-09/23, 10/27-10/28, 11/06-11/09, and will now be admitted for the fourth time in approximately three months. I have told her this is a bad sign. She acknowledges the significance of this.  Pt states she was "feeling crappy" all day Wednesday (11/20). She states she was feeling "run down" and was generally "uncomfortable". She says she went to lie down @~2230PM, but continued to feel uncomfortable and restless. She got up, and developed sharp severe upper chest pain, constant, non-radiating, non-reproducible, non-positional, non-pleuritic. Non-exertional. This only improved after arriving to ED and receiving medications. She denies palpitations, but endorses associated SOB, diaphoresis and nausea. She denies vomiting or LH/LOC. She called EMS. In ED, pt found to have narrow-complex tachycardia, HR 130s-140s, in acute hypoxemic respiratory failure. SpO2 improved w/ nebs + steroids. Not on BiPAP; tachypneic but not in acute distress at the time of my assessment. She tells me two of her sisters have a Hx of Afib, and one of her sisters is a cardiac nurse.  PAST MEDICAL HISTORY:   Past Medical History:  Diagnosis Date  . Allergic rhinitis   . Anxiety   . Asthma   . B12 deficiency   . Breast cancer ([place]) 1999   RT LUMPECTOMY    . Carcinoma of right breast treated with adjuvant chemotherapy ([place]) 1999   RT LUMPECTOMY  . CHF (congestive heart failure) ([place])   . Colon cancer ([place]) 2010  . Colon cancer ([place])   . COPD (chronic obstructive pulmonary disease) ([place])   . Hyperlipemia   . Hypertension   . MRSA pneumonia ([place])   . Osteoporosis   . Radiation 1999   BREAST CA  . Right adrenal mass ([place])     PAST SURGICAL HISTORY:   Past Surgical History:  Procedure Laterality Date  . ABDOMINAL HYSTERECTOMY    . APPENDECTOMY    . BREAST SURGERY Right    lumpectomy x2 with lymph nodes  . CARPAL TUNNEL RELEASE Left 06/04/2016   Procedure: CARPAL TUNNEL RELEASE;  Surgeon: Hessie Knows, MD;  Location: [facility] ORS;  Service: Orthopedics;  Laterality: Left;  . colectomy     partial  . IMAGE GUIDED SINUS SURGERY    . OPEN REDUCTION INTERNAL FIXATION (ORIF) DISTAL RADIAL FRACTURE Left 06/04/2016   Procedure: OPEN REDUCTION INTERNAL FIXATION (ORIF) DISTAL RADIAL FRACTURE;  Surgeon: Hessie Knows, MD;  Location: [facility] ORS;  Service: Orthopedics;  Laterality: Left;    SOCIAL HISTORY:   Social History   Tobacco Use  . Smoking status: Current Some Day Smoker    Packs/day: 0.25    Types: Cigarettes  . Smokeless tobacco: Never Used  Substance Use Topics  . Alcohol use: No    FAMILY HISTORY:   Family History  Problem Relation Age of Onset  . Heart attack  Mother   . Breast cancer Other   . Breast cancer Sister 49  . Breast cancer Sister 81    DRUG ALLERGIES:   Allergies  Allergen Reactions  . Ace Inhibitors     Other reaction(s): Cough  . Alendronate     Other reaction(s): Other (See Comments) GI UPSET  . Coreg [Carvedilol] Other (See Comments)    syncope  . Duloxetine Other (See Comments)  . Morphine Other (See Comments)    confusion  . Pregabalin     Other reaction(s): Other (See Comments) intolerant  . Statins Other (See Comments)  . Verapamil     Other reaction(s): Other (See  Comments) CONSTIPATION    REVIEW OF SYSTEMS:   Review of Systems  Constitutional: Positive for diaphoresis and malaise/fatigue. Negative for chills, fever and weight loss.  HENT: Negative for congestion, ear pain, hearing loss, nosebleeds, sinus pain, sore throat and tinnitus.   Eyes: Negative for blurred vision, double vision and photophobia.  Respiratory: Positive for shortness of breath. Negative for cough, hemoptysis, sputum production and wheezing.   Cardiovascular: Positive for chest pain. Negative for palpitations, orthopnea, claudication, leg swelling and PND.  Gastrointestinal: Positive for nausea. Negative for abdominal pain, blood in stool, constipation, diarrhea, heartburn, melena and vomiting.  Genitourinary: Negative for dysuria, frequency, hematuria and urgency.  Musculoskeletal: Negative for back pain, falls, joint pain, myalgias and neck pain.  Skin: Negative for itching and rash.  Neurological: Positive for weakness. Negative for dizziness, tingling, tremors, sensory change, speech change, focal weakness, seizures, loss of consciousness and headaches.  Psychiatric/Behavioral: Negative for memory loss. The patient does not have insomnia.    MEDICATIONS AT HOME:   Prior to Admission medications   Medication Sig Start Date End Date Taking? Authorizing Provider  albuterol (PROAIR HFA) 108 (90 Base) MCG/ACT inhaler Inhale 2 puffs into the lungs every 6 (six) hours as needed for wheezing or shortness of breath.  10/31/17   [provider]  albuterol (PROVENTIL) (2.5 MG/3ML) 0.083% nebulizer solution Take 2.5 mg by nebulization every 6 (six) hours as needed for wheezing or shortness of breath.    [provider]  ALPRAZolam Duanne Moron) 0.5 MG tablet Take 1 tablet by mouth 2 (two) times daily as needed for anxiety.  04/19/15   [provider]  aspirin EC 81 MG tablet Take 1 tablet (81 mg total) by mouth daily. 06/28/16   Fritzi Mandes, MD  atenolol (TENORMIN) 50  MG tablet Take 1 tablet (50 mg total) by mouth daily. Patient taking differently: Take 25 mg by mouth daily.  05/18/15   Tama High III, MD  azelastine (ASTELIN) 0.1 % nasal spray Place 1 spray into both nostrils 2 (two) times daily. Use in each nostril as directed    [provider]  butalbital-acetaminophen-caffeine (FIORICET, ESGIC) 50-325-40 MG tablet Take 1 tablet by mouth every 6 (six) hours as needed.  12/19/17   [provider]  cholecalciferol (VITAMIN D) 1000 units tablet Take 1,000 Units by mouth daily.    [provider]  escitalopram (LEXAPRO) 5 MG tablet Take 5 mg by mouth daily.    [provider]  Fluticasone-Salmeterol (ADVAIR DISKUS) 250-50 MCG/DOSE AEPB Inhale 1 puff into the lungs 2 (two) times daily.  12/22/17   [provider]  HYDROcodone-acetaminophen (NORCO) 7.5-325 MG tablet Take 1 tablet by mouth 3 (three) times daily as needed for moderate pain.    [provider]  hydroxychloroquine (PLAQUENIL) 200 MG tablet Take 400 mg by  mouth daily.    [provider]  levofloxacin (LEVAQUIN) 250 MG tablet Take 1 tablet (250 mg total) by mouth daily. 09/26/18   Epifanio Lesches, MD  lidocaine (LIDODERM) 5 % Place 1 patch onto the skin daily. Remove & Discard patch within 12 hours or as directed by MD 09/26/18   Epifanio Lesches, MD  losartan (COZAAR) 50 MG tablet Take 1 tablet (50 mg total) by mouth daily. 05/18/15   Tama High III, MD  pantoprazole (PROTONIX) 20 MG tablet Take 1 tablet (20 mg total) 2 (two) times daily by mouth. 09/23/17 03/04/18  Merlyn Lot, MD  predniSONE (STERAPRED UNI-PAK 21 TAB) 10 MG (21) TBPK tablet Taper by 10 mg daily 09/26/18   Epifanio Lesches, MD      VITAL SIGNS:  Blood pressure 95/65, pulse 66, temperature 97.7 F (36.5 C), temperature source Oral, resp. rate (!) 25, height 5\' 4"  (1.626 m), weight 44.6 kg, SpO2 (!) 58 %.  PHYSICAL EXAMINATION:  Physical Exam   Constitutional: She is oriented to person, place, and time. She appears well-developed. She appears cachectic. She is active and cooperative.  Non-toxic appearance. She has a sickly appearance. She appears ill. No distress. She is not intubated. Nasal cannula in place.  HENT:  Head: Normocephalic and atraumatic.  Mouth/Throat: Oropharynx is clear and moist. No oropharyngeal exudate.  Eyes: Conjunctivae, EOM and lids are normal. No scleral icterus.  Neck: Neck supple. No JVD present. No thyromegaly present.  Cardiovascular: Normal rate, S1 normal and S2 normal. An irregularly irregular rhythm present.  No extrasystoles are present. Exam reveals no gallop, no S3, no S4, no distant heart sounds and no friction rub.  No murmur heard. Pulmonary/Chest: Effort normal. No accessory muscle usage or stridor. Tachypnea noted. No apnea and no bradypnea. She is not intubated. No respiratory distress. She has decreased breath sounds in the right upper field, the right middle field, the right lower field, the left upper field, the left middle field and the left lower field. She has no wheezes. She has rhonchi in the right upper field, the right middle field, the right lower field, the left upper field, the left middle field and the left lower field. She has no rales. She exhibits no tenderness.  Abdominal: Soft. She exhibits no distension. Bowel sounds are decreased. There is no tenderness. There is no rigidity, no rebound and no guarding.  Musculoskeletal: Normal range of motion. She exhibits no edema or tenderness.  Lymphadenopathy:    She has no cervical adenopathy.  Neurological: She is alert and oriented to person, place, and time. She is not disoriented.  Skin: Skin is warm, dry and intact. No rash noted. She is not diaphoretic. No erythema.  Psychiatric: She has a normal mood and affect. Her speech is normal and behavior is normal. Judgment and thought content normal. Cognition and memory are normal.    Diffusely diminished, diffuse coarse rhonchi. LABORATORY PANEL:   CBC Recent Labs  Lab 10/08/18 0124  WBC 11.8*  HGB 10.6*  HCT 34.3*  PLT 312   ------------------------------------------------------------------------------------------------------------------  Chemistries  Recent Labs  Lab 10/08/18 0124  NA 142  K 4.1  CL 102  CO2 34*  GLUCOSE 117*  BUN 19  CREATININE 0.87  CALCIUM 8.3*   ------------------------------------------------------------------------------------------------------------------  Cardiac Enzymes Recent Labs  Lab 10/08/18 0124  TROPONINI 0.74*   ------------------------------------------------------------------------------------------------------------------  RADIOLOGY:  Dg Chest Port 1 View  Result Date: 10/08/2018 CLINICAL DATA:  Atrial fibrillation.  Chest pain. EXAM: PORTABLE CHEST  1 VIEW COMPARISON:  September 23, 2018 FINDINGS: Pleuroparenchymal changes in the right apex. Mild pulmonary venous congestion without overt edema. The cardiomediastinal silhouette is stable. No suspicious infiltrate. IMPRESSION: Mild pulmonary venous congestion. Chronic changes in the right apex. No other acute abnormalities. Electronically Signed   By: Dorise Bullion III M.D   On: 10/08/2018 01:54   IMPRESSION AND PLAN:   A/P: 75J p/w chest pain, new-onset Afib + RVR (improved), troponin elevation, acute on chronic hypoxemic respiratory failure, acute COPD exacerbation. Dehydration, hyperglycemia, hypocalcemia, mild leukocytosis (WBC < 12.0), mild normocytic anemia. -Chest pain, new-onset-Afib + RVR, troponin elevation: Pt p/w atypical CP, HR 130s-140s on arrival. HR elevated w/ low BP initially during my evaluation, but HR improved to 60s while I was talking to pt. Amiodarone was not started. Goal HR < 110. Continuous cardiac monitoring. Trop-I 0.74, rpt pending. Likely 2/2 demand in setting of tachycardia; however, pt does have risk factors for ASCVD, including  age/gender, tobacco use and chest radiation. CHADS-VASC > 2, heparin gtt. Morphine, NTG, O2 PRN. C/w ASA, beta blocker, ARB as tolerated. Tachyarrhythmia possibly driven by pulmonary disease, dehydration, though further workup is pending. Echo, TSH pending. Cardiology consult appreciated. -Acute on chronic hypoxemic respiratory failure, acute COPD exacerbation: Hypoxic on arrival, improved w/ steroids/nebs. Diminished w/ diffuse coarse rhonchi on exam. CTA chest pending official Radiology read; shows pulmonary nodules (seen on prior scans), as well as RUL lung fibrosis, but does not appear to demonstrate acute PE or infiltrate/consolidation. Steroids (IV, transition to PO), nebs, Azithromycin (indicated in moderate/severe COPD exacerbation even in absence of infxn). Incentive spirometry, pulmonary toileting, O2, LABA/ICS. Scheduled for outpt Pulmonology f/up. -Dehydration: Gentle IVF. -Hypocalcemia: Ionized calcium. -Normocytic anemia: Hgb 10.6, stable from prior admission. Likely 2/2 anemia of chronic disease. No evidence of active/acute blood loss at present time. -c/w home meds/formulary subs. -FEN/GI: Cardiac diet. -DVT PPx: Heparin gtt. -Code status: Full code. -Disposition: Admission, > 2 midnights.   All the records are reviewed and case discussed with ED provider. Management plans discussed with the patient, family and they are in agreement.  CODE STATUS: Full code.  TOTAL TIME TAKING CARE OF THIS PATIENT: 75 minutes.    Arta Silence M.D on 10/08/2018 at 3:12 AM  Between 7am to 6pm - Pager - [phone]  After 6pm go to [email or web address] - password [email or web address] [facility]  [employer]  Hospitalists  Office  [phone]  CC: Primary care physician; Adin Hector, MD   Note: This dictation was prepared with Dragon dictation along with smaller phrase technology. Any transcriptional errors that result from this process are unintentional.

## 2018-10-08 NOTE — ED Provider Notes (Signed)
[facility] Emergency Department Provider Note ______   First MD Initiated Contact with Patient 10/08/18 [phone]     (approximate)  I have reviewed the triage vital signs and the nursing notes.   HISTORY  Chief Complaint No chief complaint on file.    HPI Robin Hudson is a 76 y.o. female with below list of chronic medical conditions including COPD CHF presents to the emergency department via EMS with acute onset this morning of central nonradiating chest pain that is currently 8 out of 10.  Patient also admits to associated dyspnea diaphoresis and nausea.   Past Medical History:  Diagnosis Date  . Allergic rhinitis   . Anxiety   . Asthma   . B12 deficiency   . Breast cancer ([place]) 1999   RT LUMPECTOMY  . Carcinoma of right breast treated with adjuvant chemotherapy ([place]) 1999   RT LUMPECTOMY  . CHF (congestive heart failure) ([place])   . Colon cancer ([place]) 2010  . Colon cancer ([place])   . COPD (chronic obstructive pulmonary disease) ([place])   . Hyperlipemia   . Hypertension   . MRSA pneumonia ([place])   . Osteoporosis   . Radiation 1999   BREAST CA  . Right adrenal mass [facility])     Patient Active Problem List   Diagnosis Date Noted  . Chest pain 10/08/2018  . COPD with acute exacerbation ([place]) 09/23/2018  . Acute respiratory failure with hypoxia ([place]) 09/14/2018  . Acute hypoxemic respiratory failure ([place]) 09/13/2018  . Acute exacerbation of chronic obstructive pulmonary disease (COPD) ([place]) 08/08/2018  . Leukocytosis 12/29/2016  . Cough 12/29/2016  . Hypoxia 12/06/2015  . COPD exacerbation ([place]) 12/03/2015  . Elevated troponin 12/03/2015  . Elevated antinuclear antibody (ANA) level 05/15/2015  . Shortness of breath 05/12/2015  . Hyperlipemia 05/12/2015  . COPD (chronic obstructive pulmonary disease) ([place]) 05/12/2015  . HTN (hypertension) 05/12/2015  . Osteoporosis 05/12/2015  . Right adrenal mass ([place]) 05/12/2015  . B12 deficiency 05/12/2015    . Allergic rhinitis 05/12/2015  . Breast cancer ([place]) 05/12/2015  . Colon cancer ([place]) 05/12/2015  . Closed head injury 12/15/2014    Past Surgical History:  Procedure Laterality Date  . ABDOMINAL HYSTERECTOMY    . APPENDECTOMY    . BREAST SURGERY Right    lumpectomy x2 with lymph nodes  . CARPAL TUNNEL RELEASE Left 06/04/2016   Procedure: CARPAL TUNNEL RELEASE;  Surgeon: Hessie Knows, MD;  Location: [facility] ORS;  Service: Orthopedics;  Laterality: Left;  . colectomy     partial  . IMAGE GUIDED SINUS SURGERY    . OPEN REDUCTION INTERNAL FIXATION (ORIF) DISTAL RADIAL FRACTURE Left 06/04/2016   Procedure: OPEN REDUCTION INTERNAL FIXATION (ORIF) DISTAL RADIAL FRACTURE;  Surgeon: Hessie Knows, MD;  Location: [facility] ORS;  Service: Orthopedics;  Laterality: Left;    Prior to Admission medications   Medication Sig Start Date End Date Taking? Authorizing Provider  albuterol (PROAIR HFA) 108 (90 Base) MCG/ACT inhaler Inhale 2 puffs into the lungs every 6 (six) hours as needed for wheezing or shortness of breath.  10/31/17   [provider]  albuterol (PROVENTIL) (2.5 MG/3ML) 0.083% nebulizer solution Take 2.5 mg by nebulization every 6 (six) hours as needed for wheezing or shortness of breath.    [provider]  ALPRAZolam Duanne Moron) 0.5 MG tablet Take 1 tablet by mouth 2 (two) times daily as needed for anxiety.  04/19/15   [provider]  aspirin EC 81 MG tablet Take  1 tablet (81 mg total) by mouth daily. 06/28/16   Fritzi Mandes, MD  atenolol (TENORMIN) 50 MG tablet Take 1 tablet (50 mg total) by mouth daily. Patient taking differently: Take 25 mg by mouth daily.  05/18/15   Tama High III, MD  azelastine (ASTELIN) 0.1 % nasal spray Place 1 spray into both nostrils 2 (two) times daily. Use in each nostril as directed    [provider]  butalbital-acetaminophen-caffeine (FIORICET, ESGIC) 50-325-40 MG tablet Take 1 tablet by mouth every 6 (six) hours as needed.   12/19/17   [provider]  cholecalciferol (VITAMIN D) 1000 units tablet Take 1,000 Units by mouth daily.    [provider]  escitalopram (LEXAPRO) 5 MG tablet Take 5 mg by mouth daily.    [provider]  Fluticasone-Salmeterol (ADVAIR DISKUS) 250-50 MCG/DOSE AEPB Inhale 1 puff into the lungs 2 (two) times daily.  12/22/17   [provider]  HYDROcodone-acetaminophen (NORCO) 7.5-325 MG tablet Take 1 tablet by mouth 3 (three) times daily as needed for moderate pain.    [provider]  hydroxychloroquine (PLAQUENIL) 200 MG tablet Take 400 mg by mouth daily.    [provider]  levofloxacin (LEVAQUIN) 250 MG tablet Take 1 tablet (250 mg total) by mouth daily. 09/26/18   Epifanio Lesches, MD  lidocaine (LIDODERM) 5 % Place 1 patch onto the skin daily. Remove & Discard patch within 12 hours or as directed by MD 09/26/18   Epifanio Lesches, MD  losartan (COZAAR) 50 MG tablet Take 1 tablet (50 mg total) by mouth daily. 05/18/15   Tama High III, MD  pantoprazole (PROTONIX) 20 MG tablet Take 1 tablet (20 mg total) 2 (two) times daily by mouth. 09/23/17 03/04/18  Merlyn Lot, MD  predniSONE (STERAPRED UNI-PAK 21 TAB) 10 MG (21) TBPK tablet Taper by 10 mg daily 09/26/18   Epifanio Lesches, MD    Allergies Ace inhibitors; Alendronate; Coreg [carvedilol]; Duloxetine; Morphine; Pregabalin; Statins; and Verapamil  Family History  Problem Relation Age of Onset  . Heart attack Mother   . Breast cancer Other   . Breast cancer Sister 54  . Breast cancer Sister 89    Social History Social History   Tobacco Use  . Smoking status: Current Some Day Smoker    Packs/day: 0.25    Types: Cigarettes  . Smokeless tobacco: Never Used  Substance Use Topics  . Alcohol use: No  . Drug use: No    Review of Systems Constitutional: No fever/chills Eyes: No visual changes. ENT: No sore throat. Cardiovascular:: Positive for chest  pain. Respiratory positive for breath. Gastrointestinal: No abdominal pain.  No nausea, no vomiting.  No diarrhea.  No constipation. Genitourinary: Negative for dysuria. Musculoskeletal: Negative for neck pain.  Negative for back pain. Integumentary: Negative for rash. Neurological: Negative for headaches, focal weakness or numbness.   ____________________________________________   PHYSICAL EXAM:  VITAL SIGNS: ED Triage Vitals  Enc Vitals Group     BP 10/08/18 0125 107/73     Pulse Rate 10/08/18 0124 (!) 38     Resp 10/08/18 0124 (!) 41     Temp 10/08/18 0125 97.7 F (36.5 C)     Temp Source 10/08/18 0125 Oral     SpO2 10/08/18 0124 92 %     Weight 10/08/18 0127 44.6 kg (98 lb 5.2 oz)     Height 10/08/18 0127 1.626 m (5\' 4" )     Head Circumference --  Peak Flow --      Pain Score 10/08/18 0127 10     Pain Loc --      Pain Edu? --      Excl. in [place]? --     Constitutional: Alert and oriented.  Apparent respiratory difficulty eyes: Conjunctivae are normal.  Head: Atraumatic. Mouth/Throat: Mucous membranes are moist.  Oropharynx non-erythematous. Neck: No stridor.   Cardiovascular: Tachycardia with a regular rhythm.  Palpable peripheral pulses equal bilaterally. Respiratory: Tachypnea with accessory rest tori muscle use. Diffuse rhonchi. Gastrointestinal: Soft and nontender. No distention.  Musculoskeletal: No lower extremity tenderness nor edema. No gross deformities of extremities. Neurologic:  Normal speech and language. No gross focal neurologic deficits are appreciated.  Skin:  Skin is warm, dry and intact. No rash noted. Psychiatric: Mood and affect are normal. Speech and behavior are normal.  ____________________________________________   LABS (all labs ordered are listed, but only abnormal results are displayed)  Labs Reviewed  BASIC METABOLIC PANEL - Abnormal; Notable for the following components:      Result Value   CO2 34 (*)    Glucose, Bld 117 (*)     Calcium 8.3 (*)    All other components within normal limits  CBC - Abnormal; Notable for the following components:   WBC 11.8 (*)    RBC 3.76 (*)    Hemoglobin 10.6 (*)    HCT 34.3 (*)    All other components within normal limits  TROPONIN I - Abnormal; Notable for the following components:   Troponin I 0.74 (*)    All other components within normal limits  BLOOD GAS, VENOUS - Abnormal; Notable for the following components:   pCO2, Ven 78 (*)    Bicarbonate 38.4 (*)    Acid-Base Excess 9.4 (*)    All other components within normal limits  TROPONIN I - Abnormal; Notable for the following components:   Troponin I 3.92 (*)    All other components within normal limits  CBC - Abnormal; Notable for the following components:   RBC 3.24 (*)    Hemoglobin 9.2 (*)    HCT 29.9 (*)    All other components within normal limits  HEPATIC FUNCTION PANEL - Abnormal; Notable for the following components:   Total Protein 6.0 (*)    Albumin 3.1 (*)    All other components within normal limits  GLUCOSE, CAPILLARY - Abnormal; Notable for the following components:   Glucose-Capillary 102 (*)    All other components within normal limits  MRSA PCR SCREENING  MAGNESIUM  PHOSPHORUS  TSH  PROTIME-INR  APTT  TROPONIN I  URINALYSIS, ROUTINE W REFLEX MICROSCOPIC  HEPARIN LEVEL (UNFRACTIONATED)  CALCIUM, IONIZED  PREALBUMIN   ____________________________________________  EKG ED ECG REPORT I, [place] Aliegha Paullin, the attending physician, personally viewed and interpreted this ECG.   Date: 10/08/2018  EKG Time: 1:23 AM  Rate: 140  Rhythm: Atrial fibrillation with rapid ventricular response  Axis: Leftward axis  Intervals: Irregular RR interval  ST&T Change: Inferior lateral ST segment depression  ____________________________________________  RADIOLOGY I, [place] Hilja Kintzel, personally viewed and evaluated these images (plain radiographs) as part of my medical decision making, as well as  reviewing the written report by the radiologist.  ED MD interpretation: No evidence of pulmonary emboli noted on CT scan of the chest however multiple new nodular opacities in the peripherally of the lower lobes likely representing pneumonia per radiologist  Official radiology report(s): Ct Angio Chest Pe W And/or Wo  Contrast  Result Date: 10/08/2018 CLINICAL DATA:  77 y/o  F; EXAM: CT ANGIOGRAPHY CHEST WITH CONTRAST TECHNIQUE: Multidetector CT imaging of the chest was performed using the standard protocol during bolus administration of intravenous contrast. Multiplanar CT image reconstructions and MIPs were obtained to evaluate the vascular anatomy. CONTRAST:  20mL OMNIPAQUE IOHEXOL 350 MG/ML SOLN COMPARISON:  08/08/2018 CT angiogram of the chest. 07/17/2017 PET-CT. FINDINGS: Cardiovascular: Stable occlusion of the right upper lobe apical segment pulmonary artery (series 7, image 44). Satisfactory opacification of the pulmonary arteries to the segmental level. No evidence of acute pulmonary embolism. Stable mild cardiomegaly. Stable coronary and aortic calcific atherosclerosis. Normal caliber thoracic aorta. No pericardial effusion. Mediastinum/Nodes: No enlarged mediastinal, hilar, or axillary lymph nodes. Thyroid gland, trachea, and esophagus demonstrate no significant findings. Lungs/Pleura: Stable left lower lobe superior segment 1.2 x 1.6 cm nodule (series 6, image 25). Increased size of peripheral consolidation within the right lower lobe measuring 21 x 18 mm (series 6, image 39). Interval development of multiple small nodular consolidations at the periphery of lower lobes bilaterally. Stable bronchiectasis and near complete collapse of the right upper lobe with dense consolidation. Stable left apex pleuroparenchymal consolidation. Stable moderate to severe emphysema the lungs. Upper Abdomen: No acute abnormality. Musculoskeletal: No chest wall abnormality. No acute or significant osseous findings.  Review of the MIP images confirms the above findings. IMPRESSION: 1. No evidence of acute pulmonary embolism. 2. Interval development of multiple new nodular opacities at the periphery of the lower lobes likely representing pneumonia. 3. 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 4. Increased size of peripheral nodule in the right lower lobe, stable left lower lobe superior segment nodule. 5. Aortic Atherosclerosis (ICD10-I70.0) and Emphysema (ICD10-J43.9). Coronary artery calcific atherosclerosis. Electronically Signed   By: Kristine Garbe M.D.   On: 10/08/2018 02:34   Dg Chest Port 1 View  Result Date: 10/08/2018 CLINICAL DATA:  Atrial fibrillation.  Chest pain. EXAM: PORTABLE CHEST 1 VIEW COMPARISON:  September 23, 2018 FINDINGS: Pleuroparenchymal changes in the right apex. Mild pulmonary venous congestion without overt edema. The cardiomediastinal silhouette is stable. No suspicious infiltrate. IMPRESSION: Mild pulmonary venous congestion. Chronic changes in the right apex. No other acute abnormalities. Electronically Signed   By: Dorise Bullion III M.D   On: 10/08/2018 01:54     Critical Care performed:  .Critical Care Performed by: Gregor Hams, MD Authorized by: Gregor Hams, MD   Critical care provider statement:    Critical care time (minutes):  45   Critical care time was exclusive of:  Separately billable procedures and treating other patients and teaching time   Critical care was necessary to treat or prevent imminent or life-threatening deterioration of the following conditions:  Respiratory failure and circulatory failure   Critical care was time spent personally by me on the following activities:  Development of treatment plan with patient or surrogate, discussions with consultants, evaluation of patient's response to treatment, examination of patient, obtaining  history from patient or surrogate, ordering and performing treatments and interventions, ordering and review of laboratory studies, ordering and review of radiographic studies, pulse oximetry, re-evaluation of patient's condition and review of old charts   I assumed direction of critical care for this patient from another provider in my specialty: no       ____________________________________________   INITIAL IMPRESSION / ASSESSMENT AND PLAN / ED COURSE  As part of my  medical decision making, I reviewed the following data within the electronic MEDICAL RECORD NUMBER   77 year old female presenting with above-stated history and physical exam secondary to chest pain and dyspnea.  Patient noted to be in atrial fibrillation with rapid ventricular response as well.  Patient received 2 boluses of diltiazem with rate improvement however currently still in atrial fibrillation with rapid ventricular response and as such discussed diltiazem infusion with Dr. Aliene Altes admitting physician.  In addition patient with apparent respiratory distress and as such was given DuoNeb and IV Solu-Medrol in the emergency department as well as IV antibiotic therapy given concern for possible COPD exacerbation versus pneumonia. Clinical Course as of Oct 08 638  Thu Oct 08, 2018  0227 CBC(!) [RB]    Clinical Course User Index [RB] Gregor Hams, MD    ____________________________________________  FINAL CLINICAL IMPRESSION(S) / ED DIAGNOSES  Final diagnoses:  Chest pain, unspecified type  Atrial fibrillation with rapid ventricular response (HCC)  COPD exacerbation Elevated troponin  MEDICATIONS GIVEN DURING THIS VISIT:  Medications  diltiazem (CARDIZEM) injection 10 mg ( Intravenous Not Given 10/08/18 0305)  aspirin chewable tablet 81 mg (has no administration in time range)  HYDROmorphone (DILAUDID) injection 0.5 mg (has no administration in time range)  nitroGLYCERIN (NITROSTAT) SL tablet 0.4 mg (has no  administration in time range)  ipratropium-albuterol (DUONEB) 0.5-2.5 (3) MG/3ML nebulizer solution 3 mL (has no administration in time range)  ipratropium-albuterol (DUONEB) 0.5-2.5 (3) MG/3ML nebulizer solution 3 mL (has no administration in time range)  azithromycin (ZITHROMAX) 500 mg in sodium chloride 0.9 % 250 mL IVPB (0 mg Intravenous Stopped 10/08/18 0510)  ALPRAZolam (XANAX) tablet 0.5 mg (has no administration in time range)  atenolol (TENORMIN) tablet 25 mg (has no administration in time range)  butalbital-acetaminophen-caffeine (FIORICET, ESGIC) 50-325-40 MG per tablet 1 tablet (has no administration in time range)  cholecalciferol (VITAMIN D3) tablet 1,000 Units (has no administration in time range)  escitalopram (LEXAPRO) tablet 5 mg (has no administration in time range)  mometasone-formoterol (DULERA) 200-5 MCG/ACT inhaler 2 puff (has no administration in time range)  hydroxychloroquine (PLAQUENIL) tablet 400 mg (has no administration in time range)  losartan (COZAAR) tablet 50 mg (has no administration in time range)  acetaminophen (TYLENOL) tablet 650 mg (has no administration in time range)  ondansetron (ZOFRAN) injection 4 mg (has no administration in time range)  senna-docusate (Senokot-S) tablet 1 tablet (has no administration in time range)  bisacodyl (DULCOLAX) EC tablet 5 mg (has no administration in time range)  heparin ADULT infusion 100 units/mL (25000 units/232mL sodium chloride 0.45%) (700 Units/hr Intravenous New Bag/Given 10/08/18 0339)  methylPREDNISolone sodium succinate (SOLU-MEDROL) 125 mg/2 mL injection 60 mg (has no administration in time range)    Followed by  predniSONE (DELTASONE) tablet 40 mg (has no administration in time range)  budesonide (PULMICORT) nebulizer solution 0.25 mg (has no administration in time range)  diltiazem (CARDIZEM) injection 5 mg (5 mg Intravenous Given 10/08/18 0139)  ipratropium-albuterol (DUONEB) 0.5-2.5 (3) MG/3ML nebulizer  solution 3 mL (3 mLs Nebulization Given 10/08/18 0140)  methylPREDNISolone sodium succinate (SOLU-MEDROL) 125 mg/2 mL injection 60 mg (60 mg Intravenous Given 10/08/18 0228)  iohexol (OMNIPAQUE) 350 MG/ML injection 75 mL (75 mLs Intravenous Contrast Given 10/08/18 0157)  aspirin chewable tablet 324 mg (324 mg Oral Given 10/08/18 0232)  sodium chloride 0.9 % bolus 1,000 mL (0 mLs Intravenous Stopped 10/08/18 0336)  LORazepam (ATIVAN) tablet 0.5 mg (0.5 mg Oral Given 10/08/18 0232)  heparin bolus via  infusion 2,600 Units (2,600 Units Intravenous Bolus from Bag 10/08/18 0341)     ED Discharge Orders    None       Note:  This document was prepared using Dragon voice recognition software and may include unintentional dictation errors.    Gregor Hams, MD 10/08/18 [phone]

## 2018-10-08 NOTE — Progress Notes (Signed)
Patient ID: Robin Hudson, female   DOB: 06/04/1941, 77 y.o.   MRN: [id number] [name] NAME: Robin Hudson    MR#:  [id number]  DATE OF BIRTH:  02/14/1941  SUBJECTIVE:  in with chest pain yesterday. Ruling for acute non-QA of MI. Patient waiting for cardiac cath. Denies any chest pain or shortness of breath. He uses chronic oxygen at home  REVIEW OF SYSTEMS:   Review of Systems  Constitutional: Negative for chills, fever and weight loss.  HENT: Negative for ear discharge, ear pain and nosebleeds.   Eyes: Negative for blurred vision, pain and discharge.  Respiratory: Negative for sputum production, shortness of breath, wheezing and stridor.   Cardiovascular: Negative for chest pain, palpitations, orthopnea and PND.  Gastrointestinal: Negative for abdominal pain, diarrhea, nausea and vomiting.  Genitourinary: Negative for frequency and urgency.  Musculoskeletal: Negative for back pain and joint pain.  Neurological: Negative for sensory change, speech change, focal weakness and weakness.  Psychiatric/Behavioral: Negative for depression and hallucinations. The patient is not nervous/anxious.    Tolerating Diet:npo Tolerating PT:   DRUG ALLERGIES:   Allergies  Allergen Reactions  . Ace Inhibitors     Other reaction(s): Cough  . Alendronate     Other reaction(s): Other (See Comments) GI UPSET  . Coreg [Carvedilol] Other (See Comments)    syncope  . Duloxetine Other (See Comments)  . Morphine Other (See Comments)    confusion  . Pregabalin     Other reaction(s): Other (See Comments) intolerant  . Statins Other (See Comments)  . Verapamil     Other reaction(s): Other (See Comments) CONSTIPATION    VITALS:  Blood pressure 107/60, pulse 80, temperature 97.8 F (36.6 C), temperature source Oral, resp. rate (!) 22, height 5\' 4"  (1.626 m), weight 44.9 kg, SpO2 96 %.  PHYSICAL EXAMINATION:   Physical  Exam  GENERAL:  77 y.o.-year-old patient lying in the bed with no acute distress. Thin, cachectic EYES: Pupils equal, round, reactive to light and accommodation. No scleral icterus. Extraocular muscles intact.  HEENT: Head atraumatic, normocephalic. Oropharynx and nasopharynx clear.  NECK:  Supple, no jugular venous distention. No thyroid enlargement, no tenderness.  LUNGS: distant breath sounds bilaterally, no wheezing, rales, rhonchi. No use of accessory muscles of respiration.  CARDIOVASCULAR: S1, S2 normal. No murmurs, rubs, or gallops.  ABDOMEN: Soft, nontender, nondistended. Bowel sounds present. No organomegaly or mass.  EXTREMITIES: No cyanosis, clubbing or edema b/l.    NEUROLOGIC: Cranial nerves II through XII are intact. No focal Motor or sensory deficits b/l.   PSYCHIATRIC:  patient is alert and oriented x 3.  SKIN: No obvious rash, lesion, or ulcer.   LABORATORY PANEL:  CBC Recent Labs  Lab 10/08/18 0538  WBC 9.7  HGB 9.2*  HCT 29.9*  PLT 253    Chemistries  Recent Labs  Lab 10/08/18 0124  NA 142  K 4.1  CL 102  CO2 34*  GLUCOSE 117*  BUN 19  CREATININE 0.87  CALCIUM 8.3*  MG 2.0  AST 29  ALT 18  ALKPHOS 53  BILITOT 0.3   Cardiac Enzymes Recent Labs  Lab 10/08/18 0838  TROPONINI 7.06*   RADIOLOGY:  Ct Angio Chest Pe W And/or Wo Contrast  Result Date: 10/08/2018 CLINICAL DATA:  77 y/o  F; EXAM: CT ANGIOGRAPHY CHEST WITH CONTRAST TECHNIQUE: Multidetector CT imaging of the chest was performed using the standard protocol during bolus administration of intravenous  contrast. Multiplanar CT image reconstructions and MIPs were obtained to evaluate the vascular anatomy. CONTRAST:  60mL OMNIPAQUE IOHEXOL 350 MG/ML SOLN COMPARISON:  08/08/2018 CT angiogram of the chest. 07/17/2017 PET-CT. FINDINGS: Cardiovascular: Stable occlusion of the right upper lobe apical segment pulmonary artery (series 7, image 44). Satisfactory opacification of the pulmonary arteries to  the segmental level. No evidence of acute pulmonary embolism. Stable mild cardiomegaly. Stable coronary and aortic calcific atherosclerosis. Normal caliber thoracic aorta. No pericardial effusion. Mediastinum/Nodes: No enlarged mediastinal, hilar, or axillary lymph nodes. Thyroid gland, trachea, and esophagus demonstrate no significant findings. Lungs/Pleura: Stable left lower lobe superior segment 1.2 x 1.6 cm nodule (series 6, image 25). Increased size of peripheral consolidation within the right lower lobe measuring 21 x 18 mm (series 6, image 39). Interval development of multiple small nodular consolidations at the periphery of lower lobes bilaterally. Stable bronchiectasis and near complete collapse of the right upper lobe with dense consolidation. Stable left apex pleuroparenchymal consolidation. Stable moderate to severe emphysema the lungs. Upper Abdomen: No acute abnormality. Musculoskeletal: No chest wall abnormality. No acute or significant osseous findings. Review of the MIP images confirms the above findings. IMPRESSION: 1. No evidence of acute pulmonary embolism. 2. Interval development of multiple new nodular opacities at the periphery of the lower lobes likely representing pneumonia. 3. 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 4. Increased size of peripheral nodule in the right lower lobe, stable left lower lobe superior segment nodule. 5. Aortic Atherosclerosis (ICD10-I70.0) and Emphysema (ICD10-J43.9). Coronary artery calcific atherosclerosis. Electronically Signed   By: Kristine Garbe M.D.   On: 10/08/2018 02:34   Dg Chest Port 1 View  Result Date: 10/08/2018 CLINICAL DATA:  Atrial fibrillation.  Chest pain. EXAM: PORTABLE CHEST 1 VIEW COMPARISON:  September 23, 2018 FINDINGS: Pleuroparenchymal changes in the right apex. Mild pulmonary venous congestion without overt edema. The  cardiomediastinal silhouette is stable. No suspicious infiltrate. IMPRESSION: Mild pulmonary venous congestion. Chronic changes in the right apex. No other acute abnormalities. Electronically Signed   By: Dorise Bullion III M.D   On: 10/08/2018 01:54   ASSESSMENT AND PLAN:   77 y.o. female with known essential hypertension mixed hyperlipidemia and apparent coronary atherosclerosis by previous cardiac catheterization in the remote past.  The patient has had new onset substernal chest pressure and pain radiating into her back and causing her to feel weak and fatigued and short of breath  1. acute non-ST EMI -patient came in with chest pain radiating to her back and feeling weak which shortness of breath -troponin 0.74--- 3.92--- 7.06 -she has history of hypertension hyperlipidemia -Dr. Nehemiah Massed to do cardiac cath -- continue IV heparin drip -continue aspirin, atenolol, losartan -add statins  2. hypertension continue atenolol and losartan  3.COPD appears stable.  4. History of rheumatoid arthritis. Continue plaquenil  5 DVT prophylaxis already on IV heparin  Case discussed with Care Management/Social Worker. Management plans discussed with the patient, family and they are in agreement.  CODE STATUS: full  DVT Prophylaxis: heparin  TOTAL TIME TAKING CARE OF THIS PATIENT: *30* minutes.  >50% time spent on counselling and coordination of care  POSSIBLE D/C IN *2-3 DAYS, DEPENDING ON CLINICAL CONDITION.  Note: This dictation was prepared with Dragon dictation along with smaller phrase technology. Any transcriptional errors that result from this process are unintentional.  Fritzi Mandes M.D on 10/08/2018 at 3:45 PM  Between 7am to 6pm -  Pager - [phone]  After 6pm go to [email or web address] - password [email or web address] [facility]  Office  [phone]  CC: Primary care physician; Adin Hector, MD

## 2018-10-08 NOTE — Progress Notes (Signed)
ANTICOAGULATION CONSULT NOTE - Initial Consult  Pharmacy Consult for heparin Indication: chest pain/ACS  Allergies  Allergen Reactions  . Ace Inhibitors     Other reaction(s): Cough  . Alendronate     Other reaction(s): Other (See Comments) GI UPSET  . Coreg [Carvedilol] Other (See Comments)    syncope  . Duloxetine Other (See Comments)  . Morphine Other (See Comments)    confusion  . Pregabalin     Other reaction(s): Other (See Comments) intolerant  . Statins Other (See Comments)  . Verapamil     Other reaction(s): Other (See Comments) CONSTIPATION    Patient Measurements: Height: 5\' 4"  (162.6 cm) Weight: 98 lb 5.2 oz (44.6 kg) IBW/kg (Calculated) : 54.7 Heparin Dosing Weight: 44.6 kg  Vital Signs: Temp: 97.7 F (36.5 C) (11/21 0125) Temp Source: Oral (11/21 0125) BP: 95/65 (11/21 0236) Pulse Rate: 66 (11/21 0217)  Labs: Recent Labs    10/08/18 0124  HGB 10.6*  HCT 34.3*  PLT 312  CREATININE 0.87  TROPONINI 0.74*    Estimated Creatinine Clearance: 38.7 mL/min (by C-G formula based on SCr of 0.87 mg/dL).   Medical History: Past Medical History:  Diagnosis Date  . Allergic rhinitis   . Anxiety   . Asthma   . B12 deficiency   . Breast cancer ([place]) 1999   RT LUMPECTOMY  . Carcinoma of right breast treated with adjuvant chemotherapy ([place]) 1999   RT LUMPECTOMY  . CHF (congestive heart failure) ([place])   . Colon cancer ([place]) 2010  . Colon cancer ([place])   . COPD (chronic obstructive pulmonary disease) ([place])   . Hyperlipemia   . Hypertension   . MRSA pneumonia ([place])   . Osteoporosis   . Radiation 1999   BREAST CA  . 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 amiodarone  150 mg Intravenous Once  . aspirin  81 mg Oral Daily  . diltiazem  10 mg Intravenous Once  . heparin  2,600 Units Intravenous Once  . ipratropium-albuterol  3 mL Nebulization Q6H  . methylPREDNISolone (SOLU-MEDROL) injection  60 mg Intravenous Q12H   Followed by   . [START ON 10/10/2018] predniSONE  10 mg Oral Q breakfast    Assessment: Patient admitted for CP and was in afib w/ RVR, EKG also showing some ST depression. Initial trops 0.74. Patient not on PTA anticoagulation. Patient is being started on heparin drip for NSTEMI/afib  Goal of Therapy:  Heparin level 0.3-0.7 units/ml Monitor platelets by anticoagulation protocol: Yes   Plan:  Will bolus w/ heparin 2600 units IV x 1 Will start drip @ 700 units/hr  Will check anti-Xa @ 1100 Baseline labs drawn Will monitor daily CBC's and adjust per anti-Xa levels.  Tobie Lords, PharmD, BCPS Clinical Pharmacist 10/08/2018

## 2018-10-08 NOTE — Progress Notes (Signed)
[facility] Encounter Note  Patient: Robin Hudson / Admit Date: 10/08/2018 / Date of Encounter: 10/08/2018, 5:16 PM   Subjective: She has resolution of chest discomfort and weakness.  Patient is hemodynamically stable.  Patient has had some telemetry showing nonsustained ventricular tachycardia .  Catheterization showing mild global LV systolic dysfunction with ejection fraction of 45% Diffuse calcifications and three-vessel disease with maximum stenosis of 65% to 70% of mid LAD  Review of Systems: Positive for: Redness of breath Negative for: Vision change, hearing change, syncope, dizziness, nausea, vomiting,diarrhea, bloody stool, stomach pain, cough, congestion, diaphoresis, urinary frequency, urinary pain,skin lesions, skin rashes Others previously listed  Objective: Telemetry: Normal sinus rhythm Physical Exam: Blood pressure 107/60, pulse 80, temperature 97.8 F (36.6 C), temperature source Oral, resp. rate (!) 22, height 5\' 4"  (1.626 m), weight 44.9 kg, SpO2 96 %. Body mass index is 16.99 kg/m. General: Well developed, well nourished, in no acute distress. Head: Normocephalic, atraumatic, sclera non-icteric, no xanthomas, nares are without discharge. Neck: No apparent masses Lungs: Normal respirations with no wheezes, no rhonchi, no rales , no crackles   Heart: Regular rate and rhythm, normal S1 S2, no murmur, no rub, no gallop, PMI is normal size and placement, carotid upstroke normal without bruit, jugular venous pressure normal Abdomen: Soft, non-tender, non-distended with normoactive bowel sounds. No hepatosplenomegaly. Abdominal aorta is normal size without bruit Extremities: No edema, no clubbing, no cyanosis, no ulcers,  Peripheral: 2+ radial, 2+ femoral, 2+ dorsal pedal pulses Neuro: Alert and oriented. Moves all extremities spontaneously. Psych:  Responds to questions appropriately with a normal affect.   Intake/Output Summary (Last 24 hours)  at 10/08/2018 1716 Last data filed at 10/08/2018 1200 Gross per 24 hour  Intake 1162 ml  Output -  Net 1162 ml    Inpatient Medications:  . [MAR Hold] aspirin  81 mg Oral Daily  . [START ON 10/09/2018] aspirin  81 mg Oral Pre-Cath  . [MAR Hold] atenolol  25 mg Oral Daily  . [MAR Hold] budesonide (PULMICORT) nebulizer solution  0.25 mg Nebulization BID  . [MAR Hold] cholecalciferol  1,000 Units Oral Daily  . [MAR Hold] diltiazem  10 mg Intravenous Once  . [MAR Hold] escitalopram  5 mg Oral Daily  . [MAR Hold] feeding supplement (ENSURE ENLIVE)  237 mL Oral TID BM  . [MAR Hold] hydroxychloroquine  400 mg Oral Daily  . [MAR Hold] ipratropium-albuterol  3 mL Nebulization Q6H  . [MAR Hold] losartan  50 mg Oral Daily  . [MAR Hold] predniSONE  40 mg Oral Q breakfast   Followed by  . [MAR Hold] methylPREDNISolone (SOLU-MEDROL) injection  60 mg Intravenous Q12H  . [MAR Hold] mometasone-formoterol  2 puff Inhalation BID  . [MAR Hold] pantoprazole (PROTONIX) IV  40 mg Intravenous Daily  . sodium chloride flush  3 mL Intravenous Q12H   Infusions:  . sodium chloride    . [START ON 10/09/2018] sodium chloride     Followed by  . [START ON 10/09/2018] sodium chloride 1 mL/kg/hr (10/08/18 1401)  . [MAR Hold] doxycycline (VIBRAMYCIN) IV 100 mg (10/08/18 1154)  . heparin Stopped (10/08/18 1403)    Labs: Recent Labs    10/08/18 0124  NA 142  K 4.1  CL 102  CO2 34*  GLUCOSE 117*  BUN 19  CREATININE 0.87  CALCIUM 8.3*  MG 2.0  PHOS 3.1   Recent Labs    10/08/18 0124  AST 29  ALT 18  ALKPHOS 53  BILITOT 0.3  PROT 6.0*  ALBUMIN 3.1*   Recent Labs    10/08/18 0124 10/08/18 0538  WBC 11.8* 9.7  HGB 10.6* 9.2*  HCT 34.3* 29.9*  MCV 91.2 92.3  PLT 312 253   Recent Labs    10/08/18 0124 10/08/18 0538 10/08/18 0838  TROPONINI 0.74* 3.92* 7.06*   Invalid input(s): POCBNP No results for input(s): HGBA1C in the last 72 hours.   Weights: Filed Weights   10/08/18 0127  10/08/18 0427 10/08/18 1137  Weight: 44.6 kg 43.3 kg 44.9 kg     Radiology/Studies:  Dg Chest 2 View  Result Date: 09/23/2018 CLINICAL DATA:  Dyspnea and cough EXAM: CHEST - 2 VIEW COMPARISON:  09/13/2018 FINDINGS: COPD with hyperinflation. Severe apical scarring right greater than left is unchanged. No superimposed edema or pneumonia. No change from the recent study. IMPRESSION: Severe chronic lung disease without interval change. Electronically Signed   By: Franchot Gallo M.D.   On: 09/23/2018 18:40   Ct Angio Chest Pe W And/or Wo Contrast  Result Date: 10/08/2018 CLINICAL DATA:  77 y/o  F; EXAM: CT ANGIOGRAPHY CHEST WITH CONTRAST TECHNIQUE: Multidetector CT imaging of the chest was performed using the standard protocol during bolus administration of intravenous contrast. Multiplanar CT image reconstructions and MIPs were obtained to evaluate the vascular anatomy. CONTRAST:  61mL OMNIPAQUE IOHEXOL 350 MG/ML SOLN COMPARISON:  08/08/2018 CT angiogram of the chest. 07/17/2017 PET-CT. FINDINGS: Cardiovascular: Stable occlusion of the right upper lobe apical segment pulmonary artery (series 7, image 44). Satisfactory opacification of the pulmonary arteries to the segmental level. No evidence of acute pulmonary embolism. Stable mild cardiomegaly. Stable coronary and aortic calcific atherosclerosis. Normal caliber thoracic aorta. No pericardial effusion. Mediastinum/Nodes: No enlarged mediastinal, hilar, or axillary lymph nodes. Thyroid gland, trachea, and esophagus demonstrate no significant findings. Lungs/Pleura: Stable left lower lobe superior segment 1.2 x 1.6 cm nodule (series 6, image 25). Increased size of peripheral consolidation within the right lower lobe measuring 21 x 18 mm (series 6, image 39). Interval development of multiple small nodular consolidations at the periphery of lower lobes bilaterally. Stable bronchiectasis and near complete collapse of the right upper lobe with dense  consolidation. Stable left apex pleuroparenchymal consolidation. Stable moderate to severe emphysema the lungs. Upper Abdomen: No acute abnormality. Musculoskeletal: No chest wall abnormality. No acute or significant osseous findings. Review of the MIP images confirms the above findings. IMPRESSION: 1. No evidence of acute pulmonary embolism. 2. Interval development of multiple new nodular opacities at the periphery of the lower lobes likely representing pneumonia. 3. 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 4. Increased size of peripheral nodule in the right lower lobe, stable left lower lobe superior segment nodule. 5. Aortic Atherosclerosis (ICD10-I70.0) and Emphysema (ICD10-J43.9). Coronary artery calcific atherosclerosis. Electronically Signed   By: Kristine Garbe M.D.   On: 10/08/2018 02:34   Dg Chest Port 1 View  Result Date: 10/08/2018 CLINICAL DATA:  Atrial fibrillation.  Chest pain. EXAM: PORTABLE CHEST 1 VIEW COMPARISON:  September 23, 2018 FINDINGS: Pleuroparenchymal changes in the right apex. Mild pulmonary venous congestion without overt edema. The cardiomediastinal silhouette is stable. No suspicious infiltrate. IMPRESSION: Mild pulmonary venous congestion. Chronic changes in the right apex. No other acute abnormalities. Electronically Signed   By: Dorise Bullion III M.D   On: 10/08/2018 01:54   Dg Chest Port 1 View  Result Date: 09/13/2018 CLINICAL DATA:  Shortness  of breath EXAM: PORTABLE CHEST 1 VIEW COMPARISON:  Chest radiograph and chest CT August 08, 2018 FINDINGS: There is scarring with volume loss/asymmetric pleural thickening in the right apex. A lesser degree of scarring is noted in the left apex. There is also scarring in the right base. There is no frank edema or consolidation. The heart size is normal. The pulmonary vascularity is stable with a degree of distortion  due to scarring. No adenopathy evident. There are surgical clips in the right axilla with evidence of prior mastectomy on the right. There is aortic atherosclerosis. No bone lesions. IMPRESSION: Areas of scarring in the upper lobes, more on the right than on the left. There is asymmetric apical pleural thickening on the right. No edema or consolidation. Stable cardiac silhouette. There is aortic atherosclerosis. Aortic Atherosclerosis (ICD10-I70.0). Electronically Signed   By: Lowella Grip III M.D.   On: 09/13/2018 02:18     Assessment and Recommendation  77 y.o. female with essential hypertension mixed hyperlipidemia coronary artery disease with a non-ST elevation myocardial infarction and moderate coronary atherosclerosis which appears to be noncritical and appears amenable for medication management at this time  1.  Continue dual antiplatelet therapy for myocardial infarction 2.  Discontinuation of heparin 3.  Beta-blocker for episodes of wide-complex tachycardia 4.  Begin rehabilitation following for any further significant symptoms and need for further intervention  Signed, Serafina Royals M.D. FACC

## 2018-10-08 NOTE — Consult Note (Addendum)
Name: Robin Robin Hudson MRN: [id number] DOB: 10-Oct-1941    ADMISSION DATE:  10/08/2018 CONSULTATION DATE:  10/08/18  REFERRING MD :  Dr. Ignacia Felling  CHIEF COMPLAINT:  Chest pain  BRIEF PATIENT DESCRIPTION:  77 y.o. Female admitted with new onset A-fib w/ RVR, Elevated Troponin in setting of demand ischemia vs. NSTEMI, and Acute Hypoxic Hypercapnic Respiratory Failure in setting of COPD Exacerbation.  SIGNIFICANT EVENTS  10/08/18>> Admission to [place]:  CTA Chest 10/08/18>> Echocardiogram 10/08/18>>  CULTURES:  ANTIBIOTICS: Azithromycin 11/21>>  HISTORY OF PRESENT ILLNESS:   Robin Robin Hudson is a 77 y.o. Female with a PMH as listed below who presents to [facility] ED on 10/08/18 with c/o chest pain.  She reports that she was not feeling well all day on 11/20, and that she developed severe sharp upper chest pain around 11:30 pm on 11/20 after getting up from laying down.  She described the pain as constant, non-radiating, non-reproducable.  She denied palpitations, but did report Shortness of breath, diaphoresis, and nausea.  Upon arrival to ED, she was noted to have narrow complex tachycardia with HR 130-140, hypoxia, and tachypnea.  Initial workup in the ED revealed Troponin 0.74, WBC 11.8, serum CO2 34, and Venous Blood Gas: pH 7.3 / CO2 78 / Bicarb 38.5.  CXR is concerning for mild pulmonary venous congestion without overt edema, and with chronic changes in the right apex.  CTA Chest is pending.  She is being admitted to the [facility] unit for treatment of A-fib w/ RVR, elevated troponin in the setting of Demand ischemia verses NSTEMI, and Acute Hypoxic Hypercapnic Respiratory Failure in the setting of acute COPD Exacerbation.  PCCM is consulted for further management.  PAST MEDICAL HISTORY :   has a past medical history of Allergic rhinitis, Anxiety, Asthma, B12 deficiency, Breast cancer ([place]) (1999), Carcinoma of right breast treated with adjuvant chemotherapy ([place]) (1999), CHF  (congestive heart failure) ([place]), Colon cancer ([place]) (2010), Colon cancer ([place]), COPD (chronic obstructive pulmonary disease) ([place]), Hyperlipemia, Hypertension, MRSA pneumonia ([place]), Osteoporosis, Radiation (1999), and Right adrenal mass ([place]).  has a past surgical history that includes Abdominal hysterectomy; Appendectomy; colectomy; Image guided sinus surgery; Breast surgery (Right); Open reduction internal fixation (orif) distal radial fracture (Left, 06/04/2016); and Carpal tunnel release (Left, 06/04/2016). Prior to Admission medications   Medication Sig Start Date End Date Taking? Authorizing Provider  albuterol (PROAIR HFA) 108 (90 Base) MCG/ACT inhaler Inhale 2 puffs into the lungs every 6 (six) hours as needed for wheezing or shortness of breath.  10/31/17   [provider]  albuterol (PROVENTIL) (2.5 MG/3ML) 0.083% nebulizer solution Take 2.5 mg by nebulization every 6 (six) hours as needed for wheezing or shortness of breath.    [provider]  ALPRAZolam Duanne Moron) 0.5 MG tablet Take 1 tablet by mouth 2 (two) times daily as needed for anxiety.  04/19/15   [provider]  aspirin EC 81 MG tablet Take 1 tablet (81 mg total) by mouth daily. 06/28/16   Fritzi Mandes, MD  atenolol (TENORMIN) 50 MG tablet Take 1 tablet (50 mg total) by mouth daily. Patient taking differently: Take 25 mg by mouth daily.  05/18/15   Tama High III, MD  azelastine (ASTELIN) 0.1 % nasal spray Place 1 spray into both nostrils 2 (two) times daily. Use in each nostril as directed    [provider]  butalbital-acetaminophen-caffeine (FIORICET, ESGIC) 50-325-40 MG tablet Take 1 tablet by mouth every 6 (six) hours as needed.  12/19/17   [provider]  cholecalciferol (VITAMIN D) 1000 units tablet Take 1,000 Units by mouth daily.    [provider]  escitalopram (LEXAPRO) 5 MG tablet Take 5 mg by mouth daily.    [provider]  Fluticasone-Salmeterol (ADVAIR DISKUS)  250-50 MCG/DOSE AEPB Inhale 1 puff into the lungs 2 (two) times daily.  12/22/17   [provider]  HYDROcodone-acetaminophen (NORCO) 7.5-325 MG tablet Take 1 tablet by mouth 3 (three) times daily as needed for moderate pain.    [provider]  hydroxychloroquine (PLAQUENIL) 200 MG tablet Take 400 mg by mouth daily.    [provider]  levofloxacin (LEVAQUIN) 250 MG tablet Take 1 tablet (250 mg total) by mouth daily. 09/26/18   Epifanio Lesches, MD  lidocaine (LIDODERM) 5 % Place 1 patch onto the skin daily. Remove & Discard patch within 12 hours or as directed by MD 09/26/18   Epifanio Lesches, MD  losartan (COZAAR) 50 MG tablet Take 1 tablet (50 mg total) by mouth daily. 05/18/15   Tama High III, MD  pantoprazole (PROTONIX) 20 MG tablet Take 1 tablet (20 mg total) 2 (two) times daily by mouth. 09/23/17 03/04/18  Merlyn Lot, MD  predniSONE (STERAPRED UNI-PAK 21 TAB) 10 MG (21) TBPK tablet Taper by 10 mg daily 09/26/18   Epifanio Lesches, MD   Allergies  Allergen Reactions  . Ace Inhibitors     Other reaction(s): Cough  . Alendronate     Other reaction(s): Other (See Comments) GI UPSET  . Coreg [Carvedilol] Other (See Comments)    syncope  . Duloxetine Other (See Comments)  . Morphine Other (See Comments)    confusion  . Pregabalin     Other reaction(s): Other (See Comments) intolerant  . Statins Other (See Comments)  . Verapamil     Other reaction(s): Other (See Comments) CONSTIPATION    FAMILY HISTORY:  family history includes Breast cancer in her other; Breast cancer (age of onset: 70) in her sister; Breast cancer (age of onset: 8) in her sister; Heart attack in her mother. SOCIAL HISTORY:  reports that she has been smoking cigarettes. She has been smoking about 0.25 packs per day. She has never used smokeless tobacco. She reports that she does not drink alcohol or use drugs.  REVIEW OF SYSTEMS:  Positives in BOLD Constitutional:  Negative for fever, chills, weight loss, malaise/fatigue and diaphoresis.  HENT: Negative for hearing loss, ear pain, nosebleeds, congestion, sore throat, neck pain, tinnitus and ear discharge.   Eyes: Negative for blurred vision, double vision, photophobia, pain, discharge and redness.  Respiratory: Negative for cough, hemoptysis, sputum production, shortness of breath, +wheezing and stridor.   Cardiovascular: Negative for chest pain, palpitations, orthopnea, claudication, leg swelling and PND.  Gastrointestinal: Negative for heartburn, nausea, vomiting, abdominal pain, diarrhea, constipation, blood in stool and melena.  Genitourinary: Negative for dysuria, urgency, frequency, hematuria and flank pain.  Musculoskeletal: Negative for myalgias, back pain, joint pain and falls.  Skin: Negative for itching and rash.  Neurological: Negative for dizziness, tingling, tremors, sensory change, speech change, focal weakness, seizures, loss of consciousness, weakness and headaches.  Endo/Heme/Allergies: Negative for environmental allergies and polydipsia. Does not bruise/bleed easily.  SUBJECTIVE:  Denies chest pain, palpitations, or shortness of breath, cough, sputum production Reports wheezing On 2.5 L [place], which is her home baseline at home  VITAL SIGNS: Temp:  [97.7 F (36.5 C)] 97.7 F (36.5 C) (11/21 0125) Pulse Rate:  [37-146] 66 (11/21 0217)  Resp:  [20-43] 25 (11/21 0240) BP: (79-112)/(58-85) 95/65 (11/21 0236) SpO2:  [58 %-93 %] 58 % (11/21 0217) Weight:  [44.6 kg] 44.6 kg (11/21 0127)  PHYSICAL EXAMINATION: General:  Acute on chronically ill appearing female, laying in bed, on 2.5 L [place], in NAD Neuro:  Awake, A&O x4, follows commands, no focal deficits, speech is clear HEENT:  Atraumatic, normocephalic, neck supple, no JVD Cardiovascular:  Irregularly irregular rhythm, rate controlled, no M/R/G, 2+ pulses throughout Lungs:  Inspiratory wheezing bilaterally, even, nonlabored, normal  effort Abdomen:  Soft, nontender, nondistended, BS+ x4 Musculoskeletal:  No deformities, normal bulk and tone, no edema Skin:  Warm/dry.  No obvious rashes, lesions, or ulcerations  Recent Labs  Lab 10/08/18 0124  NA 142  K 4.1  CL 102  CO2 34*  BUN 19  CREATININE 0.87  GLUCOSE 117*   Recent Labs  Lab 10/08/18 0124  HGB 10.6*  HCT 34.3*  WBC 11.8*  PLT 312   Dg Chest Port 1 View  Result Date: 10/08/2018 CLINICAL DATA:  Atrial fibrillation.  Chest pain. EXAM: PORTABLE CHEST 1 VIEW COMPARISON:  September 23, 2018 FINDINGS: Pleuroparenchymal changes in the right apex. Mild pulmonary venous congestion without overt edema. The cardiomediastinal silhouette is stable. No suspicious infiltrate. IMPRESSION: Mild pulmonary venous congestion. Chronic changes in the right apex. No other acute abnormalities. Electronically Signed   By: Dorise Bullion III M.D   On: 10/08/2018 01:54    ASSESSMENT / PLAN:  A-fib w/ RVR (new onset) Elevated Troponin, demand ischemia vs NSTEMI -Troponin 0.74> Hx: HTN, HLD, CHF -Cardiac monitoring -Maintain MAP >65 -Received IV Cardizem in ED, rate currently controlled -Heparin drip -Obtain TSH -Echocardiogram pending -Cardiology consult, appreciate input -Continue ASA, beta-blocker, ARB. Off Statis because of allergy  Acute on chronic hypoxic Hypercapnic Respiratory Failure in setting of COPD Exacerbation. Base line home O2 2.5 L/min as needed and steroid dependent. Hx: COPD, Asthma -Supplemental O2 as needed to maintain O2 sats 88 to 94% -Follow intermittent CXR and ABG as needed -Bronchodilators -Add Budesonide nebs -Solu-Medrol 60 mg q12h -Continue Azithromycin -Incentive spirometry -Pulmonary toilet  Mild Leukocytosis -Monitor fever curve -Trend WBC's  Normocytic Normochromic Anemia without signs of active Bleeding Monitor for S/Sx of bleeding Trend CBC Heparin drip for VTE Prophylaxis  Transfuse for Hgb <7  CT angio of the chest in  comparison to 08/08/2018 CT. 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 L L nodule and staple Lt. L sup segment nodule. -Pulmonary evaluation once transferred to floor  DISPOSITION: [place]: Full code VTE PROPHYLAXIS: Heparin drip UPDATES: Updated pt at bedside 10/08/18  Darel Hong, [facility] [place] Pager: [phone] Cell: [phone]  10/08/2018, 3:14 AM   I agree with the documented

## 2018-10-09 ENCOUNTER — Other Ambulatory Visit: Payer: Self-pay

## 2018-10-09 ENCOUNTER — Encounter: Payer: Self-pay | Admitting: Internal Medicine

## 2018-10-09 LAB — CBC
HEMATOCRIT: 27.6 % — AB (ref 36.0–46.0)
Hemoglobin: 8.6 g/dL — ABNORMAL LOW (ref 12.0–15.0)
MCH: 28.4 pg (ref 26.0–34.0)
MCHC: 31.2 g/dL (ref 30.0–36.0)
MCV: 91.1 fL (ref 80.0–100.0)
PLATELETS: 248 10*3/uL (ref 150–400)
RBC: 3.03 MIL/uL — ABNORMAL LOW (ref 3.87–5.11)
RDW: 13.7 % (ref 11.5–15.5)
WBC: 10.4 10*3/uL (ref 4.0–10.5)
nRBC: 0 % (ref 0.0–0.2)

## 2018-10-09 LAB — BASIC METABOLIC PANEL
Anion gap: 4 — ABNORMAL LOW (ref 5–15)
BUN: 10 mg/dL (ref 8–23)
CALCIUM: 8.4 mg/dL — AB (ref 8.9–10.3)
CO2: 31 mmol/L (ref 22–32)
CREATININE: 0.41 mg/dL — AB (ref 0.44–1.00)
Chloride: 105 mmol/L (ref 98–111)
GFR calc non Af Amer: >60 mL/min (ref >60)
GLUCOSE: 121 mg/dL — AB (ref 70–99)
Potassium: 4 mmol/L (ref 3.5–5.1)
Sodium: 140 mmol/L (ref 135–145)

## 2018-10-09 LAB — CALCIUM, IONIZED: Calcium, Ionized, Serum: 4.4 mg/dL — ABNORMAL LOW (ref 4.5–5.6)

## 2018-10-09 MED ORDER — CLOPIDOGREL BISULFATE 75 MG PO TABS
75.0000 mg | ORAL_TABLET | Freq: Every day | ORAL | Status: DC
  Administered 2018-10-09: 75 mg via ORAL
  Filled 2018-10-09: qty 1

## 2018-10-09 MED ORDER — LIDOCAINE 5 % EX PTCH
1.0000 | MEDICATED_PATCH | CUTANEOUS | Status: DC
  Administered 2018-10-09: 1 via TRANSDERMAL
  Filled 2018-10-09: qty 1

## 2018-10-09 MED ORDER — CLOPIDOGREL BISULFATE 75 MG PO TABS: 75.0000 mg | ORAL_TABLET | Freq: Every day | ORAL | 1 refills | Status: AC

## 2018-10-09 MED ORDER — ATENOLOL 50 MG PO TABS: 25.0000 mg | ORAL_TABLET | Freq: Every day | ORAL | 0 refills | Status: AC

## 2018-10-09 MED ORDER — NITROGLYCERIN 0.4 MG SL SUBL: 0.4000 mg | SUBLINGUAL_TABLET | SUBLINGUAL | 12 refills | Status: AC | PRN

## 2018-10-09 NOTE — Discharge Summary (Signed)
[place] [facility] [place] NAME: Robin Hudson    MR#:  [id number]  DATE OF BIRTH:  1941-10-05  DATE OF ADMISSION:  10/08/2018 ADMITTING PHYSICIAN: Arta Silence, MD  DATE OF DISCHARGE: 10/09/2018  PRIMARY CARE PHYSICIAN: Adin Hector, MD    ADMISSION DIAGNOSIS:  chest pain  DISCHARGE DIAGNOSIS:  Acute NSTEMI s/p cath--medical management  SECONDARY DIAGNOSIS:   Past Medical History:  Diagnosis Date  . Allergic rhinitis   . Anxiety   . Asthma   . B12 deficiency   . Breast cancer ([place]) 1999   RT LUMPECTOMY  . Carcinoma of right breast treated with adjuvant chemotherapy ([place]) 1999   RT LUMPECTOMY  . CHF (congestive heart failure) ([place])   . Colon cancer ([place]) 2010  . Colon cancer ([place])   . COPD (chronic obstructive pulmonary disease) ([place])   . Hyperlipemia   . Hypertension   . MRSA pneumonia ([place])   . Osteoporosis   . Radiation 1999   BREAST CA  . Right adrenal mass [facility])     HOSPITAL COURSE:  77 y.o.femalewith known essential hypertension mixed hyperlipidemia and apparent coronary atherosclerosis by previous cardiac catheterization in the remote past. The patient has had new onset substernal chest pressure and pain radiating into her back and causing her to feel weak and fatigued and short of breath  1. acute non-ST EMI-- medical management -patient came in with chest pain radiating to her back and feeling weak which shortness of breath -troponin 0.74--- 3.92--- 7.06 -she has history of hypertension hyperlipidemia -Dr. Nehemiah Massed to do cardiac cath--showed Left ventricular angiogram shows mild global LV systolic function ejection fraction of 45%. moderate 3 vessel coronary artery disease There is significant non critical coronary artery disease and or stenosis involving left anterior descending - was on IV heparin drip -continue aspirin, atenolol, losartan -added statins  2. hypertension continue atenolol  and losartan  3.COPD appears stable.  4. History of rheumatoid arthritis. Continue plaquenil  5 DVT prophylaxis already on IV heparin  D/c home  CONSULTS OBTAINED:  Treatment Team:  Arta Silence, MD Corey Skains, MD  DRUG ALLERGIES:   Allergies  Allergen Reactions  . Ace Inhibitors     Other reaction(s): Cough  . Alendronate     Other reaction(s): Other (See Comments) GI UPSET  . Coreg [Carvedilol] Other (See Comments)    syncope  . Duloxetine Other (See Comments)  . Morphine Other (See Comments)    confusion  . Pregabalin     Other reaction(s): Other (See Comments) intolerant  . Statins Other (See Comments)  . Verapamil     Other reaction(s): Other (See Comments) CONSTIPATION    DISCHARGE MEDICATIONS:   Allergies as of 10/09/2018      Reactions   Ace Inhibitors    Other reaction(s): Cough   Alendronate    Other reaction(s): Other (See Comments) GI UPSET   Coreg [carvedilol] Other (See Comments)   syncope   Duloxetine Other (See Comments)   Morphine Other (See Comments)   confusion   Pregabalin    Other reaction(s): Other (See Comments) intolerant   Statins Other (See Comments)   Verapamil    Other reaction(s): Other (See Comments) CONSTIPATION      Medication List    STOP taking these medications   levofloxacin 250 MG tablet Commonly known as:  LEVAQUIN     TAKE these medications   ADVAIR DISKUS 250-50 MCG/DOSE Aepb Generic drug:  Fluticasone-Salmeterol  Inhale 1 puff into the lungs 2 (two) times daily.   albuterol (2.5 MG/3ML) 0.083% nebulizer solution Commonly known as:  PROVENTIL Take 2.5 mg by nebulization every 6 (six) hours as needed for wheezing or shortness of breath.   PROAIR HFA 108 (90 Base) MCG/ACT inhaler Generic drug:  albuterol Inhale 2 puffs into the lungs every 6 (six) hours as needed for wheezing or shortness of breath.   ALPRAZolam 0.5 MG tablet Commonly known as:  XANAX Take 1 tablet by mouth 2 (two)  times daily as needed for anxiety.   aspirin EC 81 MG tablet Take 1 tablet (81 mg total) by mouth daily.   atenolol 50 MG tablet Commonly known as:  TENORMIN Take 0.5 tablets (25 mg total) by mouth daily.   azelastine 0.1 % nasal spray Commonly known as:  ASTELIN Place 1 spray into both nostrils 2 (two) times daily. Use in each nostril as directed   butalbital-acetaminophen-caffeine 50-325-40 MG tablet Commonly known as:  FIORICET, ESGIC Take 1 tablet by mouth every 6 (six) hours as needed.   cholecalciferol 1000 units tablet Commonly known as:  VITAMIN D Take 1,000 Units by mouth daily.   clopidogrel 75 MG tablet Commonly known as:  PLAVIX Take 1 tablet (75 mg total) by mouth daily. Start taking on:  10/10/2018   escitalopram 5 MG tablet Commonly known as:  LEXAPRO Take 5 mg by mouth daily.   HYDROcodone-acetaminophen 7.5-325 MG tablet Commonly known as:  NORCO Take 1 tablet by mouth 3 (three) times daily as needed for moderate pain.   hydroxychloroquine 200 MG tablet Commonly known as:  PLAQUENIL Take 400 mg by mouth daily.   lidocaine 5 % Commonly known as:  LIDODERM Place 1 patch onto the skin daily. Remove & Discard patch within 12 hours or as directed by MD   losartan 50 MG tablet Commonly known as:  COZAAR Take 1 tablet (50 mg total) by mouth daily.   nitroGLYCERIN 0.4 MG SL tablet Commonly known as:  NITROSTAT Place 1 tablet (0.4 mg total) under the tongue every 5 (five) minutes as needed for chest pain.   pantoprazole 20 MG tablet Commonly known as:  PROTONIX Take 1 tablet (20 mg total) 2 (two) times daily by mouth.   predniSONE 10 MG (21) Tbpk tablet Commonly known as:  STERAPRED UNI-PAK 21 TAB Taper by 10 mg daily       If you experience worsening of your admission symptoms, develop shortness of breath, life threatening emergency, suicidal or homicidal thoughts you must seek medical attention immediately by calling 911 or calling your MD  immediately  if symptoms less severe.  You Must read complete instructions/literature along with all the possible adverse reactions/side effects for all the Medicines you take and that have been prescribed to you. Take any new Medicines after you have completely understood and accept all the possible adverse reactions/side effects.   Please note  You were cared for by a hospitalist during your hospital stay. If you have any questions about your discharge medications or the care you received while you were in the hospital after you are discharged, you can call the unit and asked to speak with the hospitalist on call if the hospitalist that took care of you is not available. Once you are discharged, your primary care physician will handle any further medical issues. Please note that NO REFILLS for any discharge medications will be authorized once you are discharged, as it is imperative that you return to  your primary care physician (or establish a relationship with a primary care physician if you do not have one) for your aftercare needs so that they can reassess your need for medications and monitor your lab values. Today   SUBJECTIVE   Doing well. chornic pain  VITAL SIGNS:  Blood pressure (!) 101/51, pulse 71, temperature 97.9 F (36.6 C), temperature source Oral, resp. rate 18, height 5\' 4"  (1.626 m), weight 44.9 kg, SpO2 96 %.  I/O:    Intake/Output Summary (Last 24 hours) at 10/09/2018 1433 Last data filed at 10/09/2018 0951 Gross per 24 hour  Intake 255.76 ml  Output 900 ml  Net -644.24 ml    PHYSICAL EXAMINATION:  GENERAL:  77 y.o.-year-old patient lying in the bed with no acute distress. thin EYES: Pupils equal, round, reactive to light and accommodation. No scleral icterus. Extraocular muscles intact.  HEENT: Head atraumatic, normocephalic. Oropharynx and nasopharynx clear.  NECK:  Supple, no jugular venous distention. No thyroid enlargement, no tenderness.  LUNGS: Normal  breath sounds bilaterally, no wheezing, rales,rhonchi or crepitation. No use of accessory muscles of respiration.  CARDIOVASCULAR: S1, S2 normal. No murmurs, rubs, or gallops.  ABDOMEN: Soft, non-tender, non-distended. Bowel sounds present. No organomegaly or mass.  EXTREMITIES: No pedal edema, cyanosis, or clubbing. chornic DJD NEUROLOGIC: Cranial nerves II through XII are intact. Muscle strength 5/5 in all extremities. Sensation intact. Gait not checked.  PSYCHIATRIC: The patient is alert and oriented x 3.  SKIN: No obvious rash, lesion, or ulcer.   DATA REVIEW:   CBC  Recent Labs  Lab 10/09/18 0451  WBC 10.4  HGB 8.6*  HCT 27.6*  PLT 248    Chemistries  Recent Labs  Lab 10/08/18 0124 10/09/18 0451  NA 142 140  K 4.1 4.0  CL 102 105  CO2 34* 31  GLUCOSE 117* 121*  BUN 19 10  CREATININE 0.87 0.41*  CALCIUM 8.3* 8.4*  MG 2.0  --   AST 29  --   ALT 18  --   ALKPHOS 53  --   BILITOT 0.3  --     Microbiology Results   Recent Results (from the past 240 hour(s))  MRSA PCR Screening     Status: None   Collection Time: 10/08/18  4:35 AM  Result Value Ref Range Status   MRSA by PCR NEGATIVE NEGATIVE Final    Comment:        The GeneXpert MRSA Assay (FDA approved for NASAL specimens only), is one component of a comprehensive MRSA colonization surveillance program. It is not intended to diagnose MRSA infection nor to guide or monitor treatment for MRSA infections. Performed at [facility], [address] [place], [place] [address]     RADIOLOGY:  Ct Angio Chest Pe W And/or Wo Contrast  Result Date: 10/08/2018 CLINICAL DATA:  77 y/o  F; EXAM: CT ANGIOGRAPHY CHEST WITH CONTRAST TECHNIQUE: Multidetector CT imaging of the chest was performed using the standard protocol during bolus administration of intravenous contrast. Multiplanar CT image reconstructions and MIPs were obtained to evaluate the vascular anatomy. CONTRAST:  48mL OMNIPAQUE IOHEXOL 350  MG/ML SOLN COMPARISON:  08/08/2018 CT angiogram of the chest. 07/17/2017 PET-CT. FINDINGS: Cardiovascular: Stable occlusion of the right upper lobe apical segment pulmonary artery (series 7, image 44). Satisfactory opacification of the pulmonary arteries to the segmental level. No evidence of acute pulmonary embolism. Stable mild cardiomegaly. Stable coronary and aortic calcific atherosclerosis. Normal caliber thoracic aorta. No pericardial effusion. Mediastinum/Nodes: No enlarged  mediastinal, hilar, or axillary lymph nodes. Thyroid gland, trachea, and esophagus demonstrate no significant findings. Lungs/Pleura: Stable left lower lobe superior segment 1.2 x 1.6 cm nodule (series 6, image 25). Increased size of peripheral consolidation within the right lower lobe measuring 21 x 18 mm (series 6, image 39). Interval development of multiple small nodular consolidations at the periphery of lower lobes bilaterally. Stable bronchiectasis and near complete collapse of the right upper lobe with dense consolidation. Stable left apex pleuroparenchymal consolidation. Stable moderate to severe emphysema the lungs. Upper Abdomen: No acute abnormality. Musculoskeletal: No chest wall abnormality. No acute or significant osseous findings. Review of the MIP images confirms the above findings. IMPRESSION: 1. No evidence of acute pulmonary embolism. 2. Interval development of multiple new nodular opacities at the periphery of the lower lobes likely representing pneumonia. 3. 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 4. Increased size of peripheral nodule in the right lower lobe, stable left lower lobe superior segment nodule. 5. Aortic Atherosclerosis (ICD10-I70.0) and Emphysema (ICD10-J43.9). Coronary artery calcific atherosclerosis. Electronically Signed   By: Kristine Garbe M.D.   On: 10/08/2018 02:34   Dg Chest Port 1  View  Result Date: 10/08/2018 CLINICAL DATA:  Atrial fibrillation.  Chest pain. EXAM: PORTABLE CHEST 1 VIEW COMPARISON:  September 23, 2018 FINDINGS: Pleuroparenchymal changes in the right apex. Mild pulmonary venous congestion without overt edema. The cardiomediastinal silhouette is stable. No suspicious infiltrate. IMPRESSION: Mild pulmonary venous congestion. Chronic changes in the right apex. No other acute abnormalities. Electronically Signed   By: Dorise Bullion III M.D   On: 10/08/2018 01:54     Management plans discussed with the patient, family and they are in agreement.  CODE STATUS:     Code Status Orders  (From admission, onward)         Start     Ordered   10/08/18 0425  Full code  Continuous     10/08/18 0424        Code Status History    Date Active Date Inactive Code Status Order ID Comments User Context   09/24/2018 1414 09/26/2018 1603 Full Code [id number]  Gladstone Lighter, MD Inpatient   09/24/2018 0044 09/24/2018 1414 DNR [id number]  Amelia Jo, MD Inpatient   09/13/2018 1202 09/14/2018 1604 DNR [id number]  Epifanio Lesches, MD Inpatient   09/13/2018 0612 09/13/2018 1202 Full Code [id number]  Arta Silence, MD Inpatient   12/29/2016 0146 12/29/2016 1429 Full Code [id number]  Lance Coon, MD Inpatient   07/19/2016 2256 07/20/2016 1635 Full Code [id number]  Merrillan, Aneta, DO Inpatient   12/06/2015 0200 12/07/2015 1534 Full Code [id number]  Harrie Foreman, MD Inpatient   12/03/2015 0049 12/04/2015 1721 Full Code [id number]  Hower, Aaron Mose, MD ED   05/12/2015 1509 05/18/2015 1239 Full Code [id number]  Dustin Flock, MD Inpatient      TOTAL TIME TAKING CARE OF THIS PATIENT: 40 minutes.    Fritzi Mandes M.D on 10/09/2018 at 2:33 PM  Between 7am to 6pm - Pager - [phone] After 6pm go to [email or web address] - password [email or web address] [facility]  Office  [phone]  CC: Primary care physician; Adin Hector, MD

## 2018-10-09 NOTE — Progress Notes (Addendum)
Cardiovascular and Pulmonary Nurse Navigator Note:  77 year old female with PMHx of HTN, HLD, and coronary atherosclerosis.  Patient presented to the ED with new onset of substernal chest pressure and pain radiating into her back with associated weakness, fatigue and SOB.  Patient ruled in for NSTEMI.  Patient underwent Cardiac Cath on 10/08/2018 which revealed moderate three vessel CAD with mild global LV systolic function EF of 38%.  Patient being discharged on the following cardiac medications:  ASA, atenolol, Plavix, nitroglycerin, and losartan.    Patient lives at her with her husband.  Patient has oxygen at home and is ordered for her to use at night and prn during the day.  Patient has portable tanks as well.  Patient denies any problem with obtaining medications, transportation to her appointments, or need for assistance in the home.  Patient did state that she is currently in the donut hole with her medications, but for now patient stated she is able to get her medications.    Heart Attack Bouncing Back" booklet given and reviewed with patient. Discussed the definition of CAD. Reviewed the location of CAD.   ? Discussed modifiable risk factors including controlling blood pressure, cholesterol, and blood sugar; following heart healthy diet; maintaining healthy weight; exercise; and smoking cessation.  ? Discussed cardiac medications including rationale for taking, mechanisms of action, and side effects. Stressed the importance of taking medications as prescribed.  ? Discussed emergency plan for heart attack symptoms. Patient verbalized understanding of need to call 911 and not to drive himself to ER if having cardiac symptoms / chest pain.  ? Heart healthy diet of low sodium, low fat, low cholesterol heart healthy diet discussed. Information on diet provided.    ? Smoking Cessation - Patient reports smoking at most one cigarette a day.  Patient informed this RN that she smokes because she is  bored.  Smoking cessation advised.  Information on tips on quitting given and reviewed with patient.    Exercise - Benefits of exercised discussed.  Patient  Informed patient that his cardiologist has referred him to outpatient Cardiac Rehab. An overview of the program was provided. [place] to walk at home. Patient declined to participate in Cardiac Rehab.  Reviewed AHA guidelines for exercise with patient.  Encouraged patient to remain as active as possible.   ? Patient appreciative of the information.  ? Roanna Epley, RN, BSN, [place]  [facility] Cardiac & Pulmonary Rehab  Cardiovascular & Pulmonary Nurse Navigator  Direct Line: [phone]  Department Phone #: [phone] Fax: [phone]  Email Address: [email or web address]

## 2018-10-09 NOTE — Progress Notes (Signed)
[facility] Encounter Note  Patient: Robin Hudson / Admit Date: 10/08/2018 / Date of Encounter: 10/09/2018, 6:57 AM   Subjective: She has resolution of chest discomfort and weakness.  No evidence of significant symptoms overnight.  Patient is hemodynamically stable.  Patient has had some telemetry showing nonsustained ventricular tachycardia yesterday but no reports today .  Catheterization showing mild global LV systolic dysfunction with ejection fraction of 45% Diffuse calcifications and three-vessel disease with maximum stenosis of 65% to 70% of mid LAD which appears to be noncritical and possibly not the primary cause of her admission  Review of Systems: Positive for: Weakness Negative for: Vision change, hearing change, syncope, dizziness, nausea, vomiting,diarrhea, bloody stool, stomach pain, cough, congestion, diaphoresis, urinary frequency, urinary pain,skin lesions, skin rashes Others previously listed  Objective: Telemetry: Normal sinus rhythm Physical Exam: Blood pressure (!) 111/44, pulse 75, temperature 98.6 F (37 C), resp. rate 20, height 5\' 4"  (1.626 m), weight 44.9 kg, SpO2 98 %. Body mass index is 16.99 kg/m. General: Well developed, well nourished, in no acute distress. Head: Normocephalic, atraumatic, sclera non-icteric, no xanthomas, nares are without discharge. Neck: No apparent masses Lungs: Normal respirations with no wheezes, no rhonchi, no rales , no crackles   Heart: Regular rate and rhythm, normal S1 S2, no murmur, no rub, no gallop, PMI is normal size and placement, carotid upstroke normal without bruit, jugular venous pressure normal Abdomen: Soft, non-tender, non-distended with normoactive bowel sounds. No hepatosplenomegaly. Abdominal aorta is normal size without bruit Extremities: No edema, no clubbing, no cyanosis, no ulcers,  Peripheral: 2+ radial, 2+ femoral, 2+ dorsal pedal pulses Neuro: Alert and oriented. Moves all  extremities spontaneously. Psych:  Responds to questions appropriately with a normal affect.   Intake/Output Summary (Last 24 hours) at 10/09/2018 0657 Last data filed at 10/09/2018 0010 Gross per 24 hour  Intake 417.76 ml  Output 600 ml  Net -182.24 ml    Inpatient Medications:  . aspirin  81 mg Oral Daily  . budesonide (PULMICORT) nebulizer solution  0.25 mg Nebulization BID  . cholecalciferol  1,000 Units Oral Daily  . diltiazem  10 mg Intravenous Once  . escitalopram  5 mg Oral Daily  . feeding supplement (ENSURE ENLIVE)  237 mL Oral TID BM  . hydroxychloroquine  400 mg Oral Daily  . ipratropium-albuterol  3 mL Nebulization Q6H  . losartan  50 mg Oral Daily  . [START ON 10/10/2018] predniSONE  40 mg Oral Q breakfast   Followed by  . methylPREDNISolone (SOLU-MEDROL) injection  60 mg Intravenous Q12H  . metoprolol tartrate  25 mg Oral BID  . mometasone-formoterol  2 puff Inhalation BID  . pantoprazole (PROTONIX) IV  40 mg Intravenous Daily  . sodium chloride flush  3 mL Intravenous Q12H   Infusions:  . sodium chloride    . sodium chloride     Followed by  . sodium chloride 1 mL/kg/hr (10/08/18 1401)  . doxycycline (VIBRAMYCIN) IV 100 mg (10/08/18 2259)    Labs: Recent Labs    10/08/18 0124 10/09/18 0451  NA 142 140  K 4.1 4.0  CL 102 105  CO2 34* 31  GLUCOSE 117* 121*  BUN 19 10  CREATININE 0.87 0.41*  CALCIUM 8.3* 8.4*  MG 2.0  --   PHOS 3.1  --    Recent Labs    10/08/18 0124  AST 29  ALT 18  ALKPHOS 53  BILITOT 0.3  PROT 6.0*  ALBUMIN 3.1*  Recent Labs    10/08/18 0538 10/09/18 0451  WBC 9.7 10.4  HGB 9.2* 8.6*  HCT 29.9* 27.6*  MCV 92.3 91.1  PLT 253 248   Recent Labs    10/08/18 0124 10/08/18 0538 10/08/18 0838  TROPONINI 0.74* 3.92* 7.06*   Invalid input(s): POCBNP No results for input(s): HGBA1C in the last 72 hours.   Weights: Filed Weights   10/08/18 0127 10/08/18 0427 10/08/18 1137  Weight: 44.6 kg 43.3 kg 44.9 kg      Radiology/Studies:  Dg Chest 2 View  Result Date: 09/23/2018 CLINICAL DATA:  Dyspnea and cough EXAM: CHEST - 2 VIEW COMPARISON:  09/13/2018 FINDINGS: COPD with hyperinflation. Severe apical scarring right greater than left is unchanged. No superimposed edema or pneumonia. No change from the recent study. IMPRESSION: Severe chronic lung disease without interval change. Electronically Signed   By: Franchot Gallo M.D.   On: 09/23/2018 18:40   Ct Angio Chest Pe W And/or Wo Contrast  Result Date: 10/08/2018 CLINICAL DATA:  77 y/o  F; EXAM: CT ANGIOGRAPHY CHEST WITH CONTRAST TECHNIQUE: Multidetector CT imaging of the chest was performed using the standard protocol during bolus administration of intravenous contrast. Multiplanar CT image reconstructions and MIPs were obtained to evaluate the vascular anatomy. CONTRAST:  64mL OMNIPAQUE IOHEXOL 350 MG/ML SOLN COMPARISON:  08/08/2018 CT angiogram of the chest. 07/17/2017 PET-CT. FINDINGS: Cardiovascular: Stable occlusion of the right upper lobe apical segment pulmonary artery (series 7, image 44). Satisfactory opacification of the pulmonary arteries to the segmental level. No evidence of acute pulmonary embolism. Stable mild cardiomegaly. Stable coronary and aortic calcific atherosclerosis. Normal caliber thoracic aorta. No pericardial effusion. Mediastinum/Nodes: No enlarged mediastinal, hilar, or axillary lymph nodes. Thyroid gland, trachea, and esophagus demonstrate no significant findings. Lungs/Pleura: Stable left lower lobe superior segment 1.2 x 1.6 cm nodule (series 6, image 25). Increased size of peripheral consolidation within the right lower lobe measuring 21 x 18 mm (series 6, image 39). Interval development of multiple small nodular consolidations at the periphery of lower lobes bilaterally. Stable bronchiectasis and near complete collapse of the right upper lobe with dense consolidation. Stable left apex pleuroparenchymal consolidation. Stable  moderate to severe emphysema the lungs. Upper Abdomen: No acute abnormality. Musculoskeletal: No chest wall abnormality. No acute or significant osseous findings. Review of the MIP images confirms the above findings. IMPRESSION: 1. No evidence of acute pulmonary embolism. 2. Interval development of multiple new nodular opacities at the periphery of the lower lobes likely representing pneumonia. 3. 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 4. Increased size of peripheral nodule in the right lower lobe, stable left lower lobe superior segment nodule. 5. Aortic Atherosclerosis (ICD10-I70.0) and Emphysema (ICD10-J43.9). Coronary artery calcific atherosclerosis. Electronically Signed   By: Kristine Garbe M.D.   On: 10/08/2018 02:34   Dg Chest Port 1 View  Result Date: 10/08/2018 CLINICAL DATA:  Atrial fibrillation.  Chest pain. EXAM: PORTABLE CHEST 1 VIEW COMPARISON:  September 23, 2018 FINDINGS: Pleuroparenchymal changes in the right apex. Mild pulmonary venous congestion without overt edema. The cardiomediastinal silhouette is stable. No suspicious infiltrate. IMPRESSION: Mild pulmonary venous congestion. Chronic changes in the right apex. No other acute abnormalities. Electronically Signed   By: Dorise Bullion III M.D   On: 10/08/2018 01:54   Dg Chest Port 1 View  Result Date: 09/13/2018 CLINICAL DATA:  Shortness of breath EXAM: PORTABLE CHEST 1 VIEW COMPARISON:  Chest  radiograph and chest CT August 08, 2018 FINDINGS: There is scarring with volume loss/asymmetric pleural thickening in the right apex. A lesser degree of scarring is noted in the left apex. There is also scarring in the right base. There is no frank edema or consolidation. The heart size is normal. The pulmonary vascularity is stable with a degree of distortion due to scarring. No adenopathy evident. There are surgical clips in the  right axilla with evidence of prior mastectomy on the right. There is aortic atherosclerosis. No bone lesions. IMPRESSION: Areas of scarring in the upper lobes, more on the right than on the left. There is asymmetric apical pleural thickening on the right. No edema or consolidation. Stable cardiac silhouette. There is aortic atherosclerosis. Aortic Atherosclerosis (ICD10-I70.0). Electronically Signed   By: Lowella Grip III M.D.   On: 09/13/2018 02:18     Assessment and Recommendation  77 y.o. female with essential hypertension mixed hyperlipidemia coronary artery disease with a non-ST elevation myocardial infarction and moderate coronary atherosclerosis which appears to be noncritical and appears amenable for medication management at this time  1.  Continue dual antiplatelet therapy for myocardial infarction 2.  No further cardiac intervention at this time due to improvements of symptoms without need for PCI and stent placement 3.  Beta-blocker for episodes of wide-complex tachycardia and LV systolic dysfunction and myocardial infarction and potentially increasing dose to 50 mg today 4.  Begin rehabilitation following for any further significant symptoms and need for further intervention  Signed, Serafina Royals M.D. FACC

## 2018-10-23 DIAGNOSIS — I2101 ST elevation (STEMI) myocardial infarction involving left main coronary artery: Secondary | ICD-10-CM | POA: Diagnosis not present

## 2018-10-23 DIAGNOSIS — R05 Cough: Secondary | ICD-10-CM | POA: Diagnosis not present

## 2018-10-23 DIAGNOSIS — J439 Emphysema, unspecified: Secondary | ICD-10-CM | POA: Diagnosis not present

## 2018-10-23 DIAGNOSIS — R0609 Other forms of dyspnea: Secondary | ICD-10-CM | POA: Diagnosis not present

## 2018-10-26 ENCOUNTER — Other Ambulatory Visit: Payer: Self-pay

## 2018-10-26 DIAGNOSIS — I2510 Atherosclerotic heart disease of native coronary artery without angina pectoris: Secondary | ICD-10-CM | POA: Diagnosis not present

## 2018-10-26 DIAGNOSIS — E782 Mixed hyperlipidemia: Secondary | ICD-10-CM | POA: Diagnosis not present

## 2018-10-26 DIAGNOSIS — I43 Cardiomyopathy in diseases classified elsewhere: Secondary | ICD-10-CM | POA: Diagnosis not present

## 2018-10-26 DIAGNOSIS — I119 Hypertensive heart disease without heart failure: Secondary | ICD-10-CM | POA: Diagnosis not present

## 2018-10-26 DIAGNOSIS — I10 Essential (primary) hypertension: Secondary | ICD-10-CM | POA: Diagnosis not present

## 2018-10-26 DIAGNOSIS — I214 Non-ST elevation (NSTEMI) myocardial infarction: Secondary | ICD-10-CM | POA: Diagnosis not present

## 2018-10-26 DIAGNOSIS — E538 Deficiency of other specified B group vitamins: Secondary | ICD-10-CM | POA: Diagnosis not present

## 2018-10-26 NOTE — Patient Outreach (Signed)
[place] [facility]) [place]   10/26/2018  Robin Hudson August 21, 1941 [id number]  Reason for referral: 30 day post discharge medication review  Current insurance:HTA  PMHx:  Hypertension, COPD, h/o colon cancer and breast cancer, osteoporosis and hyperlipidemia  HPI:  Patient states that she is "real weak" after her procedure.  She states that she is ambulating and eating well.  She states that she has an appointment with her cardiologist today and will see her PCP in January.     Objective: Lab Results  Component Value Date   CREATININE 0.41 (L) 10/09/2018   CREATININE 0.87 10/08/2018   CREATININE 0.55 09/26/2018    Lab Results  Component Value Date   HGBA1C 5.8 12/06/2015    Lipid Panel  No results found for: CHOL, TRIG, HDL, CHOLHDL, VLDL, LDLCALC, LDLDIRECT  BP Readings from Last 3 Encounters:  10/09/18 (!) 101/51  09/26/18 (!) 129/58  09/14/18 (!) 114/52    Allergies  Allergen Reactions  . Ace Inhibitors     Other reaction(s): Cough  . Alendronate     Other reaction(s): Other (See Comments) GI UPSET  . Coreg [Carvedilol] Other (See Comments)    syncope  . Duloxetine Other (See Comments)  . Morphine Other (See Comments)    confusion  . Pregabalin     Other reaction(s): Other (See Comments) intolerant  . Statins Other (See Comments)  . Verapamil     Other reaction(s): Other (See Comments) CONSTIPATION    Medications Reviewed Today    Reviewed by Dionne Milo, [facility] (Pharmacist) on 10/26/18 at 1106  Med List Status: <None>  Medication Order Taking? Sig Documenting Provider Last Dose Status Informant  albuterol (PROAIR HFA) 108 (90 Base) MCG/ACT inhaler [id number] Yes Inhale 2 puffs into the lungs every 6 (six) hours as needed for wheezing or shortness of breath.  [provider] Taking Active Self  albuterol (PROVENTIL) (2.5 MG/3ML) 0.083% nebulizer solution [id number] Yes Take 2.5 mg by nebulization  every 6 (six) hours as needed for wheezing or shortness of breath. [provider] Taking Active Self  ALPRAZolam Duanne Moron) 0.5 MG tablet [id number] Yes Take 1 tablet by mouth 2 (two) times daily as needed for anxiety.  [provider] Taking Active Self           Med Note Troy Sine Dec 03, 2015  2:02 AM)    aspirin EC 81 MG tablet [id number] Yes Take 1 tablet (81 mg total) by mouth daily. Fritzi Mandes, MD Taking Active Self  atenolol (TENORMIN) 50 MG tablet [id number] Yes Take 0.5 tablets (25 mg total) by mouth daily. Fritzi Mandes, MD Taking Active   azelastine (ASTELIN) 0.1 % nasal spray [id number] Yes Place 1 spray into both nostrils 2 (two) times daily. Use in each nostril as directed [provider] Taking Active Self  butalbital-acetaminophen-caffeine (FIORICET, ESGIC) 50-325-40 MG tablet [id number] Yes Take 1 tablet by mouth every 6 (six) hours as needed for migraine.  [provider] Taking Active Self  cholecalciferol (VITAMIN D) 1000 units tablet [id number] Yes Take 1,000 Units by mouth daily. [provider] Taking Active Self  clopidogrel (PLAVIX) 75 MG tablet [id number] Yes Take 1 tablet (75 mg total) by mouth daily. Fritzi Mandes, MD Taking Active   escitalopram (LEXAPRO) 5 MG tablet [id number] Yes Take 5 mg by mouth daily. [provider] Taking Active Self  Fluticasone-Salmeterol (ADVAIR DISKUS) 250-50 MCG/DOSE AEPB [id number] Yes Inhale 1 puff into the lungs  2 (two) times daily.  [provider] Taking Active Self  HYDROcodone-acetaminophen (NORCO) 7.5-325 MG tablet [id number] Yes Take 1 tablet by mouth 3 (three) times daily as needed for moderate pain. [provider] Taking Active Self  hydroxychloroquine (PLAQUENIL) 200 MG tablet [id number] Yes Take 400 mg by mouth daily. [provider] Taking Active Self           Med Note (Nimrod Wendt, Kathlyn [place] Oct 26, 2018 11:06 AM) Confirmed with Dr.  Thayer Headings office that she should be on Plaquenil 400mg  daily.  Called into [facility] and pt aware to start taking.  losartan (COZAAR) 50 MG tablet [id number] Yes Take 1 tablet (50 mg total) by mouth daily. Adin Hector, MD Taking Active Self  nitroGLYCERIN (NITROSTAT) 0.4 MG SL tablet [id number] Yes Place 1 tablet (0.4 mg total) under the tongue every 5 (five) minutes as needed for chest pain. Fritzi Mandes, MD Taking Active           ASSESSMENT: Date Discharged from Hospital: 10/09/18 Date Medication Reconciliation Performed: 10/26/2018  Medications Discontinued at Discharge:   Levofloxacin  New Medications at Discharge:   Clopidogrel  Patient was recently discharged from hospital and all medications have been reviewed.  Drugs sorted by system:  Neurologic/Psychologic: alprazolam, escitalopram  Cardiovascular: aspirin 81 mg, atenolol, clopidogrel, losartan, nitroglycerin  Pulmonary/Allergy: albuterol MDI and nebs, fluticasone/salmetero MDI  Pain:butalbital/APAP, hydrocodone/APAP  Vitamins/Minerals/Supplements: cholecalciferol  Miscellaneous: Hydroxychloroquine  Medication Review Findings:  . Hydroxychlorquine- patient reports that she does not take this medication.  Verified with Dr. Lerry Paterson office that she should be taking this medication for her Rheumatoid Arthritis and made patient aware that she will need to pick up this prescription.  She verbalized understanding.  Requested office to call in her prescription to her [facility].  . Per the [employer], alprazolam may have decreased metabolism and increases sensitivity in older adults.  Risk of cognitive impairment, delirium, falls, fractures and motor vehicle accidents may occur. There is strong evidence to avoid use in the elderly.  . Completed Extra Help LIS application.   PLAN: Route note to PCP, Dr. Caryl Comes.  Outreach to Robin Hudson in 4-6 weeks to determine if approved for Extra Help LIS.  Joetta Manners, PharmD Clinical Pharmacist [place] [phone]

## 2018-10-27 ENCOUNTER — Other Ambulatory Visit: Payer: Self-pay

## 2018-10-27 NOTE — Patient Outreach (Signed)
[place] [facility]) Care Management  10/27/2018  Robin Hudson 1941-04-19 [id number]  Successful outreach call to [name].  HIPAA identifiers verified.  Ms. Helvie reports that she went to pick up her hydroxychloroquine from her pharmacy yesterday and it had not been filled.   Call placed to rheumatologist's office.  Office states that they have been trying to contact her to tell her she needs an appointment.  Appointment made for tomorrow at 10 am.  Joetta Manners, [place] [phone]

## 2018-11-03 DIAGNOSIS — J449 Chronic obstructive pulmonary disease, unspecified: Secondary | ICD-10-CM | POA: Diagnosis not present

## 2018-11-07 ENCOUNTER — Emergency Department: Payer: PPO

## 2018-11-07 ENCOUNTER — Emergency Department
Admission: EM | Admit: 2018-11-07 | Discharge: 2018-11-08 | Disposition: A | Discharge status: Home / Self Care | Payer: PPO | Attending: Emergency Medicine | Admitting: Emergency Medicine

## 2018-11-07 ENCOUNTER — Encounter: Payer: Self-pay | Admitting: Emergency Medicine

## 2018-11-07 DIAGNOSIS — I110 Hypertensive heart disease with heart failure: Secondary | ICD-10-CM | POA: Insufficient documentation

## 2018-11-07 DIAGNOSIS — F17210 Nicotine dependence, cigarettes, uncomplicated: Secondary | ICD-10-CM | POA: Diagnosis not present

## 2018-11-07 DIAGNOSIS — Z7982 Long term (current) use of aspirin: Secondary | ICD-10-CM | POA: Diagnosis not present

## 2018-11-07 DIAGNOSIS — R069 Unspecified abnormalities of breathing: Secondary | ICD-10-CM | POA: Diagnosis not present

## 2018-11-07 DIAGNOSIS — J449 Chronic obstructive pulmonary disease, unspecified: Secondary | ICD-10-CM | POA: Insufficient documentation

## 2018-11-07 DIAGNOSIS — I509 Heart failure, unspecified: Secondary | ICD-10-CM | POA: Insufficient documentation

## 2018-11-07 DIAGNOSIS — J209 Acute bronchitis, unspecified: Secondary | ICD-10-CM | POA: Diagnosis not present

## 2018-11-07 DIAGNOSIS — I4891 Unspecified atrial fibrillation: Secondary | ICD-10-CM | POA: Diagnosis not present

## 2018-11-07 DIAGNOSIS — R0602 Shortness of breath: Secondary | ICD-10-CM | POA: Diagnosis not present

## 2018-11-07 DIAGNOSIS — Z79899 Other long term (current) drug therapy: Secondary | ICD-10-CM | POA: Insufficient documentation

## 2018-11-07 DIAGNOSIS — R062 Wheezing: Secondary | ICD-10-CM | POA: Diagnosis not present

## 2018-11-07 DIAGNOSIS — I10 Essential (primary) hypertension: Secondary | ICD-10-CM | POA: Diagnosis not present

## 2018-11-07 LAB — CBC
HCT: 35.5 % — ABNORMAL LOW (ref 36.0–46.0)
Hemoglobin: 11.2 g/dL — ABNORMAL LOW (ref 12.0–15.0)
MCH: 28.5 pg (ref 26.0–34.0)
MCHC: 31.5 g/dL (ref 30.0–36.0)
MCV: 90.3 fL (ref 80.0–100.0)
Platelets: 281 10*3/uL (ref 150–400)
RBC: 3.93 MIL/uL (ref 3.87–5.11)
RDW: 14.2 % (ref 11.5–15.5)
WBC: 10.6 10*3/uL — ABNORMAL HIGH (ref 4.0–10.5)
nRBC: 0 % (ref 0.0–0.2)

## 2018-11-07 LAB — INFLUENZA PANEL BY PCR (TYPE A & B)
Influenza A By PCR: NEGATIVE
Influenza B By PCR: NEGATIVE

## 2018-11-07 LAB — BASIC METABOLIC PANEL
Anion gap: 7 (ref 5–15)
BUN: 10 mg/dL (ref 8–23)
CALCIUM: 9.1 mg/dL (ref 8.9–10.3)
CO2: 31 mmol/L (ref 22–32)
CREATININE: 0.49 mg/dL (ref 0.44–1.00)
Chloride: 98 mmol/L (ref 98–111)
GFR calc Af Amer: >60 mL/min (ref >60)
GFR calc non Af Amer: >60 mL/min (ref >60)
Glucose, Bld: 133 mg/dL — ABNORMAL HIGH (ref 70–99)
Potassium: 3.4 mmol/L — ABNORMAL LOW (ref 3.5–5.1)
Sodium: 136 mmol/L (ref 135–145)

## 2018-11-07 LAB — TROPONIN I: Troponin I: <0.03 ng/mL (ref <0.03)

## 2018-11-07 MED ORDER — PREDNISONE 20 MG PO TABS: 40.0000 mg | ORAL_TABLET | Freq: Every day | ORAL | 0 refills | Status: AC

## 2018-11-07 MED ORDER — METHYLPREDNISOLONE SODIUM SUCC 125 MG IJ SOLR
125.0000 mg | Freq: Once | INTRAMUSCULAR | Status: AC
  Administered 2018-11-07: 125 mg via INTRAVENOUS
  Filled 2018-11-07: qty 2

## 2018-11-07 MED ORDER — ALBUTEROL SULFATE (2.5 MG/3ML) 0.083% IN NEBU
2.5000 mg | INHALATION_SOLUTION | Freq: Once | RESPIRATORY_TRACT | Status: AC
  Administered 2018-11-07: 2.5 mg via RESPIRATORY_TRACT
  Filled 2018-11-07: qty 3

## 2018-11-07 NOTE — ED Triage Notes (Signed)
Patient brought in by ems from home. Patient with complaint of shortness of breath that started this morning that became worse throughout the day. Patient states that she has used 3 breathing treatments without improvement.

## 2018-11-07 NOTE — ED Provider Notes (Signed)
[facility] Emergency Department Provider Note ____________________________________________   First MD Initiated Contact with Patient 11/07/18 2150     (approximate)  I have reviewed the triage vital signs and the nursing notes.   HISTORY  Chief Complaint Shortness of Breath    HPI Robin Hudson is a 77 y.o. female with PMH as noted below who presents with shortness of breath, acute onset this morning, persistent course throughout the day, and not relieved by 3 albuterol treatments at home.  She was brought in by EMS and states that she got additional breathing treatments by EMS and started to feel better after this.  She states that she now feels significantly better.  She reports some cough but denies chest pain or fever.  Past Medical History:  Diagnosis Date  . Allergic rhinitis   . Anxiety   . Asthma   . B12 deficiency   . Breast cancer ([place]) 1999   RT LUMPECTOMY  . Carcinoma of right breast treated with adjuvant chemotherapy ([place]) 1999   RT LUMPECTOMY  . CHF (congestive heart failure) ([place])   . Colon cancer ([place]) 2010  . Colon cancer ([place])   . COPD (chronic obstructive pulmonary disease) ([place])   . Hyperlipemia   . Hypertension   . MRSA pneumonia ([place])   . Osteoporosis   . Radiation 1999   BREAST CA  . Right adrenal mass [facility])     Patient Active Problem List   Diagnosis Date Noted  . Chest pain 10/08/2018  . COPD with acute exacerbation ([place]) 09/23/2018  . Acute respiratory failure with hypoxia ([place]) 09/14/2018  . Acute hypoxemic respiratory failure ([place]) 09/13/2018  . Acute exacerbation of chronic obstructive pulmonary disease (COPD) ([place]) 08/08/2018  . Leukocytosis 12/29/2016  . Cough 12/29/2016  . Hypoxia 12/06/2015  . COPD exacerbation ([place]) 12/03/2015  . Elevated troponin 12/03/2015  . Elevated antinuclear antibody (ANA) level 05/15/2015  . Shortness of breath 05/12/2015  . Hyperlipemia 05/12/2015  . COPD (chronic  obstructive pulmonary disease) ([place]) 05/12/2015  . HTN (hypertension) 05/12/2015  . Osteoporosis 05/12/2015  . Right adrenal mass ([place]) 05/12/2015  . B12 deficiency 05/12/2015  . Allergic rhinitis 05/12/2015  . Breast cancer ([place]) 05/12/2015  . Colon cancer ([place]) 05/12/2015  . Closed head injury 12/15/2014    Past Surgical History:  Procedure Laterality Date  . ABDOMINAL HYSTERECTOMY    . APPENDECTOMY    . BREAST SURGERY Right    lumpectomy x2 with lymph nodes  . CARPAL TUNNEL RELEASE Left 06/04/2016   Procedure: CARPAL TUNNEL RELEASE;  Surgeon: Hessie Knows, MD;  Location: [facility] ORS;  Service: Orthopedics;  Laterality: Left;  . colectomy     partial  . IMAGE GUIDED SINUS SURGERY    . LEFT HEART CATH AND CORONARY ANGIOGRAPHY N/A 10/08/2018   Procedure: LEFT HEART CATH AND CORONARY ANGIOGRAPHY;  Surgeon: Corey Skains, MD;  Location: [facility];  Service: Cardiovascular;  Laterality: N/A;  . OPEN REDUCTION INTERNAL FIXATION (ORIF) DISTAL RADIAL FRACTURE Left 06/04/2016   Procedure: OPEN REDUCTION INTERNAL FIXATION (ORIF) DISTAL RADIAL FRACTURE;  Surgeon: Hessie Knows, MD;  Location: [facility] ORS;  Service: Orthopedics;  Laterality: Left;    Prior to Admission medications   Medication Sig Start Date End Date Taking? Authorizing Provider  albuterol (PROAIR HFA) 108 (90 Base) MCG/ACT inhaler Inhale 2 puffs into the lungs every 6 (six) hours as needed for wheezing or shortness of breath.  10/31/17  Yes [provider]  albuterol (  PROVENTIL) (2.5 MG/3ML) 0.083% nebulizer solution Take 2.5 mg by nebulization every 6 (six) hours as needed for wheezing or shortness of breath.   Yes [provider]  ALPRAZolam Duanne Moron) 0.5 MG tablet Take 1 tablet by mouth 2 (two) times daily as needed for anxiety.  04/19/15  Yes [provider]  aspirin EC 81 MG tablet Take 1 tablet (81 mg total) by mouth daily. 06/28/16  Yes Fritzi Mandes, MD  atenolol (TENORMIN) 50 MG tablet Take  0.5 tablets (25 mg total) by mouth daily. 10/09/18  Yes Fritzi Mandes, MD  azelastine (ASTELIN) 0.1 % nasal spray Place 1 spray into both nostrils 2 (two) times daily. Use in each nostril as directed   Yes [provider]  butalbital-acetaminophen-caffeine (FIORICET, ESGIC) 50-325-40 MG tablet Take 1 tablet by mouth every 6 (six) hours as needed for migraine.  12/19/17  Yes [provider]  cholecalciferol (VITAMIN D) 1000 units tablet Take 1,000 Units by mouth daily.   Yes [provider]  clopidogrel (PLAVIX) 75 MG tablet Take 1 tablet (75 mg total) by mouth daily. 10/10/18  Yes Fritzi Mandes, MD  escitalopram (LEXAPRO) 5 MG tablet Take 5 mg by mouth daily.   Yes [provider]  Fluticasone-Salmeterol (ADVAIR DISKUS) 250-50 MCG/DOSE AEPB Inhale 1 puff into the lungs 2 (two) times daily.  12/22/17  Yes [provider]  HYDROcodone-acetaminophen (NORCO) 7.5-325 MG tablet Take 1 tablet by mouth 3 (three) times daily as needed for moderate pain.   Yes [provider]  hydroxychloroquine (PLAQUENIL) 200 MG tablet Take 400 mg by mouth daily.   Yes [provider]  losartan (COZAAR) 50 MG tablet Take 1 tablet (50 mg total) by mouth daily. 05/18/15  Yes Tama High III, MD  nitroGLYCERIN (NITROSTAT) 0.4 MG SL tablet Place 1 tablet (0.4 mg total) under the tongue every 5 (five) minutes as needed for chest pain. 10/09/18  Yes Fritzi Mandes, MD  predniSONE (DELTASONE) 20 MG tablet Take 2 tablets (40 mg total) by mouth daily for 4 days. 11/08/18 11/12/18  Arta Silence, MD    Allergies Ace inhibitors; Alendronate; Coreg [carvedilol]; Duloxetine; Morphine; Pregabalin; Statins; and Verapamil  Family History  Problem Relation Age of Onset  . Heart attack Mother   . Breast cancer Other   . Breast cancer Sister 75  . Breast cancer Sister 12    Social History Social History   Tobacco Use  . Smoking status: Current Some Day Smoker     Packs/day: 0.25    Types: Cigarettes  . Smokeless tobacco: Never Used  Substance Use Topics  . Alcohol use: No  . Drug use: No    Review of Systems  Constitutional: No fever. Eyes: No redness. ENT: No sore throat. Cardiovascular: Denies chest pain. Respiratory: Positive for shortness of breath. Gastrointestinal: No vomiting or diarrhea.  Genitourinary: Negative for flank pain.  Musculoskeletal: Negative for back pain. Skin: Negative for rash. Neurological: Negative for headache.   ____________________________________________   PHYSICAL EXAM:  VITAL SIGNS: ED Triage Vitals  Enc Vitals Group     BP 11/07/18 2108 (!) 153/83     Pulse Rate 11/07/18 2108 100     Resp 11/07/18 2108 (!) 26     Temp 11/07/18 2108 98 F (36.7 C)     Temp Source 11/07/18 2108 Oral     SpO2 11/07/18 2108 94 %     Weight 11/07/18 2106 90 lb (40.8 kg)     Height 11/07/18 2106 5'  4" (1.626 m)     Head Circumference --      Peak Flow --      Pain Score 11/07/18 2105 6     Pain Loc --      Pain Edu? --      Excl. in [place]? --     Constitutional: Alert and oriented.  Somewhat frail and weak appearing but in no acute distress. Eyes: Conjunctivae are normal.  Head: Atraumatic. Nose: No congestion/rhinnorhea. Mouth/Throat: Mucous membranes are slightly dry.   Neck: Normal range of motion.  Cardiovascular: Normal rate, regular rhythm. Grossly normal heart sounds.  Good peripheral circulation. Respiratory: Normal respiratory effort.  No retractions. Lungs with faint scattered wheezes. Gastrointestinal: Soft and nontender. No distention.  Genitourinary: No flank tenderness. Musculoskeletal: No lower extremity edema.  Extremities warm and well perfused.  Neurologic:  Normal speech and language. No gross focal neurologic deficits are appreciated.  Skin:  Skin is warm and dry. No rash noted. Psychiatric: Mood and affect are normal. Speech and behavior are  normal.  ____________________________________________   LABS (all labs ordered are listed, but only abnormal results are displayed)  Labs Reviewed  BASIC METABOLIC PANEL - Abnormal; Notable for the following components:      Result Value   Potassium 3.4 (*)    Glucose, Bld 133 (*)    All other components within normal limits  CBC - Abnormal; Notable for the following components:   WBC 10.6 (*)    Hemoglobin 11.2 (*)    HCT 35.5 (*)    All other components within normal limits  TROPONIN I  INFLUENZA PANEL BY PCR (TYPE A & B)   ____________________________________________  EKG  ED ECG REPORT I, Arta Silence, the attending physician, personally viewed and interpreted this ECG.  Date: 11/08/2018 EKG Time: 2109 Rate: 100 Rhythm: normal sinus rhythm QRS Axis: normal Intervals: normal ST/T Wave abnormalities: LVH with repolarization abnormality Narrative Interpretation: Poor quality baseline but no evidence of acute ischemia; no significant change when compared to EKG of 10/09/2018  ____________________________________________  RADIOLOGY  CXR: Bronchitis with no focal infiltrate ____________________________________________   PROCEDURES  Procedure(s) performed: No  Procedures  Critical Care performed: No ____________________________________________   INITIAL IMPRESSION / ASSESSMENT AND PLAN / ED COURSE  Pertinent labs & imaging results that were available during my care of the patient were reviewed by me and considered in my medical decision making (see chart for details).  77 year old female with history of COPD and other PMH as noted below presented with shortness of breath not relieved by albuterol at home.  She states that her symptoms started today.  The patient is on home O2 at night only.  She was not noted to be hypoxic by EMS.  On exam, the patient is somewhat frail appearing but in no acute distress.  Her lungs are mostly clear with some scattered  wheezes.  Her respiratory effort is not significantly increased.  The remainder of the exam is as described above.  EKG is nonischemic.  The patient states she has been feeling better ever since she got albuterol by EMS.  She feels well enough to go home.  The lab work-up is unremarkable.  Troponin is negative.  Given the lack of chest pain or EKG changes I do not think that there is any indication for repeat.  Chest x-ray shows no focal infiltrate.  Presentation is consistent with COPD exacerbation or acute bronchitis.  I suggested to the patient that we obtain a flu swab  and give a dose of Solu-Medrol.  She agreed with this plan.  Flu is negative and the patient continued to feel well and wanted to go home.  I counseled her on the results of the work-up.  I will give a prescription for prednisone and she agrees to continue her albuterol every 4-6 hours for the next several days.  Return precautions given, and she expressed understanding. ____________________________________________   FINAL CLINICAL IMPRESSION(S) / ED DIAGNOSES  Final diagnoses:  Acute bronchitis, unspecified organism      NEW MEDICATIONS STARTED DURING THIS VISIT:  Discharge Medication List as of 11/07/2018 11:37 PM       Note:  This document was prepared using Dragon voice recognition software and may include unintentional dictation errors.   Arta Silence, MD 11/08/18 [phone]

## 2018-11-07 NOTE — ED Notes (Signed)
ED Provider at bedside. 

## 2018-11-07 NOTE — Discharge Instructions (Addendum)
Use your albuterol nebulizer every 4-6 hours for the next several days.  Take the prednisone as prescribed for 4 days starting tomorrow afternoon.  Return to the ER for new, worsening, persistent severe shortness of breath, cough, fever, or any other new or worsening symptoms that concern you.

## 2018-11-30 ENCOUNTER — Other Ambulatory Visit: Payer: Self-pay

## 2018-11-30 NOTE — Patient Outreach (Signed)
[facility]) Care Management  11/30/2018  CHASELYN NANNEY 09/17/1941 [id number]  Successful outreach call to Ms. Wetherbee.  HIPAA identifier verified.  Ms. Petitjean states that she is experiencing back pain today from her degenerative disc disease.  She states that she was declined for [facility].  Tried to complete a medication review, but she requested that I call her back later this week.  From the conversation, she was unclear if she kept her rheumatology appointment that I helped schedule for her on 12/11.  She states that if someone calls and caller ID does not show who is calling that she does not answer her phone.   Confirmed with Rheumatology office that she did not keep her appointment.   Plan: Informed her that I will call her later this week to complete a medication review and see if she would like assistance rescheduling appointment.   Joetta Manners, PharmD Clinical Pharmacist [place] [phone]

## 2018-12-02 DIAGNOSIS — M5136 Other intervertebral disc degeneration, lumbar region: Secondary | ICD-10-CM | POA: Diagnosis not present

## 2018-12-02 DIAGNOSIS — M62838 Other muscle spasm: Secondary | ICD-10-CM | POA: Diagnosis not present

## 2018-12-02 DIAGNOSIS — M5030 Other cervical disc degeneration, unspecified cervical region: Secondary | ICD-10-CM | POA: Diagnosis not present

## 2018-12-02 DIAGNOSIS — M5416 Radiculopathy, lumbar region: Secondary | ICD-10-CM | POA: Diagnosis not present

## 2018-12-02 DIAGNOSIS — M5412 Radiculopathy, cervical region: Secondary | ICD-10-CM | POA: Diagnosis not present

## 2018-12-03 ENCOUNTER — Ambulatory Visit: Payer: Self-pay

## 2018-12-03 ENCOUNTER — Other Ambulatory Visit: Payer: Self-pay

## 2018-12-03 NOTE — Patient Outreach (Signed)
[facility]) Care Management [place]  12/03/2018  BETTYJO LUNDBLAD 10/13/1941 [id number]  Successful outreach to Ms. Kristiansen.  She states that she is not interested in [facility] services at this time.  [facility] pharmacy case is being closed due to the following reasons:  The patient has chosen to discontinue services.  Patient has been provided [facility] CM contact information if assistance needed in the future.    Thank you for allowing [facility] pharmacy to be involved in this patient's care.    Joetta Manners, PharmD Clinical Pharmacist [place] [phone]

## 2018-12-04 DIAGNOSIS — J449 Chronic obstructive pulmonary disease, unspecified: Secondary | ICD-10-CM | POA: Diagnosis not present

## 2018-12-09 ENCOUNTER — Other Ambulatory Visit: Payer: Self-pay

## 2018-12-09 ENCOUNTER — Observation Stay
Admission: EM | Admit: 2018-12-09 | Discharge: 2018-12-11 | Disposition: A | Discharge status: Home / Self Care | Payer: PPO | Attending: Internal Medicine | Admitting: Internal Medicine

## 2018-12-09 DIAGNOSIS — I5022 Chronic systolic (congestive) heart failure: Secondary | ICD-10-CM | POA: Insufficient documentation

## 2018-12-09 DIAGNOSIS — F419 Anxiety disorder, unspecified: Secondary | ICD-10-CM | POA: Insufficient documentation

## 2018-12-09 DIAGNOSIS — R069 Unspecified abnormalities of breathing: Secondary | ICD-10-CM | POA: Diagnosis not present

## 2018-12-09 DIAGNOSIS — J984 Other disorders of lung: Secondary | ICD-10-CM | POA: Diagnosis not present

## 2018-12-09 DIAGNOSIS — R079 Chest pain, unspecified: Secondary | ICD-10-CM | POA: Diagnosis not present

## 2018-12-09 DIAGNOSIS — J9622 Acute and chronic respiratory failure with hypercapnia: Secondary | ICD-10-CM | POA: Insufficient documentation

## 2018-12-09 DIAGNOSIS — E785 Hyperlipidemia, unspecified: Secondary | ICD-10-CM | POA: Diagnosis not present

## 2018-12-09 DIAGNOSIS — I2510 Atherosclerotic heart disease of native coronary artery without angina pectoris: Secondary | ICD-10-CM | POA: Insufficient documentation

## 2018-12-09 DIAGNOSIS — Z85038 Personal history of other malignant neoplasm of large intestine: Secondary | ICD-10-CM | POA: Diagnosis not present

## 2018-12-09 DIAGNOSIS — M810 Age-related osteoporosis without current pathological fracture: Secondary | ICD-10-CM | POA: Diagnosis not present

## 2018-12-09 DIAGNOSIS — Z79899 Other long term (current) drug therapy: Secondary | ICD-10-CM | POA: Insufficient documentation

## 2018-12-09 DIAGNOSIS — E538 Deficiency of other specified B group vitamins: Secondary | ICD-10-CM | POA: Diagnosis not present

## 2018-12-09 DIAGNOSIS — I110 Hypertensive heart disease with heart failure: Secondary | ICD-10-CM | POA: Insufficient documentation

## 2018-12-09 DIAGNOSIS — J441 Chronic obstructive pulmonary disease with (acute) exacerbation: Secondary | ICD-10-CM | POA: Diagnosis not present

## 2018-12-09 DIAGNOSIS — R000 Tachycardia, unspecified: Secondary | ICD-10-CM | POA: Diagnosis not present

## 2018-12-09 DIAGNOSIS — L8990 Pressure ulcer of unspecified site, unspecified stage: Secondary | ICD-10-CM

## 2018-12-09 DIAGNOSIS — Z853 Personal history of malignant neoplasm of breast: Secondary | ICD-10-CM | POA: Insufficient documentation

## 2018-12-09 DIAGNOSIS — Z7982 Long term (current) use of aspirin: Secondary | ICD-10-CM | POA: Insufficient documentation

## 2018-12-09 DIAGNOSIS — J9621 Acute and chronic respiratory failure with hypoxia: Secondary | ICD-10-CM | POA: Insufficient documentation

## 2018-12-09 DIAGNOSIS — Z7951 Long term (current) use of inhaled steroids: Secondary | ICD-10-CM | POA: Diagnosis not present

## 2018-12-09 DIAGNOSIS — R0602 Shortness of breath: Secondary | ICD-10-CM | POA: Diagnosis not present

## 2018-12-09 DIAGNOSIS — R0689 Other abnormalities of breathing: Secondary | ICD-10-CM | POA: Diagnosis not present

## 2018-12-09 DIAGNOSIS — R0902 Hypoxemia: Secondary | ICD-10-CM | POA: Diagnosis not present

## 2018-12-09 DIAGNOSIS — Z8249 Family history of ischemic heart disease and other diseases of the circulatory system: Secondary | ICD-10-CM | POA: Insufficient documentation

## 2018-12-09 DIAGNOSIS — I10 Essential (primary) hypertension: Secondary | ICD-10-CM | POA: Diagnosis not present

## 2018-12-09 DIAGNOSIS — E43 Unspecified severe protein-calorie malnutrition: Secondary | ICD-10-CM

## 2018-12-09 DIAGNOSIS — F17210 Nicotine dependence, cigarettes, uncomplicated: Secondary | ICD-10-CM | POA: Diagnosis not present

## 2018-12-09 DIAGNOSIS — R0781 Pleurodynia: Secondary | ICD-10-CM | POA: Diagnosis not present

## 2018-12-09 DIAGNOSIS — Z7902 Long term (current) use of antithrombotics/antiplatelets: Secondary | ICD-10-CM | POA: Diagnosis not present

## 2018-12-09 NOTE — ED Triage Notes (Signed)
Pt BIB EMS from home for SOB, HX of COPD normally on 2 L, found to be 89% on 2L. Given 1 duo neb, 1 albuterol neb and 125 solumederol. 99% on 2L [place] at this time. HR 122. Reports cough that is productive at times, clear sputum, afebrile.

## 2018-12-09 NOTE — ED Provider Notes (Addendum)
[facility] Emergency Department Provider Note  ____________________________________________   First MD Initiated Contact with Patient 12/09/18 2350     (approximate)  I have reviewed the triage vital signs and the nursing notes.   HISTORY  Chief Complaint Shortness of Breath    HPI Robin Hudson is a 78 y.o. female with a history of severe COPD and frequent emergency department visits for COPD exacerbations.  She presents by EMS tonight with acute onset shortness of breath earlier today that has gradually worsened throughout the day and is now severe.  She uses 2 L of oxygen at baseline and EMS found that she was at about 89% on 2 L.  She says that she had used several breathing treatments over the course of the day but they were not helping.  She is also had some pain in the upper part of her back.  She denies chest pain.  She has had some chills recently and subjective fever.  She has been wheezing.  She denies nausea, vomiting, abdominal pain.  Symptoms are similar but worse than she has experienced previously.  Nothing makes it better and exertion makes her shortness of breath worse.  Prior to arrival, the paramedics provided her with Solu-Medrol 125 mg IV and a DuoNeb treatment.  Past Medical History:  Diagnosis Date  . Allergic rhinitis   . Anxiety   . Asthma   . B12 deficiency   . Breast cancer ([place]) 1999   RT LUMPECTOMY  . Carcinoma of right breast treated with adjuvant chemotherapy ([place]) 1999   RT LUMPECTOMY  . CHF (congestive heart failure) ([place])   . Colon cancer ([place]) 2010  . Colon cancer ([place])   . COPD (chronic obstructive pulmonary disease) ([place])   . Hyperlipemia   . Hypertension   . MRSA pneumonia ([place])   . Osteoporosis   . Radiation 1999   BREAST CA  . Right adrenal mass [facility])     Patient Active Problem List   Diagnosis Date Noted  . Chest pain 10/08/2018  . COPD with acute exacerbation ([place]) 09/23/2018  . Acute respiratory  failure with hypoxia ([place]) 09/14/2018  . Acute hypoxemic respiratory failure ([place]) 09/13/2018  . Acute exacerbation of chronic obstructive pulmonary disease (COPD) ([place]) 08/08/2018  . Leukocytosis 12/29/2016  . Cough 12/29/2016  . Hypoxia 12/06/2015  . COPD exacerbation ([place]) 12/03/2015  . Elevated troponin 12/03/2015  . Elevated antinuclear antibody (ANA) level 05/15/2015  . Shortness of breath 05/12/2015  . Hyperlipemia 05/12/2015  . COPD (chronic obstructive pulmonary disease) ([place]) 05/12/2015  . HTN (hypertension) 05/12/2015  . Osteoporosis 05/12/2015  . Right adrenal mass ([place]) 05/12/2015  . B12 deficiency 05/12/2015  . Allergic rhinitis 05/12/2015  . Breast cancer ([place]) 05/12/2015  . Colon cancer ([place]) 05/12/2015  . Closed head injury 12/15/2014    Past Surgical History:  Procedure Laterality Date  . ABDOMINAL HYSTERECTOMY    . APPENDECTOMY    . BREAST SURGERY Right    lumpectomy x2 with lymph nodes  . CARPAL TUNNEL RELEASE Left 06/04/2016   Procedure: CARPAL TUNNEL RELEASE;  Surgeon: Hessie Knows, MD;  Location: [facility] ORS;  Service: Orthopedics;  Laterality: Left;  . colectomy     partial  . IMAGE GUIDED SINUS SURGERY    . LEFT HEART CATH AND CORONARY ANGIOGRAPHY N/A 10/08/2018   Procedure: LEFT HEART CATH AND CORONARY ANGIOGRAPHY;  Surgeon: Corey Skains, MD;  Location: [facility] LAB;  Service: Cardiovascular;  Laterality: N/A;  .  OPEN REDUCTION INTERNAL FIXATION (ORIF) DISTAL RADIAL FRACTURE Left 06/04/2016   Procedure: OPEN REDUCTION INTERNAL FIXATION (ORIF) DISTAL RADIAL FRACTURE;  Surgeon: Hessie Knows, MD;  Location: [facility] ORS;  Service: Orthopedics;  Laterality: Left;    Prior to Admission medications   Medication Sig Start Date End Date Taking? Authorizing Provider  albuterol (PROAIR HFA) 108 (90 Base) MCG/ACT inhaler Inhale 2 puffs into the lungs every 6 (six) hours as needed for wheezing or shortness of breath.  10/31/17   [provider]    albuterol (PROVENTIL) (2.5 MG/3ML) 0.083% nebulizer solution Take 2.5 mg by nebulization every 6 (six) hours as needed for wheezing or shortness of breath.    [provider]  ALPRAZolam Duanne Moron) 0.5 MG tablet Take 1 tablet by mouth 2 (two) times daily as needed for anxiety.  04/19/15   [provider]  aspirin EC 81 MG tablet Take 1 tablet (81 mg total) by mouth daily. 06/28/16   Fritzi Mandes, MD  atenolol (TENORMIN) 50 MG tablet Take 0.5 tablets (25 mg total) by mouth daily. 10/09/18   Fritzi Mandes, MD  azelastine (ASTELIN) 0.1 % nasal spray Place 1 spray into both nostrils 2 (two) times daily. Use in each nostril as directed    [provider]  butalbital-acetaminophen-caffeine (FIORICET, ESGIC) 50-325-40 MG tablet Take 1 tablet by mouth every 6 (six) hours as needed for migraine.  12/19/17   [provider]  cholecalciferol (VITAMIN D) 1000 units tablet Take 1,000 Units by mouth daily.    [provider]  clopidogrel (PLAVIX) 75 MG tablet Take 1 tablet (75 mg total) by mouth daily. 10/10/18   Fritzi Mandes, MD  escitalopram (LEXAPRO) 5 MG tablet Take 5 mg by mouth daily.    [provider]  Fluticasone-Salmeterol (ADVAIR DISKUS) 250-50 MCG/DOSE AEPB Inhale 1 puff into the lungs 2 (two) times daily.  12/22/17   [provider]  HYDROcodone-acetaminophen (NORCO) 7.5-325 MG tablet Take 1 tablet by mouth 3 (three) times daily as needed for moderate pain.    [provider]  hydroxychloroquine (PLAQUENIL) 200 MG tablet Take 400 mg by mouth daily.    [provider]  losartan (COZAAR) 50 MG tablet Take 1 tablet (50 mg total) by mouth daily. 05/18/15   Tama High III, MD  nitroGLYCERIN (NITROSTAT) 0.4 MG SL tablet Place 1 tablet (0.4 mg total) under the tongue every 5 (five) minutes as needed for chest pain. 10/09/18   Fritzi Mandes, MD    Allergies Ace inhibitors; Alendronate; Coreg [carvedilol]; Duloxetine; Morphine; Pregabalin;  Statins; and Verapamil  Family History  Problem Relation Age of Onset  . Heart attack Mother   . Breast cancer Other   . Breast cancer Sister 68  . Breast cancer Sister 89    Social History Social History   Tobacco Use  . Smoking status: Current Some Day Smoker    Packs/day: 0.25    Types: Cigarettes  . Smokeless tobacco: Never Used  Substance Use Topics  . Alcohol use: No  . Drug use: No    Review of Systems Constitutional: Subjective fever and chills. Eyes: No visual changes. ENT: No sore throat. Cardiovascular: Denies chest pain. Respiratory: Severe shortness of breath gradually worsening over the course of the day as described above Gastrointestinal: No abdominal pain.  No nausea, no vomiting.  No diarrhea.  No constipation. Genitourinary: Negative for dysuria. Musculoskeletal: Negative for neck pain.  Pain in her upper back. Integumentary: Negative for rash. Neurological: Negative for headaches,  focal weakness or numbness.   ____________________________________________   PHYSICAL EXAM:  VITAL SIGNS: ED Triage Vitals  Enc Vitals Group     BP 12/09/18 2344 (!) 152/85     Pulse Rate 12/09/18 2344 (!) 117     Resp 12/09/18 2344 (!) 34     Temp 12/09/18 2344 98.1 F (36.7 C)     Temp Source 12/09/18 2344 Oral     SpO2 12/09/18 2337 (!) 89 %     Weight 12/09/18 2346 45.4 kg (100 lb)     Height 12/09/18 2346 1.651 m (5\' 5" )     Head Circumference --      Peak Flow --      Pain Score 12/09/18 2345 7     Pain Loc --      Pain Edu? --      Excl. in [place]? --     Constitutional: Alert and oriented.  Appears chronically ill with acute exacerbation and respiratory distress. Eyes: Conjunctivae are normal.  Head: Atraumatic. Nose: No congestion/rhinnorhea. Mouth/Throat: Mucous membranes are dry. Neck: No stridor.  No meningeal signs.   Cardiovascular: Tachycardia, regular rhythm. Good peripheral circulation. Grossly normal heart sounds. Respiratory: Increased  respiratory effort with accessory muscle usage and intercostal muscle retractions, decreased air movement throughout with expiratory wheezing, crackles throughout lung fields.  Speaking a few words at a time before she has to take a deep breath. Gastrointestinal: Cachectic.  Soft and nontender. No distention.  Musculoskeletal: No lower extremity tenderness nor edema. No gross deformities of extremities. Neurologic:  Normal speech and language. No gross focal neurologic deficits are appreciated.  Skin:  Skin is warm, dry and intact. No rash noted.   ____________________________________________   LABS (all labs ordered are listed, but only abnormal results are displayed)  Labs Reviewed  BLOOD GAS, VENOUS - Abnormal; Notable for the following components:      Result Value   pCO2, Ven 76 (*)    pO2, Ven <31.0 (*)    Bicarbonate 40.1 (*)    Acid-Base Excess 10.8 (*)    All other components within normal limits  COMPREHENSIVE METABOLIC PANEL - Abnormal; Notable for the following components:   Chloride 97 (*)    CO2 36 (*)    Glucose, Bld 119 (*)    Calcium 8.7 (*)    Anion gap 4 (*)    All other components within normal limits  CBC WITH DIFFERENTIAL/PLATELET - Abnormal; Notable for the following components:   WBC 14.5 (*)    Hemoglobin 11.5 (*)    Neutro Abs 11.0 (*)    Monocytes Absolute 1.2 (*)    Abs Immature Granulocytes 0.08 (*)    All other components within normal limits  TROPONIN I  LIPASE, BLOOD  MAGNESIUM  BRAIN NATRIURETIC PEPTIDE  INFLUENZA PANEL BY PCR (TYPE A & B)   ____________________________________________  EKG  ED ECG REPORT I, Hinda Kehr, the attending physician, personally viewed and interpreted this ECG.  Date: 12/09/2018 EKG Time: 23: 39 Rate: 116 Rhythm: Sinus tachycardia QRS Axis: normal Intervals: LVH, prolonged QTC at 638 ms ST/T Wave abnormalities: Some inverted T waves particularly notable in the inferior and lateral leads. Narrative  Interpretation: Possible ischemia, but appears very similar to the EKG obtained about 1 month ago during the most recent ED visit.   ____________________________________________  RADIOLOGY I, Hinda Kehr, personally viewed and evaluated these images (plain radiographs) as part of my medical decision making, as well as reviewing the written report by the  radiologist.  ED MD interpretation:  Chronic lung disease  Official radiology report(s): Dg Chest Port 1 View  Result Date: 12/10/2018 CLINICAL DATA:  Severe shortness of breath EXAM: PORTABLE CHEST 1 VIEW COMPARISON:  11/07/2018, CT 10/30/2017, 08/08/2018 FINDINGS: Hyperinflation with emphysematous disease. Right greater than left apical pleural and parenchymal scarring with hilar retraction as before. No acute airspace disease. Stable cardiomediastinal silhouette with aortic atherosclerosis. No pneumothorax. Bronchiectasis in the upper lobes. IMPRESSION: Similar appearance of right greater than left biapical pleural and parenchymal scarring with retraction of the pulmonary hila. No acute airspace disease. Electronically Signed   By: Donavan Foil M.D.   On: 12/10/2018 00:26    ____________________________________________   PROCEDURES  Critical Care performed: Yes, see critical care procedure note(s)   Procedure(s) performed:   .Critical Care Performed by: Hinda Kehr, MD Authorized by: Hinda Kehr, MD   Critical care provider statement:    Critical care time (minutes):  30   Critical care time was exclusive of:  Separately billable procedures and treating other patients   Critical care was necessary to treat or prevent imminent or life-threatening deterioration of the following conditions:  Respiratory failure   Critical care was time spent personally by me on the following activities:  Development of treatment plan with patient or surrogate, discussions with consultants, evaluation of patient's response to treatment,  examination of patient, obtaining history from patient or surrogate, ordering and performing treatments and interventions, ordering and review of laboratory studies, ordering and review of radiographic studies, pulse oximetry, re-evaluation of patient's condition and review of old charts     ____________________________________________   INITIAL IMPRESSION / ASSESSMENT AND PLAN / ED COURSE  As part of my medical decision making, I reviewed the following data within the electronic MEDICAL RECORD NUMBER History obtained from family, Nursing notes reviewed and incorporated, Labs reviewed , EKG interpreted , old EKG reviewed, Old chart reviewed, Radiograph reviewed , Discussed with admitting physician  and Notes from prior ED visits    Differential diagnosis includes, but is not limited to, COPD exacerbation, influenza or other respiratory virus, pneumonia, pneumothorax, PE, ACS.  Her EKG shows some signs of ischemia and I believe this is likely secondary to her respiratory effort, but the EKG also does look similar to the EKG from a month ago.  She is not having any chest pain and I think the back pain is likely related to her respiratory distress although it is unclear for certain which one started first, but she also reports that she does get back pain from time to time.  Given that flu is nearly epidemic right now it is possible she could have influenza although she did receive vaccination; since she will require admission I will check influenza PCR.  She received Solu-Medrol and a DuoNeb prior to arrival.  I will give her two additional DuoNeb treatments as well as 2 g of magnesium IV.  Lab work and chest x-ray are pending.  Given the amount of respiratory distress the patient is experiencing at this time and how tired she looks, I am starting her on BiPAP as well and have called the respiratory therapist to request that they come begin treatment.  Clinical Course as of Dec 10 299  Thu Dec 10, 2018    0043 Patient's PCO2 is significantly elevated at 76 but she is alert, awake, and speaking clearly with me.  If she were to be intubated I think it would be very difficult to extubate her.  I  will continue on BiPAP and discussed the case with the hospitalist.  She has a nonspecific leukocytosis of 14.5 and a reassuring comprehensive metabolic panel.  I am giving her 500 mL normal saline bolus for her insensible losses.  Blood gases otherwise consistent with her COPD exacerbation.  I have paged the hospitalist service.  pCO2, Ven(!!): 76 [CF]  0116 negative influenza panel.  Discussed case by phone with Dr. Jannifer Franklin at about [phone] and he will admit.  Influenza panel by PCR (type A & B) [CF]    Clinical Course User Index [CF] Hinda Kehr, MD    ____________________________________________  FINAL CLINICAL IMPRESSION(S) / ED DIAGNOSES  Final diagnoses:  Acute on chronic respiratory failure with hypoxia and hypercapnia (HCC)  COPD exacerbation (HCC)     MEDICATIONS GIVEN DURING THIS VISIT:  Medications  sodium chloride 0.9 % bolus 500 mL (500 mLs Intravenous New Bag/Given 12/10/18 0021)  magnesium sulfate IVPB 2 g 50 mL (2 g Intravenous New Bag/Given 12/10/18 0020)  ipratropium-albuterol (DUONEB) 0.5-2.5 (3) MG/3ML nebulizer solution 3 mL (3 mLs Nebulization Given 12/10/18 0016)  ipratropium-albuterol (DUONEB) 0.5-2.5 (3) MG/3ML nebulizer solution 3 mL (3 mLs Nebulization Given 12/10/18 0017)     ED Discharge Orders    None       Note:  This document was prepared using Dragon voice recognition software and may include unintentional dictation errors.    Hinda Kehr, MD 12/10/18 9532    Hinda Kehr, MD 12/10/18 0233

## 2018-12-10 ENCOUNTER — Other Ambulatory Visit: Payer: Self-pay

## 2018-12-10 ENCOUNTER — Emergency Department: Payer: PPO

## 2018-12-10 DIAGNOSIS — L8990 Pressure ulcer of unspecified site, unspecified stage: Secondary | ICD-10-CM

## 2018-12-10 DIAGNOSIS — J449 Chronic obstructive pulmonary disease, unspecified: Secondary | ICD-10-CM | POA: Diagnosis not present

## 2018-12-10 DIAGNOSIS — I5022 Chronic systolic (congestive) heart failure: Secondary | ICD-10-CM | POA: Diagnosis not present

## 2018-12-10 DIAGNOSIS — Z716 Tobacco abuse counseling: Secondary | ICD-10-CM | POA: Diagnosis not present

## 2018-12-10 DIAGNOSIS — I10 Essential (primary) hypertension: Secondary | ICD-10-CM | POA: Diagnosis not present

## 2018-12-10 DIAGNOSIS — J9621 Acute and chronic respiratory failure with hypoxia: Secondary | ICD-10-CM | POA: Diagnosis present

## 2018-12-10 DIAGNOSIS — J9622 Acute and chronic respiratory failure with hypercapnia: Secondary | ICD-10-CM | POA: Diagnosis present

## 2018-12-10 DIAGNOSIS — R0602 Shortness of breath: Secondary | ICD-10-CM | POA: Diagnosis not present

## 2018-12-10 LAB — COMPREHENSIVE METABOLIC PANEL
ALBUMIN: 3.6 g/dL (ref 3.5–5.0)
ALT: 16 U/L (ref 0–44)
ANION GAP: 4 — AB (ref 5–15)
AST: 26 U/L (ref 15–41)
Alkaline Phosphatase: 55 U/L (ref 38–126)
BILIRUBIN TOTAL: 0.5 mg/dL (ref 0.3–1.2)
BUN: 18 mg/dL (ref 8–23)
CO2: 36 mmol/L — ABNORMAL HIGH (ref 22–32)
Calcium: 8.7 mg/dL — ABNORMAL LOW (ref 8.9–10.3)
Chloride: 97 mmol/L — ABNORMAL LOW (ref 98–111)
Creatinine, Ser: 0.71 mg/dL (ref 0.44–1.00)
GFR calc Af Amer: >60 mL/min (ref >60)
GFR calc non Af Amer: >60 mL/min (ref >60)
Glucose, Bld: 119 mg/dL — ABNORMAL HIGH (ref 70–99)
POTASSIUM: 4.3 mmol/L (ref 3.5–5.1)
Sodium: 137 mmol/L (ref 135–145)
Total Protein: 6.6 g/dL (ref 6.5–8.1)

## 2018-12-10 LAB — LIPASE, BLOOD: Lipase: 32 U/L (ref 11–51)

## 2018-12-10 LAB — BLOOD GAS, VENOUS
Acid-Base Excess: 10.8 mmol/L — ABNORMAL HIGH (ref 0.0–2.0)
Bicarbonate: 40.1 mmol/L — ABNORMAL HIGH (ref 20.0–28.0)
O2 Saturation: 23 %
PATIENT TEMPERATURE: 37
pCO2, Ven: 76 mmHg (ref 44.0–60.0)
pH, Ven: 7.33 (ref 7.250–7.430)
pO2, Ven: <31 mmHg — CL (ref 32.0–45.0)

## 2018-12-10 LAB — CBC WITH DIFFERENTIAL/PLATELET
Abs Immature Granulocytes: 0.08 10*3/uL — ABNORMAL HIGH (ref 0.00–0.07)
Basophils Absolute: 0.1 10*3/uL (ref 0.0–0.1)
Basophils Relative: 1 %
Eosinophils Absolute: 0.1 10*3/uL (ref 0.0–0.5)
Eosinophils Relative: 1 %
HCT: 37.4 % (ref 36.0–46.0)
Hemoglobin: 11.5 g/dL — ABNORMAL LOW (ref 12.0–15.0)
Immature Granulocytes: 1 %
Lymphocytes Relative: 14 %
Lymphs Abs: 2.1 10*3/uL (ref 0.7–4.0)
MCH: 28.8 pg (ref 26.0–34.0)
MCHC: 30.7 g/dL (ref 30.0–36.0)
MCV: 93.7 fL (ref 80.0–100.0)
MONO ABS: 1.2 10*3/uL — AB (ref 0.1–1.0)
Monocytes Relative: 8 %
Neutro Abs: 11 10*3/uL — ABNORMAL HIGH (ref 1.7–7.7)
Neutrophils Relative %: 75 %
Platelets: 289 10*3/uL (ref 150–400)
RBC: 3.99 MIL/uL (ref 3.87–5.11)
RDW: 14 % (ref 11.5–15.5)
WBC: 14.5 10*3/uL — ABNORMAL HIGH (ref 4.0–10.5)
nRBC: 0 % (ref 0.0–0.2)

## 2018-12-10 LAB — GLUCOSE, CAPILLARY: Glucose-Capillary: 78 mg/dL (ref 70–99)

## 2018-12-10 LAB — INFLUENZA PANEL BY PCR (TYPE A & B)
Influenza A By PCR: NEGATIVE
Influenza B By PCR: NEGATIVE

## 2018-12-10 LAB — BRAIN NATRIURETIC PEPTIDE: B Natriuretic Peptide: 429 pg/mL — ABNORMAL HIGH (ref 0.0–100.0)

## 2018-12-10 LAB — TROPONIN I: Troponin I: <0.03 ng/mL (ref <0.03)

## 2018-12-10 LAB — TSH: TSH: 3.543 u[IU]/mL (ref 0.350–4.500)

## 2018-12-10 LAB — MAGNESIUM: Magnesium: 1.8 mg/dL (ref 1.7–2.4)

## 2018-12-10 MED ORDER — ENSURE ENLIVE PO LIQD
237.0000 mL | Freq: Three times a day (TID) | ORAL | Status: DC
  Administered 2018-12-10 – 2018-12-11 (×3): 237 mL via ORAL

## 2018-12-10 MED ORDER — ACETAMINOPHEN 325 MG PO TABS: 650.0000 mg | ORAL_TABLET | Freq: Four times a day (QID) | ORAL | Status: DC | PRN

## 2018-12-10 MED ORDER — ALPRAZOLAM 0.5 MG PO TABS
0.5000 mg | ORAL_TABLET | Freq: Two times a day (BID) | ORAL | Status: DC | PRN
  Administered 2018-12-10 – 2018-12-11 (×3): 0.5 mg via ORAL
  Filled 2018-12-10 (×3): qty 1

## 2018-12-10 MED ORDER — ONDANSETRON HCL 4 MG/2ML IJ SOLN: 4.0000 mg | Freq: Four times a day (QID) | INTRAMUSCULAR | Status: DC | PRN

## 2018-12-10 MED ORDER — VITAMIN C 500 MG PO TABS
250.0000 mg | ORAL_TABLET | Freq: Two times a day (BID) | ORAL | Status: DC
  Administered 2018-12-10 – 2018-12-11 (×2): 250 mg via ORAL
  Filled 2018-12-10 (×2): qty 1

## 2018-12-10 MED ORDER — ADULT MULTIVITAMIN W/MINERALS CH
1.0000 | ORAL_TABLET | Freq: Every day | ORAL | Status: DC
  Administered 2018-12-10: 1 via ORAL
  Filled 2018-12-10: qty 1

## 2018-12-10 MED ORDER — ACETAMINOPHEN 650 MG RE SUPP: 650.0000 mg | Freq: Four times a day (QID) | RECTAL | Status: DC | PRN

## 2018-12-10 MED ORDER — HYDROCODONE-ACETAMINOPHEN 5-325 MG PO TABS
1.0000 | ORAL_TABLET | ORAL | Status: DC | PRN
  Administered 2018-12-10 – 2018-12-11 (×7): 1 via ORAL
  Filled 2018-12-10 (×7): qty 1

## 2018-12-10 MED ORDER — DOCUSATE SODIUM 100 MG PO CAPS
100.0000 mg | ORAL_CAPSULE | Freq: Two times a day (BID) | ORAL | Status: DC
  Administered 2018-12-10 – 2018-12-11 (×3): 100 mg via ORAL
  Filled 2018-12-10 (×3): qty 1

## 2018-12-10 MED ORDER — ONDANSETRON HCL 4 MG PO TABS: 4.0000 mg | ORAL_TABLET | Freq: Four times a day (QID) | ORAL | Status: DC | PRN

## 2018-12-10 MED ORDER — ALBUTEROL SULFATE (2.5 MG/3ML) 0.083% IN NEBU
2.5000 mg | INHALATION_SOLUTION | RESPIRATORY_TRACT | Status: DC | PRN
  Administered 2018-12-11: 2.5 mg via RESPIRATORY_TRACT
  Filled 2018-12-10: qty 3

## 2018-12-10 MED ORDER — IPRATROPIUM-ALBUTEROL 0.5-2.5 (3) MG/3ML IN SOLN
3.0000 mL | Freq: Once | RESPIRATORY_TRACT | Status: AC
  Administered 2018-12-10: 3 mL via RESPIRATORY_TRACT
  Filled 2018-12-10: qty 3

## 2018-12-10 MED ORDER — SODIUM CHLORIDE 0.9 % IV BOLUS
500.0000 mL | Freq: Once | INTRAVENOUS | Status: AC
  Administered 2018-12-10: 500 mL via INTRAVENOUS

## 2018-12-10 MED ORDER — ENOXAPARIN SODIUM 40 MG/0.4ML ~~LOC~~ SOLN
40.0000 mg | SUBCUTANEOUS | Status: DC
  Administered 2018-12-10: 40 mg via SUBCUTANEOUS
  Filled 2018-12-10: qty 0.4

## 2018-12-10 MED ORDER — MAGNESIUM SULFATE 2 GM/50ML IV SOLN
2.0000 g | Freq: Once | INTRAVENOUS | Status: AC
  Administered 2018-12-10: 2 g via INTRAVENOUS
  Filled 2018-12-10: qty 50

## 2018-12-10 MED ORDER — ALBUTEROL SULFATE (2.5 MG/3ML) 0.083% IN NEBU: 2.5000 mg | INHALATION_SOLUTION | Freq: Four times a day (QID) | RESPIRATORY_TRACT | Status: DC | PRN

## 2018-12-10 MED ORDER — FLUTICASONE FUROATE-VILANTEROL 200-25 MCG/INH IN AEPB
1.0000 | INHALATION_SPRAY | Freq: Every day | RESPIRATORY_TRACT | Status: DC
  Administered 2018-12-10 – 2018-12-11 (×2): 1 via RESPIRATORY_TRACT
  Filled 2018-12-10: qty 28

## 2018-12-10 NOTE — ED Notes (Signed)
Rt paged by MD to place pt on Bipap

## 2018-12-10 NOTE — Progress Notes (Signed)
  Patient briefly seen and examined. Agree with admitting MD plan  Tobacco dependence: Patient is encouraged to quit smoking. Counseling was provided for 4 minutes.

## 2018-12-10 NOTE — Progress Notes (Signed)
Ch visited w/ pt regarding ACP. Pt was upset that she was presented information related to ACP again. Pt denied wanting ACP. Pt shared that she lost a son to cancer recently and was still traumatized by how he passed related to ACP. Pt was tearful and somber but thankful of having a visit w/ ch. Pt appreciated visit. No further needed at this time.     12/10/18 1500  Clinical Encounter Type  Visited With Patient and family together  Visit Type Initial;Psychological support;Spiritual support;Social support  Referral From Physician  Consult/Referral To Chaplain  Recommendations Pt does NOT want ACP   Spiritual Encounters  Spiritual Needs Emotional;Grief support  Stress Factors  Patient Stress Factors Exhausted;Family relationships;Loss;Major life changes;Health changes  Family Stress Factors Loss;Loss of control  Advance Directives (For Healthcare)  Does Patient Have a Medical Advance Directive? No  Would patient like information on creating a medical advance directive? No - Patient declined

## 2018-12-10 NOTE — Care Management (Signed)
Provided the patient with Meals on wheel for [employer] information, I explained the rules were to be over 60, live alone or with spouse and that she is not able to drive, also that she has less than 20 hours a week in home help and that she can't fix meals, She stated that she does drive but doesn't if she takes pain meds.  I encouraged her to talk with them to see if there was an exception

## 2018-12-10 NOTE — ED Notes (Signed)
Dr. Marcille Blanco with admitting team at bedside.

## 2018-12-10 NOTE — ED Notes (Signed)
Provider at bedside

## 2018-12-10 NOTE — Progress Notes (Signed)
   12/10/18 1000  Clinical Encounter Type  Visited With Patient not available  Visit Type Initial  Referral From Physician   Chaplain received an OR to complete or update an AD. Unable to visit with the patient because she was asleep. Will attempt to visit at a later time.

## 2018-12-10 NOTE — Progress Notes (Signed)
Patient requesting xanax paged mody.

## 2018-12-10 NOTE — Care Management Note (Signed)
Case Management Note  Patient Details  Name: Robin Hudson MRN: [id number] Date of Birth: May 20, 1941  Subjective/Objective:                   Met with patient to disshe iscuss DC plan Patient lives with husband but he is unable to help her due to health issues Patient has walker, cane and wheelchair has no DME needs Husband drives her to the Dr. When needed Is on Chronic O2 at 2 liters Uses [employer] for Oxygen needs She doesn't know that any HH PT would help her, she said she walks fine She is able to bathe herself but states she is unable to really do the housework or cook meals, will check on meals on wheels for Patient.   Action/Plan: Checking on meals on wheels for Patient, will provide information  Expected Discharge Date:                  Expected Discharge Plan:     In-House Referral:     Discharge planning Services  CM Consult  Post Acute Care Choice:    Choice offered to:     DME Arranged:    DME Agency:     HH Arranged:    [place] Agency:     Status of Service:  In process, will continue to follow  If discussed at Long Length of Stay Meetings, dates discussed:    Additional Comments:  Su Hilt, RN 12/10/2018, 1:23 PM

## 2018-12-10 NOTE — Care Management Obs Status (Signed)
[place] NOTIFICATION   Patient Details  Name: Robin Hudson MRN: [id number] Date of Birth: 12/19/1940   Medicare Observation Status Notification Given:  Yes    Jaxie Racanelli Jen Mow, RN 12/10/2018, 1:22 PM

## 2018-12-10 NOTE — Progress Notes (Signed)
Family Meeting Note  Advance Directive:no  Today a meeting took place with the Patient.  The following clinical team members were present during this meeting:MD  The following were discussed:Patient's diagnosis copd exacerbation: , Patient's progosis: Unable to determine and Goals for treatment: Full Code  Additional follow-up to be provided: Chaplain consultation to start advanced directives.  Time spent during discussion:16 minutes  Robin Lampson, MD

## 2018-12-10 NOTE — Progress Notes (Signed)
Initial Nutrition Assessment  DOCUMENTATION CODES:   Severe malnutrition in context of chronic illness  INTERVENTION:   Ensure Enlive po TID, each supplement provides 350 kcal and 20 grams of protein  MVI daily   Magic cup TID with meals, each supplement provides 290 kcal and 9 grams of protein  Vitamin C 250mg  po BID   Liberalize diet  NUTRITION DIAGNOSIS:   Severe Malnutrition related to cancer and cancer related treatments, other (see comment)(COPD) as evidenced by moderate to severe fat depletions, moderate to severe muscle depletions.  GOAL:   Patient will meet greater than or equal to 90% of their needs  MONITOR:   PO intake, Supplement acceptance, Weight trends, Labs, Skin, I & O's  REASON FOR ASSESSMENT:   Other (Comment)(low BMI)    ASSESSMENT:   with past medical history of COPD, CHF, hypertension and history of colon and breast cancer status post chemo and radiation presents to the emergency department with shortness of breath.    Pt familiar to nutrition department from multiple previous admits. Pt with fair appetite and oral intake; pt generally eats fairly well in the hospital. Pt enjoys drinking strawberry Ensure at home. Pt currently eating 100% of meals in hospital. Per chart, pt with 11lb(11%) wt loss over the past year. RD will order supplements and MVI to help pt meet her estimated needs. Will add vitamin C to support wound healing. Liberal diet.   Medications reviewed and include: colace  Labs reviewed: K 4.3 wnl, Mg 1.8 wnl BNP 429(H) Wbc- 14.5(H)  NUTRITION - FOCUSED PHYSICAL EXAM:    Most Recent Value  Orbital Region  Mild depletion  Upper Arm Region  Severe depletion  Thoracic and Lumbar Region  Moderate depletion  Buccal Region  Mild depletion  Temple Region  Moderate depletion  Clavicle Bone Region  Severe depletion  Clavicle and Acromion Bone Region  Severe depletion  Scapular Bone Region  Moderate depletion  Dorsal Hand  Severe  depletion  Patellar Region  Severe depletion  Anterior Thigh Region  Severe depletion  Posterior Calf Region  Severe depletion  Edema (RD Assessment)  None  Hair  Reviewed  Eyes  Reviewed  Mouth  Reviewed  Skin  Reviewed  Nails  Reviewed     Diet Order:   Diet Order            Diet regular Room service appropriate? Yes; Fluid consistency: Thin  Diet effective now             EDUCATION NEEDS:   No education needs have been identified at this time  Skin:  Skin Assessment: Reviewed RN Assessment(Stage I sacrum )  Last BM:  1/22  Height:   Ht Readings from Last 1 Encounters:  12/09/18 5\' 5"  (1.651 m)    Weight:   Wt Readings from Last 1 Encounters:  12/10/18 42.5 kg    Ideal Body Weight:  56.8 kg  BMI:  Body mass index is 15.58 kg/m.  Estimated Nutritional Needs:   Kcal:  1200-1400kcal/day   Protein:  64-73g/day   Fluid:  1.1L/day   Koleen Distance MS, RD, LDN Pager #- [phone] Office#- [phone] After Hours Pager: [phone]

## 2018-12-10 NOTE — ED Notes (Signed)
Pt trialed off of bipap. Tolerating well.

## 2018-12-10 NOTE — ACP (Advance Care Planning) (Signed)
Pt was very clear that she did NOT want ACP after meeting w/ the ch on 12/10/18.

## 2018-12-10 NOTE — H&P (Signed)
Robin Hudson is an 78 y.o. female.   Chief Complaint: Shortness of breath HPI: The patient with past medical history of COPD, CHF, hypertension and history of breast cancer status post chemo and radiation presents to the emergency department with shortness of breath.  The patient required 2 breathing treatments today that did not help.  In route to the emergency department the patient received Solu-Medrol as well as breathing treatment.  Her baseline oxygen of 2 L per nasal cannula was increased to 4 L/min but the patient continued to have increased work of breathing which prompted initiation of BiPAP.  ABG showed PCO2 76.  After more than an hour on BiPAP the patient's respiratory effort dramatically improved and she was more alert and comfortable.  BiPAP was discontinued and the hospitalist service was called for admission.  Past Medical History:  Diagnosis Date  . Allergic rhinitis   . Anxiety   . Asthma   . B12 deficiency   . Breast cancer ([place]) 1999   RT LUMPECTOMY  . Carcinoma of right breast treated with adjuvant chemotherapy ([place]) 1999   RT LUMPECTOMY  . CHF (congestive heart failure) ([place])   . Colon cancer ([place]) 2010  . Colon cancer ([place])   . COPD (chronic obstructive pulmonary disease) ([place])   . Hyperlipemia   . Hypertension   . MRSA pneumonia ([place])   . Osteoporosis   . Radiation 1999   BREAST CA  . Right adrenal mass [facility])     Past Surgical History:  Procedure Laterality Date  . ABDOMINAL HYSTERECTOMY    . APPENDECTOMY    . BREAST SURGERY Right    lumpectomy x2 with lymph nodes  . CARPAL TUNNEL RELEASE Left 06/04/2016   Procedure: CARPAL TUNNEL RELEASE;  Surgeon: Hessie Knows, MD;  Location: [facility] ORS;  Service: Orthopedics;  Laterality: Left;  . colectomy     partial  . IMAGE GUIDED SINUS SURGERY    . LEFT HEART CATH AND CORONARY ANGIOGRAPHY N/A 10/08/2018   Procedure: LEFT HEART CATH AND CORONARY ANGIOGRAPHY;  Surgeon: Corey Skains, MD;  Location: [facility];  Service: Cardiovascular;  Laterality: N/A;  . OPEN REDUCTION INTERNAL FIXATION (ORIF) DISTAL RADIAL FRACTURE Left 06/04/2016   Procedure: OPEN REDUCTION INTERNAL FIXATION (ORIF) DISTAL RADIAL FRACTURE;  Surgeon: Hessie Knows, MD;  Location: [facility] ORS;  Service: Orthopedics;  Laterality: Left;    Family History  Problem Relation Age of Onset  . Heart attack Mother   . Breast cancer Other   . Breast cancer Sister 32  . Breast cancer Sister 13   Social History:  reports that she has been smoking cigarettes. She has been smoking about 0.25 packs per day. She has never used smokeless tobacco. She reports that she does not drink alcohol or use drugs.  Allergies:  Allergies  Allergen Reactions  . Ace Inhibitors     Other reaction(s): Cough  . Alendronate     Other reaction(s): Other (See Comments) GI UPSET  . Coreg [Carvedilol] Other (See Comments)    syncope  . Duloxetine Other (See Comments)  . Morphine Other (See Comments)    confusion  . Pregabalin     Other reaction(s): Other (See Comments) intolerant  . Statins Other (See Comments)  . Verapamil     Other reaction(s): Other (See Comments) CONSTIPATION    Medications Prior to Admission  Medication Sig Dispense Refill  . albuterol (PROAIR HFA) 108 (90 Base) MCG/ACT inhaler Inhale 2 puffs into the lungs  every 6 (six) hours as needed for wheezing or shortness of breath.     Marland Kitchen albuterol (PROVENTIL) (2.5 MG/3ML) 0.083% nebulizer solution Take 2.5 mg by nebulization every 6 (six) hours as needed for wheezing or shortness of breath.    . ALPRAZolam (XANAX) 0.5 MG tablet Take 1 tablet by mouth 2 (two) times daily as needed for anxiety.   3  . aspirin EC 81 MG tablet Take 1 tablet (81 mg total) by mouth daily. 30 tablet 2  . atenolol (TENORMIN) 50 MG tablet Take 0.5 tablets (25 mg total) by mouth daily. 30 tablet 0  . azelastine (ASTELIN) 0.1 % nasal spray Place 1 spray into both nostrils 2 (two) times daily. Use in  each nostril as directed    . butalbital-acetaminophen-caffeine (FIORICET, ESGIC) 50-325-40 MG tablet Take 1 tablet by mouth every 6 (six) hours as needed for migraine.   0  . cholecalciferol (VITAMIN D) 1000 units tablet Take 1,000 Units by mouth daily.    . clopidogrel (PLAVIX) 75 MG tablet Take 1 tablet (75 mg total) by mouth daily. 30 tablet 1  . escitalopram (LEXAPRO) 5 MG tablet Take 5 mg by mouth daily.    . Fluticasone-Salmeterol (ADVAIR DISKUS) 250-50 MCG/DOSE AEPB Inhale 1 puff into the lungs 2 (two) times daily.     Marland Kitchen HYDROcodone-acetaminophen (NORCO) 7.5-325 MG tablet Take 1 tablet by mouth 3 (three) times daily as needed for moderate pain.    . hydroxychloroquine (PLAQUENIL) 200 MG tablet Take 400 mg by mouth daily.    Marland Kitchen losartan (COZAAR) 50 MG tablet Take 1 tablet (50 mg total) by mouth daily. 30 tablet 11  . nitroGLYCERIN (NITROSTAT) 0.4 MG SL tablet Place 1 tablet (0.4 mg total) under the tongue every 5 (five) minutes as needed for chest pain. 15 tablet 12    Results for orders placed or performed during the hospital encounter of 12/09/18 (from the past 48 hour(s))  Comprehensive metabolic panel     Status: Abnormal   Collection Time: 12/09/18 11:40 PM  Result Value Ref Range   Sodium 137 135 - 145 mmol/L   Potassium 4.3 3.5 - 5.1 mmol/L    Comment: HEMOLYSIS AT THIS LEVEL MAY AFFECT RESULT   Chloride 97 (L) 98 - 111 mmol/L   CO2 36 (H) 22 - 32 mmol/L   Glucose, Bld 119 (H) 70 - 99 mg/dL   BUN 18 8 - 23 mg/dL   Creatinine, Ser 0.71 0.44 - 1.00 mg/dL   Calcium 8.7 (L) 8.9 - 10.3 mg/dL   Total Protein 6.6 6.5 - 8.1 g/dL   Albumin 3.6 3.5 - 5.0 g/dL   AST 26 15 - 41 U/L   ALT 16 0 - 44 U/L   Alkaline Phosphatase 55 38 - 126 U/L   Total Bilirubin 0.5 0.3 - 1.2 mg/dL   GFR calc non Af Amer >60 >60 mL/min   GFR calc Af Amer >60 >60 mL/min   Anion gap 4 (L) 5 - 15    Comment: Performed at [facility], [address] [place], [place] [address]  Troponin I - Once      Status: None   Collection Time: 12/09/18 11:40 PM  Result Value Ref Range   Troponin I <0.03 <0.03 ng/mL    Comment: Performed at [facility], [address] [place], [place] [address]  Lipase, blood     Status: None   Collection Time: 12/09/18 11:40 PM  Result Value Ref Range  Lipase 32 11 - 51 U/L    Comment: Performed at [facility], [address] [place], [place] [address]  Brain natriuretic peptide     Status: Abnormal   Collection Time: 12/09/18 11:40 PM  Result Value Ref Range   B Natriuretic Peptide 429.0 (H) 0.0 - 100.0 pg/mL    Comment: Performed at [facility], [address] [place], [place] [address]  CBC with Differential     Status: Abnormal   Collection Time: 12/09/18 11:40 PM  Result Value Ref Range   WBC 14.5 (H) 4.0 - 10.5 K/uL   RBC 3.99 3.87 - 5.11 MIL/uL   Hemoglobin 11.5 (L) 12.0 - 15.0 g/dL   HCT 37.4 36.0 - 46.0 %   MCV 93.7 80.0 - 100.0 fL   MCH 28.8 26.0 - 34.0 pg   MCHC 30.7 30.0 - 36.0 g/dL   RDW 14.0 11.5 - 15.5 %   Platelets 289 150 - 400 K/uL   nRBC 0.0 0.0 - 0.2 %   Neutrophils Relative % 75 %   Neutro Abs 11.0 (H) 1.7 - 7.7 K/uL   Lymphocytes Relative 14 %   Lymphs Abs 2.1 0.7 - 4.0 K/uL   Monocytes Relative 8 %   Monocytes Absolute 1.2 (H) 0.1 - 1.0 K/uL   Eosinophils Relative 1 %   Eosinophils Absolute 0.1 0.0 - 0.5 K/uL   Basophils Relative 1 %   Basophils Absolute 0.1 0.0 - 0.1 K/uL   Immature Granulocytes 1 %   Abs Immature Granulocytes 0.08 (H) 0.00 - 0.07 K/uL    Comment: Performed at [facility], [address], [place], [place] [address]  Magnesium     Status: None   Collection Time: 12/09/18 11:40 PM  Result Value Ref Range   Magnesium 1.8 1.7 - 2.4 mg/dL    Comment: Performed at [facility], [address] [place], [place] [address]  Blood gas, venous     Status: Abnormal   Collection Time: 12/09/18 11:49 PM  Result Value Ref Range   pH, Ven 7.33 7.250 - 7.430    pCO2, Ven 76 (HH) 44.0 - 60.0 mmHg    Comment: CRITICAL RESULT CALLED TO, READ BACK BY AND VERIFIED WITH: DR.FORBACH AT 0041 ON 12/10/2018 K[facility]    pO2, Ven <31.0 (LL) 32.0 - 45.0 mmHg   Bicarbonate 40.1 (H) 20.0 - 28.0 mmol/L   Acid-Base Excess 10.8 (H) 0.0 - 2.0 mmol/L   O2 Saturation 23.0 %   Patient temperature 37.0    Collection site VENOUS    Sample type VENOUS     Comment: Performed at [facility], [address] [place], [place] [address]  Influenza panel by PCR (type A & B)     Status: None   Collection Time: 12/10/18 12:27 AM  Result Value Ref Range   Influenza A By PCR NEGATIVE NEGATIVE   Influenza B By PCR NEGATIVE NEGATIVE    Comment: (NOTE) The Xpert Xpress Flu assay is intended as an aid in the diagnosis of  influenza and should not be used as a sole basis for treatment.  This  assay is FDA approved for nasopharyngeal swab specimens only. Nasal  washings and aspirates are unacceptable for Xpert Xpress Flu testing. Performed at [facility], [address], [place], [place] [address]    Dg Chest Port 1 View  Result Date: 12/10/2018 CLINICAL DATA:  Severe shortness of breath EXAM: PORTABLE CHEST 1 VIEW COMPARISON:  11/07/2018, CT  10/30/2017, 08/08/2018 FINDINGS: Hyperinflation with emphysematous disease. Right greater than left apical pleural and parenchymal scarring with hilar retraction as before. No acute airspace disease. Stable cardiomediastinal silhouette with aortic atherosclerosis. No pneumothorax. Bronchiectasis in the upper lobes. IMPRESSION: Similar appearance of right greater than left biapical pleural and parenchymal scarring with retraction of the pulmonary hila. No acute airspace disease. Electronically Signed   By: Donavan Foil M.D.   On: 12/10/2018 00:26    Review of Systems  Constitutional: Negative for chills and fever.  HENT: Negative for sore throat and tinnitus.   Eyes: Negative for blurred vision and redness.  Respiratory:  Positive for cough, shortness of breath and wheezing.   Cardiovascular: Negative for chest pain, palpitations, orthopnea and PND.  Gastrointestinal: Negative for abdominal pain, diarrhea, nausea and vomiting.  Genitourinary: Negative for dysuria, frequency and urgency.  Musculoskeletal: Negative for joint pain and myalgias.  Skin: Negative for rash.       No lesions  Neurological: Negative for speech change, focal weakness and weakness.  Endo/Heme/Allergies: Does not bruise/bleed easily.       No temperature intolerance  Psychiatric/Behavioral: Negative for depression and suicidal ideas.    Blood pressure 127/76, pulse 97, temperature 98.2 F (36.8 C), temperature source Oral, resp. rate 20, height 5\' 5"  (1.651 m), weight 42.5 kg, SpO2 96 %. Physical Exam  Vitals reviewed. Constitutional: She is oriented to person, place, and time. She appears well-developed and well-nourished. No distress.  HENT:  Head: Normocephalic and atraumatic.  Mouth/Throat: Oropharynx is clear and moist.  Eyes: Pupils are equal, round, and reactive to light. Conjunctivae and EOM are normal. No scleral icterus.  Neck: Normal range of motion. Neck supple. No JVD present. No tracheal deviation present. No thyromegaly present.  Cardiovascular: Normal rate, regular rhythm and normal heart sounds. Exam reveals no gallop and no friction rub.  No murmur heard. Respiratory: Effort normal and breath sounds normal.  GI: Soft. Bowel sounds are normal. She exhibits no distension. There is no abdominal tenderness.  Genitourinary:    Genitourinary Comments: Deferred   Musculoskeletal: Normal range of motion.        General: No edema.  Lymphadenopathy:    She has no cervical adenopathy.  Neurological: She is alert and oriented to person, place, and time. No cranial nerve deficit. She exhibits normal muscle tone.  Skin: Skin is warm and dry. No rash noted. No erythema.  Psychiatric: She has a normal mood and affect. Her  behavior is normal. Judgment and thought content normal.     Assessment/Plan This is a 78 year old female admitted for respiratory failure. 1.  Respiratory failure: Acute on chronic; with hypercapnia and hypoxia.  The patient is comfortable now and on her baseline oxygen requirement.  Maintain oxygen saturations 88 to 92%.  The patient is frequent COPD exacerbations.  Recommend long steroid taper. 2.  COPD: Continue inhaled corticosteroid as well as anticholinergic agent and beta agonist.  Plan as above 3.  Hypertension: Controlled; resume antihypertensive medication when home med list verified 4.  CHF: Chronic; systolic.  LVEF 45% in November 2019. 5.  CAD: Stable; continue aspirin and Plavix 6.  DVT prophylaxis: [place] 7.  GI prophylaxis: None The patient is a full code.  Time spent on admission orders and patient care approximately 45 minutes  Harrie Foreman, MD 12/10/2018, 5:50 AM

## 2018-12-10 NOTE — ED Notes (Signed)
RT at bedside, pt tolerating bipap well at this time.

## 2018-12-11 ENCOUNTER — Encounter: Payer: Self-pay | Admitting: *Deleted

## 2018-12-11 ENCOUNTER — Emergency Department
Admission: EM | Admit: 2018-12-11 | Discharge: 2018-12-12 | Disposition: A | Discharge status: Home / Self Care | Payer: PPO | Source: Home / Self Care | Attending: Emergency Medicine | Admitting: Emergency Medicine

## 2018-12-11 ENCOUNTER — Emergency Department: Payer: PPO

## 2018-12-11 DIAGNOSIS — Z7902 Long term (current) use of antithrombotics/antiplatelets: Secondary | ICD-10-CM

## 2018-12-11 DIAGNOSIS — Z7982 Long term (current) use of aspirin: Secondary | ICD-10-CM

## 2018-12-11 DIAGNOSIS — J441 Chronic obstructive pulmonary disease with (acute) exacerbation: Secondary | ICD-10-CM

## 2018-12-11 DIAGNOSIS — J9621 Acute and chronic respiratory failure with hypoxia: Secondary | ICD-10-CM | POA: Diagnosis not present

## 2018-12-11 DIAGNOSIS — I5022 Chronic systolic (congestive) heart failure: Secondary | ICD-10-CM | POA: Diagnosis not present

## 2018-12-11 DIAGNOSIS — F17210 Nicotine dependence, cigarettes, uncomplicated: Secondary | ICD-10-CM | POA: Insufficient documentation

## 2018-12-11 DIAGNOSIS — Z79899 Other long term (current) drug therapy: Secondary | ICD-10-CM

## 2018-12-11 DIAGNOSIS — J984 Other disorders of lung: Secondary | ICD-10-CM | POA: Diagnosis not present

## 2018-12-11 DIAGNOSIS — R0602 Shortness of breath: Secondary | ICD-10-CM | POA: Diagnosis not present

## 2018-12-11 DIAGNOSIS — Z716 Tobacco abuse counseling: Secondary | ICD-10-CM | POA: Diagnosis not present

## 2018-12-11 DIAGNOSIS — I10 Essential (primary) hypertension: Secondary | ICD-10-CM

## 2018-12-11 DIAGNOSIS — R0781 Pleurodynia: Secondary | ICD-10-CM | POA: Diagnosis not present

## 2018-12-11 DIAGNOSIS — J449 Chronic obstructive pulmonary disease, unspecified: Secondary | ICD-10-CM | POA: Diagnosis not present

## 2018-12-11 DIAGNOSIS — R079 Chest pain, unspecified: Secondary | ICD-10-CM | POA: Diagnosis not present

## 2018-12-11 DIAGNOSIS — E43 Unspecified severe protein-calorie malnutrition: Secondary | ICD-10-CM

## 2018-12-11 LAB — CBC WITH DIFFERENTIAL/PLATELET
ABS IMMATURE GRANULOCYTES: 0.06 10*3/uL (ref 0.00–0.07)
Basophils Absolute: 0 10*3/uL (ref 0.0–0.1)
Basophils Relative: 0 %
Eosinophils Absolute: 0 10*3/uL (ref 0.0–0.5)
Eosinophils Relative: 0 %
HCT: 37.8 % (ref 36.0–46.0)
HEMOGLOBIN: 11.9 g/dL — AB (ref 12.0–15.0)
Immature Granulocytes: 1 %
Lymphocytes Relative: 5 %
Lymphs Abs: 0.7 10*3/uL (ref 0.7–4.0)
MCH: 28.5 pg (ref 26.0–34.0)
MCHC: 31.5 g/dL (ref 30.0–36.0)
MCV: 90.4 fL (ref 80.0–100.0)
Monocytes Absolute: 0.6 10*3/uL (ref 0.1–1.0)
Monocytes Relative: 4 %
Neutro Abs: 11.8 10*3/uL — ABNORMAL HIGH (ref 1.7–7.7)
Neutrophils Relative %: 90 %
Platelets: 312 10*3/uL (ref 150–400)
RBC: 4.18 MIL/uL (ref 3.87–5.11)
RDW: 13.9 % (ref 11.5–15.5)
WBC: 13.2 10*3/uL — ABNORMAL HIGH (ref 4.0–10.5)
nRBC: 0 % (ref 0.0–0.2)

## 2018-12-11 LAB — MAGNESIUM: Magnesium: 1.8 mg/dL (ref 1.7–2.4)

## 2018-12-11 LAB — POTASSIUM: Potassium: 4.3 mmol/L (ref 3.5–5.1)

## 2018-12-11 LAB — BASIC METABOLIC PANEL
Anion gap: 5 (ref 5–15)
BUN: 22 mg/dL (ref 8–23)
CALCIUM: 9.1 mg/dL (ref 8.9–10.3)
CO2: 35 mmol/L — ABNORMAL HIGH (ref 22–32)
CREATININE: 0.48 mg/dL (ref 0.44–1.00)
Chloride: 94 mmol/L — ABNORMAL LOW (ref 98–111)
GFR calc Af Amer: >60 mL/min (ref >60)
GFR calc non Af Amer: >60 mL/min (ref >60)
Glucose, Bld: 114 mg/dL — ABNORMAL HIGH (ref 70–99)
Potassium: 4.2 mmol/L (ref 3.5–5.1)
SODIUM: 134 mmol/L — AB (ref 135–145)

## 2018-12-11 MED ORDER — DOXYCYCLINE HYCLATE 100 MG PO CAPS: 100.0000 mg | ORAL_CAPSULE | Freq: Two times a day (BID) | ORAL | 0 refills | Status: AC

## 2018-12-11 MED ORDER — METHYLPREDNISOLONE SODIUM SUCC 125 MG IJ SOLR
80.0000 mg | Freq: Once | INTRAMUSCULAR | Status: AC
  Administered 2018-12-11: 80 mg via INTRAVENOUS
  Filled 2018-12-11: qty 2

## 2018-12-11 MED ORDER — MAGNESIUM SULFATE IN D5W 1-5 GM/100ML-% IV SOLN
1.0000 g | INTRAVENOUS | Status: AC
  Administered 2018-12-11: 1 g via INTRAVENOUS
  Filled 2018-12-11: qty 100

## 2018-12-11 MED ORDER — ENSURE ENLIVE PO LIQD: 237.0000 mL | Freq: Three times a day (TID) | ORAL | 12 refills | Status: AC

## 2018-12-11 MED ORDER — LIDOCAINE 5 % EX PTCH: 1.0000 | MEDICATED_PATCH | CUTANEOUS | 0 refills | Status: AC

## 2018-12-11 MED ORDER — ALBUTEROL SULFATE (2.5 MG/3ML) 0.083% IN NEBU
5.0000 mg | INHALATION_SOLUTION | Freq: Once | RESPIRATORY_TRACT | Status: AC
  Administered 2018-12-11: 5 mg via RESPIRATORY_TRACT
  Filled 2018-12-11: qty 6

## 2018-12-11 MED ORDER — SODIUM CHLORIDE 0.9 % IV SOLN
500.0000 mg | Freq: Once | INTRAVENOUS | Status: AC
  Administered 2018-12-11: 500 mg via INTRAVENOUS
  Filled 2018-12-11: qty 500

## 2018-12-11 MED ORDER — IOHEXOL 350 MG/ML SOLN
75.0000 mL | Freq: Once | INTRAVENOUS | Status: AC | PRN
  Administered 2018-12-11: 75 mL via INTRAVENOUS

## 2018-12-11 MED ORDER — PREDNISONE 10 MG PO TABS: 10.0000 mg | ORAL_TABLET | Freq: Every day | ORAL | 0 refills | Status: AC

## 2018-12-11 MED ORDER — IPRATROPIUM-ALBUTEROL 0.5-2.5 (3) MG/3ML IN SOLN
3.0000 mL | Freq: Once | RESPIRATORY_TRACT | Status: AC
  Administered 2018-12-11: 3 mL via RESPIRATORY_TRACT
  Filled 2018-12-11: qty 3

## 2018-12-11 MED ORDER — LIDOCAINE 5 % EX PTCH
1.0000 | MEDICATED_PATCH | CUTANEOUS | Status: DC
  Administered 2018-12-11: 1 via TRANSDERMAL
  Filled 2018-12-11: qty 1

## 2018-12-11 NOTE — ED Provider Notes (Signed)
[facility] Emergency Department Provider Note  ____________________________________________  Time seen: Approximately 11:26 PM  I have reviewed the triage vital signs and the nursing notes.   HISTORY  Chief Complaint Shortness of Breath    HPI SHERRIA RIEMANN is a 78 y.o. female with a history of colon cancer, breast cancer, COPD, recently discharged from the hospital today after treatment of a COPD exacerbation who complains of worsening chest pain and shortness of breath since returning home.  She tried 2 nebulizer treatments without relief.  Also reports increased productive sputum and coughing.  No fever dizziness or syncope.  Symptoms are severe, no radiating pain, no alleviating factors      Past Medical History:  Diagnosis Date  . Allergic rhinitis   . Anxiety   . Asthma   . B12 deficiency   . Breast cancer ([place]) 1999   RT LUMPECTOMY  . Carcinoma of right breast treated with adjuvant chemotherapy ([place]) 1999   RT LUMPECTOMY  . CHF (congestive heart failure) ([place])   . Colon cancer ([place]) 2010  . Colon cancer ([place])   . COPD (chronic obstructive pulmonary disease) ([place])   . Hyperlipemia   . Hypertension   . MRSA pneumonia ([place])   . Osteoporosis   . Radiation 1999   BREAST CA  . Right adrenal mass [facility])      Patient Active Problem List   Diagnosis Date Noted  . Protein-calorie malnutrition, severe 12/11/2018  . Acute on chronic respiratory failure with hypoxia and hypercapnia ([place]) 12/10/2018  . Pressure injury of skin 12/10/2018  . Chest pain 10/08/2018  . COPD with acute exacerbation ([place]) 09/23/2018  . Acute respiratory failure with hypoxia ([place]) 09/14/2018  . Acute hypoxemic respiratory failure ([place]) 09/13/2018  . Acute exacerbation of chronic obstructive pulmonary disease (COPD) ([place]) 08/08/2018  . Leukocytosis 12/29/2016  . Cough 12/29/2016  . Hypoxia 12/06/2015  . COPD exacerbation ([place]) 12/03/2015  . Elevated troponin  12/03/2015  . Elevated antinuclear antibody (ANA) level 05/15/2015  . Shortness of breath 05/12/2015  . Hyperlipemia 05/12/2015  . COPD (chronic obstructive pulmonary disease) ([place]) 05/12/2015  . HTN (hypertension) 05/12/2015  . Osteoporosis 05/12/2015  . Right adrenal mass ([place]) 05/12/2015  . B12 deficiency 05/12/2015  . Allergic rhinitis 05/12/2015  . Breast cancer ([place]) 05/12/2015  . Colon cancer ([place]) 05/12/2015  . Closed head injury 12/15/2014     Past Surgical History:  Procedure Laterality Date  . ABDOMINAL HYSTERECTOMY    . APPENDECTOMY    . BREAST SURGERY Right    lumpectomy x2 with lymph nodes  . CARPAL TUNNEL RELEASE Left 06/04/2016   Procedure: CARPAL TUNNEL RELEASE;  Surgeon: Hessie Knows, MD;  Location: [facility] ORS;  Service: Orthopedics;  Laterality: Left;  . colectomy     partial  . IMAGE GUIDED SINUS SURGERY    . LEFT HEART CATH AND CORONARY ANGIOGRAPHY N/A 10/08/2018   Procedure: LEFT HEART CATH AND CORONARY ANGIOGRAPHY;  Surgeon: Corey Skains, MD;  Location: [place] CV LAB;  Service: Cardiovascular;  Laterality: N/A;  . OPEN REDUCTION INTERNAL FIXATION (ORIF) DISTAL RADIAL FRACTURE Left 06/04/2016   Procedure: OPEN REDUCTION INTERNAL FIXATION (ORIF) DISTAL RADIAL FRACTURE;  Surgeon: Hessie Knows, MD;  Location: [facility] ORS;  Service: Orthopedics;  Laterality: Left;     Prior to Admission medications   Medication Sig Start Date End Date Taking? Authorizing Provider  albuterol (PROAIR HFA) 108 (90 Base) MCG/ACT inhaler Inhale 2 puffs into the lungs every 6 (six) hours  as needed for wheezing or shortness of breath.  10/31/17   [provider]  albuterol (PROVENTIL) (2.5 MG/3ML) 0.083% nebulizer solution Take 2.5 mg by nebulization every 6 (six) hours as needed for wheezing or shortness of breath.    [provider]  ALPRAZolam Duanne Moron) 0.5 MG tablet Take 1 tablet by mouth 2 (two) times daily as needed for anxiety.  04/19/15   [provider]  aspirin EC 81 MG tablet Take 1 tablet (81 mg total) by mouth daily. 06/28/16   Fritzi Mandes, MD  atenolol (TENORMIN) 50 MG tablet Take 0.5 tablets (25 mg total) by mouth daily. 10/09/18   Fritzi Mandes, MD  azelastine (ASTELIN) 0.1 % nasal spray Place 1 spray into both nostrils 2 (two) times daily. Use in each nostril as directed    [provider]  butalbital-acetaminophen-caffeine (FIORICET, ESGIC) 50-325-40 MG tablet Take 1 tablet by mouth every 6 (six) hours as needed for migraine.  12/19/17   [provider]  cholecalciferol (VITAMIN D) 1000 units tablet Take 1,000 Units by mouth daily.    [provider]  clopidogrel (PLAVIX) 75 MG tablet Take 1 tablet (75 mg total) by mouth daily. 10/10/18   Fritzi Mandes, MD  doxycycline (VIBRAMYCIN) 100 MG capsule Take 1 capsule (100 mg total) by mouth 2 (two) times daily. 12/11/18   Carrie Mew, MD  escitalopram (LEXAPRO) 5 MG tablet Take 5 mg by mouth daily.    [provider]  feeding supplement, ENSURE ENLIVE, (ENSURE ENLIVE) LIQD Take 237 mLs by mouth 3 (three) times daily between meals. 12/11/18   Bettey Costa, MD  Fluticasone-Salmeterol (ADVAIR DISKUS) 250-50 MCG/DOSE AEPB Inhale 1 puff into the lungs 2 (two) times daily.  12/22/17   [provider]  HYDROcodone-acetaminophen (NORCO) 7.5-325 MG tablet Take 1 tablet by mouth 3 (three) times daily as needed for moderate pain.    [provider]  hydroxychloroquine (PLAQUENIL) 200 MG tablet Take 400 mg by mouth daily.    [provider]  lidocaine (LIDODERM) 5 % Place 1 patch onto the skin daily. Remove & Discard patch within 12 hours or as directed by MD 12/11/18   Bettey Costa, MD  losartan (COZAAR) 50 MG tablet Take 1 tablet (50 mg total) by mouth daily. 05/18/15   Tama High III, MD  nitroGLYCERIN (NITROSTAT) 0.4 MG SL tablet Place 1 tablet (0.4 mg total) under the tongue every 5 (five) minutes as needed for chest pain. 10/09/18    Fritzi Mandes, MD  predniSONE (DELTASONE) 10 MG tablet Take 1 tablet (10 mg total) by mouth daily with breakfast. 60 mg PO (ORAL) x 2 days 50 mg PO (ORAL)  x 2 days 40 mg PO (ORAL)  x 2 days 30 mg PO  (ORAL)  x 2 days 20 mg PO  (ORAL) x 2 days 10 mg PO  (ORAL) x 2 days then stop 12/11/18   Bettey Costa, MD     Allergies Ace inhibitors; Alendronate; Coreg [carvedilol]; Duloxetine; Morphine; Pregabalin; Statins; and Verapamil   Family History  Problem Relation Age of Onset  . Heart attack Mother   . Breast cancer Other   . Breast cancer Sister 39  . Breast cancer Sister 91    Social History Social History   Tobacco Use  . Smoking status: Current Some Day Smoker    Packs/day: 0.25    Types: Cigarettes  . Smokeless tobacco: Never Used  Substance Use Topics  . Alcohol use: No  .  Drug use: No    Review of Systems  Constitutional:   No fever or chills.  ENT:   No sore throat. No rhinorrhea. Cardiovascular:   Positive as above chest pain without  syncope. Respiratory: Positive shortness of breath and productive cough. Gastrointestinal:   Negative for abdominal pain, vomiting and diarrhea.  Musculoskeletal:   Negative for focal pain or swelling All other systems reviewed and are negative except as documented above in ROS and HPI.  ____________________________________________   PHYSICAL EXAM:  VITAL SIGNS: ED Triage Vitals  Enc Vitals Group     BP 12/11/18 1820 (!) 145/74     Pulse Rate 12/11/18 1820 92     Resp 12/11/18 1820 (!) 21     Temp 12/11/18 1820 97.6 F (36.4 C)     Temp Source 12/11/18 1820 Oral     SpO2 12/11/18 1820 98 %     Weight --      Height --      Head Circumference --      Peak Flow --      Pain Score 12/11/18 1825 8     Pain Loc --      Pain Edu? --      Excl. in [place]? --     Vital signs reviewed, nursing assessments reviewed.   Constitutional:   Alert and oriented.  Chronically ill-appearing, emaciated Eyes:   Conjunctivae are normal.  EOMI. PERRL. ENT      Head:   Normocephalic and atraumatic.      Nose:   No congestion/rhinnorhea.       Mouth/Throat:   MMM, no pharyngeal erythema. No peritonsillar mass.       Neck:   No meningismus. Full ROM. Hematological/Lymphatic/Immunilogical:   No cervical lymphadenopathy. Cardiovascular:   RRR. Symmetric bilateral radial and DP pulses.  No murmurs. Cap refill less than 2 seconds. Respiratory: Tachypnea, diffuse expiratory wheezing, slightly prolonged expiratory phase.  No focal crackles.  Symmetric air entry in all lung fields. Gastrointestinal:   Soft and nontender. Non distended. There is no CVA tenderness.  No rebound, rigidity, or guarding. Musculoskeletal:   Normal range of motion in all extremities. No joint effusions.  No lower extremity tenderness.  No edema. Neurologic:   Normal speech and language.  Motor grossly intact. No acute focal neurologic deficits are appreciated.  Skin:    Skin is warm, dry and intact. No rash noted.  No petechiae, purpura, or bullae.  ____________________________________________    LABS (pertinent positives/negatives) (all labs ordered are listed, but only abnormal results are displayed) Labs Reviewed  BASIC METABOLIC PANEL - Abnormal; Notable for the following components:      Result Value   Sodium 134 (*)    Chloride 94 (*)    CO2 35 (*)    Glucose, Bld 114 (*)    All other components within normal limits  CBC WITH DIFFERENTIAL/PLATELET - Abnormal; Notable for the following components:   WBC 13.2 (*)    Hemoglobin 11.9 (*)    Neutro Abs 11.8 (*)    All other components within normal limits   ____________________________________________   EKG  Interpreted by me Normal sinus rhythm rate of 95, normal axis and intervals.  Normal QRS ST segments and T waves.  ____________________________________________    RADIOLOGY  Dg Chest 2 View  Result Date: 12/11/2018 CLINICAL DATA:  Dyspnea EXAM: CHEST - 2 VIEW COMPARISON:   12/10/2018 FINDINGS: Stable pleuroparenchymal opacities consistent with scarring and post treatment change in the  apices, right more prominent than left. Axillary clips are seen on the right. Emphysematous hyperinflation of the lungs with retracted appearance of the pulmonary hila remain stable. No new pulmonary confluence, dominant mass, effusion or pneumothorax is identified. No acute osseous abnormality is seen. Heart size is within normal limits. Tortuous atherosclerotic aorta is noted without definite aneurysmal dilatation. IMPRESSION: Stable emphysematous hyperinflation of the lungs with biapical pleuroparenchymal scarring and post treatment change. No acute pulmonary disease. Electronically Signed   By: Ashley Royalty M.D.   On: 12/11/2018 19:01   Ct Angio Chest Pe W And/or Wo Contrast  Result Date: 12/11/2018 CLINICAL DATA:  Initial evaluation for acute pleuritic chest pain, recent hospitalization. High pretest probability for pulmonary embolism. EXAM: CT ANGIOGRAPHY CHEST WITH CONTRAST TECHNIQUE: Multidetector CT imaging of the chest was performed using the standard protocol during bolus administration of intravenous contrast. Multiplanar CT image reconstructions and MIPs were obtained to evaluate the vascular anatomy. CONTRAST:  32mL OMNIPAQUE IOHEXOL 350 MG/ML SOLN COMPARISON:  Prior radiograph from earlier same day as well as previous CT from 10/08/2018. FINDINGS: Cardiovascular: Intrathoracic aorta normal in caliber without aneurysm or other acute finding. Advanced aortic atherosclerosis. Partially visualized great vessels within normal limits. Heart size normal. Extensive 3 vessel coronary artery calcifications. No pericardial effusion. Pulmonary arterial tree adequately opacified for evaluation. Main pulmonary artery measures within normal limits for caliber of 2.4 cm in diameter. No filling defect to suggest acute pulmonary embolism. Re-formatted imaging confirms these findings. 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 Mediastinum/Nodes: Visualized thyroid normal. No enlarged mediastinal, hilar, or axillary nodes noted. Surgical clips noted at the right axilla. Esophagus within normal limits. Lungs/Pleura: Tracheobronchial tree intact and patent. Lungs are hyperinflated with extensive upper lobe predominant emphysematous changes irregular biapical pleuroparenchymal thickening and scarring with associated bronchiectasis and superior retraction of the hila. Right upper lobe largely collapsed with associated dense consolidation, stable. Superimposed 13 x 15 mm nodular density at the superior segment of the left lower lobe is unchanged (series 6, image 21). Additional triangular pleural-based consolidative opacity at the peripheral right lower lobe also relatively stable measuring 20 x 18 mm (series 6, image 33). Stable 4 mm nodule at the left lung apex (series 6, image 15). Scattered subcentimeter nodular densities within the subpleural regions of the bilateral lower lobes could reflect mucoid impaction and/or sequelae of infection/bronchiolitis. No edema or effusion. Upper Abdomen: Visualized upper abdomen demonstrates no acute finding. Stable right adrenal nodule. Musculoskeletal: No acute osseous abnormality. No discrete lytic or blastic osseous lesions. Review of the MIP images confirms the above findings. IMPRESSION: 1. No CT evidence for acute pulmonary embolism. 2. Scattered nodular densities within the subpleural regions of the lower lobes bilaterally, nonspecific, and could reflect mucoid impaction and/or sequelae of acute infection/bronchiolitis. 3. 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 4. Underlying severe upper lobe predominant emphysema, stable. 5. Unchanged left lower lobe superior segment nodule and peripheral right lower lobe  nodule as above. 6. Aortic atherosclerosis with diffuse coronary artery calcifications. Electronically Signed   By: Jeannine Boga M.D.   On: 12/11/2018 23:16    ____________________________________________   PROCEDURES Procedures  ____________________________________________  DIFFERENTIAL DIAGNOSIS   Pulmonary embolism, pneumothorax, pneumonia, COPD exacerbation  CLINICAL IMPRESSION / ASSESSMENT AND PLAN / ED COURSE  Pertinent labs & imaging results that were available during my care of the patient were reviewed  by me and considered in my medical decision making (see chart for details).    Patient presents with likely COPD exacerbation.  However with her recent hospitalization and persistent symptoms that have not improved, history of cancer, raises concern for pulmonary embolism.  Will obtain CT scan to evaluate for PE versus occult pneumonia.  Go ahead give IV azithromycin although patient reports this usually does not work well for her but may have an anti-inflammatory effect.  Solu-Medrol, mag, nebs.  If CT negative and symptoms improved, may discharge on Doxy.  If failure to improve or concerning findings on CT, plan to admit.  Care signed out to Dr. Owens Shark at 11 PM      ____________________________________________   FINAL CLINICAL IMPRESSION(S) / ED DIAGNOSES    Final diagnoses:  COPD exacerbation [facility])     ED Discharge Orders         Ordered    doxycycline (VIBRAMYCIN) 100 MG capsule  2 times daily     12/11/18 2326          Portions of this note were generated with dragon dictation software. Dictation errors may occur despite best attempts at proofreading.   Carrie Mew, MD 12/11/18 2333

## 2018-12-11 NOTE — Discharge Summary (Signed)
Robin [facility] at [facility] NAME: Robin Hudson    MR#:  [id number]  DATE OF BIRTH:  [birth year]  DATE OF ADMISSION:  12/09/2018 ADMITTING PHYSICIAN: Harrie Foreman, MD  DATE OF DISCHARGE: December 11, 2018  PRIMARY CARE PHYSICIAN: Tama High III, MD    ADMISSION DIAGNOSIS:  COPD exacerbation ([place]) [J44.1] Acute on chronic respiratory failure with hypoxia and hypercapnia (HCC) [J96.21, J96.22]  DISCHARGE DIAGNOSIS:  Active Problems:   Acute on chronic respiratory failure with hypoxia and hypercapnia (HCC)   Pressure injury of skin   Protein-calorie malnutrition, severe   SECONDARY DIAGNOSIS:   Past Medical History:  Diagnosis Date  . Allergic rhinitis   . Anxiety   . Asthma   . B12 deficiency   . Breast cancer ([place]) 1999   RT LUMPECTOMY  . Carcinoma of right breast treated with adjuvant chemotherapy ([place]) 1999   RT LUMPECTOMY  . CHF (congestive heart failure) ([place])   . Colon cancer ([place]) 2010  . Colon cancer ([place])   . COPD (chronic obstructive pulmonary disease) ([place])   . Hyperlipemia   . Hypertension   . MRSA pneumonia ([place])   . Osteoporosis   . Radiation 1999   BREAST CA  . Right adrenal mass [facility])     HOSPITAL COURSE:   78 year old female with history of COPD and chronic hypoxic respiratory failure who presented with shortness of breath and cough.  1.  Acute on chronic hypoxic respiratory failure in setting of acute exacerbation of COPD: Patient is now back on her baseline oxygen.  2.  Acute exacerbation of COPD: Patient symptoms have improved.  She will be discharged on prednisone taper. Continue inhalers 3.  History of systolic congestive heart failure EF of 40 to 45% without signs of exacerbation  4.  Depression: Continue Lexapro  5.  Essential hypertension: Continue losartan and atenolol  6.  Chronic back pain: Patient is started on lidocaine patch and will continue hydrocodone/acetaminophen as  needed  DISCHARGE CONDITIONS AND DIET:   Stable for discharge on regular diet  CONSULTS OBTAINED:    DRUG ALLERGIES:   Allergies  Allergen Reactions  . Ace Inhibitors     Other reaction(s): Cough  . Alendronate     Other reaction(s): Other (See Comments) GI UPSET  . Coreg [Carvedilol] Other (See Comments)    syncope  . Duloxetine Other (See Comments)  . Morphine Other (See Comments)    confusion  . Pregabalin     Other reaction(s): Other (See Comments) intolerant  . Statins Other (See Comments)  . Verapamil     Other reaction(s): Other (See Comments) CONSTIPATION    DISCHARGE MEDICATIONS:   Allergies as of 12/11/2018      Reactions   Ace Inhibitors    Other reaction(s): Cough   Alendronate    Other reaction(s): Other (See Comments) GI UPSET   Coreg [carvedilol] Other (See Comments)   syncope   Duloxetine Other (See Comments)   Morphine Other (See Comments)   confusion   Pregabalin    Other reaction(s): Other (See Comments) intolerant   Statins Other (See Comments)   Verapamil    Other reaction(s): Other (See Comments) CONSTIPATION      Medication List    TAKE these medications   ADVAIR DISKUS 250-50 MCG/DOSE Aepb Generic drug:  Fluticasone-Salmeterol Inhale 1 puff into the lungs 2 (two) times daily.   albuterol (2.5 MG/3ML) 0.083% nebulizer solution Commonly known as:  PROVENTIL Take 2.5  mg by nebulization every 6 (six) hours as needed for wheezing or shortness of breath.   PROAIR HFA 108 (90 Base) MCG/ACT inhaler Generic drug:  albuterol Inhale 2 puffs into the lungs every 6 (six) hours as needed for wheezing or shortness of breath.   ALPRAZolam 0.5 MG tablet Commonly known as:  XANAX Take 1 tablet by mouth 2 (two) times daily as needed for anxiety.   aspirin EC 81 MG tablet Take 1 tablet (81 mg total) by mouth daily.   atenolol 50 MG tablet Commonly known as:  TENORMIN Take 0.5 tablets (25 mg total) by mouth daily.   azelastine 0.1 %  nasal spray Commonly known as:  ASTELIN Place 1 spray into both nostrils 2 (two) times daily. Use in each nostril as directed   butalbital-acetaminophen-caffeine 50-325-40 MG tablet Commonly known as:  FIORICET, ESGIC Take 1 tablet by mouth every 6 (six) hours as needed for migraine.   cholecalciferol 1000 units tablet Commonly known as:  VITAMIN D Take 1,000 Units by mouth daily.   clopidogrel 75 MG tablet Commonly known as:  PLAVIX Take 1 tablet (75 mg total) by mouth daily.   escitalopram 5 MG tablet Commonly known as:  LEXAPRO Take 5 mg by mouth daily.   feeding supplement (ENSURE ENLIVE) Liqd Take 237 mLs by mouth 3 (three) times daily between meals.   HYDROcodone-acetaminophen 7.5-325 MG tablet Commonly known as:  NORCO Take 1 tablet by mouth 3 (three) times daily as needed for moderate pain.   hydroxychloroquine 200 MG tablet Commonly known as:  PLAQUENIL Take 400 mg by mouth daily.   lidocaine 5 % Commonly known as:  LIDODERM Place 1 patch onto the skin daily. Remove & Discard patch within 12 hours or as directed by MD   losartan 50 MG tablet Commonly known as:  COZAAR Take 1 tablet (50 mg total) by mouth daily.   nitroGLYCERIN 0.4 MG SL tablet Commonly known as:  NITROSTAT Place 1 tablet (0.4 mg total) under the tongue every 5 (five) minutes as needed for chest pain.   predniSONE 10 MG tablet Commonly known as:  DELTASONE Take 1 tablet (10 mg total) by mouth daily with breakfast. 60 mg PO (ORAL) x 2 days 50 mg PO (ORAL)  x 2 days 40 mg PO (ORAL)  x 2 days 30 mg PO  (ORAL)  x 2 days 20 mg PO  (ORAL) x 2 days 10 mg PO  (ORAL) x 2 days then stop         Today   CHIEF COMPLAINT:   Patient's breathing has improved. Complaining of back pain.  Denies numbness or tingling down her legs.   VITAL SIGNS:  Blood pressure 130/79, pulse 88, temperature 97.7 F (36.5 C), temperature source Oral, resp. rate 17, height 5\' 5"  (1.651 m), weight 45.2 kg, SpO2  97 %.   REVIEW OF SYSTEMS:  Review of Systems  Constitutional: Negative.  Negative for chills, fever and malaise/fatigue.  HENT: Negative.  Negative for ear discharge, ear pain, hearing loss, nosebleeds and sore throat.   Eyes: Negative.  Negative for blurred vision and pain.  Respiratory: Positive for cough. Negative for hemoptysis, shortness of breath and wheezing.   Cardiovascular: Negative.  Negative for chest pain, palpitations and leg swelling.  Gastrointestinal: Negative.  Negative for abdominal pain, blood in stool, diarrhea, nausea and vomiting.  Genitourinary: Negative.  Negative for dysuria.  Musculoskeletal: Positive for back pain.  Skin: Negative.   Neurological: Negative for dizziness, tremors,  speech change, focal weakness, seizures and headaches.  Endo/Heme/Allergies: Negative.  Does not bruise/bleed easily.  Psychiatric/Behavioral: Negative.  Negative for depression, hallucinations and suicidal ideas.     PHYSICAL EXAMINATION:  GENERAL:  78 y.o.-year-old patient lying in the bed with no acute distress.  Frail NECK:  Supple, no jugular venous distention. No thyroid enlargement, no tenderness.  LUNGS: Normal breath sounds bilaterally, no wheezing, rales,rhonchi  No use of accessory muscles of respiration.  CARDIOVASCULAR: S1, S2 normal. No murmurs, rubs, or gallops.  ABDOMEN: Soft, non-tender, non-distended. Bowel sounds present. No organomegaly or mass.  EXTREMITIES: No pedal edema, cyanosis, or clubbing.  PSYCHIATRIC: The patient is alert and oriented x 3.  SKIN: No obvious rash, lesion, or ulcer.   DATA REVIEW:   CBC Recent Labs  Lab 12/09/18 2340  WBC 14.5*  HGB 11.5*  HCT 37.4  PLT 289    Chemistries  Recent Labs  Lab 12/09/18 2340 12/11/18 0740  NA 137  --   K 4.3 4.3  CL 97*  --   CO2 36*  --   GLUCOSE 119*  --   BUN 18  --   CREATININE 0.71  --   CALCIUM 8.7*  --   MG 1.8 1.8  AST 26  --   ALT 16  --   ALKPHOS 55  --   BILITOT 0.5  --      Cardiac Enzymes Recent Labs  Lab 12/09/18 2340  TROPONINI <0.03    Microbiology Results  @[email or web address] @  RADIOLOGY:  Dg Chest Port 1 View  Result Date: 12/10/2018 CLINICAL DATA:  Severe shortness of breath EXAM: PORTABLE CHEST 1 VIEW COMPARISON:  11/07/2018, CT 10/30/2017, 08/08/2018 FINDINGS: Hyperinflation with emphysematous disease. Right greater than left apical pleural and parenchymal scarring with hilar retraction as before. No acute airspace disease. Stable cardiomediastinal silhouette with aortic atherosclerosis. No pneumothorax. Bronchiectasis in the upper lobes. IMPRESSION: Similar appearance of right greater than left biapical pleural and parenchymal scarring with retraction of the pulmonary hila. No acute airspace disease. Electronically Signed   By: Donavan Foil M.D.   On: 12/10/2018 00:26      Allergies as of 12/11/2018      Reactions   Ace Inhibitors    Other reaction(s): Cough   Alendronate    Other reaction(s): Other (See Comments) GI UPSET   Coreg [carvedilol] Other (See Comments)   syncope   Duloxetine Other (See Comments)   Morphine Other (See Comments)   confusion   Pregabalin    Other reaction(s): Other (See Comments) intolerant   Statins Other (See Comments)   Verapamil    Other reaction(s): Other (See Comments) CONSTIPATION      Medication List    TAKE these medications   ADVAIR DISKUS 250-50 MCG/DOSE Aepb Generic drug:  Fluticasone-Salmeterol Inhale 1 puff into the lungs 2 (two) times daily.   albuterol (2.5 MG/3ML) 0.083% nebulizer solution Commonly known as:  PROVENTIL Take 2.5 mg by nebulization every 6 (six) hours as needed for wheezing or shortness of breath.   PROAIR HFA 108 (90 Base) MCG/ACT inhaler Generic drug:  albuterol Inhale 2 puffs into the lungs every 6 (six) hours as needed for wheezing or shortness of breath.   ALPRAZolam 0.5 MG tablet Commonly known as:  XANAX Take 1 tablet by mouth 2 (two) times daily as needed  for anxiety.   aspirin EC 81 MG tablet Take 1 tablet (81 mg total) by mouth daily.   atenolol 50 MG tablet Commonly known as:  TENORMIN Take 0.5 tablets (25 mg total) by mouth daily.   azelastine 0.1 % nasal spray Commonly known as:  ASTELIN Place 1 spray into both nostrils 2 (two) times daily. Use in each nostril as directed   butalbital-acetaminophen-caffeine 50-325-40 MG tablet Commonly known as:  FIORICET, ESGIC Take 1 tablet by mouth every 6 (six) hours as needed for migraine.   cholecalciferol 1000 units tablet Commonly known as:  VITAMIN D Take 1,000 Units by mouth daily.   clopidogrel 75 MG tablet Commonly known as:  PLAVIX Take 1 tablet (75 mg total) by mouth daily.   escitalopram 5 MG tablet Commonly known as:  LEXAPRO Take 5 mg by mouth daily.   feeding supplement (ENSURE ENLIVE) Liqd Take 237 mLs by mouth 3 (three) times daily between meals.   HYDROcodone-acetaminophen 7.5-325 MG tablet Commonly known as:  NORCO Take 1 tablet by mouth 3 (three) times daily as needed for moderate pain.   hydroxychloroquine 200 MG tablet Commonly known as:  PLAQUENIL Take 400 mg by mouth daily.   lidocaine 5 % Commonly known as:  LIDODERM Place 1 patch onto the skin daily. Remove & Discard patch within 12 hours or as directed by MD   losartan 50 MG tablet Commonly known as:  COZAAR Take 1 tablet (50 mg total) by mouth daily.   nitroGLYCERIN 0.4 MG SL tablet Commonly known as:  NITROSTAT Place 1 tablet (0.4 mg total) under the tongue every 5 (five) minutes as needed for chest pain.   predniSONE 10 MG tablet Commonly known as:  DELTASONE Take 1 tablet (10 mg total) by mouth daily with breakfast. 60 mg PO (ORAL) x 2 days 50 mg PO (ORAL)  x 2 days 40 mg PO (ORAL)  x 2 days 30 mg PO  (ORAL)  x 2 days 20 mg PO  (ORAL) x 2 days 10 mg PO  (ORAL) x 2 days then stop          Management plans discussed with the patient and she is in agreement. Stable for discharge  home with [facility]  Patient should follow up with pcp  CODE STATUS:     Code Status Orders  (From admission, onward)         Start     Ordered   12/10/18 0443  Full code  Continuous     12/10/18 0442        Code Status History    Date Active Date Inactive Code Status Order ID Comments User Context   10/08/2018 0425 10/09/2018 1821 Full Code [id number]  Arta Silence, MD Inpatient   09/24/2018 1414 09/26/2018 1603 Full Code [id number]  Gladstone Lighter, MD Inpatient   09/24/2018 0044 09/24/2018 1414 DNR [id number]  Amelia Jo, MD Inpatient   09/13/2018 1202 09/14/2018 1604 DNR [id number]  Epifanio Lesches, MD Inpatient   09/13/2018 0612 09/13/2018 1202 Full Code [id number]  Arta Silence, MD Inpatient   12/29/2016 0146 12/29/2016 1429 Full Code [id number]  Lance Coon, MD Inpatient   07/19/2016 2256 07/20/2016 1635 Full Code [id number]  [place], [place], DO Inpatient   12/06/2015 0200 12/07/2015 1534 Full Code [id number]  Harrie Foreman, MD Inpatient   12/03/2015 0049 12/04/2015 1721 Full Code [id number]  Hower, Aaron Mose, MD ED   05/12/2015 1509 05/18/2015 1239 Full Code [id number]  Dustin Flock, MD Inpatient      TOTAL TIME TAKING CARE OF THIS PATIENT: 38 minutes.    Note: This dictation was prepared with Dragon dictation along with smaller phrase technology.  Any transcriptional errors that result from this process are unintentional.  Gala Padovano M.D on 12/11/2018 at 10:10 AM  Between 7am to 6pm - Pager - [phone] After 6pm go to [email or web address] - password [email or web address] [place] [facility]  Office  [phone]  CC: Primary care physician; Adin Hector, MD

## 2018-12-11 NOTE — ED Notes (Signed)
ED Provider at bedside. 

## 2018-12-11 NOTE — Care Management (Signed)
[facility], Malachy Mood, has accepted the patient for [facility] PT services

## 2018-12-11 NOTE — ED Notes (Signed)
Patient transported to CT 

## 2018-12-11 NOTE — Care Management (Signed)
Met with Patient to discuss HH, I explained what HH PT does.  I provided her the [facility] list per CMS.gov and she has chosen [facility], Notified [facility], Corene Cornea of the patient choice.

## 2018-12-11 NOTE — Progress Notes (Addendum)
Prior to dc called R/T for breathing treatment. O2 sats were stable at 96% but pt was SOB.  Patient is being discharged to home with [facility]. DC & Rx instructions given and patient acknowledged understanding. She said she would get OTC pain patch b/c Lidocaine patch too expense and that she had prednisone at home. Educated patient on the importance of having enough and taking prednisone the way it is prescribed.   VS elevated. Patient back in bed to recheck in a few minutes. Called physician and waiting call back.

## 2018-12-11 NOTE — Care Management (Signed)
[facility] is unable to accept the patient.  They have went out to the home on 2 occasions and the patient refused their services. Patient has been notified that [facility] can't accept them She chooses [facility] as a second choice, Notified Cheryl with [facility] and requested to be notified if they can accept the patient

## 2018-12-11 NOTE — Progress Notes (Addendum)
Central tele monitor call to notify this RN of 8 beat run of V tach. MD notified. No new orders. Will continue to monitor.

## 2018-12-11 NOTE — ED Notes (Signed)
Pt assisted to toilet on bedpan

## 2018-12-11 NOTE — ED Triage Notes (Signed)
Pt was discharged from the hospital today where she was admitted for sob and CP and back pain. Pt states that these symptoms continue and pt returns to ED.  Pt is on home O2, 2L [place], able to speak in full sentences but appears tachypneic

## 2018-12-12 MED ORDER — ENSURE ENLIVE PO LIQD: 237.0000 mL | Freq: Two times a day (BID) | ORAL | Status: DC

## 2018-12-12 MED ORDER — LIDOCAINE 5 % EX PTCH
1.0000 | MEDICATED_PATCH | CUTANEOUS | Status: DC
  Administered 2018-12-12: 1 via TRANSDERMAL
  Filled 2018-12-12: qty 1

## 2018-12-12 MED ORDER — OXYCODONE-ACETAMINOPHEN 5-325 MG PO TABS
1.0000 | ORAL_TABLET | Freq: Once | ORAL | Status: AC
  Administered 2018-12-12: 1 via ORAL
  Filled 2018-12-12: qty 1

## 2018-12-12 MED ORDER — OXYCODONE-ACETAMINOPHEN 5-325 MG PO TABS
1.0000 | ORAL_TABLET | Freq: Four times a day (QID) | ORAL | Status: DC | PRN
  Administered 2018-12-12: 1 via ORAL
  Filled 2018-12-12: qty 1

## 2018-12-12 NOTE — ED Notes (Signed)
Pt c/o 8-9/10 chronic lower back pain

## 2018-12-12 NOTE — Discharge Instructions (Signed)
We will arrange for home health, skilled nursing and improvement in your oxygen therapy.  Please follow-up with your regular doctor return here for any worsening.

## 2018-12-12 NOTE — ED Notes (Signed)
Husband leaving bedside to go home, POC at this point will be to keep pt for readmission

## 2018-12-12 NOTE — ED Notes (Signed)
Pt verbalized understanding of discharge instructions. NAD at this time. 

## 2018-12-12 NOTE — Care Management Note (Signed)
Case Management Note  Patient Details  Name: Robin Hudson MRN: [id number] Date of Birth: June 14, 1941  Subjective/Objective:    Patient is from home with husband with SOB, COPD.  She is on chronic oxygen  At 2L through [facility].  She is unhappy with her current O2 provider.  States her tubing doesn't work well and she has contacted them several times and the problem  Has not been resolved.  She is also in need of [place] services.  Provided choice and they would like [facility] for [facility] RN, PT, SW and for DME oxygem.  Referral made to [facility] with [facility] and accepted.  She has a neb at home along with walker and wheelchair.  Cuurent with PCP, Dr. Karen Chafe and denies difficulties obtaining medications at [facility] and [facility].               Action/Plan:   Expected Discharge Date:                  Expected Discharge Plan:  [place]  In-House Referral:     Discharge planning Services  CM Consult  Post Acute Care Choice:  Home Health Choice offered to:  Patient, Spouse  DME Arranged:  3-N-1, Oxygen DME Agency:  [place]:  RN, PT, Nurse's Aide [place] Agency:  [facility]  Status of Service:  Completed, signed off  If discussed at [place] of Stay Meetings, dates discussed:    Additional Comments:  [name], RN 12/12/2018, 11:49 AM

## 2018-12-12 NOTE — Progress Notes (Signed)
LCSW came to consult with patient and her family. Patient is a 78 year old female who has some serious breathing issues. She has good family support and they will rally together to do a good home cleaning and then hire someone to come and clean every week.  The patient reported she came to ED because she has had challenges since she DC from hospital and wants some in home support. She reports she has O2 and 25 foot cord- her current set up doesn't work very well. She also has a nebulizer machine and despite 2 treatments she wasn't doing great at home.   She would like to DC home. LCSW consulted with care manager and EDP and Dr Cinda Quest agreed this was a care management consult and not SW. LCSW provided face sheet with all patient information and patient needs and care manager will call her today to see what else patient would benefit from.  Patient can discharge home and care management will follow up  [place] [phone]

## 2018-12-15 DIAGNOSIS — I2510 Atherosclerotic heart disease of native coronary artery without angina pectoris: Secondary | ICD-10-CM | POA: Diagnosis not present

## 2018-12-15 DIAGNOSIS — I119 Hypertensive heart disease without heart failure: Secondary | ICD-10-CM | POA: Diagnosis not present

## 2018-12-15 DIAGNOSIS — M5136 Other intervertebral disc degeneration, lumbar region: Secondary | ICD-10-CM | POA: Diagnosis not present

## 2018-12-15 DIAGNOSIS — E782 Mixed hyperlipidemia: Secondary | ICD-10-CM | POA: Diagnosis not present

## 2018-12-15 DIAGNOSIS — M0579 Rheumatoid arthritis with rheumatoid factor of multiple sites without organ or systems involvement: Secondary | ICD-10-CM | POA: Diagnosis not present

## 2018-12-15 DIAGNOSIS — Z85038 Personal history of other malignant neoplasm of large intestine: Secondary | ICD-10-CM | POA: Diagnosis not present

## 2018-12-15 DIAGNOSIS — Z853 Personal history of malignant neoplasm of breast: Secondary | ICD-10-CM | POA: Diagnosis not present

## 2018-12-15 DIAGNOSIS — J9611 Chronic respiratory failure with hypoxia: Secondary | ICD-10-CM | POA: Diagnosis not present

## 2018-12-15 DIAGNOSIS — C3432 Malignant neoplasm of lower lobe, left bronchus or lung: Secondary | ICD-10-CM | POA: Diagnosis not present

## 2018-12-15 DIAGNOSIS — I10 Essential (primary) hypertension: Secondary | ICD-10-CM | POA: Diagnosis not present

## 2018-12-15 DIAGNOSIS — E43 Unspecified severe protein-calorie malnutrition: Secondary | ICD-10-CM | POA: Diagnosis not present

## 2018-12-15 DIAGNOSIS — J439 Emphysema, unspecified: Secondary | ICD-10-CM | POA: Diagnosis not present

## 2018-12-22 ENCOUNTER — Other Ambulatory Visit: Payer: PPO | Admitting: Student

## 2018-12-22 DIAGNOSIS — Z515 Encounter for palliative care: Secondary | ICD-10-CM

## 2018-12-22 NOTE — Progress Notes (Signed)
[facility] Telephone: [phone] Fax: [phone]  PATIENT NAME: Robin Hudson DOB: 1940-12-17 MRN: [id number]  PRIMARY CARE PROVIDER:   Adin Hector, MD  REFERRING PROVIDER:  Adin Hector, MD [place], [place] [address]  RESPONSIBLE PARTY: Self    ASSESSMENT: Robin Hudson is resting in recliner upon arrival; she arouses to stimulation. She is alert and oriented x 3. At times during conversation, she does appear to be short of breath at rest when speaking and pauses in between words. Explained role of Palliative Medicine. Discussed disease progression. We discussed goals of care; she would like to remain home, receive additional support and reduce hospitalizations/emergency room visits. Discussed home health versus Hospice. She is open to Hospice services. Discussed Code status; she is a DNR.       RECOMMENDATIONS and PLAN:  1. Code status: DNR. 2. Medical goals of therapy: Patient being referred for Hospice assessment due to end stage COPD and protein calorie malnutrition. 3. Symptom management: dyspnea-continue inhalers and nebulizer as directed. Continue oxygen via nasal canula at 2 liters per minute. Pain- Recommendation for tramadol 50mg  every 8 hours PRN. Anxiety-continue xanax 0.5mg  every BID prn. 4. Discharge planning: Patient will continue to reside at home. 5. Emotional/spiritual support: Discussed with Robin Hudson and son Robin Hudson;  they are encouraged to call with questions.   Palliative Care to continue to follow through admission to Hospice.   I spent 60 minutes providing this consultation,  from 1:15pm to 2:15pm. More than 50% of the time in this consultation was spent coordinating communication.   HISTORY OF PRESENT ILLNESS:  Robin Hudson is a 78 y.o. year old female with multiple medical problems including acute on chronic respiratory failure due to COPD exacerbation, severe protein calorie malnutrition,  hypertension, coronary artery disease, vitamin B12 deficiency, degenerative disc disease, hyperlipidemia, osteoporosis, allergic rhinitis, rheumatoid arthritis, depression, anxiety, left lower lobe lung cancer, adrenal nodule, history of colon and breast cancer.  Patient reports multiple hospitalizations; most recent discharge on 12/11/2018. Son Robin Hudson reports patient having six emergency room visits in the last 3 months. Robin Hudson is currently being followed by pulmonology, Dr. Raul Hudson and Dr. Ubaldo Glassing with cardiology. Repeat CT scan shows significant chronic changes, bronchiectasis, with a stable lung mass. Robin Hudson currently lives at home with her husband. She is having more difficulty completing adl's. She has someone cleaning her home, family has been bringing meals. She reports worsening fatigue; she fatigues very easily, with any exertion. Rollator walker used for ambulation. She is wearing oxygen via nasal canula at 2 liters per minute; oxygen dependent x three years. She reports dyspnea at rest and with exertion. When asked how often is she using her inhaler, she states "too much." She reports eating 50% of some meals, patient reports having a 40 lb weight loss in the last four to six months. She reports neck and back pain; has been taking hydrocodone-acetaminophen prn but does not feel this is working as she has been taking so long. She takes prn xanax for her "nerves." She reports sleeping more throughout the day. She states she stopped taking her clopidogrel 3 or 4 days ago due to it causing diarrhea and nausea. She reports smoking 1/2 a cigarette occasionally; she states she removes her oxygen when smoking and leaves off for several minutes before turning oxygen back on. Robin Hudson states she is tired of being sick and needs extra help. She has a current  referral for home health. Son Robin Hudson states he was contacted by [facility] home health, but they were out of patient's network. Palliative care consulted to  address goals of care.    CODE STATUS: DNR  PPS: 40% HOSPICE ELIGIBILITY/DIAGNOSIS: YES, COPD and protein calorie malnutrition.  PAST MEDICAL HISTORY:  Past Medical History:  Diagnosis Date  . Allergic rhinitis   . Anxiety   . Asthma   . B12 deficiency   . Breast cancer ([place]) 1999   RT LUMPECTOMY  . Carcinoma of right breast treated with adjuvant chemotherapy ([place]) 1999   RT LUMPECTOMY  . CHF (congestive heart failure) ([place])   . Colon cancer ([place]) 2010  . Colon cancer ([place])   . COPD (chronic obstructive pulmonary disease) ([place])   . Hyperlipemia   . Hypertension   . MRSA pneumonia ([place])   . Osteoporosis   . Radiation 1999   BREAST CA  . Right adrenal mass [facility])     SOCIAL HX:  Social History   Tobacco Use  . Smoking status: Current Some Day Smoker    Packs/day: 0.25    Types: Cigarettes  . Smokeless tobacco: Never Used  Substance Use Topics  . Alcohol use: No    ALLERGIES:  Allergies  Allergen Reactions  . Ace Inhibitors     Other reaction(s): Cough  . Alendronate     Other reaction(s): Other (See Comments) GI UPSET  . Coreg [Carvedilol] Other (See Comments)    syncope  . Duloxetine Other (See Comments)  . Morphine Other (See Comments)    confusion  . Pregabalin     Other reaction(s): Other (See Comments) intolerant  . Statins Other (See Comments)  . Verapamil     Other reaction(s): Other (See Comments) CONSTIPATION     PERTINENT MEDICATIONS:  Outpatient Encounter Medications as of 12/22/2018  Medication Sig  . albuterol (PROAIR HFA) 108 (90 Base) MCG/ACT inhaler Inhale 2 puffs into the lungs every 6 (six) hours as needed for wheezing or shortness of breath.   Marland Kitchen albuterol (PROVENTIL) (2.5 MG/3ML) 0.083% nebulizer solution Take 2.5 mg by nebulization every 6 (six) hours as needed for wheezing or shortness of breath.  . ALPRAZolam (XANAX) 0.5 MG tablet Take 1 tablet by mouth 2 (two) times daily as needed for anxiety.   Marland Kitchen aspirin EC 81 MG tablet Take 1  tablet (81 mg total) by mouth daily.  Marland Kitchen atenolol (TENORMIN) 50 MG tablet Take 0.5 tablets (25 mg total) by mouth daily.  Marland Kitchen azelastine (ASTELIN) 0.1 % nasal spray Place 1 spray into both nostrils 2 (two) times daily. Use in each nostril as directed  . budesonide-formoterol (SYMBICORT) 160-4.5 MCG/ACT inhaler Inhale 2 puffs into the lungs 2 (two) times daily.  . cholecalciferol (VITAMIN D) 1000 units tablet Take 1,000 Units by mouth daily.  Marland Kitchen escitalopram (LEXAPRO) 5 MG tablet Take 5 mg by mouth daily.  Marland Kitchen HYDROcodone-acetaminophen (NORCO) 7.5-325 MG tablet Take 1 tablet by mouth 3 (three) times daily as needed for moderate pain.  Marland Kitchen losartan (COZAAR) 50 MG tablet Take 1 tablet (50 mg total) by mouth daily.  . pantoprazole (PROTONIX) 20 MG tablet Take 20 mg by mouth 2 (two) times daily.  . predniSONE (DELTASONE) 5 MG tablet Take 5 mg by mouth daily with breakfast.  . butalbital-acetaminophen-caffeine (FIORICET, ESGIC) 50-325-40 MG tablet Take 1 tablet by mouth every 6 (six) hours as needed for migraine.   . clopidogrel (PLAVIX) 75 MG tablet Take 1 tablet (75 mg total) by mouth  daily. (Patient not taking: Reported on 12/22/2018)  . doxycycline (VIBRAMYCIN) 100 MG capsule Take 1 capsule (100 mg total) by mouth 2 (two) times daily.  . feeding supplement, ENSURE ENLIVE, (ENSURE ENLIVE) LIQD Take 237 mLs by mouth 3 (three) times daily between meals.  . Fluticasone-Salmeterol (ADVAIR DISKUS) 250-50 MCG/DOSE AEPB Inhale 1 puff into the lungs 2 (two) times daily.   . hydroxychloroquine (PLAQUENIL) 200 MG tablet Take 400 mg by mouth daily.  Marland Kitchen lidocaine (LIDODERM) 5 % Place 1 patch onto the skin daily. Remove & Discard patch within 12 hours or as directed by MD  . nitroGLYCERIN (NITROSTAT) 0.4 MG SL tablet Place 1 tablet (0.4 mg total) under the tongue every 5 (five) minutes as needed for chest pain.  . predniSONE (DELTASONE) 10 MG tablet Take 1 tablet (10 mg total) by mouth daily with breakfast. 60 mg PO (ORAL) x  2 days 50 mg PO (ORAL)  x 2 days 40 mg PO (ORAL)  x 2 days 30 mg PO  (ORAL)  x 2 days 20 mg PO  (ORAL) x 2 days 10 mg PO  (ORAL) x 2 days then stop   No facility-administered encounter medications on file as of 12/22/2018.     PHYSICAL EXAM:   General: NAD, frail, ill appearing, very thin Cardiovascular: regular rate and rhythm Pulmonary: right upper lobes clear, faint expiratory wheeze left lung field, bases diminished Abdomen: soft, nontender, + bowel sounds GU: no suprapubic tenderness Extremities: no edema, no joint deformities Skin: no rashes Neurological: Weakness but otherwise nonfocal  Ezekiel Slocumb, NP

## 2019-01-17 DEATH — deceased
